# Patient Record
Sex: Male | Born: 1960
Health system: Southern US, Community
[De-identification: ages and names within clinical notes are randomized; demographics above are authoritative.]

## PROBLEM LIST (undated history)

## (undated) DIAGNOSIS — I82409 Acute embolism and thrombosis of unspecified deep veins of unspecified lower extremity: Secondary | ICD-10-CM

## (undated) DIAGNOSIS — E039 Hypothyroidism, unspecified: Secondary | ICD-10-CM

## (undated) DIAGNOSIS — E669 Obesity, unspecified: Secondary | ICD-10-CM

## (undated) DIAGNOSIS — G4730 Sleep apnea, unspecified: Secondary | ICD-10-CM

## (undated) DIAGNOSIS — M1990 Unspecified osteoarthritis, unspecified site: Secondary | ICD-10-CM

## (undated) DIAGNOSIS — N189 Chronic kidney disease, unspecified: Secondary | ICD-10-CM

## (undated) DIAGNOSIS — I480 Paroxysmal atrial fibrillation: Secondary | ICD-10-CM

## (undated) DIAGNOSIS — R112 Nausea with vomiting, unspecified: Secondary | ICD-10-CM

## (undated) DIAGNOSIS — I2699 Other pulmonary embolism without acute cor pulmonale: Secondary | ICD-10-CM

## (undated) DIAGNOSIS — A6920 Lyme disease, unspecified: Secondary | ICD-10-CM

## (undated) DIAGNOSIS — Z9889 Other specified postprocedural states: Secondary | ICD-10-CM

## (undated) DIAGNOSIS — E785 Hyperlipidemia, unspecified: Secondary | ICD-10-CM

## (undated) DIAGNOSIS — D759 Disease of blood and blood-forming organs, unspecified: Secondary | ICD-10-CM

## (undated) DIAGNOSIS — K219 Gastro-esophageal reflux disease without esophagitis: Secondary | ICD-10-CM

## (undated) DIAGNOSIS — F439 Reaction to severe stress, unspecified: Secondary | ICD-10-CM

## (undated) DIAGNOSIS — Z8709 Personal history of other diseases of the respiratory system: Secondary | ICD-10-CM

## (undated) DIAGNOSIS — D6859 Other primary thrombophilia: Secondary | ICD-10-CM

## (undated) DIAGNOSIS — I10 Essential (primary) hypertension: Secondary | ICD-10-CM

## (undated) DIAGNOSIS — I499 Cardiac arrhythmia, unspecified: Secondary | ICD-10-CM

## (undated) DIAGNOSIS — J189 Pneumonia, unspecified organism: Secondary | ICD-10-CM

## (undated) DIAGNOSIS — I5189 Other ill-defined heart diseases: Secondary | ICD-10-CM

## (undated) HISTORY — PX: CARDIOVERSION: SHX1299

## (undated) HISTORY — DX: Reaction to severe stress, unspecified: F43.9

## (undated) HISTORY — DX: Other ill-defined heart diseases: I51.89

## (undated) HISTORY — PX: SHOULDER SURGERY: SHX246

## (undated) HISTORY — DX: Obesity, unspecified: E66.9

## (undated) HISTORY — DX: Hyperlipidemia, unspecified: E78.5

## (undated) HISTORY — PX: WISDOM TOOTH EXTRACTION: SHX21

## (undated) HISTORY — DX: Essential (primary) hypertension: I10

## (undated) HISTORY — DX: Paroxysmal atrial fibrillation: I48.0

---

## 1990-01-17 HISTORY — PX: INGUINAL HERNIA REPAIR: SUR1180

## 1998-09-18 DIAGNOSIS — I82409 Acute embolism and thrombosis of unspecified deep veins of unspecified lower extremity: Secondary | ICD-10-CM

## 1998-09-18 HISTORY — DX: Acute embolism and thrombosis of unspecified deep veins of unspecified lower extremity: I82.409

## 2001-03-31 ENCOUNTER — Observation Stay (HOSPITAL_COMMUNITY): Admission: EM | Admit: 2001-03-31 | Discharge: 2001-04-01 | Payer: Self-pay | Admitting: Emergency Medicine

## 2001-03-31 ENCOUNTER — Encounter: Payer: Self-pay | Admitting: Cardiology

## 2002-09-06 ENCOUNTER — Ambulatory Visit (HOSPITAL_COMMUNITY): Admission: RE | Admit: 2002-09-06 | Discharge: 2002-09-06 | Payer: Self-pay | Admitting: Orthopedic Surgery

## 2002-09-06 ENCOUNTER — Encounter: Payer: Self-pay | Admitting: Orthopedic Surgery

## 2004-01-25 ENCOUNTER — Ambulatory Visit (HOSPITAL_COMMUNITY): Admission: RE | Admit: 2004-01-25 | Discharge: 2004-01-25 | Payer: Self-pay | Admitting: Family Medicine

## 2004-07-08 ENCOUNTER — Inpatient Hospital Stay (HOSPITAL_COMMUNITY): Admission: AD | Admit: 2004-07-08 | Discharge: 2004-07-10 | Payer: Self-pay | Admitting: Cardiology

## 2004-07-09 ENCOUNTER — Encounter (INDEPENDENT_AMBULATORY_CARE_PROVIDER_SITE_OTHER): Payer: Self-pay | Admitting: *Deleted

## 2005-11-06 ENCOUNTER — Ambulatory Visit (HOSPITAL_COMMUNITY): Admission: RE | Admit: 2005-11-06 | Discharge: 2005-11-06 | Payer: Self-pay | Admitting: *Deleted

## 2005-11-06 ENCOUNTER — Encounter: Payer: Self-pay | Admitting: Vascular Surgery

## 2005-11-09 ENCOUNTER — Ambulatory Visit: Admission: RE | Admit: 2005-11-09 | Discharge: 2005-11-09 | Payer: Self-pay | Admitting: Family Medicine

## 2005-11-09 ENCOUNTER — Encounter: Payer: Self-pay | Admitting: Vascular Surgery

## 2006-07-24 ENCOUNTER — Encounter: Admission: RE | Admit: 2006-07-24 | Discharge: 2006-07-24 | Payer: Self-pay | Admitting: Family Medicine

## 2006-08-22 ENCOUNTER — Ambulatory Visit: Payer: Self-pay | Admitting: Vascular Surgery

## 2006-08-25 ENCOUNTER — Ambulatory Visit: Payer: Self-pay | Admitting: Vascular Surgery

## 2006-12-29 ENCOUNTER — Encounter: Admission: RE | Admit: 2006-12-29 | Discharge: 2006-12-29 | Payer: Self-pay | Admitting: Family Medicine

## 2007-05-23 ENCOUNTER — Ambulatory Visit (HOSPITAL_COMMUNITY): Admission: RE | Admit: 2007-05-23 | Discharge: 2007-05-23 | Payer: Self-pay | Admitting: Orthopedic Surgery

## 2009-01-17 HISTORY — PX: DISTAL BICEPS TENDON REPAIR: SHX1461

## 2009-03-29 ENCOUNTER — Ambulatory Visit (HOSPITAL_COMMUNITY): Admission: EM | Admit: 2009-03-29 | Discharge: 2009-03-29 | Payer: Self-pay | Admitting: Emergency Medicine

## 2009-07-17 HISTORY — PX: CARDIOVASCULAR STRESS TEST: SHX262

## 2009-10-23 ENCOUNTER — Ambulatory Visit: Payer: Self-pay | Admitting: Cardiology

## 2009-11-20 ENCOUNTER — Ambulatory Visit: Payer: Self-pay | Admitting: Cardiology

## 2010-02-07 ENCOUNTER — Encounter: Payer: Self-pay | Admitting: Family Medicine

## 2010-02-17 ENCOUNTER — Ambulatory Visit (INDEPENDENT_AMBULATORY_CARE_PROVIDER_SITE_OTHER): Payer: BC Managed Care – PPO | Admitting: *Deleted

## 2010-02-17 DIAGNOSIS — I4891 Unspecified atrial fibrillation: Secondary | ICD-10-CM

## 2010-03-05 ENCOUNTER — Ambulatory Visit: Payer: BC Managed Care – PPO | Admitting: Cardiology

## 2010-03-05 ENCOUNTER — Ambulatory Visit (HOSPITAL_COMMUNITY): Payer: BC Managed Care – PPO | Attending: Cardiology

## 2010-03-05 DIAGNOSIS — R42 Dizziness and giddiness: Secondary | ICD-10-CM

## 2010-03-05 DIAGNOSIS — I10 Essential (primary) hypertension: Secondary | ICD-10-CM | POA: Insufficient documentation

## 2010-03-05 DIAGNOSIS — E785 Hyperlipidemia, unspecified: Secondary | ICD-10-CM | POA: Insufficient documentation

## 2010-03-05 DIAGNOSIS — I4891 Unspecified atrial fibrillation: Secondary | ICD-10-CM

## 2010-03-05 HISTORY — PX: TRANSTHORACIC ECHOCARDIOGRAM: SHX275

## 2010-03-26 ENCOUNTER — Ambulatory Visit (INDEPENDENT_AMBULATORY_CARE_PROVIDER_SITE_OTHER): Payer: BC Managed Care – PPO | Admitting: Cardiology

## 2010-03-26 ENCOUNTER — Other Ambulatory Visit: Payer: Self-pay | Admitting: Cardiology

## 2010-03-26 DIAGNOSIS — Z79899 Other long term (current) drug therapy: Secondary | ICD-10-CM

## 2010-03-26 DIAGNOSIS — I4891 Unspecified atrial fibrillation: Secondary | ICD-10-CM

## 2010-03-26 DIAGNOSIS — E789 Disorder of lipoprotein metabolism, unspecified: Secondary | ICD-10-CM

## 2010-03-26 LAB — COMPREHENSIVE METABOLIC PANEL
ALT: 33 U/L (ref 0–53)
Albumin: 4.5 g/dL (ref 3.5–5.2)
CO2: 28 mEq/L (ref 19–32)
Chloride: 101 mEq/L (ref 96–112)
Creat: 1.49 mg/dL (ref 0.40–1.50)
Sodium: 139 mEq/L (ref 135–145)
Total Bilirubin: 0.8 mg/dL (ref 0.3–1.2)

## 2010-03-26 LAB — LIPID PANEL
Cholesterol: 194 mg/dL (ref 0–200)
LDL Cholesterol: 135 mg/dL — ABNORMAL HIGH (ref 0–99)

## 2010-03-26 LAB — T4, FREE: Free T4: 1.4 ng/dL (ref 0.80–1.80)

## 2010-03-26 LAB — TSH: TSH: 3.463 u[IU]/mL (ref 0.350–4.500)

## 2010-04-11 LAB — CBC
HCT: 48.8 % (ref 39.0–52.0)
MCV: 89.7 fL (ref 78.0–100.0)
RDW: 14.3 % (ref 11.5–15.5)

## 2010-04-11 LAB — DIFFERENTIAL
Eosinophils Absolute: 0.1 10*3/uL (ref 0.0–0.7)
Lymphocytes Relative: 24 % (ref 12–46)
Lymphs Abs: 1.6 10*3/uL (ref 0.7–4.0)
Monocytes Absolute: 0.6 10*3/uL (ref 0.1–1.0)

## 2010-04-11 LAB — BASIC METABOLIC PANEL
Creatinine, Ser: 1.23 mg/dL (ref 0.4–1.5)
Glucose, Bld: 100 mg/dL — ABNORMAL HIGH (ref 70–99)
Potassium: 4.1 mEq/L (ref 3.5–5.1)
Sodium: 133 mEq/L — ABNORMAL LOW (ref 135–145)

## 2010-04-27 ENCOUNTER — Other Ambulatory Visit: Payer: Self-pay | Admitting: *Deleted

## 2010-04-27 MED ORDER — OMEGA-3-ACID ETHYL ESTERS 1 G PO CAPS: 2.0000 g | ORAL_CAPSULE | Freq: Two times a day (BID) | ORAL | Status: DC

## 2010-05-21 ENCOUNTER — Other Ambulatory Visit: Payer: Self-pay | Admitting: General Surgery

## 2010-05-21 ENCOUNTER — Ambulatory Visit (HOSPITAL_COMMUNITY)
Admission: RE | Admit: 2010-05-21 | Discharge: 2010-05-21 | Disposition: A | Discharge status: Home / Self Care | Payer: BC Managed Care – PPO | Source: Ambulatory Visit | Attending: General Surgery | Admitting: General Surgery

## 2010-05-21 ENCOUNTER — Encounter (HOSPITAL_COMMUNITY): Payer: BC Managed Care – PPO

## 2010-05-21 ENCOUNTER — Other Ambulatory Visit (HOSPITAL_COMMUNITY): Payer: Self-pay | Admitting: General Surgery

## 2010-05-21 DIAGNOSIS — Z01818 Encounter for other preprocedural examination: Secondary | ICD-10-CM | POA: Insufficient documentation

## 2010-05-21 DIAGNOSIS — I10 Essential (primary) hypertension: Secondary | ICD-10-CM

## 2010-05-21 DIAGNOSIS — Z01812 Encounter for preprocedural laboratory examination: Secondary | ICD-10-CM | POA: Insufficient documentation

## 2010-05-21 DIAGNOSIS — K4090 Unilateral inguinal hernia, without obstruction or gangrene, not specified as recurrent: Secondary | ICD-10-CM | POA: Insufficient documentation

## 2010-05-21 LAB — COMPREHENSIVE METABOLIC PANEL
ALT: 35 U/L (ref 0–53)
AST: 36 U/L (ref 0–37)
Alkaline Phosphatase: 67 U/L (ref 39–117)
BUN: 30 mg/dL — ABNORMAL HIGH (ref 6–23)
CO2: 29 mEq/L (ref 19–32)
Creatinine, Ser: 1.47 mg/dL (ref 0.4–1.5)
GFR calc Af Amer: >60 mL/min (ref >60)
Glucose, Bld: 93 mg/dL (ref 70–99)
Total Bilirubin: 0.5 mg/dL (ref 0.3–1.2)

## 2010-05-21 LAB — CBC
HCT: 50.1 % (ref 39.0–52.0)
Hemoglobin: 16.7 g/dL (ref 13.0–17.0)
MCHC: 33.3 g/dL (ref 30.0–36.0)
MCV: 86.7 fL (ref 78.0–100.0)
RBC: 5.78 MIL/uL (ref 4.22–5.81)
RDW: 14.2 % (ref 11.5–15.5)
WBC: 4.4 10*3/uL (ref 4.0–10.5)

## 2010-05-21 LAB — DIFFERENTIAL
Lymphocytes Relative: 38 % (ref 12–46)
Monocytes Absolute: 0.4 10*3/uL (ref 0.1–1.0)
Neutro Abs: 2.1 10*3/uL (ref 1.7–7.7)

## 2010-05-21 LAB — SURGICAL PCR SCREEN
MRSA, PCR: NEGATIVE
Staphylococcus aureus: NEGATIVE

## 2010-05-21 LAB — PROTIME-INR: INR: 1.05 (ref 0.00–1.49)

## 2010-05-25 ENCOUNTER — Ambulatory Visit (HOSPITAL_COMMUNITY)
Admission: RE | Admit: 2010-05-25 | Discharge: 2010-05-25 | Disposition: A | Discharge status: Home / Self Care | Payer: BC Managed Care – PPO | Source: Ambulatory Visit | Attending: General Surgery | Admitting: General Surgery

## 2010-05-25 DIAGNOSIS — I4891 Unspecified atrial fibrillation: Secondary | ICD-10-CM | POA: Insufficient documentation

## 2010-05-25 DIAGNOSIS — I10 Essential (primary) hypertension: Secondary | ICD-10-CM | POA: Insufficient documentation

## 2010-05-25 DIAGNOSIS — K4090 Unilateral inguinal hernia, without obstruction or gangrene, not specified as recurrent: Secondary | ICD-10-CM | POA: Insufficient documentation

## 2010-05-26 NOTE — Op Note (Signed)
NAME:  Isaiah [name], Isaiah [name] NO.:  [id number]  MEDICAL RECORD NO.:  [id number]           PATIENT TYPE:  O  LOCATION:  DAYL                         FACILITY:  [facility]  PHYSICIAN:  Isaiah [name], M.D.DATE OF BIRTH:  October 03, 1960  DATE OF PROCEDURE:  05/25/2010 DATE OF DISCHARGE:                              OPERATIVE REPORT   PREOPERATIVE DIAGNOSIS:  Left inguinal hernia.  POSTOPERATIVE DIAGNOSIS:  Indirect left inguinal hernia.  PROCEDURE:  Laparoscopic left inguinal hernia repair with mesh (Parietex 6 inch x 6 inch).  SURGEON:  Isaiah [name], M.D.  ANESTHESIA:  General.  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  He came in feeling he had a left inguinal hernia, by exam indeed he does, and now he presents for left inguinal hernia repair with mesh.  Procedure risks and aftercare were discussed with him preoperatively.  Also, we discussed him withholding any blood thinners he is on.  TECHNIQUE:  He was seen in the holding area and the left groin marked with my initials.  He then voided and was brought to the operating room, placed supine on the operating table and general anesthetic was administered.  The hair in the lower abdominal wall and groin was clipped and the area was sterilely prepped and draped.  Marcaine was infiltrated in the subumbilical region.  A subumbilical incision was made through the skin and subcutaneous tissue.  Using blunt dissection, I identified the left anterior rectus sheath and made a small incision in it.  The underlying left rectus muscle was swept laterally exposing the posterior rectus sheath.  A balloon dissection device was then placed in the extraperitoneal space in the lower midline and left extraperitoneal space was dissected using this device under laparoscopic vision.  The balloon was then  removed and CO2 gas was insufflated creating a working space.  Just to the right of lower midline, two 5-mm trocars were placed.  Using blunt dissection, I identified the symphysis pubis and Cooper's ligament and the direct space on the left side.  I then dissected fibrofatty tissue free from the anterior and lateral abdominal walls and the left extraperitoneal space packed above the level of the umbilicus. I isolated the spermatic cord and noted indirect hernia present through a patulous internal ring.  I reduced large amount of extraperitoneal fat that was out into the hernia.  I then created a window around the spermatic cord.  I stripped the peritoneal sac on the spermatic cord back to the level of the umbilicus.  Following this, I brought the Parietex mesh into the field and cut it to be 5 inches x 6 inches.  A partial longitudinal slit was cut into it. It was then placed into the left extraperitoneal space and then deployed and positioned so that two tails were wrapped around the spermatic cord. Using absorbable tacks, it was anchored to the area just above the Cooper ligament, the anterior and lateral abdominal walls with good fixation.  This provided  for good coverage with a good overlap of the direct, indirect femoral spaces.  Following this, I inspected the area.  No bleeding was noted.  I held the inferolateral aspect of mesh down and then released the CO2 gas watching the extraperitoneal contents approximate the mesh.  All instruments and trocars were then removed.  Marcaine was infiltrated to the extraperitoneal space.  Following this, left anterior rectus sheath defect was closed with interrupted 0 Vicryl sutures.  All skin incisions were closed with 4-0 Monocryl subcuticular stitches.  Steri-Strips and sterile dressings were applied.  He tolerated the procedure without any apparent complications and was taken to recovery in satisfactory condition.  He will be given  some Percocet for pain and discharge instructions and followup in the office in 2-3 weeks.     Isaiah [name], M.D.     Kari Baars  D:  05/25/2010  T:  05/25/2010  Job:  [id number]  cc:   Cassell Clement, M.D. Fax: [phone]  Bryan Lemma. Manus Gunning, M.D. Fax: [phone]  Electronically Signed by Avel Peace M.D. on 05/26/2010 10:40:47 AM

## 2010-06-01 NOTE — Procedures (Signed)
DUPLEX DEEP VENOUS EXAM - LOWER EXTREMITY   INDICATION:  Left leg pain and swelling.  History of superficial  thrombosis.   HISTORY:  Edema:  Yes  Trauma/Surgery:  No  Pain:  Yes  PE:  No  Previous DVT:  No  Anticoagulants:  Other:   DUPLEX EXAM:                CFV   SFV   PopV  PTV    GSV                R  L  R  L  R  L  R   L  R  L  Thrombosis    0  +  +  +  0  +  0  Spontaneous  Phasic  Augmentation  Compressible  Competent   Legend:  + - yes  o - no  p - partial  D - decreased   IMPRESSION:  Acute free-floating deep vein thrombosis noted in the left  common femoral vein.  Thrombosis noted in the left greater saphenous and  lesser saphenous veins.   _____________________________  [name]. Edilia Bo, M.D.   MG/MEDQ  D:  08/22/2006  T:  08/23/2006  Job:  [id number]

## 2010-06-01 NOTE — Consult Note (Signed)
NEW PATIENT CONSULTATION   Isaiah [name], Isaiah [name]  DOB:  1960/09/29                                       08/22/2006  [id number]#:[id number]   VASCULAR SURGERY CONSULTATION   REASON FOR CONSULTATION:  I saw Isaiah [name] in the office today as an  add-on consult with a clot in his left greater saphenous vein extending  to his left common femoral vein.  He was referred by Dr. Patty Sermons.  This is a pleasant 50 year old gentleman who has had two previous  episodes of phlebitis involving the distal leg.  In October of 2007 he  had phlebitis in the left ankle and then in June of 2008 had phlebitis  of the right ankle.  He has had no previous history of DVT.  He  developed left leg pain last Thursday and this persisted.  He spoke to  Dr. Yevonne Pax office today who arranged for venous duplex of the left  leg today.  The left leg venous duplex demonstrated clot involving the  entire left greater saphenous vein with some clot extending into the  common femoral vein.  I was consulted for further recommendations  concerning the clot.  The patient denies any chest pain or shortness of  breath.  He has had some mild left leg swelling.  His main complaint has  been pain along the medial aspect of his left leg.  He has had no  symptoms in the right leg.   PAST MEDICAL HISTORY:  His past medical history is significant for  atrial fibrillation and he is followed by Dr. Patty Sermons.  He has not  been on Coumadin.  He was also apparently recently diagnosed with  hyperthyroidism and is followed by Dr. Evlyn Kanner.  In addition, he does have  hypercholesterolemia.  He has no history of diabetes, hypertension,  history of previous myocardial infarction, history of congestive heart  failure or history of COPD.   PRIMARY CARE PHYSICIANS:  Bryan Lemma. Manus Gunning, M.D. and Donia Guiles,  M.D.   FAMILY HISTORY:  There is no history of premature cardiovascular  disease.  There is no history of clotting  disorders that he is aware of.   SOCIAL HISTORY:  He is married and has two children.  He works as a  Education officer, community. He does not do a lot of standing.  He is either walking between  rooms or sitting.  He does not smoke cigarettes.   REVIEW OF SYSTEMS AND MEDICATIONS:  Are documented on the medical  history form in his chart.   PHYSICAL EXAMINATION:  Vital signs:  Blood pressure 145/75.  Heart rate  is 75.  Neck:  I do not detect any carotid bruits.  Lungs:  Clear  bilaterally to auscultation.  Cardiac:  On exam he has a regular rate  and rhythm.  Abdomen:  Soft and nontender.  Extremities: He has palpable  femoral, popliteal and pedal pulses bilaterally.  He has mild left lower  extremity swelling.  He has some mild erythema along the medial aspect  of his left thigh with some induration along the medial aspect of his  thigh; also, to a lesser degree, in his calf.   CLINICAL DATA:  I did review his duplex scan and he does have clot  involving the left greater saphenous vein with a small amount of clot  extending into the common  femoral vein.  I looked at this with the tech  and on my interpretation, although there is some slight mobility to this  clot, it appears to be fairly well stuck to the common femoral vein.  There is good flow around the clot.   DISCUSSION:  I think given the propagation of the clot in the greater  saphenous vein he clearly needs to be on Coumadin and I have written him  a prescription for Lovenox to bridge the Coumadin.  I have discussed  this with Dr. Manus Gunning, who will call him tomorrow to arrange for  followup Pro Time/INR to help get him converted to Coumadin.  I believe  Dr. Manus Gunning has previously performed a hypercoagulable work up.  I have  also arranged for a followup duplex scan on Friday to be sure there is  no evidence of propagation of the clot.  The only consideration would be  whether or not to place an IVC filter.  I explained to him that there is   really no right answer here.  The risks of not placing a filter is the  small chance that this clot could break loose and he could have a  pulmonary embolus.  Again, on my interpretation, the clot looked  reasonably well stuck to the wall of the common femoral vein and it was  small enough that I did not think that this was would be a significant  PE, if it did break loose.  I have explained that the other option would  be to place an IVC filter, however, there was also some small risk  associated with this including the risk of PE around the filter, IVC  thrombosis and filter malposition. He is agreeable with plans to hold  off on the filter.   He will begin his Lovenox tonight and then subsequently will begin his  Coumadin.  He will have his INR managed by Dr. Manus Gunning and will have a  followup duplex scan on Friday.  In addition, I  have left a message with Dr. Rinaldo Cloud nurse, as he had discussed he might  have to adjust his thyroid medications if he was placed on Coumadin.   [name]. Edilia Bo, M.D.  Electronically Signed   CSD/MEDQ  D:  08/22/2006  T:  08/23/2006  Job:  [id number]   cc:   Bryan Lemma. Manus Gunning, M.D.  Tera Mater. Evlyn Kanner, M.D.  Cassell Clement, M.D.

## 2010-06-01 NOTE — Procedures (Signed)
DUPLEX DEEP VENOUS EXAM - LOWER EXTREMITY   INDICATION:  Follow up known DVT in the left leg.   HISTORY:  Edema:  Yes.  Trauma/Surgery:  No.  Pain:  Yes.  PE:  No.  Previous DVT:  Yes.  Anticoagulants:  Other:   DUPLEX EXAM:                CFV   SFV   PopV  PTV    GSV                R  L  R  L  R  L  R   L  R  L  Thrombosis    0  +     0  +  0  +   0  +  0  Spontaneous   0  0  0  0  0  0  0   0  0  0  Phasic        0  0  0  0  0  0  0   0  0  0  Augmentation  0  0  0  0  0  0  0   0  0  0  Compressible  0  0  0  0  0  0  0   0  0  0  Competent     0  0  0  0  0  0  0   0  0  0   Legend:  + - yes  o - no  p - partial  D - decreased   IMPRESSION:  Acute free-floating DVT noted in the left common femoral  vein.  Thrombosis noted in the left greater saphenous and lesser  saphenous vein.  Study unchanged from previous.   _____________________________  [name]. Edilia Bo, M.D.   MG/MEDQ  D:  08/25/2006  T:  08/26/2006  Job:  [id number]

## 2010-06-04 ENCOUNTER — Encounter (INDEPENDENT_AMBULATORY_CARE_PROVIDER_SITE_OTHER): Payer: Self-pay | Admitting: General Surgery

## 2010-06-04 NOTE — Discharge Summary (Signed)
NAME:  Isaiah [name], Isaiah [name] NO.:  [id number]   MEDICAL RECORD NO.:  [id number]          PATIENT TYPE:  INP   LOCATION:  4702                         FACILITY:  [facility]   PHYSICIAN:  Elmore Guise., M.D.DATE OF BIRTH:  Jan 09, 1961   DATE OF ADMISSION:  07/08/2004  DATE OF DISCHARGE:  07/10/2004                                 DISCHARGE SUMMARY   DISCHARGE DIAGNOSIS:  1.  Atrial fibrillation (status post transesophageal echocardiogram/direct      current cardioversion).  2.  Dyslipidemia.   HISTORY OF PRESENT ILLNESS:  The patient is very pleasant, 50 year old,  white male with past medical history of dyslipidemia, who was admitted on  July 08, 2004, because of recurrent atrial fibrillation.   HOSPITAL COURSE:  His hospital course was uncomplicated. He underwent  successful DC cardioversion on July 09, 2004, after TEE was performed. TEE  showed mild mitral regurgitation with normal LV systolic function and EF of  50-55%. Normal atrial sizes was noted. He has now been in normal sinus  rhythm for the last 24 hours and will be discharged home today. His INR on  discharge is 2.1. Prior to his discharge, he did have IV infiltration in his  right hand. His IV was removed and ice pack was placed with improvement in  his symptoms. There is no evidence of infection there.   DISCHARGE MEDICATIONS:  1.  Tylenol or ibuprofen as needed for his pain.  2.  He will take Toprol XL 25 mg daily.  3.  Zetia 10 mg daily.  4.  Xanax 0.25 mg q.6h. p.r.n.  5.  Coumadin 5 mg daily.  6.  Amiodarone 200 mg twice a day for two weeks and then 200 mg once a day.   I did ask him to use warm compresses to his right hand to help with pain. He  will return to Dr. Ronny Flurry in one week for routine office visit and  PT/INR. He will notify the office should he have any further problems or  concerns.  A telephone number was given. We did discuss that he would need his Coumadin  for least four  to six weeks following his successful cardioversion. Once he  is off amiodarone, he would be a good candidate for pill in the pocket  treatment.       TWK/MEDQ  D:  07/10/2004  T:  07/11/2004  Job:  [id number]   cc:   Cassell Clement, M.D.  [address]  [place]  [place] [address]  Fax: [phone]

## 2010-06-04 NOTE — H&P (Signed)
[facility]  Patient:    Isaiah [name], Isaiah [name] Visit Number: [id number] MRN: [id number]          Service Type: MED Location: 1800 [id number] [id number] Attending Physician:  Hanley Seamen Dictated by:   Clovis Pu Patty Sermons, M.D. Admit Date:  03/31/2001   CC:         Desma Maxim, M.D.   History and Physical  CHIEF COMPLAINT:  Atrial fibrillation.  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 He has no prior history of any known arrhythmias.  The palpitations were barely noticeable and really did not cause him any other symptoms.  This morning, he got up and worked out with his weights in his home gymnasium and did not have any adverse effects from that.  However, he continued to be aware that his pulse was a little irregular and he asked his wife to count his pulse at the carotid and she confirmed that it was quite irregular.  The patient then came to the emergency room for further evaluation.  He has not had any definite chest pain.  There is just a sense of uneasiness in his chest.  He has not been short of breath.  He has been in excellent health otherwise.  He takes no prescription medications.  He sees Dr. Donia Guiles basically for annual physicals.  The patient, although he takes no prescription medications, does take some over-the-counter medications including creatine and vitamin supplements and he does take a baby aspirin per day.  FAMILY HISTORY:  Both parents are living and well.  There is no family history of atrial fibrillation.  His maternal grandfather died at age 44 of a massive heart attack.  The patients mother has high blood pressure, but no heart problems that they know of.  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  The patient does not use any tobacco  products. He will drink an occasional cocktail and last night had two margaritas.  The patient is married and has a 21-month-old daughter and a 38-year-old daughter.  PAST SURGICAL HISTORY:  Inguinal herniorrhaphy in 1992.  ALLERGIES:  No known drug allergies.  REVIEW OF SYSTEMS:  GASTROINTESTINAL:  Negative.  GENITOURINARY:  Negative. RESPIRATORY:  Negative.  NEUROLOGIC:  No history of TIA or strokes and no headaches or dizziness.  The remainder of review of systems is negative in detail.  PHYSICAL EXAMINATION:  VITAL SIGNS:  Stable with pulse 100 in atrial fibrillation, respiratory rate normal.  HEENT:  Negative.  Pupils are equal, round and reactive to light and accommodation.  Extraocular movements full.  Sclerae clear.  Mouth and pharynx are normal.  NECK:  Carotid normal.  Thyroid normal.  No lymphadenopathy.  CHEST:  Clear to percussion and auscultation.  HEART:  No murmur, gallop, rub or click.  Precordium is quiet.  ABDOMEN:  Soft without hepatosplenomegaly or masses.  GENITALIA:  Not examined.  EXTREMITIES:  Good pulses with no edema.  LABORATORY DATA AND X-RAY FINDINGS:  Atrial fibrillation with a rapid ventricular response and a good initial response to IV Cardizem.  DISPOSITION:  Await results of chest x-ray and labs.  Will check him especially for thyrotoxicosis.  IMPRESSION:  New-onset of atrial fibrillation, uncertain etiology, doubt coronary artery disease.  PLAN: 1. Admit to telemetry. 2. We will continue low-dose  intravenous Cardizem and also add oral Lopressor. 3. We will given him Lovenox and we will continue with his daily aspirin. 4. Serial enzymes will be obtained to be sure that he did not have a silent    myocardial infarction. Dictated by:   Clovis Pu Patty Sermons, M.D. Attending Physician:  Hanley Seamen DD:  03/31/01 TD:  03/31/01 Job: [id number]

## 2010-06-04 NOTE — H&P (Signed)
NAME:  Isaiah [name], Isaiah [name] NO.:  [id number]   MEDICAL RECORD NO.:  [id number]          PATIENT TYPE:  INP   LOCATION:  4702                         FACILITY:  [facility]   PHYSICIAN:  Cassell Clement, M.D. DATE OF BIRTH:  08-16-1960   DATE OF ADMISSION:  07/08/2004  DATE OF DISCHARGE:                                HISTORY & PHYSICAL   CHIEF COMPLAINT:  Paroxysmal atrial fibrillation.   HISTORY OF PRESENT ILLNESS:  This is a 50 year old married Caucasian  gentleman who is admitted because of new onset of paroxysmal atrial  fibrillation. We first saw this patient in March of 2003 when he presented  with palpitations and was found to be in atrial fibrillation with rapid  ventricular response and was [facility]. The patient  was admitted to telemetry and was given a low dose intravenous Cardizem and  placed on beta blockers and Lovenox with anticipation of possible  cardioversion but converted overnight and was able to be discharged improved  the next day. He is remained on Toprol. He had normal cardiac enzymes during  that admission and was discharged with instructions to avoid caffeine and  limit alcohol. He had a subsequent treadmill Cardiolite stress test in  October 2003 showing no evidence of ischemia and showing ejection fraction  of 61%. The patient had a echocardiogram April 13, 2001 which was a normal  study with normal LV function. His most recent echocardiogram was done Jun 11, 2004 showing ejection fraction of 55%, left atrial size 32 mm which is  normal and showing normal systolic and diastolic function, mild mitral  regurgitation, normal pulmonary artery pressure. Approximately a month ago,  the patient was in [facility] and went into atrial fibrillation and was  hospitalized briefly in [place] and then released and came back to  [place]. When returning to [facility], he was still in atrial  fibrillation but reverted after  the next several days. He was not  anticoagulated at that time. Digoxin was added to his regimen, but he did  not tolerate it because it caused the forceful heartbeat which was  uncomfortable for him. Recently, he had been on 50 milligrams Toprol twice a  day, aspirin 325 milligrams daily and multivitamin as well as Zetia 10  milligrams daily. Two days ago on July 06, 2004, the patient noted symptoms  on the morning of July 06, 2004 consistent with recurrent atrial  fibrillation. He was instructed to take additional propranolol at home every  4 hours until he converted which he took but failed to convert and was seen  in our office on July 07, 2004 at which time he was still in atrial  fibrillation with a rate of 100 and he was given Cardizem LA 360 to take  once a day and was also started on Coumadin. He was instructed that if he  had not converted by this morning he was notify us which he did and  therefore is now admitted for inpatient management including Lovenox  bridging to Coumadin and IV amiodarone.   FAMILY HISTORY:  His family history is notable in that his mother is living  at 90. Father living at 87 and in good health. He did have a maternal  grandfather died of the acute massive heart attack at age 24.   SOCIAL HISTORY:  He is married. 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 He has to daughters ages four and 71. The patient has never  smoked. He drinks very little alcohol, usually just some beers on a week and  occasionally in the evening.   ALLERGIES:  He had developed a marked elevation of liver function tests to  CRESTOR and he developed myalgias on LIPITOR.   REVIEW OF SYSTEMS:  GENERAL:  Reveals has not had any change in bowel  habits, hematochezia, or melena. Denies any genitourinary symptoms.  RESPIRATORY:  Reveals that he is had a history of having had a bad cold  about two months ago which finally cleared. Remainder of review of systems  is  negative in detail. As noted, he does have a history of  hypercholesterolemia. The patient tries to get regular exercise. 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 Previously, he had been  concentrating on isometric body building exercises but now he is also trying  to focus more on cardiovascular and aerobic exercise as well.   PHYSICAL EXAMINATION:  VITAL SIGNS:  His blood pressure is 109/70, pulse is  80 and irregularly irregular, respirations 16, temperature 97.  GENERAL APPEARANCE:  Well-developed, well-nourished gentleman in no  distress.  SKIN:  The skin is clear.  HEENT:  The pupils were equal and reactive. Extraocular movements are full.  The mouth and pharynx are normal.  NECK:  The carotids have normal upstroke. The jugular venous pressure is  normal. Thyroid is normal. There is no lymphadenopathy.  CHEST:  The chest is clear to percussion and auscultation.  HEART:  Reveals a quiet precordium without murmur, gallop or click.  ABDOMEN:  Abdomen is soft without hepatosplenomegaly or masses.  EXTREMITIES:  Show good peripheral pulses. No edema or phlebitis.   IMPRESSION:  1.  Paroxysmal atrial fibrillation, recurrent.  2.  History of hypercholesterolemia.   DISPOSITION:  We will continue his Toprol for rate control and we are  starting IV amiodarone and starting Lovenox until his Coumadin is  therapeutic. He is scheduled for TEE cardioversion at 12 noon tomorrow by  Dr. Lady Deutscher.       TB/MEDQ  D:  07/08/2004  T:  07/08/2004  Job:  [id number]

## 2010-06-04 NOTE — Discharge Summary (Signed)
[place]  Patient:    RALLY, OUCH Visit Number: [id number] MRN: [id number]          Service Type: MED Location: 2000 [id number] [id number] Attending Physician:  Rudean Hitt Dictated by:   Clovis Pu Patty Sermons, M.D. Admit Date:  03/31/2001 Discharge Date: 04/01/2001   CC:         Desma Maxim, M.D.   Discharge Summary  FINAL DIAGNOSIS:  Paroxysmal atrial fibrillation, resolved.  OPERATIONS PERFORMED:  None.  HISTORY:  This 50 year old married gentleman from [facility] noted onset of palpitations on the history of prior to admission.  There is no history of any known heart problems.  He has been in good health and sees Dr. Donia Guiles for physicals.  He takes no prescription medicines but has been on a lot of vitamin supplements and creatine.  FAMILY HISTORY:  Negative for atrial fibrillation.  SOCIAL HISTORY:  He drinks a little alcohol and does not use tobacco.  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  PHYSICAL EXAMINATION:  VITAL SIGNS:  Pulse 100 in atrial fibrillation.  HEAD AND NECK:  Negative.  HEART:  No murmur, gallop, or rub.  ABDOMEN:  Negative.  CHEST:  Clear.  EXTREMITIES:  No edema.  DIAGNOSTIC DATA:  Electrocardiogram showed atrial fibrillation with a rapid ventricular response and good initial response to IV Cardizem.  HOSPITAL COURSE:  He was admitted to telemetry.  He was placed on intravenous low-dose Cardizem drip and oral Lopressor.  He was given Lovenox prophylactically and continued on daily aspirin.  Serial enzymes were obtained to rule out a myocardial infarction.  By the next day, the patient had converted back to sinus rhythm.  He was feeling well.  His cardiac enzymes came back normal, and he was anxious to go home.  His total CKs were elevated secondary to creatine ingestion most likely, and his troponin Is were normal.  He was discharged home  with instructions to avoid caffeine and limit alcohol. He will be on Toprol XL 50 mg daily and an aspirin daily.  Will plan for an outpatient 2-D echocardiogram and outpatient Cardiolite stress test at a later date.  CONDITION UPON DISCHARGE:  Improved, satisfactory. Dictated by:   Clovis Pu Patty Sermons, M.D. Attending Physician:  Rudean Hitt DD:  04/27/01 TD:  04/29/01 Job: [id number]

## 2010-07-16 ENCOUNTER — Other Ambulatory Visit: Payer: Self-pay | Admitting: *Deleted

## 2010-07-16 DIAGNOSIS — E7800 Pure hypercholesterolemia, unspecified: Secondary | ICD-10-CM

## 2010-07-16 DIAGNOSIS — F419 Anxiety disorder, unspecified: Secondary | ICD-10-CM

## 2010-07-16 MED ORDER — EZETIMIBE 10 MG PO TABS: 10.0000 mg | ORAL_TABLET | Freq: Every day | ORAL | Status: DC

## 2010-07-16 MED ORDER — ALPRAZOLAM 0.25 MG PO TABS: 0.2500 mg | ORAL_TABLET | Freq: Every evening | ORAL | Status: DC | PRN

## 2010-07-16 NOTE — Telephone Encounter (Signed)
Refilled meds per fax request.  

## 2010-09-24 ENCOUNTER — Other Ambulatory Visit: Payer: Self-pay | Admitting: *Deleted

## 2010-09-24 DIAGNOSIS — E7800 Pure hypercholesterolemia, unspecified: Secondary | ICD-10-CM

## 2010-09-24 DIAGNOSIS — I4891 Unspecified atrial fibrillation: Secondary | ICD-10-CM

## 2010-09-24 DIAGNOSIS — Z79899 Other long term (current) drug therapy: Secondary | ICD-10-CM

## 2010-09-24 MED ORDER — METOPROLOL SUCCINATE ER 50 MG PO TB24: 50.0000 mg | ORAL_TABLET | Freq: Every day | ORAL | Status: DC

## 2010-09-24 NOTE — Telephone Encounter (Signed)
Refilled meds per fax request.  

## 2010-09-30 ENCOUNTER — Encounter: Payer: Self-pay | Admitting: Nurse Practitioner

## 2010-10-01 ENCOUNTER — Ambulatory Visit (INDEPENDENT_AMBULATORY_CARE_PROVIDER_SITE_OTHER): Payer: BC Managed Care – PPO | Admitting: Nurse Practitioner

## 2010-10-01 ENCOUNTER — Other Ambulatory Visit (INDEPENDENT_AMBULATORY_CARE_PROVIDER_SITE_OTHER): Payer: BC Managed Care – PPO | Admitting: *Deleted

## 2010-10-01 ENCOUNTER — Encounter: Payer: Self-pay | Admitting: Nurse Practitioner

## 2010-10-01 VITALS — BP 152/104 | HR 64 | Ht 75.5 in | Wt 246.2 lb

## 2010-10-01 DIAGNOSIS — Z79899 Other long term (current) drug therapy: Secondary | ICD-10-CM

## 2010-10-01 DIAGNOSIS — I10 Essential (primary) hypertension: Secondary | ICD-10-CM

## 2010-10-01 DIAGNOSIS — I4891 Unspecified atrial fibrillation: Secondary | ICD-10-CM

## 2010-10-01 DIAGNOSIS — E7800 Pure hypercholesterolemia, unspecified: Secondary | ICD-10-CM

## 2010-10-01 DIAGNOSIS — I480 Paroxysmal atrial fibrillation: Secondary | ICD-10-CM | POA: Insufficient documentation

## 2010-10-01 LAB — BASIC METABOLIC PANEL
CO2: 30 mEq/L (ref 19–32)
Glucose, Bld: 104 mg/dL — ABNORMAL HIGH (ref 70–99)
Sodium: 140 mEq/L (ref 135–145)

## 2010-10-01 LAB — LIPID PANEL
Cholesterol: 184 mg/dL (ref 0–200)
HDL: 53.4 mg/dL (ref >39.00)
LDL Cholesterol: 122 mg/dL — ABNORMAL HIGH (ref 0–99)
VLDL: 9 mg/dL (ref 0.0–40.0)

## 2010-10-01 LAB — HEPATIC FUNCTION PANEL
AST: 45 U/L — ABNORMAL HIGH (ref 0–37)
Alkaline Phosphatase: 62 U/L (ref 39–117)
Total Protein: 7.1 g/dL (ref 6.0–8.3)

## 2010-10-01 MED ORDER — LISINOPRIL 20 MG PO TABS: 20.0000 mg | ORAL_TABLET | Freq: Two times a day (BID) | ORAL | Status: DC

## 2010-10-01 MED ORDER — AMLODIPINE BESYLATE 5 MG PO TABS: 5.0000 mg | ORAL_TABLET | Freq: Every day | ORAL | Status: DC

## 2010-10-01 NOTE — Progress Notes (Signed)
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 Patty Sermons. He has known HTN and PAF. His blood pressure is up at home. He has been taking his Lisinopril two times a day and hasn't really seen any improvement. He is under a lot of stress with his divorce and custody issues. He is exercising. He is frustrated that he can't get his heart rate up with exercise but has no sexual side effects. No chest pain. No shortness of breath.  Current Outpatient Prescriptions on File Prior to Visit  Medication Sig Dispense Refill  . ALPRAZolam (XANAX) 0.25 MG tablet Take 0.25 mg by mouth 2 (two) times daily.        Marland Kitchen aspirin 325 MG tablet Take 325 mg by mouth daily.        Marland Kitchen ezetimibe (ZETIA) 10 MG tablet Take 1 tablet (10 mg total) by mouth daily.  30 tablet  11  . levothyroxine (SYNTHROID) 200 MCG tablet Take 200 mcg by mouth daily.        . metoprolol (TOPROL-XL) 50 MG 24 hr tablet Take 1 tablet (50 mg total) by mouth daily.  30 tablet  11  . Multiple Vitamin (MULTIVITAMIN) tablet Take 1 tablet by mouth daily.        Marland Kitchen omega-3 acid ethyl esters (LOVAZA) 1 G capsule Take 2 capsules (2 g total) by mouth 2 (two) times daily.  120 capsule  11  . propafenone (RYTHMOL SR) 325 MG 12 hr capsule Take 325 mg by mouth 2 (two) times daily.        Marland Kitchen DISCONTD: lisinopril (PRINIVIL,ZESTRIL) 20 MG tablet Take 20 mg by mouth daily.        Marland Kitchen ALPRAZolam (XANAX) 0.25 MG tablet Take 1 tablet (0.25 mg total) by mouth at bedtime as needed for sleep.  100 tablet  2    Allergies  Allergen Reactions  . Crestor (Rosuvastatin Calcium)   . Lipitor (Atorvastatin Calcium)     Past Medical History  Diagnosis Date  . PAF (paroxysmal atrial fibrillation)     no recurrence since Feb 2012  . Hypertension   . Thyroid disease   . Hyperlipidemia   . Situational stress   . Diastolic dysfunction     Grade I per echo in 2/12    Past  Surgical History  Procedure Date  . Inguinal hernia repair 1992  . Cardiovascular stress test 07/17/2009    EF 59%  . Transthoracic echocardiogram 03/05/2010    EF 60-65%    History  Smoking status  . Never Smoker   Smokeless tobacco  . Not on file    History  Alcohol Use  . Yes    4X WEEK    Family History  Problem Relation Age of Onset  . Hypertension Mother   . Hyperlipidemia Mother   . Heart attack Maternal Grandfather     Review of Systems: The review of systems is positive for situational stress. He does have some extra salt use.  All other systems were reviewed and are negative.  Physical Exam: BP 152/104  Pulse 64  Ht 6' 3.5" (1.918 m)  Wt 246 lb 3.2 oz (111.676 kg)  BMI 30.37 kg/m2 Patient is pleasant and in no acute distress. Skin is warm and dry. Color is normal.  HEENT is unremarkable. Normocephalic/atraumatic. PERRL. Sclera are nonicteric. Neck is supple. No masses. No JVD. Lungs  are clear. Cardiac exam shows a regular rate and rhythm. Abdomen is soft. Extremities are without edema. Gait and ROM are intact. No gross neurologic deficits noted.   LABORATORY DATA:   Assessment / Plan:

## 2010-10-01 NOTE — Patient Instructions (Signed)
Increase the Lisinopril to two times a day We are adding Norvasc 5 mg daily Both prescriptions are sent to the drug store I will see you in 3 weeks. Continue to monitor your blood pressure at home Call for any problems.

## 2010-10-01 NOTE — Assessment & Plan Note (Signed)
Blood pressure is up. He has already increased his Lisinopril to BID. We will add Norvasc 5 mg daily. He is wanting to get off Toprol but for now will need to continue. He would be a candidate for HCTZ. I will see him back in 3 weeks. He will continue to monitor at home. Patient is agreeable to this plan and will call if any problems develop in the interim.

## 2010-10-01 NOTE — Assessment & Plan Note (Signed)
He remains on Rythmol. No recurrence of his atrial fib.

## 2010-10-01 NOTE — Assessment & Plan Note (Signed)
No recurrence. 

## 2010-10-06 ENCOUNTER — Telehealth: Payer: Self-pay | Admitting: *Deleted

## 2010-10-06 NOTE — Telephone Encounter (Signed)
Advised of labs 

## 2010-10-06 NOTE — Telephone Encounter (Signed)
Message copied by Eugenia Pancoast on Wed Oct 06, 2010  5:24 PM ------      Message from: Cassell Clement      Created: Sat Oct 02, 2010  4:14 PM       Please report.A total cholesterol is 184 which is satisfactory and the LDL cholesterol is improved but still high at 122.  The triglycerides are normal at 45. The liver tests are normal except slight elevation of AST at 45.The serum creatinine is slightly high at 1.6.   The potassium is slightly elevated at 5.4.  Avoid high potassium foods.  Try to drink more water.  Continue prudent diet and same meds.

## 2010-10-21 ENCOUNTER — Other Ambulatory Visit: Payer: Self-pay | Admitting: *Deleted

## 2010-10-21 DIAGNOSIS — F419 Anxiety disorder, unspecified: Secondary | ICD-10-CM

## 2010-10-21 MED ORDER — ALPRAZOLAM 0.25 MG PO TABS: 0.2500 mg | ORAL_TABLET | Freq: Two times a day (BID) | ORAL | Status: DC

## 2010-10-21 NOTE — Telephone Encounter (Signed)
Refilled alprazolam 

## 2010-10-22 ENCOUNTER — Other Ambulatory Visit: Payer: BC Managed Care – PPO | Admitting: *Deleted

## 2010-10-22 ENCOUNTER — Ambulatory Visit (INDEPENDENT_AMBULATORY_CARE_PROVIDER_SITE_OTHER): Payer: BC Managed Care – PPO | Admitting: Nurse Practitioner

## 2010-10-22 ENCOUNTER — Encounter: Payer: Self-pay | Admitting: Nurse Practitioner

## 2010-10-22 VITALS — BP 130/90 | HR 72 | Ht 75.5 in | Wt 248.0 lb

## 2010-10-22 DIAGNOSIS — I480 Paroxysmal atrial fibrillation: Secondary | ICD-10-CM

## 2010-10-22 DIAGNOSIS — I10 Essential (primary) hypertension: Secondary | ICD-10-CM

## 2010-10-22 DIAGNOSIS — I4891 Unspecified atrial fibrillation: Secondary | ICD-10-CM

## 2010-10-22 NOTE — Assessment & Plan Note (Signed)
No recurrence. 

## 2010-10-22 NOTE — Assessment & Plan Note (Signed)
Blood pressure has come down. No change in his medicines today. I have asked him to continue to monitor his blood pressure at home. He is to see Dr. Patty [name] in November. May need BMET on return. Patient is agreeable to this plan and will call if any problems develop in the interim.

## 2010-10-22 NOTE — Progress Notes (Signed)
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 He is seen for Dr. Patty [name]. He is doing well. We added Norvasc to his regimen 3 weeks ago for better blood pressure control. Blood pressure has come down at home. Was in the 120's. He feels good. Worked out today without problems. Stress remains an issue.  Current Outpatient Prescriptions on File Prior to Visit  Medication Sig Dispense Refill  . ALPRAZolam (XANAX) 0.25 MG tablet Take 1 tablet (0.25 mg total) by mouth 2 (two) times daily.  100 tablet  0  . amLODipine (NORVASC) 5 MG tablet Take 1 tablet (5 mg total) by mouth daily.  30 tablet  11  . aspirin 325 MG tablet Take 325 mg by mouth daily.        Marland Kitchen ezetimibe (ZETIA) 10 MG tablet Take 1 tablet (10 mg total) by mouth daily.  30 tablet  11  . levothyroxine (SYNTHROID) 200 MCG tablet Take 200 mcg by mouth daily.        Marland Kitchen lisinopril (PRINIVIL,ZESTRIL) 20 MG tablet Take 1 tablet (20 mg total) by mouth 2 (two) times daily.  60 tablet  3  . metoprolol (TOPROL-XL) 50 MG 24 hr tablet Take 1 tablet (50 mg total) by mouth daily.  30 tablet  11  . Multiple Vitamin (MULTIVITAMIN) tablet Take 1 tablet by mouth daily.        Marland Kitchen omega-3 acid ethyl esters (LOVAZA) 1 G capsule Take 2 capsules (2 g total) by mouth 2 (two) times daily.  120 capsule  11  . propafenone (RYTHMOL SR) 325 MG 12 hr capsule Take 325 mg by mouth 2 (two) times daily.        Marland Kitchen ALPRAZolam (XANAX) 0.25 MG tablet Take 1 tablet (0.25 mg total) by mouth at bedtime as needed for sleep.  100 tablet  2    Allergies  Allergen Reactions  . Crestor (Rosuvastatin Calcium)   . Lipitor (Atorvastatin Calcium)     Past Medical History  Diagnosis Date  . PAF (paroxysmal atrial fibrillation)     no recurrence since Feb 2012  . Hypertension   . Thyroid disease   . Hyperlipidemia   . Situational stress   . Diastolic dysfunction     Grade I per echo in 2/12    Past  Surgical History  Procedure Date  . Inguinal hernia repair 1992  . Cardiovascular stress test 07/17/2009    EF 59%  . Transthoracic echocardiogram 03/05/2010    EF 60-65%    History  Smoking status  . Never Smoker   Smokeless tobacco  . Not on file    History  Alcohol Use  . Yes    4X WEEK    Family History  Problem Relation Age of Onset  . Hypertension Mother   . Hyperlipidemia Mother   . Heart attack Maternal Grandfather     Review of Systems: The review of systems is positive for continued situational stress.  All other systems were reviewed and are negative.  Physical Exam: BP 130/90  Pulse 72  Ht 6' 3.5" (1.918 m)  Wt 248 lb (112.492 kg)  BMI 30.59 kg/m2 Patient is very pleasant and in no acute distress. He is very muscular. Skin is warm and dry. Color is normal.  HEENT is unremarkable. Normocephalic/atraumatic. PERRL. Sclera are nonicteric. Neck is supple. No masses. No JVD. Lungs are clear. Cardiac exam shows a  regular rate and rhythm. Abdomen is soft. Extremities are without edema. Gait and ROM are intact. No gross neurologic deficits noted.   LABORATORY DATA:   Assessment / Plan:

## 2010-10-22 NOTE — Patient Instructions (Signed)
Continue with your current medicines. Monitor your blood pressure at home.  Record your readings and bring to your next visit. Limit sodium intake. Call for any problems.   Keep your next appointment with Dr. Patty Sermons

## 2010-10-28 ENCOUNTER — Other Ambulatory Visit: Payer: Self-pay | Admitting: *Deleted

## 2010-12-06 ENCOUNTER — Other Ambulatory Visit: Payer: Self-pay | Admitting: *Deleted

## 2010-12-06 DIAGNOSIS — F419 Anxiety disorder, unspecified: Secondary | ICD-10-CM

## 2010-12-06 MED ORDER — ALPRAZOLAM 0.25 MG PO TABS: 0.2500 mg | ORAL_TABLET | Freq: Two times a day (BID) | ORAL | Status: DC

## 2010-12-06 NOTE — Telephone Encounter (Signed)
reguest refill alprazolam 0.25mg  number 100 x 2refills

## 2010-12-17 ENCOUNTER — Encounter: Payer: Self-pay | Admitting: Cardiology

## 2010-12-17 ENCOUNTER — Ambulatory Visit (INDEPENDENT_AMBULATORY_CARE_PROVIDER_SITE_OTHER): Payer: BC Managed Care – PPO | Admitting: Cardiology

## 2010-12-17 VITALS — BP 120/84 | HR 80 | Ht 75.0 in | Wt 247.0 lb

## 2010-12-17 DIAGNOSIS — I119 Hypertensive heart disease without heart failure: Secondary | ICD-10-CM

## 2010-12-17 DIAGNOSIS — E039 Hypothyroidism, unspecified: Secondary | ICD-10-CM

## 2010-12-17 DIAGNOSIS — I4891 Unspecified atrial fibrillation: Secondary | ICD-10-CM

## 2010-12-17 DIAGNOSIS — I10 Essential (primary) hypertension: Secondary | ICD-10-CM

## 2010-12-17 DIAGNOSIS — I480 Paroxysmal atrial fibrillation: Secondary | ICD-10-CM

## 2010-12-17 MED ORDER — METOPROLOL SUCCINATE ER 25 MG PO TB24: 25.0000 mg | ORAL_TABLET | Freq: Every day | ORAL | Status: DC

## 2010-12-17 NOTE — Assessment & Plan Note (Signed)
His hypothyroidism is followed by Dr. Evlyn Kanner.  He is clinically euthyroid

## 2010-12-17 NOTE — Progress Notes (Signed)
[name] Date of Birth:  Jul 27, 1960 [facility] [address]   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  He has a past history of paroxysmal atrial fibrillation.  He also has a past history of hypothyroidism.  He has hypercholesterolemia.  He has had significant situational stress over the past several years.  Since last visit she's been doing well on his current therapy.  He has not been experiencing any recurrent atrial fib  Current Outpatient Prescriptions  Medication Sig Dispense Refill  . ALPRAZolam (XANAX) 0.25 MG tablet Take 1 tablet (0.25 mg total) by mouth 2 (two) times daily.  100 tablet  2  . amLODipine (NORVASC) 5 MG tablet Take 1 tablet (5 mg total) by mouth daily.  30 tablet  11  . aspirin 325 MG tablet Take 325 mg by mouth daily.        Marland Kitchen ezetimibe (ZETIA) 10 MG tablet Take 1 tablet (10 mg total) by mouth daily.  30 tablet  11  . levothyroxine (SYNTHROID) 200 MCG tablet Take 200 mcg by mouth daily.        Marland Kitchen lisinopril (PRINIVIL,ZESTRIL) 20 MG tablet Take 1 tablet (20 mg total) by mouth 2 (two) times daily.  60 tablet  3  . metoprolol (TOPROL-XL) 25 MG 24 hr tablet Take 1 tablet (25 mg total) by mouth daily.  30 tablet  11  . Multiple Vitamin (MULTIVITAMIN) tablet Take 1 tablet by mouth daily.        Marland Kitchen omega-3 acid ethyl esters (LOVAZA) 1 G capsule Take 2 capsules (2 g total) by mouth 2 (two) times daily.  120 capsule  11  . propafenone (RYTHMOL SR) 325 MG 12 hr capsule Take 325 mg by mouth 2 (two) times daily.        Marland Kitchen DISCONTD: metoprolol (TOPROL-XL) 50 MG 24 hr tablet Take 1 tablet (50 mg total) by mouth daily.  30 tablet  11  . ALPRAZolam (XANAX) 0.25 MG tablet Take 1 tablet (0.25 mg total) by mouth at bedtime as needed for sleep.  100 tablet  2    Allergies  Allergen Reactions  . Crestor (Rosuvastatin Calcium)    . Lipitor (Atorvastatin Calcium)     Patient Active Problem List  Diagnoses  . HTN (hypertension)  . PAF (paroxysmal atrial fibrillation)  . High risk medication use    History  Smoking status  . Never Smoker   Smokeless tobacco  . Not on file    History  Alcohol Use  . Yes    4X WEEK    Family History  Problem Relation Age of Onset  . Hypertension Mother   . Hyperlipidemia Mother   . Heart attack Maternal Grandfather     Review of Systems: The patient denies any heat or cold intolerance.  No weight gain or weight loss.  The patient denies headaches or blurry vision.  There is no cough or sputum production.  The patient denies dizziness.  There is no hematuria or hematochezia.  The patient denies any muscle aches or arthritis.  The patient denies any rash.  The patient denies frequent falling or instability.  There is no history of depression or anxiety.  All other systems were reviewed and are negative.   Physical Exam: Filed Vitals:   12/17/10 1037  BP:  120/84  Pulse: 80   the general appearance reveals a well-developed well-nourished gentleman in no distress.The head and neck exam reveals pupils equal and reactive.  Extraocular movements are full.  There is no scleral icterus.  The mouth and pharynx are normal.  The neck is supple.  The carotids reveal no bruits.  The jugular venous pressure is normal.  The  thyroid is not enlarged.  There is no lymphadenopathy.  The chest is clear to percussion and auscultation.  There are no rales or rhonchi.  Expansion of the chest is symmetrical.  The precordium is quiet.  The first heart sound is normal.  The second heart sound is physiologically split.  There is no murmur gallop rub or click.  There is no abnormal lift or heave.  The abdomen is soft and nontender.  The bowel sounds are normal.  The liver and spleen are not enlarged.  There are no abdominal masses.  There are no abdominal bruits.  Extremities reveal good pedal pulses.   There is no phlebitis or edema.  There is no cyanosis or clubbing.  Strength is normal and symmetrical in all extremities.  There is no lateralizing weakness.  There are no sensory deficits.  The skin is warm and dry.  There is no rash.  EKG shows normal sinus rhythm and is within normal limits    Assessment / Plan: Continue same medication except no dose Toprol XL to just 25 mg daily.  Recheck in 3 months for office visit and EKG.

## 2010-12-17 NOTE — Assessment & Plan Note (Signed)
The patient denies any palpitations.  He does complain of exertional fatigue and lack of stamina which he attributes to his Toprol.  He has been taking Toprol 50 mg daily.  He is in normal sinus rhythm today and we will cut back on his Toprol to 25 mg daily and see if his exertional stamina improves.  He remains on Rythmol twice a day for additional antiarrhythmic protection against recurrent atrial fib

## 2010-12-17 NOTE — Patient Instructions (Signed)
Will decrease your Toprol to 25 mg daily Your physician recommends that you schedule a follow-up appointment in: 3 months for office visit and EKG

## 2010-12-17 NOTE — Assessment & Plan Note (Signed)
Blood pressure is remaining normal on current therapy. 

## 2011-01-14 ENCOUNTER — Other Ambulatory Visit: Payer: Self-pay | Admitting: *Deleted

## 2011-01-14 MED ORDER — PROPAFENONE HCL ER 325 MG PO CP12: 325.0000 mg | ORAL_CAPSULE | Freq: Two times a day (BID) | ORAL | Status: DC

## 2011-01-18 HISTORY — PX: INGUINAL HERNIA REPAIR: SUR1180

## 2011-01-26 ENCOUNTER — Other Ambulatory Visit: Payer: Self-pay | Admitting: *Deleted

## 2011-01-26 MED ORDER — LISINOPRIL 20 MG PO TABS: 20.0000 mg | ORAL_TABLET | Freq: Two times a day (BID) | ORAL | Status: DC

## 2011-02-09 ENCOUNTER — Other Ambulatory Visit: Payer: Self-pay | Admitting: *Deleted

## 2011-02-09 MED ORDER — PROPAFENONE HCL ER 325 MG PO CP12: 325.0000 mg | ORAL_CAPSULE | Freq: Two times a day (BID) | ORAL | Status: DC

## 2011-02-09 NOTE — Telephone Encounter (Signed)
Refilled propafenone

## 2011-03-11 ENCOUNTER — Ambulatory Visit (INDEPENDENT_AMBULATORY_CARE_PROVIDER_SITE_OTHER): Payer: BC Managed Care – PPO | Admitting: Cardiology

## 2011-03-11 ENCOUNTER — Encounter: Payer: Self-pay | Admitting: Cardiology

## 2011-03-11 ENCOUNTER — Other Ambulatory Visit: Payer: Self-pay | Admitting: *Deleted

## 2011-03-11 VITALS — BP 128/88 | HR 80 | Ht 75.0 in | Wt 258.0 lb

## 2011-03-11 DIAGNOSIS — I10 Essential (primary) hypertension: Secondary | ICD-10-CM

## 2011-03-11 DIAGNOSIS — F419 Anxiety disorder, unspecified: Secondary | ICD-10-CM

## 2011-03-11 DIAGNOSIS — I119 Hypertensive heart disease without heart failure: Secondary | ICD-10-CM

## 2011-03-11 DIAGNOSIS — I4891 Unspecified atrial fibrillation: Secondary | ICD-10-CM

## 2011-03-11 DIAGNOSIS — E039 Hypothyroidism, unspecified: Secondary | ICD-10-CM

## 2011-03-11 DIAGNOSIS — I480 Paroxysmal atrial fibrillation: Secondary | ICD-10-CM

## 2011-03-11 MED ORDER — ALPRAZOLAM 0.25 MG PO TABS: 0.2500 mg | ORAL_TABLET | Freq: Two times a day (BID) | ORAL | Status: DC

## 2011-03-11 NOTE — Progress Notes (Signed)
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  He has a history of paroxysmal atrial fibrillation.  He has a past history of hypothyroidism and a history of hypercholesterolemia.  Since last visit he has continued to have significant situational stress in his life.  Fortunately he has been maintaining normal sinus rhythm.  He has been feeling well.  He is getting regular exercise.  Current Outpatient Prescriptions  Medication Sig Dispense Refill  . ALPRAZolam (XANAX) 0.25 MG tablet Take 1 tablet (0.25 mg total) by mouth 2 (two) times daily.  100 tablet  2  . amLODipine (NORVASC) 5 MG tablet Take 1 tablet (5 mg total) by mouth daily.  30 tablet  11  . aspirin 325 MG tablet Take 325 mg by mouth daily.        Marland Kitchen ezetimibe (ZETIA) 10 MG tablet Take 1 tablet (10 mg total) by mouth daily.  30 tablet  11  . levothyroxine (SYNTHROID) 200 MCG tablet Take 200 mcg by mouth daily.        Marland Kitchen lisinopril (PRINIVIL,ZESTRIL) 20 MG tablet Take 1 tablet (20 mg total) by mouth 2 (two) times daily.  60 tablet  6  . metoprolol (TOPROL-XL) 25 MG 24 hr tablet Take 1 tablet (25 mg total) by mouth daily.  30 tablet  11  . Multiple Vitamin (MULTIVITAMIN) tablet Take 1 tablet by mouth daily.        Marland Kitchen omega-3 acid ethyl esters (LOVAZA) 1 G capsule Take 2 capsules (2 g total) by mouth 2 (two) times daily.  120 capsule  11  . propafenone (RYTHMOL SR) 325 MG 12 hr capsule Take 1 capsule (325 mg total) by mouth 2 (two) times daily.  60 capsule  8  . ALPRAZolam (XANAX) 0.25 MG tablet Take 1 tablet (0.25 mg total) by mouth at bedtime as needed for sleep.  100 tablet  2    Allergies  Allergen Reactions  . Crestor (Rosuvastatin Calcium)   . Lipitor (Atorvastatin Calcium)     Patient Active Problem List  Diagnoses  . HTN  (hypertension)  . PAF (paroxysmal atrial fibrillation)  . High risk medication use  . Hypothyroidism    History  Smoking status  . Never Smoker   Smokeless tobacco  . Not on file    History  Alcohol Use  . Yes    4X WEEK    Family History  Problem Relation Age of Onset  . Hypertension Mother   . Hyperlipidemia Mother   . Heart attack Maternal Grandfather     Review of Systems: The patient denies any heat or cold intolerance.  No weight gain or weight loss.  The patient denies headaches or blurry vision.  There is no cough or sputum production.  The patient denies dizziness.  There is no hematuria or hematochezia.  The patient denies any muscle aches or arthritis.  The patient denies any rash.  The patient denies frequent falling or instability.  There is no history of depression or anxiety.  All other systems were reviewed and are negative.   Physical Exam: Filed Vitals:   03/11/11 0923  BP: 128/88  Pulse: 80   The general appearance reveals a well-developed well-nourished gentleman in no distress.The head and neck exam reveals pupils equal and reactive.  Extraocular movements are full.  There is no scleral icterus.  The mouth and pharynx are normal.  The neck is supple.  The carotids reveal no bruits.  The jugular venous pressure is normal.  The  thyroid is not enlarged.  There is no lymphadenopathy.  The chest is clear to percussion and auscultation.  There are no rales or rhonchi.  Expansion of the chest is symmetrical.  The precordium is quiet.  The first heart sound is normal.  The second heart sound is physiologically split.  There is no murmur gallop rub or click.  There is no abnormal lift or heave.  The abdomen is soft and nontender.  The bowel sounds are normal.  The liver and spleen are not enlarged.  There are no abdominal masses.  There are no abdominal bruits.  Extremities reveal good pedal pulses.  There is no phlebitis or edema.  There is no cyanosis or clubbing.   Strength is normal and symmetrical in all extremities.  There is no lateralizing weakness.  There are no sensory deficits.  The skin is warm and dry.  There is no rash.  EKG today shows normal sinus rhythm and nonspecific ST-T wave abnormalities.  The QTc interval is normal.   Assessment / Plan: Continue same medication.  Recheck in 4 months for followup office visit.

## 2011-03-11 NOTE — Assessment & Plan Note (Signed)
The patient is clinically euthyroid.  His thyroid function is checked by Dr. Evlyn Kanner.

## 2011-03-11 NOTE — Patient Instructions (Signed)
Your physician recommends that you continue on your current medications as directed. Please refer to the Current Medication list given to you today.  Your physician recommends that you schedule a follow-up appointment in: 4 months  

## 2011-03-11 NOTE — Telephone Encounter (Signed)
Refill on alprazolam 

## 2011-03-11 NOTE — Assessment & Plan Note (Signed)
The patient was able to successfully cut back on his Toprol to just 25 mg once a day and made a significant improvement in his sense of well-being and a decrease in his lethargy and malaise.  He remains on Rythmol SR 325 mg every 12 hours.  He has had no recurrent atrial fibrillation that he has been aware of.  He remains on aspirin for anticoagulation.

## 2011-03-11 NOTE — Assessment & Plan Note (Signed)
Patient has not been having any headaches or dizzy spells.  Blood pressure has been remaining stable on current therapy.

## 2011-03-21 ENCOUNTER — Encounter (INDEPENDENT_AMBULATORY_CARE_PROVIDER_SITE_OTHER): Payer: BC Managed Care – PPO | Admitting: *Deleted

## 2011-03-21 ENCOUNTER — Telehealth: Payer: Self-pay | Admitting: *Deleted

## 2011-03-21 DIAGNOSIS — R52 Pain, unspecified: Secondary | ICD-10-CM

## 2011-03-21 NOTE — Telephone Encounter (Signed)
Patient phoned stating that he just got back from a taking a plane trip that was 3 hours one way and that he felt he had another blood clot.  Did schedule patient for a doppler.  Received phone call that preliminary report showed it was patent and compressible.  Will await final report.  Discussed with  Dr. Patty Sermons

## 2011-03-21 NOTE — Progress Notes (Signed)
Addended by: Judithe Modest D on: 03/21/2011 11:25 AM   Modules accepted: Orders

## 2011-03-22 NOTE — Telephone Encounter (Signed)
Advised patinet 

## 2011-03-24 NOTE — Procedures (Unsigned)
DUPLEX DEEP VENOUS EXAM - LOWER EXTREMITY  INDICATION:  Pain.  HISTORY:  Edema:  No. Trauma/Surgery:  No. Pain:  Complaint of right medial knee and thigh pain. PE:  No. Previous DVT:  History of bilateral lower extremity deep venous thromboses in 2008. Anticoagulants: Other:  DUPLEX EXAM:               CFV   SFV   PopV  PTV    GSV               R  L  R  L  R  L  R   L  R  L Thrombosis    o  o  o     o     o      o Spontaneous   +  +  +     +     +      + Phasic        +  +  +     +     +      + Augmentation  +  +  +     +     +      + Compressible  +  +  +     +     +      + Competent  Legend:  + - yes  o - no  p - partial  D - decreased  IMPRESSION:  No evidence of deep or superficial venous thrombosis noted in the right lower extremity.  Preliminary findings stating the patency and compressibility of the right lower extremity venous system was called and given to [facility] at Dr. Yevonne Pax office on 03/21/2011 at 4:30 p.m.       _____________________________ [name]. Edilia Bo, M.D.  CH/MEDQ  D:  03/22/2011  T:  03/22/2011  Job:  [id number]

## 2011-03-28 NOTE — Telephone Encounter (Signed)
Reviewed study and impression found no evidence of deep or superficial venous thrombosis found. Has been reported to patient

## 2011-03-29 NOTE — Telephone Encounter (Signed)
Agree 

## 2011-05-09 ENCOUNTER — Other Ambulatory Visit: Payer: Self-pay | Admitting: Cardiology

## 2011-05-09 MED ORDER — OMEGA-3-ACID ETHYL ESTERS 1 G PO CAPS: 2.0000 g | ORAL_CAPSULE | Freq: Two times a day (BID) | ORAL | Status: DC

## 2011-06-21 ENCOUNTER — Other Ambulatory Visit: Payer: Self-pay | Admitting: *Deleted

## 2011-06-21 DIAGNOSIS — F419 Anxiety disorder, unspecified: Secondary | ICD-10-CM

## 2011-06-21 MED ORDER — ALPRAZOLAM 0.25 MG PO TABS: 0.2500 mg | ORAL_TABLET | Freq: Two times a day (BID) | ORAL | Status: DC

## 2011-06-21 NOTE — Telephone Encounter (Signed)
CALLED XANAX IN / REFILL COMPLETED

## 2011-07-01 ENCOUNTER — Encounter: Payer: Self-pay | Admitting: Cardiology

## 2011-07-01 ENCOUNTER — Ambulatory Visit (INDEPENDENT_AMBULATORY_CARE_PROVIDER_SITE_OTHER): Payer: BC Managed Care – PPO | Admitting: Cardiology

## 2011-07-01 VITALS — BP 118/88 | HR 80 | Ht 75.0 in | Wt 242.0 lb

## 2011-07-01 DIAGNOSIS — I4891 Unspecified atrial fibrillation: Secondary | ICD-10-CM

## 2011-07-01 DIAGNOSIS — I480 Paroxysmal atrial fibrillation: Secondary | ICD-10-CM

## 2011-07-01 DIAGNOSIS — I119 Hypertensive heart disease without heart failure: Secondary | ICD-10-CM

## 2011-07-01 DIAGNOSIS — I10 Essential (primary) hypertension: Secondary | ICD-10-CM

## 2011-07-01 DIAGNOSIS — E039 Hypothyroidism, unspecified: Secondary | ICD-10-CM

## 2011-07-01 NOTE — Progress Notes (Signed)
Isaiah [name] Date of Birth:  12/01/1960 Isaiah [name] 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  He has a past history of paroxysmal atrial fibrillation.  He also has a history of hypothyroidism and a history of hypercholesterolemia and history of essential hypertension.  Since last visit she's been doing well with no new cardiac symptoms.  Current Outpatient Prescriptions  Medication Sig Dispense Refill  . ALPRAZolam (XANAX) 0.25 MG tablet Take 1 tablet (0.25 mg total) by mouth 2 (two) times daily.  100 tablet  2  . amLODipine (NORVASC) 5 MG tablet Take 1 tablet (5 mg total) by mouth daily.  30 tablet  11  . aspirin 325 MG tablet Take 325 mg by mouth daily.        Marland Kitchen ezetimibe (ZETIA) 10 MG tablet Take 1 tablet (10 mg total) by mouth daily.  30 tablet  11  . levothyroxine (SYNTHROID) 200 MCG tablet Take 200 mcg by mouth daily.        Marland Kitchen lisinopril (PRINIVIL,ZESTRIL) 20 MG tablet Take 1 tablet (20 mg total) by mouth 2 (two) times daily.  60 tablet  6  . metoprolol (TOPROL-XL) 25 MG 24 hr tablet Take 1 tablet (25 mg total) by mouth daily.  30 tablet  11  . Multiple Vitamin (MULTIVITAMIN) tablet Take 1 tablet by mouth daily.        Marland Kitchen omega-3 acid ethyl esters (LOVAZA) 1 G capsule Take 2 capsules (2 g total) by mouth 2 (two) times daily.  120 capsule  11  . propafenone (RYTHMOL SR) 325 MG 12 hr capsule Take 1 capsule (325 mg total) by mouth 2 (two) times daily.  60 capsule  8  . ALPRAZolam (XANAX) 0.25 MG tablet Take 1 tablet (0.25 mg total) by mouth at bedtime as needed for sleep.  100 tablet  2    Allergies  Allergen Reactions  . Crestor (Rosuvastatin Calcium)   . Lipitor (Atorvastatin Calcium)     Patient Active Problem List  Diagnosis  . HTN (hypertension)  . PAF (paroxysmal atrial fibrillation)  . High risk medication use  .  Hypothyroidism    History  Smoking status  . Never Smoker   Smokeless tobacco  . Not on file    History  Alcohol Use  . Yes    4X WEEK    Family History  Problem Relation Age of Onset  . Hypertension Mother   . Hyperlipidemia Mother   . Heart attack Maternal Grandfather     Review of Systems: The patient denies any heat or cold intolerance.  No weight gain or weight loss.  The patient denies headaches or blurry vision.  There is no cough or sputum production.  The patient denies dizziness.  There is no hematuria or hematochezia.  The patient denies any muscle aches or arthritis.  The patient denies any rash.  The patient denies frequent falling or instability.  There is no history of depression or anxiety.  All other systems were reviewed and are negative.   Physical Exam: Filed Vitals:   07/01/11 0834  BP: 118/88  Pulse: 80   general appearance reveals a well-developed well-nourished gentleman in no distress.The head and neck exam reveals pupils equal and reactive.  Extraocular movements are full.  There is no scleral icterus.  The mouth and pharynx are normal.  The neck is supple.  The carotids reveal no bruits.  The jugular venous pressure is normal.  The  thyroid is not enlarged.  There is no lymphadenopathy.  The chest is clear to percussion and auscultation.  There are no rales or rhonchi.  Expansion of the chest is symmetrical.  The precordium is quiet.  The first heart sound is normal.  The second heart sound is physiologically split.  There is no murmur gallop rub or click.  There is no abnormal lift or heave.  The abdomen is soft and nontender.  The bowel sounds are normal.  The liver and spleen are not enlarged.  There are no abdominal masses.  There are no abdominal bruits.  Extremities reveal good pedal pulses.  There is no phlebitis or edema.  There is no cyanosis or clubbing.  Strength is normal and symmetrical in all extremities.  There is no lateralizing weakness.   There are no sensory deficits.  The skin is warm and dry.  There is no rash.      Assessment / Plan: Continue same medication.  Recheck in 4 months for followup office visit and EKG

## 2011-07-01 NOTE — Assessment & Plan Note (Signed)
The patient has maintained normal sinus rhythm.  No atrial fibrillation.  He remains on Rythmol and beta blocker.

## 2011-07-01 NOTE — Patient Instructions (Addendum)
Your physician recommends that you continue on your current medications as directed. Please refer to the Current Medication list given to you today. Your physician wants you to follow-up in: 4 months You will receive a reminder letter in the mail two months in advance. If you don't receive a letter, please call our office to schedule the follow-up appointment.  

## 2011-07-01 NOTE — Assessment & Plan Note (Signed)
The patient is clinically euthyroid.  Dr. Evlyn Kanner checks his thyroid functions and his lipids

## 2011-07-01 NOTE — Assessment & Plan Note (Signed)
His blood pressure has generally been maintaining in normal range.  Today he had to participate in a roadside rescue as he was driving to our office and his blood pressure is a little elevated as a result of that excitement.  No headaches or dizzy spells.

## 2011-07-26 ENCOUNTER — Ambulatory Visit (INDEPENDENT_AMBULATORY_CARE_PROVIDER_SITE_OTHER): Payer: BC Managed Care – PPO | Admitting: Nurse Practitioner

## 2011-07-26 ENCOUNTER — Encounter: Payer: Self-pay | Admitting: Nurse Practitioner

## 2011-07-26 VITALS — BP 150/88 | HR 74 | Ht 75.5 in | Wt 250.8 lb

## 2011-07-26 DIAGNOSIS — I480 Paroxysmal atrial fibrillation: Secondary | ICD-10-CM

## 2011-07-26 DIAGNOSIS — I4891 Unspecified atrial fibrillation: Secondary | ICD-10-CM

## 2011-07-26 DIAGNOSIS — I10 Essential (primary) hypertension: Secondary | ICD-10-CM

## 2011-07-26 LAB — BASIC METABOLIC PANEL
BUN: 21 mg/dL (ref 6–23)
CO2: 31 mEq/L (ref 19–32)
Calcium: 10 mg/dL (ref 8.4–10.5)
Chloride: 99 mEq/L (ref 96–112)
Creatinine, Ser: 1.5 mg/dL (ref 0.4–1.5)
GFR: 52.79 mL/min — ABNORMAL LOW (ref >60.00)
Glucose, Bld: 78 mg/dL (ref 70–99)
Potassium: 4.6 mEq/L (ref 3.5–5.1)
Sodium: 138 mEq/L (ref 135–145)

## 2011-07-26 MED ORDER — HYDROCHLOROTHIAZIDE 25 MG PO TABS: 25.0000 mg | ORAL_TABLET | Freq: Every day | ORAL | Status: DC

## 2011-07-26 NOTE — Patient Instructions (Addendum)
We are adding HCTZ 25 mg daily for your BP  Stay on your other medicines  I will see you in 3 to 4 weeks (August 9th)  Cut back on the salt  Monitor your blood pressure at home  Regular exercise is encouraged.  Call the [facility] office at [phone] if you have any questions, problems or concerns.

## 2011-07-26 NOTE — Progress Notes (Signed)
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 Patty Sermons. He has HTN and PAF. Is maintained in sinus on Rythmol. His last stress test was in 2011 and last echo was in 2012. Does have grade 1 diastolic dysfunction.   He comes in today. He has been on vacation. He admits that he "vacationed too much". Had more in the way of alcohol and more salt. He was with his family and was trying to swim laps. He "did now want to be outdone" so he swam 5 laps and when he finished, he was quite short of breath. Thought he was out of rhythm. Felt like his heart was beating hard. He feels bloated. Weight is up. BP is up. He got very concerned and wanted to be seen. He worked out earlier today and did ok. He focuses more on weight lifting instead of aerobic activity.   Current Outpatient Prescriptions on File Prior to Visit  Medication Sig Dispense Refill  . ALPRAZolam (XANAX) 0.25 MG tablet Take 1 tablet (0.25 mg total) by mouth 2 (two) times daily.  100 tablet  2  . amLODipine (NORVASC) 5 MG tablet Take 1 tablet (5 mg total) by mouth daily.  30 tablet  11  . aspirin 325 MG tablet Take 325 mg by mouth daily.        Marland Kitchen ezetimibe (ZETIA) 10 MG tablet Take 1 tablet (10 mg total) by mouth daily.  30 tablet  11  . levothyroxine (SYNTHROID) 200 MCG tablet Take 200 mcg by mouth daily.        Marland Kitchen lisinopril (PRINIVIL,ZESTRIL) 20 MG tablet Take 1 tablet (20 mg total) by mouth 2 (two) times daily.  60 tablet  6  . metoprolol (TOPROL-XL) 25 MG 24 hr tablet Take 1 tablet (25 mg total) by mouth daily.  30 tablet  11  . Multiple Vitamin (MULTIVITAMIN) tablet Take 1 tablet by mouth daily.        Marland Kitchen omega-3 acid ethyl esters (LOVAZA) 1 G capsule Take 2 capsules (2 g total) by mouth 2 (two) times daily.  120 capsule  11  . propafenone (RYTHMOL SR) 325 MG 12 hr capsule Take 1 capsule (325 mg total) by mouth 2 (two)  times daily.  60 capsule  8  . ALPRAZolam (XANAX) 0.25 MG tablet Take 1 tablet (0.25 mg total) by mouth at bedtime as needed for sleep.  100 tablet  2  . hydrochlorothiazide (HYDRODIURIL) 25 MG tablet Take 1 tablet (25 mg total) by mouth daily.  90 tablet  3    Allergies  Allergen Reactions  . Crestor (Rosuvastatin Calcium)   . Lipitor (Atorvastatin Calcium)     Past Medical History  Diagnosis Date  . PAF (paroxysmal atrial fibrillation)     no recurrence since Feb 2012  . Hypertension   . Thyroid disease   . Hyperlipidemia   . Situational stress   . Diastolic dysfunction     Grade I per echo in 2/12    Past Surgical History  Procedure Date  . Inguinal hernia repair 1992  . Cardiovascular stress test 07/17/2009    EF 59%  . Transthoracic echocardiogram 03/05/2010    EF 60-65%    History  Smoking status  . Never Smoker   Smokeless tobacco  . Not on file    History  Alcohol Use  . Yes  4X WEEK    Family History  Problem Relation Age of Onset  . Hypertension Mother   . Hyperlipidemia Mother   . Heart attack Maternal Grandfather     Review of Systems: The review of systems is per the HPI.  All other systems were reviewed and are negative.  Physical Exam: BP 150/88  Pulse 74  Ht 6' 3.5" (1.918 m)  Wt 250 lb 12.8 oz (113.762 kg)  BMI 30.93 kg/m2 Patient is very pleasant and in no acute distress. Skin is warm and dry. Color is normal.  HEENT is unremarkable. Normocephalic/atraumatic. PERRL. Sclera are nonicteric. Neck is supple. No masses. No JVD. Lungs are clear. Cardiac exam shows a regular rate and rhythm. Abdomen is soft. Extremities are without edema. Gait and ROM are intact. No gross neurologic deficits noted.  LABORATORY DATA: BMET is pending  His EKG today shows sinus rhythm and is normal.    Assessment / Plan:

## 2011-07-26 NOTE — Assessment & Plan Note (Signed)
He is in sinus today. I have left him on his current dose of Rythmol. He may have had some PAF but is in sinus at this time. I think he will feel better as he gets back on track with his diet and exercise. He will let us know if he is not feeling better by the end of the week. If not, will update his stress test and echo but I suspect he will do better as he gets back on track. Patient is agreeable to this plan and will call if any problems develop in the interim.

## 2011-07-26 NOTE — Assessment & Plan Note (Signed)
Blood pressure is up. Weight is up. He has had more salt and alcohol. I have added HCTZ 25 mg. Checking BMET today. Will see him back in a couple of weeks.

## 2011-07-27 ENCOUNTER — Other Ambulatory Visit: Payer: Self-pay | Admitting: *Deleted

## 2011-07-27 DIAGNOSIS — E7800 Pure hypercholesterolemia, unspecified: Secondary | ICD-10-CM

## 2011-07-27 MED ORDER — EZETIMIBE 10 MG PO TABS: 10.0000 mg | ORAL_TABLET | Freq: Every day | ORAL | Status: DC

## 2011-07-27 NOTE — Telephone Encounter (Signed)
Refilled zetia.

## 2011-07-29 NOTE — Addendum Note (Signed)
Addended by: Lacie Scotts on: 07/29/2011 04:09 PM   Modules accepted: Orders

## 2011-08-05 ENCOUNTER — Telehealth: Payer: Self-pay | Admitting: Cardiology

## 2011-08-05 NOTE — Telephone Encounter (Signed)
plz return call to patient at [phone] regarding newly prescribed medication

## 2011-08-05 NOTE — Telephone Encounter (Signed)
Blood pressure is running 108/107 systolic and he states he is feeling tired and a little dizzy. Patient states just started HCTZ 25 mg 07/26/11. Will forward to Dawayne Patricia NP for review

## 2011-08-05 NOTE — Telephone Encounter (Signed)
Discussed with Dawayne Patricia. NP and will have him decrease his HCTZ to 1/2 tablet and call back if no better

## 2011-08-26 ENCOUNTER — Ambulatory Visit (INDEPENDENT_AMBULATORY_CARE_PROVIDER_SITE_OTHER): Payer: BC Managed Care – PPO | Admitting: Nurse Practitioner

## 2011-08-26 ENCOUNTER — Encounter: Payer: Self-pay | Admitting: Nurse Practitioner

## 2011-08-26 ENCOUNTER — Telehealth: Payer: Self-pay | Admitting: *Deleted

## 2011-08-26 VITALS — BP 102/74 | HR 83 | Ht 75.5 in | Wt 243.0 lb

## 2011-08-26 DIAGNOSIS — I480 Paroxysmal atrial fibrillation: Secondary | ICD-10-CM

## 2011-08-26 DIAGNOSIS — I10 Essential (primary) hypertension: Secondary | ICD-10-CM

## 2011-08-26 DIAGNOSIS — I4891 Unspecified atrial fibrillation: Secondary | ICD-10-CM

## 2011-08-26 LAB — BASIC METABOLIC PANEL
BUN: 29 mg/dL — ABNORMAL HIGH (ref 6–23)
CO2: 28 mEq/L (ref 19–32)
Calcium: 9.7 mg/dL (ref 8.4–10.5)
Chloride: 98 mEq/L (ref 96–112)
Creatinine, Ser: 1.6 mg/dL — ABNORMAL HIGH (ref 0.4–1.5)
GFR: 49.34 mL/min — ABNORMAL LOW (ref >60.00)
Glucose, Bld: 89 mg/dL (ref 70–99)
Potassium: 4.1 mEq/L (ref 3.5–5.1)
Sodium: 134 mEq/L — ABNORMAL LOW (ref 135–145)

## 2011-08-26 NOTE — Assessment & Plan Note (Signed)
Blood pressure is much better. He has done a very nice job with his diet and exercise. I have stopped the HCTZ. He is to continue to monitor his readings at home. If it starts trending up, then he will restart. We will check BMET today. i have also asked him to bring his cuff in to check for correlation when he comes back. He is using a wrist cuff but seems to be matching with what we got today. We will see him back in October as planned. Patient is agreeable to this plan and will call if any problems develop in the interim. '

## 2011-08-26 NOTE — Telephone Encounter (Signed)
Called patient regarding labs. Pt aware of findings and plan. Will repeat at next visit. Vista Mink, CMA

## 2011-08-26 NOTE — Assessment & Plan Note (Signed)
No recurrent atrial fib. Does have some skips. Will check a BMET today. He may use an extra Metoprolol prn. He remains on his Rythmol.

## 2011-08-26 NOTE — Patient Instructions (Signed)
You may stop the HCTZ.  Monitor your blood pressure at home. If it starts going up, you may restart the HCTZ at half dose  Bring your BP cuff with you at your next visit  We will recheck your potassium rechecked today  See Dr. Patty Sermons in October  Call the [facility] office at [phone] if you have any questions, problems or concerns.

## 2011-08-26 NOTE — Progress Notes (Signed)
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 He is seen for Dr. Patty Sermons. He has HTN and PAF. He is maintained on low dose BB and Rythmol. His last stress test was in 2011 and last echo was in 2012. He does have grade 1 diastolic dysfunction. I saw him last month with concerns for being back out of rhythm. His blood pressure was elevated. He had gotten way off track with his diet and exercise program. Had had more salt and more alcohol. We added low dose HCTZ.   He comes in today. He is here alone. He is doing very well. Does note a few "skips". But he is exercising and doing better with his diet. His weight is down 7 pounds. BP has dropped considerably. We have already cut the HCTZ in half and some days, he actually quartered it. He is not dizzy or lightheaded. No chest pain.   Current Outpatient Prescriptions on File Prior to Visit  Medication Sig Dispense Refill  . ALPRAZolam (XANAX) 0.25 MG tablet Take 1 tablet (0.25 mg total) by mouth 2 (two) times daily.  100 tablet  2  . amLODipine (NORVASC) 5 MG tablet Take 1 tablet (5 mg total) by mouth daily.  30 tablet  11  . aspirin 325 MG tablet Take 325 mg by mouth daily.        Marland Kitchen ezetimibe (ZETIA) 10 MG tablet Take 1 tablet (10 mg total) by mouth daily.  30 tablet  11  . hydrochlorothiazide (HYDRODIURIL) 25 MG tablet Take 25 mg by mouth as directed. 1/2 tablet daily      . levothyroxine (SYNTHROID) 200 MCG tablet Take 200 mcg by mouth daily.        Marland Kitchen lisinopril (PRINIVIL,ZESTRIL) 20 MG tablet Take 1 tablet (20 mg total) by mouth 2 (two) times daily.  60 tablet  6  . metoprolol (TOPROL-XL) 25 MG 24 hr tablet Take 1 tablet (25 mg total) by mouth daily.  30 tablet  11  . Multiple Vitamin (MULTIVITAMIN) tablet Take 1 tablet by mouth daily.        Marland Kitchen omega-3 acid ethyl esters (LOVAZA) 1 G capsule Take 2 capsules (2 g total) by mouth 2 (two) times  daily.  120 capsule  11  . propafenone (RYTHMOL SR) 325 MG 12 hr capsule Take 1 capsule (325 mg total) by mouth 2 (two) times daily.  60 capsule  8  . ALPRAZolam (XANAX) 0.25 MG tablet Take 1 tablet (0.25 mg total) by mouth at bedtime as needed for sleep.  100 tablet  2    Allergies  Allergen Reactions  . Crestor (Rosuvastatin Calcium)   . Lipitor (Atorvastatin Calcium)     Past Medical History  Diagnosis Date  . PAF (paroxysmal atrial fibrillation)     no recurrence since Feb 2012  . Hypertension   . Thyroid disease   . Hyperlipidemia   . Situational stress   . Diastolic dysfunction     Grade I per echo in 2/12    Past Surgical History  Procedure Date  . Inguinal hernia repair 1992  . Cardiovascular stress test 07/17/2009    EF 59%  . Transthoracic echocardiogram 03/05/2010    EF 60-65%    History  Smoking status  . Never Smoker   Smokeless tobacco  . Not on file    History  Alcohol Use  . Yes  4X WEEK    Family History  Problem Relation Age of Onset  . Hypertension Mother   . Hyperlipidemia Mother   . Heart attack Maternal Grandfather     Review of Systems: The review of systems is per the HPI.  All other systems were reviewed and are negative.  Physical Exam: BP 102/74  Pulse 83  Ht 6' 3.5" (1.918 m)  Wt 243 lb (110.224 kg)  BMI 29.97 kg/m2 Patient is very pleasant and in no acute distress.Weight is down 7 pounds.  Skin is warm and dry. Color is normal.  HEENT is unremarkable. Normocephalic/atraumatic. PERRL. Sclera are nonicteric. Neck is supple. No masses. No JVD. Lungs are clear. Cardiac exam shows a regular rate and rhythm. Abdomen is soft. Extremities are without edema. Gait and ROM are intact. No gross neurologic deficits noted.   LABORATORY DATA: BMET is pending for today.    Assessment / Plan:

## 2011-09-13 ENCOUNTER — Other Ambulatory Visit: Payer: Self-pay | Admitting: *Deleted

## 2011-09-13 MED ORDER — AMLODIPINE BESYLATE 5 MG PO TABS: 5.0000 mg | ORAL_TABLET | Freq: Every day | ORAL | Status: DC

## 2011-11-10 ENCOUNTER — Other Ambulatory Visit: Payer: Self-pay | Admitting: *Deleted

## 2011-11-10 DIAGNOSIS — F419 Anxiety disorder, unspecified: Secondary | ICD-10-CM

## 2011-11-10 MED ORDER — ALPRAZOLAM 0.25 MG PO TABS: 0.2500 mg | ORAL_TABLET | Freq: Two times a day (BID) | ORAL | Status: DC

## 2011-11-18 ENCOUNTER — Ambulatory Visit (INDEPENDENT_AMBULATORY_CARE_PROVIDER_SITE_OTHER): Payer: BC Managed Care – PPO | Admitting: Cardiology

## 2011-11-18 ENCOUNTER — Encounter: Payer: Self-pay | Admitting: Cardiology

## 2011-11-18 VITALS — BP 140/70 | HR 78 | Resp 18 | Ht 75.0 in | Wt 249.0 lb

## 2011-11-18 DIAGNOSIS — I480 Paroxysmal atrial fibrillation: Secondary | ICD-10-CM

## 2011-11-18 DIAGNOSIS — I10 Essential (primary) hypertension: Secondary | ICD-10-CM

## 2011-11-18 DIAGNOSIS — I4891 Unspecified atrial fibrillation: Secondary | ICD-10-CM

## 2011-11-18 DIAGNOSIS — I119 Hypertensive heart disease without heart failure: Secondary | ICD-10-CM

## 2011-11-18 NOTE — Assessment & Plan Note (Signed)
Blood pressure has been remaining stable since last visit.  He is not having any headaches or dizzy spells.  He has been watching his salt intake.  He has had some recent back trouble so he has not exercised much in the past 6 weeks.  His weight is up 6 pounds since last visit and he will try to lose the weight

## 2011-11-18 NOTE — Assessment & Plan Note (Signed)
The patient has not been experiencing any recurrent paroxysmal atrial fibrillation

## 2011-11-18 NOTE — Progress Notes (Signed)
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 He has a past history of paroxysmal atrial fibrillation. He also has a history of hypothyroidism and a history of hypercholesterolemia and history of essential hypertension. Since last visit he has been doing well with no new cardiac symptoms.  Patient has a history of hypothyroidism and sees Dr. Evlyn Kanner.   Current Outpatient Prescriptions  Medication Sig Dispense Refill  . ALPRAZolam (XANAX) 0.25 MG tablet Take 1 tablet (0.25 mg total) by mouth 2 (two) times daily.  100 tablet  2  . amLODipine (NORVASC) 5 MG tablet Take 1 tablet (5 mg total) by mouth daily.  30 tablet  11  . aspirin 325 MG tablet Take 325 mg by mouth daily.        Marland Kitchen ezetimibe (ZETIA) 10 MG tablet Take 1 tablet (10 mg total) by mouth daily.  30 tablet  11  . levothyroxine (SYNTHROID) 200 MCG tablet Take 200 mcg by mouth daily.        Marland Kitchen lisinopril (PRINIVIL,ZESTRIL) 20 MG tablet Take 1 tablet (20 mg total) by mouth 2 (two) times daily.  60 tablet  6  . metoprolol (TOPROL-XL) 25 MG 24 hr tablet Take 1 tablet (25 mg total) by mouth daily.  30 tablet  11  . Multiple Vitamin (MULTIVITAMIN) tablet Take 1 tablet by mouth daily.        Marland Kitchen omega-3 acid ethyl esters (LOVAZA) 1 G capsule Take 2 capsules (2 g total) by mouth 2 (two) times daily.  120 capsule  11  . propafenone (RYTHMOL SR) 325 MG 12 hr capsule Take 1 capsule (325 mg total) by mouth 2 (two) times daily.  60 capsule  8    Allergies  Allergen Reactions  . Crestor (Rosuvastatin Calcium)   . Lipitor (Atorvastatin Calcium)     Patient Active Problem List  Diagnosis  . HTN (hypertension)  . PAF (paroxysmal atrial fibrillation)  . High risk medication use  . Hypothyroidism    History  Smoking status  . Never Smoker   Smokeless  tobacco  . Not on file    History  Alcohol Use  . Yes    4X WEEK    Family History  Problem Relation Age of Onset  . Hypertension Mother   . Hyperlipidemia Mother   . Heart attack Maternal Grandfather     Review of Systems: The patient denies any heat or cold intolerance.  No weight gain or weight loss.  The patient denies headaches or blurry vision.  There is no cough or sputum production.  The patient denies dizziness.  There is no hematuria or hematochezia.  The patient denies any muscle aches or arthritis.  The patient denies any rash.  The patient denies frequent falling or instability.  There is no history of depression or anxiety.  All other systems were reviewed and are negative.   Physical Exam: Filed Vitals:   11/18/11 1020  BP: 140/70  Pulse: 78  Resp: 18   the general appearance reveals a well-developed well-nourished gentleman in no distress.The head and neck exam reveals pupils equal and reactive.  Extraocular movements are full.  There is no scleral icterus.  The mouth and pharynx are normal.  The neck is supple.  The carotids reveal no bruits.  The jugular venous pressure is normal.  The  thyroid is not enlarged.  There is no lymphadenopathy.  The chest is clear to percussion and auscultation.  There are no rales or rhonchi.  Expansion of the chest is symmetrical.  The precordium is quiet.  The first heart sound is normal.  The second heart sound is physiologically split.  There is no murmur gallop rub or click.  There is no abnormal lift or heave.  The abdomen is soft and nontender.  The bowel sounds are normal.  The liver and spleen are not enlarged.  There are no abdominal masses.  There are no abdominal bruits.  Extremities reveal good pedal pulses.  There is no phlebitis or edema.  There is no cyanosis or clubbing.  Strength is normal and symmetrical in all extremities.  There is no lateralizing weakness.  There are no sensory deficits.  The skin is warm and dry.   There is no rash.      Assessment / Plan: Continue on same medication.  Recheck in 4 months for followup office visit EKG and fasting lipid panel hepatic function panel and basal metabolic panel

## 2011-11-18 NOTE — Patient Instructions (Addendum)
Your physician recommends that you continue on your current medications as directed. Please refer to the Current Medication list given to you today.  Your physician recommends that you schedule a follow-up appointment in: 4 months with fasting labs (lp/bmet/hfp)  

## 2011-11-30 ENCOUNTER — Other Ambulatory Visit: Payer: Self-pay | Admitting: *Deleted

## 2011-11-30 DIAGNOSIS — I4891 Unspecified atrial fibrillation: Secondary | ICD-10-CM

## 2011-11-30 MED ORDER — METOPROLOL SUCCINATE ER 25 MG PO TB24: 25.0000 mg | ORAL_TABLET | Freq: Every day | ORAL | Status: DC

## 2011-12-06 ENCOUNTER — Other Ambulatory Visit: Payer: Self-pay | Admitting: *Deleted

## 2011-12-06 MED ORDER — PROPAFENONE HCL ER 325 MG PO CP12: 325.0000 mg | ORAL_CAPSULE | Freq: Two times a day (BID) | ORAL | Status: DC

## 2011-12-12 ENCOUNTER — Other Ambulatory Visit: Payer: Self-pay | Admitting: *Deleted

## 2011-12-12 MED ORDER — AMLODIPINE BESYLATE 5 MG PO TABS: 5.0000 mg | ORAL_TABLET | Freq: Every day | ORAL | Status: DC

## 2011-12-23 ENCOUNTER — Other Ambulatory Visit: Payer: Self-pay

## 2011-12-23 MED ORDER — LISINOPRIL 20 MG PO TABS: 20.0000 mg | ORAL_TABLET | Freq: Two times a day (BID) | ORAL | Status: DC

## 2012-01-27 ENCOUNTER — Ambulatory Visit (INDEPENDENT_AMBULATORY_CARE_PROVIDER_SITE_OTHER): Payer: BC Managed Care – PPO | Admitting: Cardiology

## 2012-01-27 ENCOUNTER — Encounter: Payer: Self-pay | Admitting: Cardiology

## 2012-01-27 VITALS — BP 132/80 | HR 63 | Ht 75.5 in | Wt 248.1 lb

## 2012-01-27 DIAGNOSIS — I480 Paroxysmal atrial fibrillation: Secondary | ICD-10-CM

## 2012-01-27 DIAGNOSIS — I4891 Unspecified atrial fibrillation: Secondary | ICD-10-CM

## 2012-01-27 DIAGNOSIS — R079 Chest pain, unspecified: Secondary | ICD-10-CM

## 2012-01-27 DIAGNOSIS — I10 Essential (primary) hypertension: Secondary | ICD-10-CM

## 2012-01-27 NOTE — Patient Instructions (Addendum)
Your physician has requested that you have en exercise stress myoview. For further information please visit [email or web address] Please follow instruction sheet, as given.  Your physician recommends that you continue on your current medications as directed. Please refer to the Current Medication list given to you today.  Keep your February appointment

## 2012-01-27 NOTE — Assessment & Plan Note (Signed)
The patient comes in for work in visit today because of concern about chest tightness.  The episodes are not related to any particular activity.  They do not radiate.  They are in the precordial area and last anywhere from 5-10 minutes to several hours and then resolve on their own without specific therapy.  Patient does not have any history of ischemic heart disease.  His last cardiovascular stress test on 07/17/09 showed no ischemia and his ejection fraction was 59%.  He does have a family history of ischemic heart disease with maternal grandfather dying at age 11 of heart attack and paternal grandfather dying at 17 of a heart attack.  Patient has a history of hypercholesterolemia and essential hypertension and has been under a lot of stress related to divorce and child custody problems

## 2012-01-27 NOTE — Assessment & Plan Note (Signed)
Blood pressure has been remaining stable on current therapy.  No headaches or dizzy spells

## 2012-01-27 NOTE — Assessment & Plan Note (Signed)
The patient has been on propafenone.  He has not had any recurrence of his atrial fibrillation.

## 2012-01-27 NOTE — Progress Notes (Signed)
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  He comes in because of the recent development of intermittent chest tightness which he is very concerned about. He has a past history of paroxysmal atrial fibrillation. He also has a history of hypothyroidism and a history of hypercholesterolemia and history of essential hypertension.  Patient has a history of hypothyroidism and sees Dr. Evlyn Kanner.   Current Outpatient Prescriptions  Medication Sig Dispense Refill  . ALPRAZolam (XANAX) 0.25 MG tablet Take 1 tablet (0.25 mg total) by mouth 2 (two) times daily.  100 tablet  2  . amLODipine (NORVASC) 5 MG tablet Take 1 tablet (5 mg total) by mouth daily.  30 tablet  11  . aspirin 325 MG tablet Take 325 mg by mouth daily.        Marland Kitchen ezetimibe (ZETIA) 10 MG tablet Take 1 tablet (10 mg total) by mouth daily.  30 tablet  11  . levothyroxine (SYNTHROID) 200 MCG tablet Take 200 mcg by mouth daily.        Marland Kitchen lisinopril (PRINIVIL,ZESTRIL) 20 MG tablet Take 1 tablet (20 mg total) by mouth 2 (two) times daily.  60 tablet  3  . metoprolol succinate (TOPROL-XL) 25 MG 24 hr tablet Take 1 tablet (25 mg total) by mouth daily.  30 tablet  11  . Multiple Vitamin (MULTIVITAMIN) tablet Take 1 tablet by mouth daily.        Marland Kitchen omega-3 acid ethyl esters (LOVAZA) 1 G capsule Take 2 capsules (2 g total) by mouth 2 (two) times daily.  120 capsule  11  . propafenone (RYTHMOL SR) 325 MG 12 hr capsule Take 1 capsule (325 mg total) by mouth 2 (two) times daily.  60 capsule  12    Allergies  Allergen Reactions  . Crestor (Rosuvastatin Calcium)   . Lipitor (Atorvastatin Calcium)     Patient Active Problem List  Diagnosis  . HTN (hypertension)  . PAF (paroxysmal atrial fibrillation)  . High risk medication use  . Hypothyroidism    History    Smoking status  . Never Smoker   Smokeless tobacco  . Not on file    History  Alcohol Use  . Yes    Comment: 4X WEEK    Family History  Problem Relation Age of Onset  . Hypertension Mother   . Hyperlipidemia Mother   . Heart attack Maternal Grandfather     Review of Systems: The patient denies any heat or cold intolerance.  No weight gain or weight loss.  The patient denies headaches or blurry vision.  There is no cough or sputum production.  The patient denies dizziness.  There is no hematuria or hematochezia.  The patient denies any muscle aches or arthritis.  The patient denies any rash.  The patient denies frequent falling or instability.  There is no history of depression or anxiety.  All other systems were reviewed and are negative.   Physical Exam: Filed Vitals:   01/27/12 1123  BP: 132/80  Pulse: 63   the general appearance reveals a well-developed well-nourished gentleman in no distress.The head and neck exam reveals pupils equal and reactive.  Extraocular movements are full.  There is no scleral icterus.  The mouth and pharynx are normal.  The neck is supple.  The carotids reveal no bruits.  The jugular venous pressure is normal.  The  thyroid is not enlarged.  There is no lymphadenopathy.  The chest is clear to percussion and auscultation.  There are no rales or rhonchi.  Expansion of the chest is symmetrical.  There is no chest wall tenderness. The precordium is quiet.  The first heart sound is normal.  The second heart sound is physiologically split.  There is no murmur gallop rub or click.  There is no abnormal lift or heave.  The abdomen is soft and nontender.  The bowel sounds are normal.  The liver and spleen are not enlarged.  There are no abdominal masses.  There are no abdominal bruits.  Extremities reveal good pedal pulses.  There is no phlebitis or edema.  There is no cyanosis or clubbing.  Strength is normal and symmetrical in all extremities.  There is no  lateralizing weakness.  There are no sensory deficits.  The skin is warm and dry.  There is no rash.   EKG today shows normal sinus rhythm and low voltage criteria for LVH  Assessment / Plan: The etiology of his chest tightness is unknown.  He has multiple risk factors for premature coronary disease.  We will have him continue his same medication and return soon for a treadmill Myoview stress test.  He will omit his metoprolol on the morning of the procedure but continue his other antiarrhythmic drugs. Recheck for his regular office visit in several months as scheduled.

## 2012-01-30 ENCOUNTER — Encounter: Payer: Self-pay | Admitting: Cardiology

## 2012-02-06 ENCOUNTER — Ambulatory Visit (HOSPITAL_COMMUNITY): Payer: BC Managed Care – PPO | Attending: Cardiology | Admitting: Radiology

## 2012-02-06 VITALS — BP 136/100 | Ht 75.5 in | Wt 245.0 lb

## 2012-02-06 DIAGNOSIS — R0789 Other chest pain: Secondary | ICD-10-CM | POA: Insufficient documentation

## 2012-02-06 DIAGNOSIS — I4949 Other premature depolarization: Secondary | ICD-10-CM

## 2012-02-06 DIAGNOSIS — R0602 Shortness of breath: Secondary | ICD-10-CM

## 2012-02-06 DIAGNOSIS — R079 Chest pain, unspecified: Secondary | ICD-10-CM

## 2012-02-06 DIAGNOSIS — Z8249 Family history of ischemic heart disease and other diseases of the circulatory system: Secondary | ICD-10-CM | POA: Insufficient documentation

## 2012-02-06 DIAGNOSIS — R002 Palpitations: Secondary | ICD-10-CM | POA: Insufficient documentation

## 2012-02-06 DIAGNOSIS — I10 Essential (primary) hypertension: Secondary | ICD-10-CM | POA: Insufficient documentation

## 2012-02-06 MED ORDER — TECHNETIUM TC 99M SESTAMIBI GENERIC - CARDIOLITE
30.0000 | Freq: Once | INTRAVENOUS | Status: AC | PRN
  Administered 2012-02-06: 30 via INTRAVENOUS

## 2012-02-06 MED ORDER — TECHNETIUM TC 99M SESTAMIBI GENERIC - CARDIOLITE
10.0000 | Freq: Once | INTRAVENOUS | Status: AC | PRN
  Administered 2012-02-06: 10 via INTRAVENOUS

## 2012-02-06 NOTE — Progress Notes (Signed)
[facility] SITE 3 NUCLEAR MED [address] [place], [place] [address] [phone]    Cardiology Nuclear Med Study  Isaiah [name] is a 52 y.o. male     MRN : [id number]     DOB: Jan 15, 1961  Procedure Date: 02/06/2012  Nuclear Med Background Indication for Stress Test:  Evaluation for Ischemia History: AFIB,'06 Echo:EF=50-55%;'11MPS:EF=59% and normal; No prior known history of CAD Cardiac Risk Factors: Family History - CAD, Hypertension and Lipids  Symptoms:  Chest Tightness (last date of chest discomfort 2 weeks ago) and Palpitations   Nuclear Pre-Procedure Caffeine/Decaff Intake:  None > 12 hrs NPO After: 8:00pm   Lungs:  clear  IV 0.9% NS with Angio Cath:  20g  IV Site: R Wrist x 1, tolerated well IV Started by:  Irean Hong, RN  Chest Size (in):  50 Cup Size: n/a  Height: 6' 3.5" (1.918 m)  Weight:  245 lb (111.131 kg)  BMI:  Body mass index is 30.22 kg/(m^2). Tech Comments:  Held Toprol x 24 hrs    Nuclear Med Study 1 or 2 day study: 1 day  Stress Test Type:  Stress  Reading MD: Marca Ancona, MD  Order Authorizing Provider:  Cassell Clement, MD  Resting Radionuclide: Technetium 94m Sestamibi  Resting Radionuclide Dose: 11.0 mCi   Stress Radionuclide:  Technetium 31m Sestamibi  Stress Radionuclide Dose: 33.0 mCi           Stress Protocol Rest HR: 73 Stress HR: 155  Rest BP: 136/100 Stress BP: 184/95  Exercise Time (min): 10:00 METS: 11.7   Predicted Max HR: 171 bpm % Max HR: 90.64 bpm Rate Pressure Product: 57846    Dose of Adenosine (mg):  n/a Dose of Lexiscan: n/a mg  Dose of Atropine (mg): n/a Dose of Dobutamine: n/a mcg/kg/min (at max HR)  Stress Test Technologist: Cathlyn Parsons, RN  Nuclear Technologist:  Domenic Polite, CNMT     Rest Procedure:  Myocardial perfusion imaging was performed at rest 45 minutes following the intravenous administration of Technetium 30m Sestamibi. Rest ECG: NSR - Normal EKG  Stress Procedure:  The  patient exercised on the treadmill utilizing the Bruce Protocol for 10:00 minutes. The patient stopped due to exertion level and target HR achieved and denied any chest pain.  Technetium 55m Sestamibi was injected at peak exercise and myocardial perfusion imaging was performed after a brief delay. Stress ECG: No significant change from baseline ECG  QPS Raw Data Images:  Normal; no motion artifact; normal heart/lung ratio. Stress Images:  Normal homogeneous uptake in all areas of the myocardium. Rest Images:  Normal homogeneous uptake in all areas of the myocardium. Subtraction (SDS):  There is no evidence of scar or ischemia. Transient Ischemic Dilatation (Normal <1.22):  0.84 Lung/Heart Ratio (Normal <0.45):  0.35  Quantitative Gated Spect Images QGS EDV:  139 ml QGS ESV:  65 ml  Impression Exercise Capacity:  Good exercise capacity. BP Response:  Normal blood pressure response. Clinical Symptoms:  Mild dyspnea ECG Impression:  No significant ST segment change suggestive of ischemia. Comparison with Prior Nuclear Study: No significant change from previous study  Overall Impression:  Low risk stress nuclear study.  No evidence for ischemia or infarction.  EF mildly decreased.   LV Ejection Fraction: 53%.  LV Wall Motion:  Mild global hypokinesis.   Marca Ancona 02/06/2012

## 2012-02-08 ENCOUNTER — Telehealth: Payer: Self-pay | Admitting: Cardiology

## 2012-02-08 NOTE — Telephone Encounter (Signed)
Pt had stress test and would results

## 2012-02-08 NOTE — Telephone Encounter (Signed)
F/U    Rtn call back to nurse  

## 2012-02-08 NOTE — Telephone Encounter (Signed)
Spoke with pt, aware Celine Ahr looks okay to me but will call back once reviewed by dr Patty Sermons.

## 2012-02-08 NOTE — Telephone Encounter (Signed)
Left message for pt to call.

## 2012-02-09 NOTE — Telephone Encounter (Signed)
Please report.  The nuclear stress test was normal and did not show any evidence of ischemia.  The ejection fraction was 53%.  The patient is to continue his current therapy.

## 2012-02-09 NOTE — Telephone Encounter (Signed)
Advised patient

## 2012-02-24 ENCOUNTER — Other Ambulatory Visit: Payer: BC Managed Care – PPO

## 2012-03-09 ENCOUNTER — Other Ambulatory Visit: Payer: BC Managed Care – PPO

## 2012-03-09 ENCOUNTER — Ambulatory Visit: Payer: BC Managed Care – PPO | Admitting: Cardiology

## 2012-03-30 ENCOUNTER — Other Ambulatory Visit (INDEPENDENT_AMBULATORY_CARE_PROVIDER_SITE_OTHER): Payer: BC Managed Care – PPO

## 2012-03-30 ENCOUNTER — Encounter: Payer: Self-pay | Admitting: Cardiology

## 2012-03-30 ENCOUNTER — Ambulatory Visit (INDEPENDENT_AMBULATORY_CARE_PROVIDER_SITE_OTHER): Payer: BC Managed Care – PPO | Admitting: Cardiology

## 2012-03-30 VITALS — BP 128/92 | HR 87 | Ht 75.0 in | Wt 249.8 lb

## 2012-03-30 DIAGNOSIS — I119 Hypertensive heart disease without heart failure: Secondary | ICD-10-CM

## 2012-03-30 DIAGNOSIS — I10 Essential (primary) hypertension: Secondary | ICD-10-CM

## 2012-03-30 DIAGNOSIS — R079 Chest pain, unspecified: Secondary | ICD-10-CM

## 2012-03-30 DIAGNOSIS — I4891 Unspecified atrial fibrillation: Secondary | ICD-10-CM

## 2012-03-30 DIAGNOSIS — I480 Paroxysmal atrial fibrillation: Secondary | ICD-10-CM

## 2012-03-30 DIAGNOSIS — E7800 Pure hypercholesterolemia, unspecified: Secondary | ICD-10-CM

## 2012-03-30 LAB — HEPATIC FUNCTION PANEL
ALT: 35 U/L (ref 0–53)
AST: 31 U/L (ref 0–37)
Albumin: 3.8 g/dL (ref 3.5–5.2)
Total Protein: 7.2 g/dL (ref 6.0–8.3)

## 2012-03-30 LAB — BASIC METABOLIC PANEL
BUN: 13 mg/dL (ref 6–23)
Chloride: 103 mEq/L (ref 96–112)
Glucose, Bld: 102 mg/dL — ABNORMAL HIGH (ref 70–99)
Potassium: 3.9 mEq/L (ref 3.5–5.1)

## 2012-03-30 LAB — LIPID PANEL: Cholesterol: 194 mg/dL (ref 0–200)

## 2012-03-30 NOTE — Patient Instructions (Addendum)
Your physician recommends that you continue on your current medications as directed. Please refer to the Current Medication list given to you today.  Your physician wants you to follow-up in: 4 months with fasting labs (lp/bmet/hfp) You will receive a reminder letter in the mail two months in advance. If you don't receive a letter, please call our office to schedule the follow-up appointment.  

## 2012-03-30 NOTE — Assessment & Plan Note (Signed)
No further chest pain since last seen

## 2012-03-30 NOTE — Assessment & Plan Note (Signed)
The patient has had no recurrence of his paroxysmal atrial fibrillation.  He remains on metoprolol and propafenone.

## 2012-03-30 NOTE — Progress Notes (Signed)
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 He has a past history of paroxysmal atrial fibrillation. He also has a history of hypothyroidism and a history of hypercholesterolemia and history of essential hypertension. Since last visit he has been doing well with no new cardiac symptoms. Patient has a history of hypothyroidism and sees Dr. Evlyn Kanner.  In January 2014 the patient was experiencing some chest tightness and underwent a treadmill Myoview stress test which showed no evidence of ischemia and his ejection fraction was 53% with no wall motion abnormalities.  The chest tightness was occurring in the setting of a lot of emotional distress.  The symptoms seem to have improved since the normal stress test.  Current Outpatient Prescriptions  Medication Sig Dispense Refill  . ALPRAZolam (XANAX) 0.25 MG tablet Take 1 tablet (0.25 mg total) by mouth 2 (two) times daily.  100 tablet  2  . amLODipine (NORVASC) 5 MG tablet Take 1 tablet (5 mg total) by mouth daily.  30 tablet  11  . aspirin 325 MG tablet Take 325 mg by mouth 2 (two) times daily.       Marland Kitchen ezetimibe (ZETIA) 10 MG tablet Take 1 tablet (10 mg total) by mouth daily.  30 tablet  11  . fluocinonide cream (LIDEX) 0.05 % Apply 1 application topically daily.       Marland Kitchen levothyroxine (SYNTHROID) 200 MCG tablet Take 200 mcg by mouth daily.        Marland Kitchen lisinopril (PRINIVIL,ZESTRIL) 20 MG tablet Take 1 tablet (20 mg total) by mouth 2 (two) times daily.  60 tablet  3  . metoprolol succinate (TOPROL-XL) 25 MG 24 hr tablet Take 1 tablet (25 mg total) by mouth daily.  30 tablet  11  . Multiple Vitamin (MULTIVITAMIN) tablet Take 1 tablet by mouth daily.        Marland Kitchen omega-3 acid ethyl esters (LOVAZA) 1 G capsule Take 2 capsules (2 g total) by mouth 2 (two) times daily.   120 capsule  11  . propafenone (RYTHMOL SR) 325 MG 12 hr capsule Take 1 capsule (325 mg total) by mouth 2 (two) times daily.  60 capsule  12   No current facility-administered medications for this visit.    Allergies  Allergen Reactions  . Crestor (Rosuvastatin Calcium)   . Lipitor (Atorvastatin Calcium)     Patient Active Problem List  Diagnosis  . HTN (hypertension)  . PAF (paroxysmal atrial fibrillation)  . High risk medication use  . Hypothyroidism  . Chest pain    History  Smoking status  . Never Smoker   Smokeless tobacco  . Not on file    History  Alcohol Use  . Yes    Comment: 4X WEEK    Family History  Problem Relation Age of Onset  . Hypertension Mother   . Hyperlipidemia Mother   . Heart attack Maternal Grandfather     Review of Systems: The patient denies any heat or cold intolerance.  No weight gain or weight loss.  The patient denies headaches or blurry vision.  There is no cough or sputum production.  The patient denies dizziness.  There is no hematuria or hematochezia.  The patient denies any muscle aches or arthritis.  The patient denies any rash.  The patient denies frequent falling or instability.  There is no history of depression or anxiety.  All other systems were reviewed and are negative.   Physical Exam: Filed Vitals:   03/30/12 1145  BP: 128/92  Pulse: 87   the general appearance reveals a well-developed well-nourished gentleman in no distress.The head and neck exam reveals pupils equal and reactive.  Extraocular movements are full.  There is no scleral icterus.  The mouth and pharynx are normal.  The neck is supple.  The carotids reveal no bruits.  The jugular venous pressure is normal.  The  thyroid is not enlarged.  There is no lymphadenopathy.  The chest is clear to percussion and auscultation.  There are no rales or rhonchi.  Expansion of the chest is symmetrical.  The precordium is quiet.  The first heart sound is normal.  The second  heart sound is physiologically split.  There is no murmur gallop rub or click.  There is no abnormal lift or heave.  The abdomen is soft and nontender.  The bowel sounds are normal.  The liver and spleen are not enlarged.  There are no abdominal masses.  There are no abdominal bruits.  Extremities reveal good pedal pulses.  There is no phlebitis or edema.  There is no cyanosis or clubbing.  Strength is normal and symmetrical in all extremities.  There is no lateralizing weakness.  There are no sensory deficits.  The skin is warm and dry.  There is no rash.      Assessment / Plan: Continue same medication.  Recheck in 4 months for office visit lipid panel hepatic function panel and basal metabolic panel.  Dr. Evlyn Kanner checks his thyroid function studies.

## 2012-03-30 NOTE — Progress Notes (Signed)
Quick Note:  Please report to patient. The recent labs are stable. Continue same medication and careful diet. The LDL is higher from inactivity. Watch diet better resuming aerobic activity. Continue same medication ______

## 2012-03-30 NOTE — Assessment & Plan Note (Signed)
The patient has not been experiencing any dizziness or syncope.  He has been inactive over the past month because of a left shoulder injury and as a result his blood pressure today is a little higher.  He hopes to get back into exercise regimen soon.

## 2012-04-02 ENCOUNTER — Telehealth: Payer: Self-pay | Admitting: *Deleted

## 2012-04-02 NOTE — Telephone Encounter (Signed)
Message copied by Burnell Blanks on Mon Apr 02, 2012  3:33 PM ------      Message from: Cassell Clement      Created: Fri Mar 30, 2012  6:04 PM       Please report to patient.  The recent labs are stable. Continue same medication and careful diet.  The LDL is higher from inactivity.  Watch diet better resuming aerobic activity.  Continue same medication ------

## 2012-04-02 NOTE — Telephone Encounter (Signed)
Mailed copy of labs and left message to call if any questions  

## 2012-04-26 LAB — HM COLONOSCOPY

## 2012-05-15 ENCOUNTER — Other Ambulatory Visit: Payer: Self-pay | Admitting: *Deleted

## 2012-05-15 MED ORDER — OMEGA-3-ACID ETHYL ESTERS 1 G PO CAPS: 2.0000 g | ORAL_CAPSULE | Freq: Two times a day (BID) | ORAL | Status: DC

## 2012-05-22 ENCOUNTER — Other Ambulatory Visit: Payer: Self-pay | Admitting: *Deleted

## 2012-05-22 MED ORDER — LISINOPRIL 20 MG PO TABS: 20.0000 mg | ORAL_TABLET | Freq: Two times a day (BID) | ORAL | Status: DC

## 2012-05-30 ENCOUNTER — Other Ambulatory Visit: Payer: Self-pay | Admitting: *Deleted

## 2012-05-30 DIAGNOSIS — F419 Anxiety disorder, unspecified: Secondary | ICD-10-CM

## 2012-05-30 MED ORDER — ALPRAZOLAM 0.25 MG PO TABS: 0.2500 mg | ORAL_TABLET | Freq: Two times a day (BID) | ORAL | Status: DC

## 2012-07-18 ENCOUNTER — Other Ambulatory Visit: Payer: Self-pay | Admitting: *Deleted

## 2012-07-18 DIAGNOSIS — E7800 Pure hypercholesterolemia, unspecified: Secondary | ICD-10-CM

## 2012-07-18 MED ORDER — EZETIMIBE 10 MG PO TABS: 10.0000 mg | ORAL_TABLET | Freq: Every day | ORAL | Status: DC

## 2012-07-30 ENCOUNTER — Encounter (INDEPENDENT_AMBULATORY_CARE_PROVIDER_SITE_OTHER): Payer: BC Managed Care – PPO

## 2012-07-30 ENCOUNTER — Other Ambulatory Visit: Payer: BC Managed Care – PPO

## 2012-07-30 ENCOUNTER — Telehealth: Payer: Self-pay | Admitting: Cardiology

## 2012-07-30 DIAGNOSIS — I82403 Acute embolism and thrombosis of unspecified deep veins of lower extremity, bilateral: Secondary | ICD-10-CM

## 2012-07-30 DIAGNOSIS — M79609 Pain in unspecified limb: Secondary | ICD-10-CM

## 2012-07-30 DIAGNOSIS — I8000 Phlebitis and thrombophlebitis of superficial vessels of unspecified lower extremity: Secondary | ICD-10-CM

## 2012-07-30 DIAGNOSIS — Z86718 Personal history of other venous thrombosis and embolism: Secondary | ICD-10-CM

## 2012-07-30 DIAGNOSIS — R609 Edema, unspecified: Secondary | ICD-10-CM

## 2012-07-30 NOTE — Telephone Encounter (Signed)
New Problem  Pt believes that he has DVTs in both legs.

## 2012-07-30 NOTE — Telephone Encounter (Signed)
Pt states that the back of both calf are very painfull, swollen, has redness painful to touch; the pain is on inner side of both legs, knees and up to his groins. Pt stands for long periods of time, and has a history of DVT. Norma Fredrickson NP aware of symptoms and recommended for pt to have to have a Bilateral Venus duplex. Order is in to be done is this office. Pt aware of NP's recommendations, and appointment date and time.

## 2012-07-30 NOTE — Telephone Encounter (Signed)
Pt had Bilateral Venus Doppler at noon today (08/09/12). Norma Fredrickson aware.

## 2012-07-30 NOTE — Telephone Encounter (Signed)
Dr Gwendolyn Grant called because he think he has DVT in both legs . Pt said has the same symptoms he had before when he had the DVT.

## 2012-07-31 NOTE — Telephone Encounter (Signed)
Left message to call back  

## 2012-07-31 NOTE — Telephone Encounter (Signed)
New Prob     Pt would like to speak to nurse regarding treatment for his DVTs. Please call.

## 2012-07-31 NOTE — Telephone Encounter (Signed)
Spoke with patient, per Dr Clifton James recommendations (noted on preliminary report) continue ASA therapy and will see  Dr. Patty Sermons next week.   Impressions per preliminary report:  No evidence of lower extremity DVT or incompetence, bilaterally, chronic, or acute.  Dilated popliteal veins, bilaterally, of unknown etiology.  Probable sub-acute thrombosis of bilateral lesser saphenous veins without communication to the deep system.

## 2012-07-31 NOTE — Telephone Encounter (Signed)
Scheduled appointment for patient to see  Dr. Patty Sermons next week

## 2012-08-10 ENCOUNTER — Ambulatory Visit (INDEPENDENT_AMBULATORY_CARE_PROVIDER_SITE_OTHER): Payer: BC Managed Care – PPO | Admitting: Cardiology

## 2012-08-10 ENCOUNTER — Ambulatory Visit: Payer: BC Managed Care – PPO | Admitting: Cardiology

## 2012-08-10 ENCOUNTER — Other Ambulatory Visit (INDEPENDENT_AMBULATORY_CARE_PROVIDER_SITE_OTHER): Payer: BC Managed Care – PPO

## 2012-08-10 ENCOUNTER — Encounter: Payer: Self-pay | Admitting: Cardiology

## 2012-08-10 VITALS — BP 126/76 | HR 76 | Ht 75.0 in | Wt 242.3 lb

## 2012-08-10 DIAGNOSIS — I4891 Unspecified atrial fibrillation: Secondary | ICD-10-CM

## 2012-08-10 DIAGNOSIS — E7800 Pure hypercholesterolemia, unspecified: Secondary | ICD-10-CM

## 2012-08-10 DIAGNOSIS — I8003 Phlebitis and thrombophlebitis of superficial vessels of lower extremities, bilateral: Secondary | ICD-10-CM

## 2012-08-10 DIAGNOSIS — I8000 Phlebitis and thrombophlebitis of superficial vessels of unspecified lower extremity: Secondary | ICD-10-CM | POA: Insufficient documentation

## 2012-08-10 DIAGNOSIS — I480 Paroxysmal atrial fibrillation: Secondary | ICD-10-CM

## 2012-08-10 LAB — LIPID PANEL
Cholesterol: 189 mg/dL (ref 0–200)
HDL: 38.3 mg/dL — ABNORMAL LOW (ref >39.00)
LDL Cholesterol: 140 mg/dL — ABNORMAL HIGH (ref 0–99)
Total CHOL/HDL Ratio: 5
Triglycerides: 53 mg/dL (ref 0.0–149.0)
VLDL: 10.6 mg/dL (ref 0.0–40.0)

## 2012-08-10 LAB — BASIC METABOLIC PANEL
CO2: 30 mEq/L (ref 19–32)
Chloride: 103 mEq/L (ref 96–112)
Sodium: 138 mEq/L (ref 135–145)

## 2012-08-10 LAB — HEPATIC FUNCTION PANEL
Albumin: 3.8 g/dL (ref 3.5–5.2)
Total Protein: 7.1 g/dL (ref 6.0–8.3)

## 2012-08-10 MED ORDER — OMEPRAZOLE 40 MG PO CPDR: 40.0000 mg | DELAYED_RELEASE_CAPSULE | Freq: Every day | ORAL | Status: DC

## 2012-08-10 MED ORDER — MELOXICAM 15 MG PO TABS: 15.0000 mg | ORAL_TABLET | Freq: Every day | ORAL | Status: DC

## 2012-08-10 NOTE — Patient Instructions (Addendum)
START MOBIC AND OMEPRAZOLE, RX'S SENT TO PHARMACY   Your physician wants you to follow-up in: 4 months with fasting labs (lp/bmet/hfp) and EKG You will receive a reminder letter in the mail two months in advance. If you don't receive a letter, please call our office to schedule the follow-up appointment.

## 2012-08-10 NOTE — Assessment & Plan Note (Signed)
Patient has not had any recurrence of his paroxysmal atrial fibrillation

## 2012-08-10 NOTE — Progress Notes (Signed)
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 He has a past history of paroxysmal atrial fibrillation. He also has a history of hypothyroidism and a history of hypercholesterolemia and history of essential hypertension. Since last visit he has been doing well with no new cardiac symptoms. Patient has a history of hypothyroidism and sees Dr. Evlyn Kanner. In January 2014 the patient was experiencing some chest tightness and underwent a treadmill Myoview stress test which showed no evidence of ischemia and his ejection fraction was 53% with no wall motion abnormalities. The chest tightness was occurring in the setting of a lot of emotional distress. The symptoms seem to have improved since the normal stress test.  Recently he developed discomfort in his lower legs.  He has a past history of DVT.  He had a venous duplex of his legs on 07/30/12 which showed evidence of superficial phlebitis but no evidence of deep vein phlebitis or thrombosis.  There was bilateral lesser saphenous vein echogenic occlusive thrombus noted in the calves bilaterally.   Current Outpatient Prescriptions  Medication Sig Dispense Refill  . ALPRAZolam (XANAX) 0.25 MG tablet Take 1 tablet (0.25 mg total) by mouth 2 (two) times daily.  100 tablet  2  . amLODipine (NORVASC) 5 MG tablet Take 1 tablet (5 mg total) by mouth daily.  30 tablet  11  . aspirin 325 MG tablet Take 325 mg by mouth 2 (two) times daily.       Marland Kitchen ezetimibe (ZETIA) 10 MG tablet Take 1 tablet (10 mg total) by mouth daily.  30 tablet  1  . fluocinonide cream (LIDEX) 0.05 % Apply 1 application topically daily.       Marland Kitchen levothyroxine (SYNTHROID) 200 MCG tablet Take 200 mcg by mouth daily.        Marland Kitchen lisinopril (PRINIVIL,ZESTRIL) 20 MG tablet Take 1 tablet (20 mg total) by mouth 2  (two) times daily.  60 tablet  6  . metoprolol succinate (TOPROL-XL) 25 MG 24 hr tablet Take 1 tablet (25 mg total) by mouth daily.  30 tablet  11  . Multiple Vitamin (MULTIVITAMIN) tablet Take 1 tablet by mouth daily.        Marland Kitchen omega-3 acid ethyl esters (LOVAZA) 1 G capsule Take 2 capsules (2 g total) by mouth 2 (two) times daily.  120 capsule  11  . propafenone (RYTHMOL SR) 325 MG 12 hr capsule Take 1 capsule (325 mg total) by mouth 2 (two) times daily.  60 capsule  12  . meloxicam (MOBIC) 15 MG tablet Take 1 tablet (15 mg total) by mouth daily.  30 tablet  11  . omeprazole (PRILOSEC) 40 MG capsule Take 1 capsule (40 mg total) by mouth daily.  30 capsule  11   No current facility-administered medications for this visit.    Allergies  Allergen Reactions  . Crestor (Rosuvastatin Calcium)   . Lipitor (Atorvastatin Calcium)     Patient Active Problem List   Diagnosis Date Noted  . Chest pain 01/27/2012  . Hypothyroidism 12/17/2010  . HTN (hypertension) 10/01/2010  . PAF (paroxysmal atrial fibrillation) 10/01/2010  . High risk medication use 10/01/2010    History  Smoking status  . Never Smoker   Smokeless tobacco  . Not on file    History  Alcohol Use  . Yes  Comment: 4X WEEK    Family History  Problem Relation Age of Onset  . Hypertension Mother   . Hyperlipidemia Mother   . Heart attack Maternal Grandfather     Review of Systems: The patient denies any heat or cold intolerance.  No weight gain or weight loss.  The patient denies headaches or blurry vision.  There is no cough or sputum production.  The patient denies dizziness.  There is no hematuria or hematochezia.  The patient denies any muscle aches or arthritis.  The patient denies any rash.  The patient denies frequent falling or instability.  There is no history of depression or anxiety.  All other systems were reviewed and are negative.   Physical Exam: Filed Vitals:   08/10/12 1039  BP: 126/76  Pulse: 76    the general appearance reveals a well-developed well-nourished gentleman in no distress.The head and neck exam reveals pupils equal and reactive.  Extraocular movements are full.  There is no scleral icterus.  The mouth and pharynx are normal.  The neck is supple.  The carotids reveal no bruits.  The jugular venous pressure is normal.  The  thyroid is not enlarged.  There is no lymphadenopathy.  The chest is clear to percussion and auscultation.  There are no rales or rhonchi.  Expansion of the chest is symmetrical.  The precordium is quiet.  The first heart sound is normal.  The second heart sound is physiologically split.  There is no murmur gallop rub or click.  There is no abnormal lift or heave.  The abdomen is soft and nontender.  The bowel sounds are normal.  The liver and spleen are not enlarged.  There are no abdominal masses.  There are no abdominal bruits.  Extremities reveal good pedal pulses.  There is no edema.  Homans sign is negative.  Both calves are mildly tender.  There is no edema.  There is no cyanosis or clubbing.  Strength is normal and symmetrical in all extremities.  There is no lateralizing weakness.  There are no sensory deficits.  The skin is warm and dry.  There is no rash.      Assessment / Plan: Continue same medication.  Continue aspirin 325 mg daily and he is also on Mobic 15 mg daily and we have added omeprazole 40 mg daily while on Mobic. Recheck in 4 months for followup office visit EKG lipid panel hepatic function panel and basal metabolic panel

## 2012-08-10 NOTE — Assessment & Plan Note (Signed)
The patient will continue his daily 325 mg aspirin.  He will also be on Mobic 15 mg daily.  To protect his stomach he will take omeprazole while taking both aspirin and Mobic.

## 2012-08-13 NOTE — Progress Notes (Signed)
Quick Note:  Please report to patient. The recent labs are stable. Continue same medication and careful diet. ______ 

## 2012-08-16 ENCOUNTER — Encounter: Payer: Self-pay | Admitting: Cardiology

## 2012-09-04 HISTORY — PX: SHOULDER ARTHROSCOPY W/ ROTATOR CUFF REPAIR: SHX2400

## 2012-09-17 ENCOUNTER — Encounter (HOSPITAL_COMMUNITY): Payer: Self-pay | Admitting: *Deleted

## 2012-09-17 ENCOUNTER — Observation Stay (HOSPITAL_COMMUNITY)
Admission: EM | Admit: 2012-09-17 | Discharge: 2012-09-19 | Disposition: A | Discharge status: Home / Self Care | Payer: BC Managed Care – PPO | Attending: Internal Medicine | Admitting: Internal Medicine

## 2012-09-17 ENCOUNTER — Emergency Department (HOSPITAL_COMMUNITY): Payer: BC Managed Care – PPO

## 2012-09-17 DIAGNOSIS — Z86718 Personal history of other venous thrombosis and embolism: Secondary | ICD-10-CM | POA: Insufficient documentation

## 2012-09-17 DIAGNOSIS — I2699 Other pulmonary embolism without acute cor pulmonale: Principal | ICD-10-CM

## 2012-09-17 DIAGNOSIS — F411 Generalized anxiety disorder: Secondary | ICD-10-CM | POA: Insufficient documentation

## 2012-09-17 DIAGNOSIS — R079 Chest pain, unspecified: Secondary | ICD-10-CM | POA: Diagnosis present

## 2012-09-17 DIAGNOSIS — I10 Essential (primary) hypertension: Secondary | ICD-10-CM | POA: Diagnosis present

## 2012-09-17 DIAGNOSIS — Z79899 Other long term (current) drug therapy: Secondary | ICD-10-CM | POA: Insufficient documentation

## 2012-09-17 DIAGNOSIS — E039 Hypothyroidism, unspecified: Secondary | ICD-10-CM | POA: Diagnosis present

## 2012-09-17 DIAGNOSIS — E785 Hyperlipidemia, unspecified: Secondary | ICD-10-CM | POA: Insufficient documentation

## 2012-09-17 DIAGNOSIS — Z7982 Long term (current) use of aspirin: Secondary | ICD-10-CM | POA: Insufficient documentation

## 2012-09-17 DIAGNOSIS — I480 Paroxysmal atrial fibrillation: Secondary | ICD-10-CM | POA: Diagnosis present

## 2012-09-17 DIAGNOSIS — R0602 Shortness of breath: Secondary | ICD-10-CM | POA: Insufficient documentation

## 2012-09-17 DIAGNOSIS — I4891 Unspecified atrial fibrillation: Secondary | ICD-10-CM | POA: Insufficient documentation

## 2012-09-17 HISTORY — DX: Other specified postprocedural states: Z98.890

## 2012-09-17 HISTORY — DX: Acute embolism and thrombosis of unspecified deep veins of unspecified lower extremity: I82.409

## 2012-09-17 HISTORY — DX: Other pulmonary embolism without acute cor pulmonale: I26.99

## 2012-09-17 HISTORY — DX: Hypothyroidism, unspecified: E03.9

## 2012-09-17 HISTORY — DX: Nausea with vomiting, unspecified: R11.2

## 2012-09-17 LAB — BASIC METABOLIC PANEL
BUN: 17 mg/dL (ref 6–23)
CO2: 29 mEq/L (ref 19–32)
Calcium: 10 mg/dL (ref 8.4–10.5)
Chloride: 100 mEq/L (ref 96–112)
Creatinine, Ser: 1.21 mg/dL (ref 0.50–1.35)
GFR calc Af Amer: 79 mL/min — ABNORMAL LOW (ref >90)

## 2012-09-17 LAB — CBC
HCT: 44.9 % (ref 39.0–52.0)
MCH: 31.4 pg (ref 26.0–34.0)
MCV: 87 fL (ref 78.0–100.0)
Platelets: 201 10*3/uL (ref 150–400)
RDW: 12.5 % (ref 11.5–15.5)
WBC: 7.7 10*3/uL (ref 4.0–10.5)

## 2012-09-17 LAB — TROPONIN I: Troponin I: <0.3 ng/mL (ref <0.30)

## 2012-09-17 LAB — PROTIME-INR
INR: 1.08 (ref 0.00–1.49)
Prothrombin Time: 13.8 seconds (ref 11.6–15.2)

## 2012-09-17 LAB — PRO B NATRIURETIC PEPTIDE: Pro B Natriuretic peptide (BNP): 70.7 pg/mL (ref 0–125)

## 2012-09-17 MED ORDER — ENOXAPARIN SODIUM 120 MG/0.8ML ~~LOC~~ SOLN
105.0000 mg | Freq: Two times a day (BID) | SUBCUTANEOUS | Status: DC
  Administered 2012-09-18: 105 mg via SUBCUTANEOUS
  Filled 2012-09-17 (×3): qty 0.8

## 2012-09-17 MED ORDER — EZETIMIBE 10 MG PO TABS
10.0000 mg | ORAL_TABLET | Freq: Every evening | ORAL | Status: DC
  Administered 2012-09-17: 10 mg via ORAL
  Filled 2012-09-17 (×3): qty 1

## 2012-09-17 MED ORDER — ALBUTEROL SULFATE (5 MG/ML) 0.5% IN NEBU
2.5000 mg | INHALATION_SOLUTION | RESPIRATORY_TRACT | Status: AC
  Administered 2012-09-17: 2.5 mg via RESPIRATORY_TRACT
  Filled 2012-09-17: qty 0.5

## 2012-09-17 MED ORDER — LEVOTHYROXINE SODIUM 200 MCG PO TABS
200.0000 ug | ORAL_TABLET | Freq: Every day | ORAL | Status: DC
  Administered 2012-09-18: 200 ug via ORAL
  Filled 2012-09-17 (×4): qty 1

## 2012-09-17 MED ORDER — ENOXAPARIN SODIUM 120 MG/0.8ML ~~LOC~~ SOLN
110.0000 mg | Freq: Once | SUBCUTANEOUS | Status: AC
  Administered 2012-09-17: 110 mg via SUBCUTANEOUS
  Filled 2012-09-17 (×2): qty 0.8

## 2012-09-17 MED ORDER — ACETAMINOPHEN 325 MG PO TABS: 650.0000 mg | ORAL_TABLET | Freq: Four times a day (QID) | ORAL | Status: DC | PRN

## 2012-09-17 MED ORDER — AMLODIPINE BESYLATE 5 MG PO TABS
5.0000 mg | ORAL_TABLET | Freq: Every day | ORAL | Status: DC
  Filled 2012-09-17 (×2): qty 1

## 2012-09-17 MED ORDER — METOPROLOL SUCCINATE ER 25 MG PO TB24
25.0000 mg | ORAL_TABLET | Freq: Every morning | ORAL | Status: DC
Start: 2012-09-18 — End: 2012-09-19
  Filled 2012-09-17 (×2): qty 1

## 2012-09-17 MED ORDER — HYDROMORPHONE HCL PF 1 MG/ML IJ SOLN: 1.0000 mg | INTRAMUSCULAR | Status: AC | PRN

## 2012-09-17 MED ORDER — SODIUM CHLORIDE 0.9 % IJ SOLN
3.0000 mL | Freq: Two times a day (BID) | INTRAMUSCULAR | Status: DC
  Administered 2012-09-17 – 2012-09-18 (×3): 3 mL via INTRAVENOUS

## 2012-09-17 MED ORDER — OMEGA-3-ACID ETHYL ESTERS 1 G PO CAPS
2.0000 g | ORAL_CAPSULE | Freq: Two times a day (BID) | ORAL | Status: DC
  Administered 2012-09-17: 2 g via ORAL
  Filled 2012-09-17 (×5): qty 2

## 2012-09-17 MED ORDER — ONDANSETRON HCL 4 MG/2ML IJ SOLN: 4.0000 mg | Freq: Three times a day (TID) | INTRAMUSCULAR | Status: AC | PRN

## 2012-09-17 MED ORDER — IOHEXOL 350 MG/ML SOLN
75.0000 mL | Freq: Once | INTRAVENOUS | Status: AC | PRN
  Administered 2012-09-17: 75 mL via INTRAVENOUS

## 2012-09-17 MED ORDER — LISINOPRIL 20 MG PO TABS
20.0000 mg | ORAL_TABLET | Freq: Two times a day (BID) | ORAL | Status: DC
  Administered 2012-09-17: 20 mg via ORAL
  Filled 2012-09-17 (×6): qty 1

## 2012-09-17 MED ORDER — ONDANSETRON HCL 4 MG PO TABS: 4.0000 mg | ORAL_TABLET | Freq: Four times a day (QID) | ORAL | Status: DC | PRN

## 2012-09-17 MED ORDER — ONDANSETRON HCL 4 MG/2ML IJ SOLN: 4.0000 mg | Freq: Four times a day (QID) | INTRAMUSCULAR | Status: DC | PRN

## 2012-09-17 MED ORDER — ADULT MULTIVITAMIN W/MINERALS CH
1.0000 | ORAL_TABLET | Freq: Every day | ORAL | Status: DC
  Administered 2012-09-17 – 2012-09-18 (×2): 1 via ORAL
  Filled 2012-09-17 (×3): qty 1

## 2012-09-17 MED ORDER — SODIUM CHLORIDE 0.9 % IV BOLUS (SEPSIS)
1000.0000 mL | Freq: Once | INTRAVENOUS | Status: AC
  Administered 2012-09-17: 1000 mL via INTRAVENOUS

## 2012-09-17 MED ORDER — ACETAMINOPHEN 650 MG RE SUPP: 650.0000 mg | Freq: Four times a day (QID) | RECTAL | Status: DC | PRN

## 2012-09-17 MED ORDER — PROPAFENONE HCL ER 325 MG PO CP12
325.0000 mg | ORAL_CAPSULE | Freq: Two times a day (BID) | ORAL | Status: DC
  Filled 2012-09-17 (×5): qty 1

## 2012-09-17 MED ORDER — ALPRAZOLAM 0.25 MG PO TABS
0.2500 mg | ORAL_TABLET | Freq: Two times a day (BID) | ORAL | Status: DC
  Administered 2012-09-17: 0.25 mg via ORAL
  Filled 2012-09-17 (×2): qty 1

## 2012-09-17 NOTE — ED Provider Notes (Signed)
CSN: [id number]     Arrival date & time 09/17/12  1159 History   First MD Initiated Contact with Patient 09/17/12 1208     No chief complaint on file.  (Consider location/radiation/quality/duration/timing/severity/associated sxs/prior Treatment) HPI Comments: 52 yo male with htn, paf, dvt hx presents with pleuritic and exertional sob since last night.  Constant.  No known PE.  Mild hemoptysis.  Pt on asa.  A fib hx.  No cp.  Recent surgery 8/19 left shoulder.  Improves with rest.  No mi hx.  HTN. Hx.  No chold or FH cardiac.    The history is provided by the patient.    Past Medical History  Diagnosis Date  . PAF (paroxysmal atrial fibrillation)     no recurrence since Feb 2012  . Hypertension   . Thyroid disease   . Hyperlipidemia   . Situational stress   . Diastolic dysfunction     Grade I per echo in 2/12   Past Surgical History  Procedure Laterality Date  . Inguinal hernia repair  1992  . Cardiovascular stress test  07/17/2009    EF 59%  . Transthoracic echocardiogram  03/05/2010    EF 60-65%  . Shoulder surgery      left   Family History  Problem Relation Age of Onset  . Hypertension Mother   . Hyperlipidemia Mother   . Heart attack Maternal Grandfather    History  Substance Use Topics  . Smoking status: Never Smoker   . Smokeless tobacco: Not on file  . Alcohol Use: Yes     Comment: 4X WEEK    Review of Systems  Constitutional: Negative for fever and chills.  HENT: Negative for neck pain and neck stiffness.   Eyes: Negative for visual disturbance.  Respiratory: Positive for cough and shortness of breath.   Cardiovascular: Negative for chest pain.  Gastrointestinal: Negative for vomiting and abdominal pain.  Genitourinary: Negative for dysuria and flank pain.  Musculoskeletal: Negative for back pain.  Skin: Negative for rash.  Neurological: Negative for light-headedness and headaches.    Allergies  Crestor and Lipitor  Home Medications   Current  Outpatient Rx  Name  Route  Sig  Dispense  Refill  . ALPRAZolam (XANAX) 0.25 MG tablet   Oral   Take 1 tablet (0.25 mg total) by mouth 2 (two) times daily.   100 tablet   2   . amLODipine (NORVASC) 5 MG tablet   Oral   Take 1 tablet (5 mg total) by mouth daily.   30 tablet   11   . aspirin 325 MG tablet   Oral   Take 325 mg by mouth 2 (two) times daily.          Marland Kitchen ezetimibe (ZETIA) 10 MG tablet   Oral   Take 1 tablet (10 mg total) by mouth daily.   30 tablet   1   . fluocinonide cream (LIDEX) 0.05 %   Topical   Apply 1 application topically daily.          Marland Kitchen levothyroxine (SYNTHROID) 200 MCG tablet   Oral   Take 200 mcg by mouth daily.           Marland Kitchen lisinopril (PRINIVIL,ZESTRIL) 20 MG tablet   Oral   Take 1 tablet (20 mg total) by mouth 2 (two) times daily.   60 tablet   6   . meloxicam (MOBIC) 15 MG tablet   Oral   Take 1 tablet (15 mg  total) by mouth daily.   30 tablet   11   . metoprolol succinate (TOPROL-XL) 25 MG 24 hr tablet   Oral   Take 1 tablet (25 mg total) by mouth daily.   30 tablet   11   . Multiple Vitamin (MULTIVITAMIN) tablet   Oral   Take 1 tablet by mouth daily.           Marland Kitchen omega-3 acid ethyl esters (LOVAZA) 1 G capsule   Oral   Take 2 capsules (2 g total) by mouth 2 (two) times daily.   120 capsule   11   . omeprazole (PRILOSEC) 40 MG capsule   Oral   Take 1 capsule (40 mg total) by mouth daily.   30 capsule   11   . propafenone (RYTHMOL SR) 325 MG 12 hr capsule   Oral   Take 1 capsule (325 mg total) by mouth 2 (two) times daily.   60 capsule   12    BP 149/100  Pulse 89  Temp(Src) 97.9 F (36.6 C) (Oral)  Resp 20  SpO2 97% Physical Exam  Nursing note and vitals reviewed. Constitutional: He is oriented to person, place, and time. He appears well-developed and well-nourished.  HENT:  Head: Normocephalic and atraumatic.  Eyes: Conjunctivae are normal. Right eye exhibits no discharge. Left eye exhibits no  discharge.  Neck: Normal range of motion. Neck supple. No tracheal deviation present.  Cardiovascular: Normal rate, regular rhythm and intact distal pulses.   Pulmonary/Chest: Effort normal and breath sounds normal.  Abdominal: Soft. He exhibits no distension. There is no tenderness. There is no guarding.  Musculoskeletal: He exhibits no edema and no tenderness.  Neurological: He is alert and oriented to person, place, and time.  Skin: Skin is warm. No rash noted.  Psychiatric: He has a normal mood and affect.    ED Course  Procedures (including critical care time) Labs Review Labs Reviewed  CBC  BASIC METABOLIC PANEL  PRO B NATRIURETIC PEPTIDE  POCT I-STAT TROPONIN I   Imaging Review Dg Chest 2 View  09/17/2012   *RADIOLOGY REPORT*  Clinical Data: Shortness of breath  CHEST - 2 VIEW  Comparison: 05/21/2010  Findings: The cardiac shadow is stable.  The lungs are clear bilaterally.  No acute bony abnormality is seen.  IMPRESSION: No acute abnormality noted.   Original Report Authenticated By: Alcide Clever, M.D.    MDM  No diagnosis found. Pt moderate- high risk PE.  Well appearing on exam. COncern for PE.  CT scan and fluids ordered.  Date: 09/17/2012  Rate: 89  Rhythm: normal sinus rhythm  QRS Axis: normal  Intervals: normal  ST/T Wave abnormalities: normal  Conduction Disutrbances:none  Narrative Interpretation:   Old EKG Reviewed: unchanged  CT pending.  Signed out with plan to fup CT results, if pos admit with lovenox, if negative or no other acute findings discharge.   Rechecked, pt comfortable, no sxs.    Enid Skeens, MD 09/17/12 [phone]

## 2012-09-17 NOTE — Progress Notes (Signed)
ANTICOAGULATION CONSULT NOTE - Initial Consult  Pharmacy Consult for lovenox Indication: pulmonary embolus  Allergies  Allergen Reactions  . Crestor [Rosuvastatin Calcium]   . Lipitor [Atorvastatin Calcium]     Patient Measurements: Height: 6\' 3"  (190.5 cm) Weight: 232 lb (105.235 kg) IBW/kg (Calculated) : 84.5  Vital Signs: Temp: 97.8 F (36.6 C) (09/01 1818) Temp src: Oral (09/01 1818) BP: 158/93 mmHg (09/01 1818) Pulse Rate: 75 (09/01 1818)  Labs:  Recent Labs  09/17/12 1236  HGB 16.2  HCT 44.9  PLT 201  CREATININE 1.21    Estimated Creatinine Clearance: 95.9 ml/min (by C-G formula based on Cr of 1.21).   Medical History: Past Medical History  Diagnosis Date  . PAF (paroxysmal atrial fibrillation)     no recurrence since Feb 2012  . Hypertension   . Thyroid disease   . Hyperlipidemia   . Situational stress   . Diastolic dysfunction     Grade I per echo in 2/12   Assessment: 52 YOM with hx of multiple DVTs presented with chest pain, exertional SOB and mild hemoptysis. He had recent left shoulder surgery on 8/19. CTA reviewed extensive right pulmonary emboli. Smaller emboli in the left lower lobe, pharmacy is consulted to start full dose lovenox. Hgb 16.2, plt 201, scr 1.2, est. crcl ~ 75-85 ml/min, patient received one dose of lovenox 110 mg sq at 1850.  Goal of Therapy:  Anti-Xa level 0.6-1.2 units/ml 4hrs after LMWH dose given Monitor platelets by anticoagulation protocol: Yes   Plan:  - Lovenox 105mg  SQ 12 hrs next dose tomorrow at 0600 - Obtain baseline INR - f/u renal function, CBC Q 72 hrs while on lovenox - f/u plan for oral anticoagulation.  Bayard Hugger, PharmD, BCPS  Clinical Pharmacist  Pager: [phone]   09/17/2012,6:36 PM

## 2012-09-17 NOTE — ED Notes (Signed)
Pt had shoulder surgery on 8/19, then went to family reunion and drove to [place], history of blood clots in his leg.  Pt states that he frequently got out.  Pt states last nite started coughing.  Pt took walk last nite and was going up an incline and was short of breath.  Pt denies chest pain.  Pt has some blood tinged sputum with cough.  Pt takes asa only for blood thinner

## 2012-09-17 NOTE — H&P (Signed)
[facility] History and Physical  Isaiah [name] ZOX:[id number] DOB: Nov 17, 1960 DOA: 09/17/2012  Referring physician:  PCP: [name], MD  Specialists:   Chief Complaint: SOB, hemoptysis   HPI: Isaiah [name] is a 52 y.o. male with PMH of PAF (not on AC), HTN, HPL, anxiety, hypothyroidism, h/o multiple DVTs presented with pleuritic chest pain, and exertional sob since last night. Mild hemoptysis. Patient had a recent surgery 8/19 left shoulder. He reports recent long distance travel as well to [place]; denies, fever, no nausea, vomiting    Review of Systems: The patient denies anorexia, fever, weight loss,, vision loss, decreased hearing, hoarseness, syncope,  peripheral edema, balance deficits,  abdominal pain, melena, hematochezia, severe indigestion/heartburn, hematuria, incontinence, genital sores, muscle weakness, suspicious skin lesions, transient blindness, difficulty walking, depression, unusual weight change, abnormal bleeding, enlarged lymph nodes, angioedema, and breast masses.    Past Medical History  Diagnosis Date  . PAF (paroxysmal atrial fibrillation)     no recurrence since Feb 2012  . Hypertension   . Thyroid disease   . Hyperlipidemia   . Situational stress   . Diastolic dysfunction     Grade I per echo in 2/12   Past Surgical History  Procedure Laterality Date  . Inguinal hernia repair  1992  . Cardiovascular stress test  07/17/2009    EF 59%  . Transthoracic echocardiogram  03/05/2010    EF 60-65%  . Shoulder surgery      left   Social History:  reports that he has never smoked. He does not have any smokeless tobacco history on file. He reports that  drinks alcohol. He reports that he does not use illicit drugs. Lives at home Can patient participate in ADLs?yes   Allergies  Allergen Reactions  . Crestor [Rosuvastatin Calcium]   . Lipitor [Atorvastatin Calcium]     Family History  Problem Relation Age of Onset  . Hypertension Mother   .  Hyperlipidemia Mother   . Heart attack Maternal Grandfather     (be sure to complete)  Prior to Admission medications   Medication Sig Start Date End Date Taking? Authorizing Provider  ALPRAZolam (XANAX) 0.25 MG tablet Take 0.25 mg by mouth 2 (two) times daily.   Yes Historical Provider, MD  amLODipine (NORVASC) 5 MG tablet Take 5 mg by mouth daily.   Yes Historical Provider, MD  aspirin 325 MG tablet Take 325 mg by mouth 2 (two) times daily.    Yes Historical Provider, MD  ezetimibe (ZETIA) 10 MG tablet Take 10 mg by mouth every evening.   Yes Historical Provider, MD  levothyroxine (SYNTHROID, LEVOTHROID) 200 MCG tablet Take 200 mcg by mouth daily before breakfast.   Yes Historical Provider, MD  lisinopril (PRINIVIL,ZESTRIL) 20 MG tablet Take 20 mg by mouth 2 (two) times daily.   Yes Historical Provider, MD  metoprolol succinate (TOPROL-XL) 25 MG 24 hr tablet Take 25 mg by mouth every morning.   Yes Historical Provider, MD  Multiple Vitamin (MULTIVITAMIN) tablet Take 1 tablet by mouth daily.     Yes Historical Provider, MD  omega-3 acid ethyl esters (LOVAZA) 1 G capsule Take 2 g by mouth 2 (two) times daily.   Yes Historical Provider, MD  propafenone (RYTHMOL SR) 325 MG 12 hr capsule Take 325 mg by mouth 2 (two) times daily.   Yes Historical Provider, MD   Physical Exam: Filed Vitals:   09/17/12 1751  BP: 150/98  Pulse:   Temp:   Resp:  General:  Alert, oriented no distress   Eyes: perrla,   ENT: no oral ulcers   Neck: supple   Cardiovascular: s1, s2 RRR  Respiratory: CTA bl   Abdomen: soft, NT, ND, +BS   Skin: no rash, but few petechia in extremities   Musculoskeletal: no LE edema   Psychiatric: no hallucinations   Neurologic: no focal signs, CN2-12 intact   Labs on Admission:  Basic Metabolic Panel:  Recent Labs Lab 09/17/12 1236  NA 140  K 4.1  CL 100  CO2 29  GLUCOSE 80  BUN 17  CREATININE 1.21  CALCIUM 10.0   Liver Function Tests: No results  found for this basename: AST, ALT, ALKPHOS, BILITOT, PROT, ALBUMIN,  in the last 168 hours No results found for this basename: LIPASE, AMYLASE,  in the last 168 hours No results found for this basename: AMMONIA,  in the last 168 hours CBC:  Recent Labs Lab 09/17/12 1236  WBC 7.7  HGB 16.2  HCT 44.9  MCV 87.0  PLT 201   Cardiac Enzymes: No results found for this basename: CKTOTAL, CKMB, CKMBINDEX, TROPONINI,  in the last 168 hours  BNP (last 3 results)  Recent Labs  09/17/12 1236  PROBNP 70.7   CBG: No results found for this basename: GLUCAP,  in the last 168 hours  Radiological Exams on Admission: Dg Chest 2 View  09/17/2012   *RADIOLOGY REPORT*  Clinical Data: Shortness of breath  CHEST - 2 VIEW  Comparison: 05/21/2010  Findings: The cardiac shadow is stable.  The lungs are clear bilaterally.  No acute bony abnormality is seen.  IMPRESSION: No acute abnormality noted.   Original Report Authenticated By: Alcide Clever, M.D.   Ct Angio Chest Pe W/cm &/or Wo Cm  09/17/2012   CLINICAL DATA:  Recent shoulder surgery shortness of breath. Cough.  EXAM: CT ANGIOGRAPHY CHEST WITH CONTRAST  TECHNIQUE: Multidetector CT imaging of the chest was performed using the standard protocol during bolus administration of intravenous contrast. Multiplanar CT image reconstructions including MIPs were obtained to evaluate the vascular anatomy.  CONTRAST:  75mL OMNIPAQUE IOHEXOL 350 MG/ML SOLN  COMPARISON:  None.  FINDINGS: Large pulmonary embolus noted within the right pulmonary artery extending into all 3 lobes. Filling defects also noted in the left lower lobe pulmonary arteries.  Heart is normal size. Aorta is normal caliber. No mediastinal, hilar, or axillary adenopathy. Visualized thyroid and chest wall soft tissues unremarkable. Imaging into the upper abdomen shows no acute findings.  Patchy peripheral densities are noted in the right lung base. This could represent atelectasis or early pulmonary  infarcts. Left lung is clear. No effusions.  Review of the MIP images confirms the above findings.  IMPRESSION: Extensive right pulmonary emboli. Smaller emboli in the left lower lobe.  CriticalValue/emergent results were called by telephone at the time of interpretation on 09/17/2012 at 5:21 PMto Dr. Devoria Albe, who verbally acknowledged these results.   Electronically Signed   By: Charlett Nose   On: 09/17/2012 17:22    EKG: Independently reviewed. NSR, no acute ST changes   Assessment/Plan Active Problems:   PE (pulmonary embolism)   HTN (hypertension)   PAF (paroxysmal atrial fibrillation)   Hypothyroidism   Chest pain   52 y.o. male with PMH of PAF (not on AC), HTN, HPL, anxiety, hypothyroidism, h/o multiple DVTs presented with pleuritic chest pain, and exertional sob since last night. Mild hemoptysis. Patient had a recent surgery 8/19 left shoulder. He reports recent long distance  travel as well to [place]; denies, fever, no nausea, vomiting  -admitted with PE   1. Pulmonary embolism; +hemoptysis; hemodynamically stable, no hypoxia; trop neg, ECG unremarkable;  +h/o multiple DVT -CT: Extensive right pulmonary emboli. Smaller emboli in the left lower lobe. -start AC/lovenox; Patient will likely choose xarelto; would like to discuss;  -d/w patient/his wife at leangh risks and benefits of anticoagulation; and risk of bleeding and not availability of antidot for xarelto;  -Patient will likely need lifelong AC (multiple DVTs in the past);  -obtain echo, [place] of LE r/o DVT (if significant DVT may need temporary filter) on top of AC 2. PAF, currently in NSR;  -cont BB, rythmol  3. HTN not at goal ? Due to stress;  -resume BB; amlodipine, ACE; titrate per response  4. Hypothyroidism; no recent tsh;  -Resume levothyroxine, recheck tsh     Code Status: full  Family Communication: wife at the bedside  Disposition Plan: home  Time spent: > 35 minutes   Esperanza Sheets [facility] Pager [phone]  If 7PM-7AM, please contact night-coverage [email or web address] Password [email or web address] 09/17/2012, 6:00 PM

## 2012-09-17 NOTE — Progress Notes (Signed)
Called and received report at 1745.

## 2012-09-17 NOTE — ED Provider Notes (Signed)
This is a signout from Dr. Jodi Mourning at shift change. Plan is to follow up CT to rule out pulmonary embolism. Patient has multiple risk factors for PE, prior DVT. He presents today with shortness of breath and chest pain.   Patient seen and evaluated in his room, resting comfortably, in no acute distress, saturating well vital signs stable, lungs clear to auscultation bilaterally. Heart is a regular rate and rhythm with no murmurs rubs or gallops, abdominal exam is nontender.  Verbal report of multiple bilateral pulmonary embolisms from Dr. Kearney Hard appreciated. Patient will be given his first dose of Lovenox, admitted to telemetry bed under the care of Dr. York Spaniel.   Wynetta Emery, PA-C 09/17/12 2246  Wynetta Emery, PA-C 09/17/12 [phone]

## 2012-09-17 NOTE — Progress Notes (Signed)
Pt arrived to 2w20 and is stable and resting in bed.

## 2012-09-18 DIAGNOSIS — I4891 Unspecified atrial fibrillation: Secondary | ICD-10-CM

## 2012-09-18 DIAGNOSIS — I2699 Other pulmonary embolism without acute cor pulmonale: Principal | ICD-10-CM

## 2012-09-18 DIAGNOSIS — I10 Essential (primary) hypertension: Secondary | ICD-10-CM

## 2012-09-18 DIAGNOSIS — I517 Cardiomegaly: Secondary | ICD-10-CM

## 2012-09-18 DIAGNOSIS — E039 Hypothyroidism, unspecified: Secondary | ICD-10-CM

## 2012-09-18 LAB — ANTITHROMBIN III: AntiThromb III Func: 100 % (ref 75–120)

## 2012-09-18 LAB — TROPONIN I: Troponin I: <0.3 ng/mL (ref <0.30)

## 2012-09-18 MED ORDER — RIVAROXABAN 15 MG PO TABS
15.0000 mg | ORAL_TABLET | Freq: Two times a day (BID) | ORAL | Status: DC
  Administered 2012-09-18 – 2012-09-19 (×2): 15 mg via ORAL
  Filled 2012-09-18 (×4): qty 1

## 2012-09-18 MED ORDER — RIVAROXABAN 20 MG PO TABS: 20.0000 mg | ORAL_TABLET | Freq: Every day | ORAL | Status: DC

## 2012-09-18 NOTE — Progress Notes (Signed)
Brief Nutrition Note:  RD pulled to pt for malnutrition screening tool. Pt states he lost a little weight since he has not been able to work out, but this is normal for him. Appetite has been normal. Ate 100% of breakfast this morning, hungry and waiting for lunch.   Wt Readings from Last 5 Encounters:  09/17/12 232 lb (105.235 kg)  08/10/12 242 lb 4.8 oz (109.907 kg)  03/30/12 249 lb 12.8 oz (113.309 kg)  02/06/12 245 lb (111.131 kg)  01/27/12 248 lb 1.9 oz (112.546 kg)   Weight loss of 10 lbs in 2 months (4% body weight) not significant for time frame.   Body mass index is 29 kg/(m^2). overweight   Chart reviewed, no nutrition interventions warranted at this time. Please consult as needed.   Isaiah [name] RD, LDN Pager [phone] After Hours pager [phone]

## 2012-09-18 NOTE — Progress Notes (Addendum)
Patient was told by the night RN that the hospital did not have rythmol for his AFib. Patient was advised to have his girlfriend bring the medication. This morning when his girlfriend brought his medication, he took all his medications. When I came in to give his 10 o'clock medications he stated that he takes his meds at 6 am and 6 pm and he already missed a dose last night so he went ahead and took all his medications. I informed him that we have his rythmol and I will be giving him any medications he needs while he is in the hospital and that he is not supposed to take meds from home. I informed Dr. Isidoro Donning and she said she was aware. This evening at 6:17 pm I went in to give patient his medication and he said I took it already. Patient said that he will be leaving tomorrow and that he doesn't like taking his meds late. Lajuana Matte, RN

## 2012-09-18 NOTE — Care Management Note (Signed)
    Page 1 of 1   09/18/2012     4:53:45 PM   CARE MANAGEMENT NOTE 09/18/2012  Patient:  Isaiah [name], Isaiah [name]   Account Number:  [id number]  Date Initiated:  09/18/2012  Documentation initiated by:  Romano Stigger  Subjective/Objective Assessment:   PT ADM ON 09/17/12 WITH PULMONARY EMBOLUS.  PTA, PT INDEPENDENT, LIVES WITH SPOUSE.     Action/Plan:   PER [employer], COPAY IS $35 AT [facility]   Anticipated DC Date:  09/18/2012   Anticipated DC Plan:  HOME/SELF CARE      DC Planning Services  CM consult  Medication Assistance      Choice offered to / List presented to:             Status of service:  Completed, signed off Medicare Important Message given?   (If response is "NO", the following Medicare IM given date fields will be blank) Date Medicare IM given:   Date Additional Medicare IM given:    Discharge Disposition:  HOME/SELF CARE  Per UR Regulation:  Reviewed for med. necessity/level of care/duration of stay  If discussed at Long Length of Stay Meetings, dates discussed:    Comments:  09/18/12 Hisham Provence,RN,BSN [phone] PT GIVEN [employer] CARD TO REDUCE COPAY TO $5/MONTH.

## 2012-09-18 NOTE — Progress Notes (Signed)
*  PRELIMINARY RESULTS* Vascular Ultrasound Lower extremity venous duplex has been completed.  Preliminary findings: negative for DVT.    Farrel Demark, RDMS, RVT  09/18/2012, 9:13 AM

## 2012-09-18 NOTE — Progress Notes (Signed)
Patient ID: Isaiah [name]  male  ZOX:[id number]    DOB: 11-Apr-1960    DOA: 09/17/2012  PCP: [name], MD  Assessment/Plan:  Principal problem  Acute bilateral PE - CT angiogram of the chest showed Extensive right pulmonary emboli. Smaller emboli in the left lower lobe. - Patient has a prior history of DVT, had a recent surgery on the left shoulder last month and a long distance car travel to [place] and back - Currently on therapeutic Lovenox, discussed with the case manager, patient approved for xarelto, will start tonight, he has received dose of Lovenox this morning - Doppler ultrasound of the lower studies do not show any DVT -2-D echocardiogram results pending - Hypercoagulation panel pending, explained to the patient that he will benefit from repeating the hypercoagulation panel in 6 weeks.    Active Problems:   HTN (hypertension)- currently stable    PAF (paroxysmal atrial fibrillation) - On Rythmol, follows Dr. Patty Sermons, was on aspirin 325 mg BID, but now will be on xarelto     Hypothyroidism - Continue Synthroid  DVT Prophylaxis:On full dose anticoagulation   Code Status:  Disposition:DC home in a.m.    Subjective: States no chest pain at this moment, still has some shortness of breath on coughing   Objective: Weight change:   Intake/Output Summary (Last 24 hours) at 09/18/12 1156 Last data filed at 09/18/12 0810  Gross per 24 hour  Intake    360 ml  Output      0 ml  Net    360 ml   Blood pressure 142/88, pulse 76, temperature 98.7 F (37.1 C), temperature source Oral, resp. rate 18, height 6\' 3"  (1.905 m), weight 105.235 kg (232 lb), SpO2 95.00%.  Physical Exam: General: Alert and awake, oriented x3, not in any acute distress. CVS: S1-S2 clear, no murmur rubs or gallops Chest: clear to auscultation bilaterally, no wheezing, rales or rhonchi Abdomen: soft nontender, nondistended, normal bowel sounds  Extremities: no cyanosis, clubbing or edema  noted bilaterally Neuro: Cranial nerves II-XII intact, no focal neurological deficits  Lab Results: Basic Metabolic Panel:  Recent Labs Lab 09/17/12 1236  NA 140  K 4.1  CL 100  CO2 29  GLUCOSE 80  BUN 17  CREATININE 1.21  CALCIUM 10.0   Liver Function Tests: No results found for this basename: AST, ALT, ALKPHOS, BILITOT, PROT, ALBUMIN,  in the last 168 hours No results found for this basename: LIPASE, AMYLASE,  in the last 168 hours No results found for this basename: AMMONIA,  in the last 168 hours CBC:  Recent Labs Lab 09/17/12 1236  WBC 7.7  HGB 16.2  HCT 44.9  MCV 87.0  PLT 201   Cardiac Enzymes:  Recent Labs Lab 09/17/12 1850 09/18/12 0028 09/18/12 1020  TROPONINI <0.30 <0.30 <0.30   BNP: No components found with this basename: POCBNP,  CBG: No results found for this basename: GLUCAP,  in the last 168 hours   Micro Results: No results found for this or any previous visit (from the past 240 hour(s)).  Studies/Results: Dg Chest 2 View  09/17/2012   *RADIOLOGY REPORT*  Clinical Data: Shortness of breath  CHEST - 2 VIEW  Comparison: 05/21/2010  Findings: The cardiac shadow is stable.  The lungs are clear bilaterally.  No acute bony abnormality is seen.  IMPRESSION: No acute abnormality noted.   Original Report Authenticated By: Alcide Clever, M.D.   Ct Angio Chest Pe W/cm &/or Wo Cm  09/17/2012  CLINICAL DATA:  Recent shoulder surgery shortness of breath. Cough.  EXAM: CT ANGIOGRAPHY CHEST WITH CONTRAST  TECHNIQUE: Multidetector CT imaging of the chest was performed using the standard protocol during bolus administration of intravenous contrast. Multiplanar CT image reconstructions including MIPs were obtained to evaluate the vascular anatomy.  CONTRAST:  75mL OMNIPAQUE IOHEXOL 350 MG/ML SOLN  COMPARISON:  None.  FINDINGS: Large pulmonary embolus noted within the right pulmonary artery extending into all 3 lobes. Filling defects also noted in the left lower lobe  pulmonary arteries.  Heart is normal size. Aorta is normal caliber. No mediastinal, hilar, or axillary adenopathy. Visualized thyroid and chest wall soft tissues unremarkable. Imaging into the upper abdomen shows no acute findings.  Patchy peripheral densities are noted in the right lung base. This could represent atelectasis or early pulmonary infarcts. Left lung is clear. No effusions.  Review of the MIP images confirms the above findings.  IMPRESSION: Extensive right pulmonary emboli. Smaller emboli in the left lower lobe.  CriticalValue/emergent results were called by telephone at the time of interpretation on 09/17/2012 at 5:21 PMto Dr. Devoria Albe, who verbally acknowledged these results.   Electronically Signed   By: Charlett Nose   On: 09/17/2012 17:22    Medications: Scheduled Meds: . ALPRAZolam  0.25 mg Oral BID  . amLODipine  5 mg Oral Daily  . ezetimibe  10 mg Oral QPM  . levothyroxine  200 mcg Oral QAC breakfast  . lisinopril  20 mg Oral BID  . metoprolol succinate  25 mg Oral q morning - 10a  . multivitamin with minerals  1 tablet Oral Daily  . omega-3 acid ethyl esters  2 g Oral BID  . propafenone  325 mg Oral BID  . Rivaroxaban  15 mg Oral BID WC  . [START ON 10/10/2012] rivaroxaban  20 mg Oral Q supper  . sodium chloride  3 mL Intravenous Q12H      LOS: 1 day   RAI,RIPUDEEP M.D. [facility] 09/18/2012, 11:56 AM Pager: [phone]  If 7PM-7AM, please contact night-coverage [email or web address] Password [email or web address]

## 2012-09-18 NOTE — Progress Notes (Signed)
*  PRELIMINARY RESULTS* Echocardiogram 2D Echocardiogram has been performed.  Isaiah [name] 09/18/2012, 10:31 AM

## 2012-09-19 DIAGNOSIS — I10 Essential (primary) hypertension: Secondary | ICD-10-CM

## 2012-09-19 DIAGNOSIS — R079 Chest pain, unspecified: Secondary | ICD-10-CM

## 2012-09-19 DIAGNOSIS — I4891 Unspecified atrial fibrillation: Secondary | ICD-10-CM

## 2012-09-19 DIAGNOSIS — I2699 Other pulmonary embolism without acute cor pulmonale: Secondary | ICD-10-CM

## 2012-09-19 LAB — LUPUS ANTICOAGULANT PANEL
DRVVT: 46.3 secs — ABNORMAL HIGH (ref <42.9)
Lupus Anticoagulant: DETECTED — AB
PTTLA 4:1 Mix: 52.7 secs — ABNORMAL HIGH (ref 28.0–43.0)
dRVVT Incubated 1:1 Mix: 36.6 secs (ref <42.9)

## 2012-09-19 LAB — PROTEIN S, TOTAL: Protein S Ag, Total: 98 % (ref 60–150)

## 2012-09-19 LAB — PROTEIN C ACTIVITY: Protein C Activity: 200 % — ABNORMAL HIGH (ref 75–133)

## 2012-09-19 LAB — CARDIOLIPIN ANTIBODIES, IGG, IGM, IGA
Anticardiolipin IgA: 6 APL U/mL — ABNORMAL LOW (ref <22)
Anticardiolipin IgG: 8 GPL U/mL — ABNORMAL LOW (ref <23)
Anticardiolipin IgM: 4 MPL U/mL — ABNORMAL LOW (ref <11)

## 2012-09-19 LAB — BETA-2-GLYCOPROTEIN I ABS, IGG/M/A: Beta-2 Glyco I IgG: 4 G Units (ref <20)

## 2012-09-19 MED ORDER — RIVAROXABAN 15 MG PO TABS: 15.0000 mg | ORAL_TABLET | Freq: Two times a day (BID) | ORAL | Status: DC

## 2012-09-19 MED ORDER — RIVAROXABAN 20 MG PO TABS: 20.0000 mg | ORAL_TABLET | Freq: Every day | ORAL | Status: DC

## 2012-09-19 NOTE — Discharge Summary (Addendum)
Physician Discharge Summary  Isaiah [name] ZOX:[id number] DOB: Apr 30, 1960 DOA: 09/17/2012  PCP: [name], MD  Admit date: 09/17/2012 Discharge date: 09/19/2012  Time spent: 30 minutes  Recommendations for Outpatient Follow-up:  Follow up with PCP in 1-2 weeks Follow up hypercoagulable panel  Discharge Diagnoses:  Active Problems:   HTN (hypertension)   PAF (paroxysmal atrial fibrillation)   Hypothyroidism   Chest pain   PE (pulmonary embolism)   Discharge Condition: Stable  Diet recommendation: Regular  Filed Weights   09/17/12 1818  Weight: 105.235 kg (232 lb)    History of present illness:  Isaiah [name] is a 52 y.o. male with PMH of PAF (not on AC), HTN, HPL, anxiety, hypothyroidism, h/o multiple DVTs presented with pleuritic chest pain, and exertional sob since last night. Mild hemoptysis. Patient had a recent surgery 8/19 left shoulder. He reports recent long distance travel as well to [place]; denies, fever, no nausea, vomiting   Hospital Course:  The patient was admitted to the floor. He was started on therapeutic Xarelto, which he tolerated overnight. No bleeding noted. No LE DVT per LE dopplers. A hypercoagulable panel was obtained and is currently pending, to be followed up as an outpatient.  Discharge Exam: Filed Vitals:   09/18/12 0536 09/18/12 1415 09/18/12 2105 09/19/12 0419  BP: 142/88 129/89 139/94 130/83  Pulse: 76 68 65 59  Temp: 98.7 F (37.1 C) 98 F (36.7 C) 98.7 F (37.1 C) 98.3 F (36.8 C)  TempSrc: Oral Oral Oral Oral  Resp: 18 17 18 18   Height:      Weight:      SpO2: 95% 97% 94% 95%    General: Awake, in nad Cardiovascular: regular, s1, s2 Respiratory: normal resp effort, no wheezing  Discharge Instructions     Medication List         ALPRAZolam 0.25 MG tablet  Commonly known as:  XANAX  Take 0.25 mg by mouth 2 (two) times daily.     amLODipine 5 MG tablet  Commonly known as:  NORVASC  Take 5 mg by mouth daily.      aspirin 325 MG tablet  Take 325 mg by mouth 2 (two) times daily.     ezetimibe 10 MG tablet  Commonly known as:  ZETIA  Take 10 mg by mouth every evening.     levothyroxine 200 MCG tablet  Commonly known as:  SYNTHROID, LEVOTHROID  Take 200 mcg by mouth daily before breakfast.     lisinopril 20 MG tablet  Commonly known as:  PRINIVIL,ZESTRIL  Take 20 mg by mouth 2 (two) times daily.     metoprolol succinate 25 MG 24 hr tablet  Commonly known as:  TOPROL-XL  Take 25 mg by mouth every morning.     multivitamin tablet  Take 1 tablet by mouth daily.     omega-3 acid ethyl esters 1 G capsule  Commonly known as:  LOVAZA  Take 2 g by mouth 2 (two) times daily.     propafenone 325 MG 12 hr capsule  Commonly known as:  RYTHMOL SR  Take 325 mg by mouth 2 (two) times daily.     Rivaroxaban 15 MG Tabs tablet  Commonly known as:  XARELTO  Take 1 tablet (15 mg total) by mouth 2 (two) times daily with a meal.     Rivaroxaban 20 MG Tabs tablet  Commonly known as:  XARELTO  Take 1 tablet (20 mg total) by mouth daily with supper.  Start  taking on:  10/10/2012       Allergies  Allergen Reactions  . Crestor [Rosuvastatin Calcium]   . Lipitor [Atorvastatin Calcium]       The results of significant diagnostics from this hospitalization (including imaging, microbiology, ancillary and laboratory) are listed below for reference.    Significant Diagnostic Studies: Dg Chest 2 View  09/17/2012   *RADIOLOGY REPORT*  Clinical Data: Shortness of breath  CHEST - 2 VIEW  Comparison: 05/21/2010  Findings: The cardiac shadow is stable.  The lungs are clear bilaterally.  No acute bony abnormality is seen.  IMPRESSION: No acute abnormality noted.   Original Report Authenticated By: Alcide Clever, M.D.   Ct Angio Chest Pe W/cm &/or Wo Cm  09/17/2012   CLINICAL DATA:  Recent shoulder surgery shortness of breath. Cough.  EXAM: CT ANGIOGRAPHY CHEST WITH CONTRAST  TECHNIQUE: Multidetector CT imaging of  the chest was performed using the standard protocol during bolus administration of intravenous contrast. Multiplanar CT image reconstructions including MIPs were obtained to evaluate the vascular anatomy.  CONTRAST:  75mL OMNIPAQUE IOHEXOL 350 MG/ML SOLN  COMPARISON:  None.  FINDINGS: Large pulmonary embolus noted within the right pulmonary artery extending into all 3 lobes. Filling defects also noted in the left lower lobe pulmonary arteries.  Heart is normal size. Aorta is normal caliber. No mediastinal, hilar, or axillary adenopathy. Visualized thyroid and chest wall soft tissues unremarkable. Imaging into the upper abdomen shows no acute findings.  Patchy peripheral densities are noted in the right lung base. This could represent atelectasis or early pulmonary infarcts. Left lung is clear. No effusions.  Review of the MIP images confirms the above findings.  IMPRESSION: Extensive right pulmonary emboli. Smaller emboli in the left lower lobe.  CriticalValue/emergent results were called by telephone at the time of interpretation on 09/17/2012 at 5:21 PMto Dr. Devoria Albe, who verbally acknowledged these results.   Electronically Signed   By: Charlett Nose   On: 09/17/2012 17:22    Microbiology: No results found for this or any previous visit (from the past 240 hour(s)).   Labs: Basic Metabolic Panel:  Recent Labs Lab 09/17/12 1236  NA 140  K 4.1  CL 100  CO2 29  GLUCOSE 80  BUN 17  CREATININE 1.21  CALCIUM 10.0   Liver Function Tests: No results found for this basename: AST, ALT, ALKPHOS, BILITOT, PROT, ALBUMIN,  in the last 168 hours No results found for this basename: LIPASE, AMYLASE,  in the last 168 hours No results found for this basename: AMMONIA,  in the last 168 hours CBC:  Recent Labs Lab 09/17/12 1236  WBC 7.7  HGB 16.2  HCT 44.9  MCV 87.0  PLT 201   Cardiac Enzymes:  Recent Labs Lab 09/17/12 1850 09/18/12 0028 09/18/12 1020  TROPONINI <0.30 <0.30 <0.30   BNP: BNP  (last 3 results)  Recent Labs  09/17/12 1236  PROBNP 70.7   CBG: No results found for this basename: GLUCAP,  in the last 168 hours   Signed:  Lexxi Koslow K  [facility] 09/19/2012, 11:46 AM

## 2012-09-19 NOTE — Progress Notes (Signed)
Pt/family given discharge instructions, medication lists, follow up appointments, and when to call the doctor.  Pt/family verbalizes understanding. Pt anxious to leave because his ride needs to go to work.  Pt recently seen in hospital for rotator cuff repair and familiar with discharge instructions. Thomas Hoff

## 2012-09-21 ENCOUNTER — Other Ambulatory Visit: Payer: Self-pay | Admitting: *Deleted

## 2012-09-21 MED ORDER — EZETIMIBE 10 MG PO TABS: 10.0000 mg | ORAL_TABLET | Freq: Every evening | ORAL | Status: DC

## 2012-09-28 ENCOUNTER — Ambulatory Visit (INDEPENDENT_AMBULATORY_CARE_PROVIDER_SITE_OTHER): Payer: BC Managed Care – PPO | Admitting: Cardiology

## 2012-09-28 ENCOUNTER — Encounter: Payer: Self-pay | Admitting: Cardiology

## 2012-09-28 VITALS — BP 122/90 | HR 59 | Ht 75.0 in | Wt 232.4 lb

## 2012-09-28 DIAGNOSIS — I10 Essential (primary) hypertension: Secondary | ICD-10-CM

## 2012-09-28 DIAGNOSIS — I2699 Other pulmonary embolism without acute cor pulmonale: Secondary | ICD-10-CM

## 2012-09-28 DIAGNOSIS — R059 Cough, unspecified: Secondary | ICD-10-CM | POA: Insufficient documentation

## 2012-09-28 DIAGNOSIS — R05 Cough: Secondary | ICD-10-CM

## 2012-09-28 DIAGNOSIS — I480 Paroxysmal atrial fibrillation: Secondary | ICD-10-CM

## 2012-09-28 DIAGNOSIS — I4891 Unspecified atrial fibrillation: Secondary | ICD-10-CM

## 2012-09-28 NOTE — Assessment & Plan Note (Signed)
The patient has a tickling nonproductive dry cough.  He states that this started on the day he was found to have his pulmonary emboli.  The cough is still present and it may have improved slightly.  The patient is also on ACE inhibitor which may be contributing to the cough.  We could consider switching him to an ARB.  He would like to stay with his current medication for the time being and not make any other changes right now.  If the cough persists on and on we can revisit the question of the ACE inhibitor contribution

## 2012-09-28 NOTE — Progress Notes (Signed)
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  He was recently admitted with bilateral pulmonary emboli manifested by exertional dyspnea and cough and hemoptysis.  He has had a followup evaluation for hypercoagulability showing that he has an abnormal lupus anticoagulant.  He will need to stay on lifelong anticoagulation.  He has had past history of frequent DVT.  On this admission venous Dopplers did not localize any new areas of DVT He has a past history of paroxysmal atrial fibrillation. He also has a history of hypothyroidism and a history of hypercholesterolemia and history of essential hypertension.  Patient has a history of hypothyroidism and sees Dr. Evlyn Kanner. In January 2014 the patient experienced some chest tightness and underwent a treadmill Myoview stress test which showed no evidence of ischemia and his ejection fraction was 53% with no wall motion abnormalities. The chest tightness was occurring in the setting of a lot of emotional distress. The symptoms seem to have improved since the normal stress test.   Current Outpatient Prescriptions  Medication Sig Dispense Refill  . ALPRAZolam (XANAX) 0.25 MG tablet Take 0.25 mg by mouth 2 (two) times daily.      Marland Kitchen amLODipine (NORVASC) 5 MG tablet Take 5 mg by mouth daily.      Marland Kitchen ezetimibe (ZETIA) 10 MG tablet Take 1 tablet (10 mg total) by mouth every evening.  30 tablet  3  . levothyroxine (SYNTHROID, LEVOTHROID) 200 MCG tablet Take 200 mcg by mouth daily before breakfast.      . lisinopril (PRINIVIL,ZESTRIL) 20 MG tablet Take 20 mg by mouth 2 (two) times daily.      . metoprolol succinate (TOPROL-XL) 25 MG 24 hr tablet Take 25 mg by mouth every morning.      . Multiple Vitamin (MULTIVITAMIN) tablet Take 1 tablet by mouth daily.         Marland Kitchen omega-3 acid ethyl esters (LOVAZA) 1 G capsule Take 2 g by mouth 2 (two) times daily.      . propafenone (RYTHMOL SR) 325 MG 12 hr capsule Take 325 mg by mouth 2 (two) times daily.      . Rivaroxaban (XARELTO) 15 MG TABS tablet Take 1 tablet (15 mg total) by mouth 2 (two) times daily with a meal.  40 tablet  0  . [START ON 10/10/2012] Rivaroxaban (XARELTO) 20 MG TABS tablet Take 1 tablet (20 mg total) by mouth daily with supper.  30 tablet  0   No current facility-administered medications for this visit.    Allergies  Allergen Reactions  . Crestor [Rosuvastatin Calcium]   . Lipitor [Atorvastatin Calcium]     Patient Active Problem List   Diagnosis Date Noted  . PE (pulmonary embolism) 09/17/2012  . Phlebitis of leg, superficial 08/10/2012  . Chest pain 01/27/2012  . Hypothyroidism 12/17/2010  . HTN (hypertension) 10/01/2010  . PAF (paroxysmal atrial fibrillation) 10/01/2010  . High risk medication use 10/01/2010    History  Smoking status  . Never Smoker   Smokeless tobacco  . Never Used    History  Alcohol Use  . 4.8 oz/week  . 4 Cans of beer, 4 Shots of liquor per week    Comment: 09/17/2012 "1-2 beers and 1-2 tequila on Fri and Sat nights"  Family History  Problem Relation Age of Onset  . Hypertension Mother   . Hyperlipidemia Mother   . Heart attack Maternal Grandfather     Review of Systems: Constitutional: no fever chills diaphoresis or fatigue or change in weight.  Head and neck: no hearing loss, no epistaxis, no photophobia or visual disturbance. Respiratory: No cough, shortness of breath or wheezing. Cardiovascular: No chest pain peripheral edema, palpitations. Gastrointestinal: No abdominal distention, no abdominal pain, no change in bowel habits hematochezia or melena. Genitourinary: No dysuria, no frequency, no urgency, no nocturia. Musculoskeletal:No arthralgias, no back pain, no gait disturbance or myalgias. Neurological: No dizziness, no  headaches, no numbness, no seizures, no syncope, no weakness, no tremors. Hematologic: No lymphadenopathy, no easy bruising. Psychiatric: No confusion, no hallucinations, no sleep disturbance.    Physical Exam: Filed Vitals:   09/28/12 1141  BP: 122/90  Pulse: 59   the general appearance reveals a well-developed well-nourished gentleman in no distress.  His left arm is in a sling from his recent shoulder surgery.  His weight is down.The head and neck exam reveals pupils equal and reactive.  Extraocular movements are full.  There is no scleral icterus.  The mouth and pharynx are normal.  The neck is supple.  The carotids reveal no bruits.  The jugular venous pressure is normal.  The  thyroid is not enlarged.  There is no lymphadenopathy.  The chest is clear to percussion and auscultation.  There are no rales or rhonchi.  Expansion of the chest is symmetrical.  The precordium is quiet.  The first heart sound is normal.  The second heart sound is physiologically split.  There is no murmur gallop rub or click.  There is no abnormal lift or heave.  The abdomen is soft and nontender.  The bowel sounds are normal.  The liver and spleen are not enlarged.  There are no abdominal masses.  There are no abdominal bruits.  Extremities reveal good pedal pulses.  There is no phlebitis or edema.  There is no cyanosis or clubbing.  Strength is normal and symmetrical in all extremities.  There is no lateralizing weakness.  There are no sensory deficits.  The skin is warm and dry.  There is no rash.  EKG shows sinus bradycardia at 59 per minute and no ischemic changes.  No evidence of right heart strain.   Assessment / Plan: Continue present medication.  Consider switch off of ACE inhibitor if dry cough persists.  Recheck in 2 months for followup office visit.

## 2012-09-28 NOTE — Assessment & Plan Note (Signed)
The patient remains on Rythmol SR 325 mg every 12 hours and has not been experiencing any recent recurrent atrial fibrillation or palpitations.

## 2012-09-28 NOTE — Patient Instructions (Addendum)
Your physician recommends that you continue on your current medications as directed. Please refer to the Current Medication list given to you today.  Your physician recommends that you schedule a follow-up appointment in: 2 MONTHS

## 2012-09-28 NOTE — Assessment & Plan Note (Signed)
Blood pressure has remained stable while in the hospital recently.  No headaches or dizzy spells or syncope.

## 2012-09-28 NOTE — Assessment & Plan Note (Signed)
The patient states that he has been evaluated for hypercoagulability syndrome and that he has a abnormal lupus anticoagulant.  The patient is presently on Xarelto 15 mg twice a day and after November 24 he will be on Xarelto 20 mg once a day.  He is tolerating anticoagulation without any abnormal bleeding or excessive bruising.  His dyspnea is improving.  He still has a ticklish nonproductive cough which he states began at the time of his pulmonary embolus.

## 2012-11-06 ENCOUNTER — Other Ambulatory Visit: Payer: Self-pay | Admitting: *Deleted

## 2012-11-06 MED ORDER — RIVAROXABAN 20 MG PO TABS: 20.0000 mg | ORAL_TABLET | Freq: Every day | ORAL | Status: DC

## 2012-11-06 NOTE — Telephone Encounter (Signed)
Rx printed, called to [facility]

## 2012-11-21 ENCOUNTER — Other Ambulatory Visit: Payer: Self-pay

## 2012-11-21 MED ORDER — METOPROLOL SUCCINATE ER 25 MG PO TB24
25.0000 mg | ORAL_TABLET | Freq: Every morning | ORAL | Status: DC
Start: 2012-11-21 — End: 2013-06-24

## 2012-11-22 ENCOUNTER — Other Ambulatory Visit: Payer: Self-pay

## 2012-11-29 ENCOUNTER — Other Ambulatory Visit: Payer: Self-pay

## 2012-11-29 MED ORDER — LISINOPRIL 20 MG PO TABS: 20.0000 mg | ORAL_TABLET | Freq: Two times a day (BID) | ORAL | Status: DC

## 2012-11-30 ENCOUNTER — Encounter: Payer: Self-pay | Admitting: Cardiology

## 2012-11-30 ENCOUNTER — Ambulatory Visit (INDEPENDENT_AMBULATORY_CARE_PROVIDER_SITE_OTHER): Payer: BC Managed Care – PPO | Admitting: Cardiology

## 2012-11-30 VITALS — BP 122/78 | HR 71 | Ht 75.5 in | Wt 245.0 lb

## 2012-11-30 DIAGNOSIS — I4891 Unspecified atrial fibrillation: Secondary | ICD-10-CM

## 2012-11-30 DIAGNOSIS — I480 Paroxysmal atrial fibrillation: Secondary | ICD-10-CM

## 2012-11-30 DIAGNOSIS — I10 Essential (primary) hypertension: Secondary | ICD-10-CM

## 2012-11-30 DIAGNOSIS — I2699 Other pulmonary embolism without acute cor pulmonale: Secondary | ICD-10-CM

## 2012-11-30 NOTE — Assessment & Plan Note (Signed)
Blood pressure remained stable on current therapy.  No dizziness or syncope.

## 2012-11-30 NOTE — Assessment & Plan Note (Signed)
The patient has not had any recurrent pleuritic chest pain or shortness of breath

## 2012-11-30 NOTE — Assessment & Plan Note (Signed)
Patient has not had any recurrent atrial fibrillation.  He remains on Rythmol which he has tolerated well

## 2012-11-30 NOTE — Progress Notes (Signed)
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  He was recently admitted to [facility] with bilateral pulmonary emboli manifested by exertional dyspnea and cough and hemoptysis.  He has had a followup evaluation for hypercoagulability showing that he has an abnormal lupus anticoagulant.  He will need to stay on lifelong anticoagulation.  He has had past history of frequent DVT.  On this admission venous Dopplers did not localize any new areas of DVT He has a past history of paroxysmal atrial fibrillation. He also has a history of hypothyroidism and a history of hypercholesterolemia and history of essential hypertension.  Patient has a history of hypothyroidism and sees Dr. Evlyn Kanner. In January 2014 the patient experienced some chest tightness and underwent a treadmill Myoview stress test which showed no evidence of ischemia and his ejection fraction was 53% with no wall motion abnormalities. The chest tightness was occurring in the setting of a lot of emotional distress. The symptoms seem to have improved since the normal stress test.  He has not had any recurrent chest pain.  His blood pressure has improved and on his own he cut back his lisinopril to just 20 mg once a day.  He is having some residual malaise and apparently is being worked up for questionable Lyme disease by rheumatology.  Current Outpatient Prescriptions  Medication Sig Dispense Refill  . ALPRAZolam (XANAX) 0.25 MG tablet Take 0.25 mg by mouth 2 (two) times daily.      Marland Kitchen amLODipine (NORVASC) 5 MG tablet Take 5 mg by mouth daily.      Marland Kitchen ezetimibe (ZETIA) 10 MG tablet Take 1 tablet (10 mg total) by mouth every evening.  30 tablet  3  . levothyroxine (SYNTHROID, LEVOTHROID) 200 MCG tablet Take 200 mcg by mouth daily before  breakfast.      . lisinopril (PRINIVIL,ZESTRIL) 20 MG tablet Take 20 mg by mouth daily.      . metoprolol succinate (TOPROL-XL) 25 MG 24 hr tablet Take 1 tablet (25 mg total) by mouth every morning.  30 tablet  6  . Multiple Vitamin (MULTIVITAMIN) tablet Take 1 tablet by mouth daily.        Marland Kitchen omega-3 acid ethyl esters (LOVAZA) 1 G capsule Take 2 g by mouth 2 (two) times daily.      . propafenone (RYTHMOL SR) 325 MG 12 hr capsule Take 325 mg by mouth 2 (two) times daily.      . Rivaroxaban (XARELTO) 20 MG TABS tablet Take 1 tablet (20 mg total) by mouth daily with supper.  30 tablet  11   No current facility-administered medications for this visit.    Allergies  Allergen Reactions  . Crestor [Rosuvastatin Calcium]   . Lipitor [Atorvastatin Calcium]     Patient Active Problem List   Diagnosis Date Noted  . Cough 09/28/2012  . PE (pulmonary embolism) 09/17/2012  . Phlebitis of leg, superficial 08/10/2012  . Chest pain 01/27/2012  . Hypothyroidism 12/17/2010  . HTN (hypertension) 10/01/2010  . PAF (paroxysmal atrial fibrillation) 10/01/2010  . High risk medication use 10/01/2010    History  Smoking status  . Never Smoker   Smokeless tobacco  . Never Used    History  Alcohol Use  . 4.8 oz/week  .  4 Cans of beer, 4 Shots of liquor per week    Comment: 09/17/2012 "1-2 beers and 1-2 tequila on Fri and Sat nights"    Family History  Problem Relation Age of Onset  . Hypertension Mother   . Hyperlipidemia Mother   . Heart attack Maternal Grandfather     Review of Systems: Constitutional: no fever chills diaphoresis or fatigue or change in weight.  Head and neck: no hearing loss, no epistaxis, no photophobia or visual disturbance. Respiratory: No cough, shortness of breath or wheezing. Cardiovascular: No chest pain peripheral edema, palpitations. Gastrointestinal: No abdominal distention, no abdominal pain, no change in bowel habits hematochezia or melena. Genitourinary: No  dysuria, no frequency, no urgency, no nocturia. Musculoskeletal:No arthralgias, no back pain, no gait disturbance or myalgias. Neurological: No dizziness, no headaches, no numbness, no seizures, no syncope, no weakness, no tremors. Hematologic: No lymphadenopathy, no easy bruising. Psychiatric: No confusion, no hallucinations, no sleep disturbance.    Physical Exam: Filed Vitals:   11/30/12 1500  BP: 122/78  Pulse: 71   the general appearance reveals a well-developed well-nourished gentleman in no distress.  His left arm is in a sling from his recent shoulder surgery.  His weight is down.The head and neck exam reveals pupils equal and reactive.  Extraocular movements are full.  There is no scleral icterus.  The mouth and pharynx are normal.  The neck is supple.  The carotids reveal no bruits.  The jugular venous pressure is normal.  The  thyroid is not enlarged.  There is no lymphadenopathy.  The chest is clear to percussion and auscultation.  There are no rales or rhonchi.  Expansion of the chest is symmetrical.  The precordium is quiet.  The first heart sound is normal.  The second heart sound is physiologically split.  There is no murmur gallop rub or click.  There is no abnormal lift or heave.  The abdomen is soft and nontender.  The bowel sounds are normal.  The liver and spleen are not enlarged.  There are no abdominal masses.  There are no abdominal bruits.  Extremities reveal good pedal pulses.  There is no phlebitis or edema.  There is no cyanosis or clubbing.  Strength is normal and symmetrical in all extremities.  There is no lateralizing weakness.  There are no sensory deficits.  The skin is warm and dry.  There is no rash.  EKG shows normal sinus rhythm and nonspecific ST-T wave changes   Assessment / Plan: Continue present medication.  Continue careful low-cholesterol diet.  His weight is up 13 pounds since last visit but includes heavy boots today.  Recheck in 4 months for followup  office visit CBC TSH free T4 fasting lipid panel hepatic function panel and basal metabolic panel. He will remain on lifelong anticoagulation.  He is tolerating Xarelto.

## 2012-11-30 NOTE — Patient Instructions (Signed)
Your physician recommends that you continue on your current medications as directed. Please refer to the Current Medication list given to you today.  Your physician recommends that you schedule a follow-up appointment in: 4 MONTHS with Dr Patty Sermons  Your physician recommends that you return for a FASTING Lipid, Liver, BMP, CBC, TSH and Free T4 in 4 MONTHS--nothing to eat or drink after midnight, lab opens at 7:30 am

## 2012-12-04 ENCOUNTER — Other Ambulatory Visit: Payer: Self-pay

## 2012-12-04 MED ORDER — AMLODIPINE BESYLATE 5 MG PO TABS: 5.0000 mg | ORAL_TABLET | Freq: Every day | ORAL | Status: DC

## 2012-12-17 ENCOUNTER — Other Ambulatory Visit: Payer: Self-pay | Admitting: *Deleted

## 2012-12-17 DIAGNOSIS — F419 Anxiety disorder, unspecified: Secondary | ICD-10-CM

## 2012-12-17 MED ORDER — ALPRAZOLAM 0.25 MG PO TABS: 0.2500 mg | ORAL_TABLET | Freq: Two times a day (BID) | ORAL | Status: DC

## 2013-01-01 ENCOUNTER — Other Ambulatory Visit: Payer: Self-pay

## 2013-01-01 MED ORDER — PROPAFENONE HCL ER 325 MG PO CP12: 325.0000 mg | ORAL_CAPSULE | Freq: Two times a day (BID) | ORAL | Status: DC

## 2013-01-23 ENCOUNTER — Other Ambulatory Visit: Payer: Self-pay

## 2013-01-23 MED ORDER — EZETIMIBE 10 MG PO TABS: 10.0000 mg | ORAL_TABLET | Freq: Every evening | ORAL | Status: DC

## 2013-01-26 LAB — PSA: PSA: 1.8

## 2013-03-07 ENCOUNTER — Telehealth: Payer: Self-pay | Admitting: Cardiology

## 2013-03-07 NOTE — Telephone Encounter (Signed)
Received request from Nurse fax box, documents faxed for surgical clearance. °To: [facility] °Fax number: [phone] °Attention: °[phone]/kdm °

## 2013-03-08 ENCOUNTER — Telehealth: Payer: Self-pay | Admitting: Cardiology

## 2013-03-08 NOTE — Telephone Encounter (Signed)
New message  [facility] received [facility] clearance for surgery. But she needs clarification on when to stop or if to stop Xaratlo? You can call and five this information verbally @ [phone] or fax it over to [phone]. Please advise.

## 2013-03-12 NOTE — Telephone Encounter (Signed)
Spoke with patient and per  Dr. Patty SermonsBrackbill ok to hold Xarelto night before procedure. Advised patient

## 2013-03-12 NOTE — Telephone Encounter (Signed)
Left message to call back  

## 2013-03-29 ENCOUNTER — Other Ambulatory Visit: Payer: BC Managed Care – PPO

## 2013-03-29 ENCOUNTER — Ambulatory Visit: Payer: BC Managed Care – PPO | Admitting: Cardiology

## 2013-04-08 ENCOUNTER — Telehealth: Payer: Self-pay | Admitting: Cardiology

## 2013-04-08 ENCOUNTER — Other Ambulatory Visit: Payer: Self-pay | Admitting: *Deleted

## 2013-04-08 DIAGNOSIS — R51 Headache: Secondary | ICD-10-CM

## 2013-04-08 DIAGNOSIS — G609 Hereditary and idiopathic neuropathy, unspecified: Secondary | ICD-10-CM

## 2013-04-08 DIAGNOSIS — R413 Other amnesia: Secondary | ICD-10-CM

## 2013-04-08 DIAGNOSIS — G527 Disorders of multiple cranial nerves: Secondary | ICD-10-CM

## 2013-04-08 NOTE — Telephone Encounter (Signed)
New Prob    Pt has some questions regarding pt being put in Neurotin 300 mg. Please call.

## 2013-04-08 NOTE — Telephone Encounter (Signed)
Advised ok to take Neurontin per  Dr. Patty SermonsBrackbill

## 2013-04-11 ENCOUNTER — Other Ambulatory Visit: Payer: BC Managed Care – PPO

## 2013-04-12 ENCOUNTER — Ambulatory Visit
Admission: RE | Admit: 2013-04-12 | Discharge: 2013-04-12 | Disposition: A | Discharge status: Home / Self Care | Payer: BC Managed Care – PPO | Source: Ambulatory Visit | Attending: *Deleted | Admitting: *Deleted

## 2013-04-12 DIAGNOSIS — R413 Other amnesia: Secondary | ICD-10-CM

## 2013-04-12 DIAGNOSIS — G527 Disorders of multiple cranial nerves: Secondary | ICD-10-CM

## 2013-04-12 DIAGNOSIS — R51 Headache: Secondary | ICD-10-CM

## 2013-04-12 DIAGNOSIS — G609 Hereditary and idiopathic neuropathy, unspecified: Secondary | ICD-10-CM

## 2013-04-12 MED ORDER — GADOBENATE DIMEGLUMINE 529 MG/ML IV SOLN
20.0000 mL | Freq: Once | INTRAVENOUS | Status: AC | PRN
  Administered 2013-04-12: 20 mL via INTRAVENOUS

## 2013-05-02 ENCOUNTER — Other Ambulatory Visit: Payer: Self-pay

## 2013-05-02 MED ORDER — PROPAFENONE HCL ER 325 MG PO CP12: 325.0000 mg | ORAL_CAPSULE | Freq: Two times a day (BID) | ORAL | Status: DC

## 2013-05-10 ENCOUNTER — Encounter: Payer: Self-pay | Admitting: Cardiology

## 2013-05-10 ENCOUNTER — Other Ambulatory Visit: Payer: BC Managed Care – PPO

## 2013-05-10 ENCOUNTER — Ambulatory Visit (INDEPENDENT_AMBULATORY_CARE_PROVIDER_SITE_OTHER): Payer: BC Managed Care – PPO | Admitting: Cardiology

## 2013-05-10 VITALS — BP 122/85 | HR 63 | Ht 75.5 in | Wt 244.0 lb

## 2013-05-10 DIAGNOSIS — I480 Paroxysmal atrial fibrillation: Secondary | ICD-10-CM

## 2013-05-10 DIAGNOSIS — I10 Essential (primary) hypertension: Secondary | ICD-10-CM

## 2013-05-10 DIAGNOSIS — M791 Myalgia, unspecified site: Secondary | ICD-10-CM

## 2013-05-10 DIAGNOSIS — Z79899 Other long term (current) drug therapy: Secondary | ICD-10-CM

## 2013-05-10 DIAGNOSIS — E785 Hyperlipidemia, unspecified: Secondary | ICD-10-CM | POA: Insufficient documentation

## 2013-05-10 DIAGNOSIS — I119 Hypertensive heart disease without heart failure: Secondary | ICD-10-CM

## 2013-05-10 DIAGNOSIS — I4891 Unspecified atrial fibrillation: Secondary | ICD-10-CM

## 2013-05-10 DIAGNOSIS — I2699 Other pulmonary embolism without acute cor pulmonale: Secondary | ICD-10-CM

## 2013-05-10 DIAGNOSIS — IMO0001 Reserved for inherently not codable concepts without codable children: Secondary | ICD-10-CM

## 2013-05-10 NOTE — Assessment & Plan Note (Signed)
The patient is having myalgias in his low back with a lot of stiffness and difficulty bending over to pick up something off the floor or difficulty getting up from a chair.  While this could be related to his suspected Lyme disease, it could also be a manifestation of an adverse effect from the ezetimibe.  We will leave off the ezetimibe for a month and see if his myalgias and arthralgias improve.  Continue low cholesterol diet.  We are checking lab work today.  Recheck in 4 months for office visit and fasting lab work.

## 2013-05-10 NOTE — Assessment & Plan Note (Signed)
Blood pressure is remaining stable on current medication 

## 2013-05-10 NOTE — Progress Notes (Signed)
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  [facility] with bilateral pulmonary emboli manifested by exertional dyspnea and cough and hemoptysis.  He has had a followup evaluation for hypercoagulability showing that he has an abnormal lupus anticoagulant.  He will need to stay on lifelong anticoagulation.  He has had past history of frequent DVT.  On this admission venous Dopplers did not localize any new areas of DVT He has a past history of paroxysmal atrial fibrillation. He also has a history of hypothyroidism and a history of hypercholesterolemia and history of essential hypertension.  Patient has a history of hypothyroidism and [place]. [place] [place]. In January 2014 the patient experienced some chest tightness and underwent a treadmill Myoview stress test which showed no evidence of ischemia and his ejection fraction was 53% with no wall motion abnormalities. The chest tightness was occurring in the setting of a lot of emotional distress. The symptoms seem to have improved since the normal stress test.  He has not had any recurrent chest pain.  His blood pressure has improved and on his own he cut back his lisinopril to just 20 mg once a day.  He is being treated for suspected [place] disease [place] specialized [place].  He has been placed on a gluten-free diet.  He is on a complex antibiotic regimen which includes simultaneous Minocin, Septra, and Omnicef.  He has been having a lot of arthralgias and myalgias in his low back and in his upper thighs.  He is not on statins but is on ezetimibe.  Current Outpatient Prescriptions  Medication Sig Dispense Refill  . ALPRAZolam (XANAX) 0.25 MG tablet Take 1 tablet (0.25 mg total) by mouth 2 (two) times  daily.  60 tablet  5  . amLODipine (NORVASC) 5 MG tablet Take 1 tablet (5 mg total) by mouth daily.  30 tablet  6  . cefdinir (OMNICEF) 300 MG capsule Take 300 mg by mouth 2 (two) times daily.      Marland Kitchen. levothyroxine (SYNTHROID, LEVOTHROID) 200 MCG tablet Take 200 mcg by mouth daily before breakfast.      . metoprolol succinate (TOPROL-XL) 25 MG 24 hr tablet Take 1 tablet (25 mg total) by mouth every morning.  30 tablet  6  . minocycline (DYNACIN) 100 MG tablet Take 100 mg by mouth 2 (two) times daily.      . Multiple Vitamin (MULTIVITAMIN) tablet Take 1 tablet by mouth daily.        Marland Kitchen. omega-3 acid ethyl esters (LOVAZA) 1 G capsule Take 2 g by mouth 2 (two) times daily.      . propafenone (RYTHMOL SR) 325 MG 12 hr capsule Take 1 capsule (325 mg total) by mouth 2 (two) times daily.  60 capsule  6  . Rivaroxaban (XARELTO) 20 MG TABS tablet Take 1 tablet (20 mg total) by mouth daily with supper.  30 tablet  11  . Sulfamethoxazole-Trimethoprim (SEPTRA PO) Take by mouth.       No current facility-administered medications for this visit.    Allergies  Allergen Reactions  . Crestor [Rosuvastatin Calcium]   . Lipitor [Atorvastatin Calcium]     Patient Active Problem List   Diagnosis Date Noted  .  Dyslipidemia 05/10/2013  . Myalgia 05/10/2013  . Cough 09/28/2012  . PE (pulmonary embolism) 09/17/2012  . Phlebitis of leg, superficial 08/10/2012  . Chest pain 01/27/2012  . Hypothyroidism 12/17/2010  . HTN (hypertension) 10/01/2010  . PAF (paroxysmal atrial fibrillation) 10/01/2010  . High risk medication use 10/01/2010    History  Smoking status  . Never Smoker   Smokeless tobacco  . Never Used    History  Alcohol Use  . 4.8 oz/week  . 4 Cans of beer, 4 Shots of liquor per week    Comment: 09/17/2012 "1-2 beers and 1-2 tequila on Fri and Sat nights"    Family History  Problem Relation Age of Onset  . Hypertension Mother   . Hyperlipidemia Mother   . Heart attack Maternal  Grandfather     Review of Systems: Constitutional: no fever chills diaphoresis or fatigue or change in weight.  Head and neck: no hearing loss, no epistaxis, no photophobia or visual disturbance. Respiratory: No cough, shortness of breath or wheezing. Cardiovascular: No chest pain peripheral edema, palpitations. Gastrointestinal: No abdominal distention, no abdominal pain, no change in bowel habits hematochezia or melena. Genitourinary: No dysuria, no frequency, no urgency, no nocturia. Musculoskeletal:No arthralgias, no back pain, no gait disturbance or myalgias. Neurological: No dizziness, no headaches, no numbness, no seizures, no syncope, no weakness, no tremors. Hematologic: No lymphadenopathy, no easy bruising. Psychiatric: No confusion, no hallucinations, no sleep disturbance.    Physical Exam: Filed Vitals:   05/10/13 0936  BP: 122/85  Pulse: 63   the general appearance reveals a well-developed well-nourished gentleman in no distress.  His left arm is in a sling from his recent shoulder surgery.  His weight is down.The head and neck exam reveals pupils equal and reactive.  Extraocular movements are full.  There is no scleral icterus.  The mouth and pharynx are normal.  The neck is supple.  The carotids reveal no bruits.  The jugular venous pressure is normal.  The  thyroid is not enlarged.  There is no lymphadenopathy.  The chest is clear to percussion and auscultation.  There are no rales or rhonchi.  Expansion of the chest is symmetrical.  The precordium is quiet.  The first heart sound is normal.  The second heart sound is physiologically split.  There is no murmur gallop rub or click.  There is no abnormal lift or heave.  The abdomen is soft and nontender.  The bowel sounds are normal.  The liver and spleen are not enlarged.  There are no abdominal masses.  There are no abdominal bruits.  Extremities reveal good pedal pulses.  There is no phlebitis or edema.  There is no cyanosis or  clubbing.  Strength is normal and symmetrical in all extremities.  There is no lateralizing weakness.  There are no sensory deficits.  The skin is warm and dry.  There is no rash.  .   Assessment / Plan: The patient will continue same medication except hold ezetimibe for 4 weeks and see if myalgias improve. Recheck in 4 months for followup office visit EKG and fasting lipid panel hepatic function panel basal metabolic panel and CBC

## 2013-05-10 NOTE — Assessment & Plan Note (Signed)
The patient is on propafenone for paroxysmal atrial fibrillation.  He has not had any recurrent atrial fibrillation.  He is on long-term Xarelto because of his atrial fibrillation but also his hypercoagulable state and his previous pulmonary emboli and DVT history.

## 2013-05-10 NOTE — Patient Instructions (Addendum)
Will obtain labs today and call you with the results   HOLD ZETIA FOR 4 WEEKS AND CALL AND LET US KNOW HOW YOU ARE DOING   Your physician wants you to follow-up in: 4 months with fasting labs (lp/bmet/hfp/CBC) AND EKG You will receive a reminder letter in the mail two months in advance. If you don't receive a letter, please call our office to schedule the follow-up appointment.

## 2013-05-11 LAB — LIPID PANEL
Chol/HDL Ratio: 4.4 ratio units (ref 0.0–5.0)
Cholesterol, Total: 206 mg/dL — ABNORMAL HIGH (ref 100–199)
HDL: 47 mg/dL (ref >39)
LDL CALC: 147 mg/dL — AB (ref 0–99)
Triglycerides: 60 mg/dL (ref 0–149)
VLDL CHOLESTEROL CAL: 12 mg/dL (ref 5–40)

## 2013-05-11 LAB — T4, FREE: FREE T4: 1.72 ng/dL (ref 0.82–1.77)

## 2013-05-11 LAB — TSH: TSH: 4.22 u[IU]/mL (ref 0.450–4.500)

## 2013-05-12 NOTE — Progress Notes (Signed)
Quick Note:  Please report to patient. The recent labs are stable. Continue same medication and careful diet.  Thyroid okay. LDL still high.. Watch diet carefully. ______

## 2013-06-03 ENCOUNTER — Other Ambulatory Visit: Payer: Self-pay | Admitting: *Deleted

## 2013-06-03 MED ORDER — OMEGA-3-ACID ETHYL ESTERS 1 G PO CAPS: 2.0000 g | ORAL_CAPSULE | Freq: Two times a day (BID) | ORAL | Status: DC

## 2013-06-05 ENCOUNTER — Other Ambulatory Visit: Payer: Self-pay

## 2013-06-05 MED ORDER — AMLODIPINE BESYLATE 5 MG PO TABS: 5.0000 mg | ORAL_TABLET | Freq: Every day | ORAL | Status: DC

## 2013-06-13 ENCOUNTER — Other Ambulatory Visit: Payer: Self-pay | Admitting: *Deleted

## 2013-06-13 DIAGNOSIS — F419 Anxiety disorder, unspecified: Secondary | ICD-10-CM

## 2013-06-13 MED ORDER — ALPRAZOLAM 0.25 MG PO TABS: 0.2500 mg | ORAL_TABLET | Freq: Two times a day (BID) | ORAL | Status: DC

## 2013-06-24 ENCOUNTER — Other Ambulatory Visit: Payer: Self-pay | Admitting: Cardiology

## 2013-07-04 ENCOUNTER — Other Ambulatory Visit: Payer: Self-pay

## 2013-07-04 MED ORDER — OMEGA-3-ACID ETHYL ESTERS 1 G PO CAPS: 2.0000 g | ORAL_CAPSULE | Freq: Two times a day (BID) | ORAL | Status: DC

## 2013-08-02 ENCOUNTER — Telehealth: Payer: Self-pay | Admitting: *Deleted

## 2013-08-02 DIAGNOSIS — E7800 Pure hypercholesterolemia, unspecified: Secondary | ICD-10-CM

## 2013-08-02 NOTE — Telephone Encounter (Signed)
Left message to call back to see if resumed Zetia. Had held secondary to myalgias.

## 2013-08-02 NOTE — Telephone Encounter (Signed)
Ok to refill zetia for patient? Please advise. Thanks, MI

## 2013-08-05 MED ORDER — EZETIMIBE 10 MG PO TABS: 10.0000 mg | ORAL_TABLET | Freq: Every day | ORAL | Status: DC

## 2013-08-05 NOTE — Telephone Encounter (Signed)
Patient back on Zetia, Rx sent to pharmacy

## 2013-09-02 ENCOUNTER — Telehealth: Payer: Self-pay | Admitting: Cardiology

## 2013-09-02 NOTE — Telephone Encounter (Signed)
Will forward to  Dr. Brackbill  

## 2013-09-02 NOTE — Telephone Encounter (Signed)
What is this drug artemisinin? I cannot find it in [employer] or on Google.  Is it an herbal remedy of some kind?  Maybe check with Lucio EdwardSallie or Riki RuskJeremy regarding possible interaction with Xarelto?

## 2013-09-02 NOTE — Telephone Encounter (Addendum)
New message     Pt is on xeralto.  He has lymes disease.  Can he take artemisinin 200mg ----1000mg  bid?

## 2013-09-03 NOTE — Telephone Encounter (Signed)
Please report to patient.  We checked into the herbal medicine that he had inquired about( artemisinnin.) It will interfere with the function of Xarelto.  He should not use the herbal medicine for his Lyme disease because it well clear the Xarelto out of his body at an accelerated rate.

## 2013-09-03 NOTE — Telephone Encounter (Signed)
Dr. Patty SermonsBrackbill--  Regarding patient Isaiah [name], artemisinin is a Cong[place]inese herbal supplement that can be used in the treatment of malaria and Lyme disease. It is a strong inducer of CYP enzymes and Xarelto is a substrate. He should not be started on this herbal as it will clear the Xarelto from his body at an accelerated rate and he will not be properly anticoagulated.  Thank you,  Marveen Donlon, Pharm.D Clinical Pharmacy Resident

## 2013-09-04 NOTE — Telephone Encounter (Signed)
LMTCB

## 2013-09-04 NOTE — Telephone Encounter (Signed)
Informed the pt that per Dr Patty SermonsBrackbill he should not take the herbal medicine artemisinnin, for it will interfere with the function of Xarelto.  Informed the pt that Dr Patty SermonsBrackbill states that the herbal med will clear the xarelto out of his body at an accelerated rate.  Pt verbalized understanding and agrees with this plan.

## 2013-09-17 ENCOUNTER — Other Ambulatory Visit: Payer: Self-pay | Admitting: *Deleted

## 2013-09-17 MED ORDER — AMLODIPINE BESYLATE 5 MG PO TABS: 5.0000 mg | ORAL_TABLET | Freq: Every day | ORAL | Status: DC

## 2013-09-17 MED ORDER — METOPROLOL SUCCINATE ER 25 MG PO TB24: ORAL_TABLET | ORAL | Status: DC

## 2013-09-17 MED ORDER — RIVAROXABAN 20 MG PO TABS: 20.0000 mg | ORAL_TABLET | Freq: Every day | ORAL | Status: DC

## 2013-09-17 MED ORDER — PROPAFENONE HCL ER 325 MG PO CP12: 325.0000 mg | ORAL_CAPSULE | Freq: Two times a day (BID) | ORAL | Status: DC

## 2013-10-11 ENCOUNTER — Telehealth: Payer: Self-pay

## 2013-10-11 DIAGNOSIS — F419 Anxiety disorder, unspecified: Secondary | ICD-10-CM

## 2013-10-11 MED ORDER — ALPRAZOLAM 0.25 MG PO TABS: 0.2500 mg | ORAL_TABLET | Freq: Two times a day (BID) | ORAL | Status: DC

## 2013-10-11 NOTE — Telephone Encounter (Signed)
Called pharmacy [facility] to refill Xanax 0.25 x 1

## 2013-11-11 ENCOUNTER — Other Ambulatory Visit: Payer: Self-pay

## 2013-11-11 DIAGNOSIS — F419 Anxiety disorder, unspecified: Secondary | ICD-10-CM

## 2013-11-11 NOTE — Telephone Encounter (Signed)
Okay to refill Xanax for 90 day supply

## 2013-11-11 NOTE — Telephone Encounter (Signed)
Refilled, patient aware

## 2013-11-12 MED ORDER — ALPRAZOLAM 0.25 MG PO TABS: 0.2500 mg | ORAL_TABLET | Freq: Two times a day (BID) | ORAL | Status: DC | PRN

## 2013-11-29 ENCOUNTER — Encounter: Payer: Self-pay | Admitting: Cardiology

## 2013-11-29 ENCOUNTER — Ambulatory Visit (INDEPENDENT_AMBULATORY_CARE_PROVIDER_SITE_OTHER): Payer: BC Managed Care – PPO | Admitting: Cardiology

## 2013-11-29 VITALS — BP 140/88 | HR 66 | Ht 75.5 in | Wt 259.0 lb

## 2013-11-29 DIAGNOSIS — I10 Essential (primary) hypertension: Secondary | ICD-10-CM

## 2013-11-29 DIAGNOSIS — I119 Hypertensive heart disease without heart failure: Secondary | ICD-10-CM

## 2013-11-29 DIAGNOSIS — E785 Hyperlipidemia, unspecified: Secondary | ICD-10-CM

## 2013-11-29 DIAGNOSIS — M791 Myalgia, unspecified site: Secondary | ICD-10-CM

## 2013-11-29 DIAGNOSIS — R600 Localized edema: Secondary | ICD-10-CM | POA: Insufficient documentation

## 2013-11-29 DIAGNOSIS — F419 Anxiety disorder, unspecified: Secondary | ICD-10-CM

## 2013-11-29 DIAGNOSIS — R609 Edema, unspecified: Secondary | ICD-10-CM | POA: Insufficient documentation

## 2013-11-29 DIAGNOSIS — E7800 Pure hypercholesterolemia, unspecified: Secondary | ICD-10-CM

## 2013-11-29 DIAGNOSIS — I480 Paroxysmal atrial fibrillation: Secondary | ICD-10-CM

## 2013-11-29 DIAGNOSIS — I2699 Other pulmonary embolism without acute cor pulmonale: Secondary | ICD-10-CM

## 2013-11-29 LAB — LIPID PANEL
CHOLESTEROL: 195 mg/dL (ref 0–200)
HDL: 35 mg/dL — ABNORMAL LOW (ref >39.00)
LDL Cholesterol: 147 mg/dL — ABNORMAL HIGH (ref 0–99)
NonHDL: 160
TRIGLYCERIDES: 67 mg/dL (ref 0.0–149.0)
Total CHOL/HDL Ratio: 6
VLDL: 13.4 mg/dL (ref 0.0–40.0)

## 2013-11-29 LAB — CBC WITH DIFFERENTIAL/PLATELET
BASOS PCT: 0.7 % (ref 0.0–3.0)
Basophils Absolute: 0 10*3/uL (ref 0.0–0.1)
Eosinophils Absolute: 0.1 10*3/uL (ref 0.0–0.7)
Eosinophils Relative: 2.4 % (ref 0.0–5.0)
HCT: 50.8 % (ref 39.0–52.0)
HEMOGLOBIN: 16.6 g/dL (ref 13.0–17.0)
Lymphocytes Relative: 42.3 % (ref 12.0–46.0)
Lymphs Abs: 1.9 10*3/uL (ref 0.7–4.0)
MCHC: 32.7 g/dL (ref 30.0–36.0)
MCV: 91.7 fl (ref 78.0–100.0)
MONO ABS: 0.5 10*3/uL (ref 0.1–1.0)
Monocytes Relative: 10.3 % (ref 3.0–12.0)
NEUTROS ABS: 2 10*3/uL (ref 1.4–7.7)
Neutrophils Relative %: 44.3 % (ref 43.0–77.0)
Platelets: 222 10*3/uL (ref 150.0–400.0)
RBC: 5.54 Mil/uL (ref 4.22–5.81)
RDW: 13.2 % (ref 11.5–15.5)
WBC: 4.5 10*3/uL (ref 4.0–10.5)

## 2013-11-29 LAB — HEPATIC FUNCTION PANEL
ALK PHOS: 73 U/L (ref 39–117)
ALT: 30 U/L (ref 0–53)
AST: 38 U/L — ABNORMAL HIGH (ref 0–37)
Albumin: 3.6 g/dL (ref 3.5–5.2)
BILIRUBIN DIRECT: 0 mg/dL (ref 0.0–0.3)
BILIRUBIN TOTAL: 0.5 mg/dL (ref 0.2–1.2)
Total Protein: 7.6 g/dL (ref 6.0–8.3)

## 2013-11-29 LAB — BASIC METABOLIC PANEL
BUN: 16 mg/dL (ref 6–23)
CHLORIDE: 103 meq/L (ref 96–112)
CO2: 28 meq/L (ref 19–32)
Calcium: 9.5 mg/dL (ref 8.4–10.5)
Creatinine, Ser: 1.4 mg/dL (ref 0.4–1.5)
GFR: 55.26 mL/min — ABNORMAL LOW (ref >60.00)
Glucose, Bld: 97 mg/dL (ref 70–99)
Potassium: 4.5 mEq/L (ref 3.5–5.1)
SODIUM: 138 meq/L (ref 135–145)

## 2013-11-29 LAB — TSH: TSH: 3.7 u[IU]/mL (ref 0.35–4.50)

## 2013-11-29 LAB — T4, FREE: Free T4: 1.28 ng/dL (ref 0.60–1.60)

## 2013-11-29 MED ORDER — PROPAFENONE HCL ER 325 MG PO CP12: 325.0000 mg | ORAL_CAPSULE | Freq: Two times a day (BID) | ORAL | Status: DC

## 2013-11-29 MED ORDER — FUROSEMIDE 20 MG PO TABS: 20.0000 mg | ORAL_TABLET | Freq: Every day | ORAL | Status: DC | PRN

## 2013-11-29 MED ORDER — RIVAROXABAN 20 MG PO TABS: 20.0000 mg | ORAL_TABLET | Freq: Every day | ORAL | Status: DC

## 2013-11-29 MED ORDER — METOPROLOL SUCCINATE ER 25 MG PO TB24: ORAL_TABLET | ORAL | Status: DC

## 2013-11-29 MED ORDER — AMLODIPINE BESYLATE 5 MG PO TABS: 5.0000 mg | ORAL_TABLET | Freq: Every day | ORAL | Status: DC

## 2013-11-29 MED ORDER — OMEGA-3-ACID ETHYL ESTERS 1 G PO CAPS: 2.0000 g | ORAL_CAPSULE | Freq: Two times a day (BID) | ORAL | Status: DC

## 2013-11-29 MED ORDER — EZETIMIBE 10 MG PO TABS: 10.0000 mg | ORAL_TABLET | Freq: Every day | ORAL | Status: DC

## 2013-11-29 NOTE — Assessment & Plan Note (Signed)
He has had no recurrent atrial fibrillation.

## 2013-11-29 NOTE — Assessment & Plan Note (Signed)
The patient has a history of dyslipidemia.  He is statin intolerant.  He is able to take ezetimibe.

## 2013-11-29 NOTE — Patient Instructions (Signed)
Will obtain labs today and call you with the results (LP/BMET/HFP/CBC/TSH/T4F)  START LASIX (FUROSEMIDE) 20 MG DAILY AS NEEDED FOR FLUID  Your physician recommends that you schedule a follow-up appointment in: 4 months with fasting labs (lp/bmet/hfp/CBC)

## 2013-11-29 NOTE — Assessment & Plan Note (Signed)
The patient is not having any symptoms from his essential hypertension.  He has gained 15 pounds since last visit.  He has had occasional mild peripheral edema.  He is not having any symptoms of CHF or orthopnea or paroxysmal nocturnal dyspnea.

## 2013-11-29 NOTE — Progress Notes (Signed)
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  He was [facility] with bilateral pulmonary emboli manifested by exertional dyspnea and cough and hemoptysis.  He has had a followup evaluation for hypercoagulability showing that he has an abnormal lupus anticoagulant.  He will need to stay on lifelong anticoagulation.  He has had past history of frequent DVT.  On this admission venous Dopplers did not localize any new areas of DVT He has a past history of paroxysmal atrial fibrillation. He also has a history of hypothyroidism and a history of hypercholesterolemia and history of essential hypertension.  Patient has a history of hypothyroidism and sees Dr. [facility]lyn [facility]. In January 2014 the patient experienced some chest tightness and underwent a treadmill Myoview stress test which showed no evidence of ischemia and his ejection fraction was 53% with no wall motion abnormalities. The chest tightness was occurring in the setting of a lot of emotional distress. The symptoms seem to have improved since the normal stress test.  He has not had any recurrent chest pain.  His blood pressure has improved and on his own he cut back his lisinopril to just 20 mg once a day.  He is being treated for suspected Lyme disease by a specialized clinic in [place].  He has been placed on a gluten-free diet.  He is on a complex antibiotic regimen which includes simultaneous Minocin, Septra, and Omnicef.  He has been having a lot of arthralgias and myalgias in his low back and in his upper thighs.  He is not on statins but is on ezetimibe. H right off ezetimibe did not result in any improvement of his myalgia symptoms. Current Outpatient Prescriptions  Medication Sig Dispense Refill  . ALPRAZolam  (XANAX) 0.25 MG tablet Take 1 tablet (0.25 mg total) by mouth 2 (two) times daily as needed for anxiety. 180 tablet 1  . amLODipine (NORVASC) 5 MG tablet Take 1 tablet (5 mg total) by mouth daily. 90 tablet 3  . atovaquone (MEPRON) 750 MG/5ML suspension as directed.   0  . cefdinir (OMNICEF) 300 MG capsule Take 300 mg by mouth 2 (two) times daily.    Marland Kitchen. ezetimibe (ZETIA) 10 MG tablet Take 1 tablet (10 mg total) by mouth daily. 90 tablet 3  . fluconazole (DIFLUCAN) 200 MG tablet Take 200 mg by mouth as directed.   0  . levothyroxine (SYNTHROID, LEVOTHROID) 200 MCG tablet Take 200 mcg by mouth daily before breakfast.    . metoprolol succinate (TOPROL-XL) 25 MG 24 hr tablet TAKE ONE TABLET BY MOUTH EVERY MORNING 90 tablet 3  . metroNIDAZOLE (FLAGYL) 500 MG tablet Take 500 mg by mouth as directed.   0  . minocycline (DYNACIN) 100 MG tablet Take 100 mg by mouth 2 (two) times daily.    . Multiple Vitamin (MULTIVITAMIN) tablet Take 1 tablet by mouth daily.      Marland Kitchen. omega-3 acid ethyl esters (LOVAZA) 1 G capsule Take 2 capsules (2 g total) by mouth 2 (two) times daily. 360 capsule 3  . propafenone (RYTHMOL SR) 325 MG 12 hr capsule Take 1 capsule (325 mg total) by mouth 2 (two) times daily. 180 capsule 3  . rivaroxaban (XARELTO) 20 MG TABS tablet Take 1 tablet (  20 mg total) by mouth daily with supper. 90 tablet 3  . Sulfamethoxazole-Trimethoprim (SEPTRA PO) Take by mouth.    . furosemide (LASIX) 20 MG tablet Take 1 tablet (20 mg total) by mouth daily as needed for fluid. 90 tablet 3   No current facility-administered medications for this visit.    Allergies  Allergen Reactions  . Crestor [Rosuvastatin Calcium]   . Lipitor [Atorvastatin Calcium]     Patient Active Problem List   Diagnosis Date Noted  . Peripheral edema 11/29/2013  . Dyslipidemia 05/10/2013  . Myalgia 05/10/2013  . Cough 09/28/2012  . PE (pulmonary embolism) 09/17/2012  . Phlebitis of leg, superficial 08/10/2012  . Chest pain  01/27/2012  . Hypothyroidism 12/17/2010  . HTN (hypertension) 10/01/2010  . PAF (paroxysmal atrial fibrillation) 10/01/2010  . High risk medication use 10/01/2010    History  Smoking status  . Never Smoker   Smokeless tobacco  . Never Used    History  Alcohol Use  . 4.8 oz/week  . 4 Cans of beer, 4 Shots of liquor per week    Comment: 09/17/2012 "1-2 beers and 1-2 tequila on Fri and Sat nights"    Family History  Problem Relation Age of Onset  . Hypertension Mother   . Hyperlipidemia Mother   . Heart attack Maternal Grandfather     Review of Systems: Constitutional: no fever chills diaphoresis or fatigue or change in weight.  Head and neck: no hearing loss, no epistaxis, no photophobia or visual disturbance. Respiratory: No cough, shortness of breath or wheezing. Cardiovascular: No chest pain peripheral edema, palpitations. Gastrointestinal: No abdominal distention, no abdominal pain, no change in bowel habits hematochezia or melena. Genitourinary: No dysuria, no frequency, no urgency, no nocturia. Musculoskeletal:No arthralgias, no back pain, no gait disturbance or myalgias. Neurological: No dizziness, no headaches, no numbness, no seizures, no syncope, no weakness, no tremors. Hematologic: No lymphadenopathy, no easy bruising. Psychiatric: No confusion, no hallucinations, no sleep disturbance.    Physical Exam: Filed Vitals:   11/29/13 0854  BP: 140/88  Pulse: 66   the general appearance reveals a well-developed well-nourished gentleman in no distress.  His left arm is in a sling from his recent shoulder surgery.  His weight is down.The head and neck exam reveals pupils equal and reactive.  Extraocular movements are full.  There is no scleral icterus.  The mouth and pharynx are normal.  The neck is supple.  The carotids reveal no bruits.  The jugular venous pressure is normal.  The  thyroid is not enlarged.  There is no lymphadenopathy.  The chest is clear to percussion  and auscultation.  There are no rales or rhonchi.  Expansion of the chest is symmetrical.  The precordium is quiet.  The first heart sound is normal.  The second heart sound is physiologically split.  There is no murmur gallop rub or click.  There is no abnormal lift or heave.  The abdomen is soft and nontender.  The bowel sounds are normal.  The liver and spleen are not enlarged.  There are no abdominal masses.  There are no abdominal bruits.  Extremities reveal good pedal pulses.  There is no phlebitis or edema.  There is no cyanosis or clubbing.  Strength is normal and symmetrical in all extremities.  There is no lateralizing weakness.  There are no sensory deficits.  The skin is warm and dry.  There is no rash.  EKG today shows normal sinus rhythm and is within normal  limits   Assessment / Plan: The patient will continue same medication.we are adding furosemide 20 mg to have on hand to be used one daily when necessary for excessive peripheral edema. Recheck in 4 months for followup office visit and fasting lipid panel hepatic function panel basal metabolic panel and CBC. Try to cut back on calories and lose weight.

## 2013-11-29 NOTE — Assessment & Plan Note (Signed)
He is still under the care of the specialized Lyme disease clinic in [place].  He is considering weekly infusions of vitamin C.

## 2013-11-30 NOTE — Progress Notes (Signed)
Quick Note:  Please report to patient. The recent labs are stable. Continue same medication and careful diet.Thyroid okay. LDL is still high. Work on diet and weight loss. Copy of labs to Dr. Evlyn [facility] ______

## 2014-01-27 ENCOUNTER — Other Ambulatory Visit: Payer: Self-pay | Admitting: Cardiology

## 2014-03-18 ENCOUNTER — Emergency Department (HOSPITAL_COMMUNITY): Payer: BLUE CROSS/BLUE SHIELD

## 2014-03-18 ENCOUNTER — Encounter (HOSPITAL_COMMUNITY): Payer: Self-pay | Admitting: Emergency Medicine

## 2014-03-18 ENCOUNTER — Emergency Department (HOSPITAL_COMMUNITY)
Admission: EM | Admit: 2014-03-18 | Discharge: 2014-03-18 | Disposition: A | Discharge status: Home / Self Care | Payer: BLUE CROSS/BLUE SHIELD | Attending: Emergency Medicine | Admitting: Emergency Medicine

## 2014-03-18 DIAGNOSIS — Z86711 Personal history of pulmonary embolism: Secondary | ICD-10-CM | POA: Insufficient documentation

## 2014-03-18 DIAGNOSIS — E039 Hypothyroidism, unspecified: Secondary | ICD-10-CM | POA: Diagnosis not present

## 2014-03-18 DIAGNOSIS — Z79899 Other long term (current) drug therapy: Secondary | ICD-10-CM | POA: Diagnosis not present

## 2014-03-18 DIAGNOSIS — R079 Chest pain, unspecified: Secondary | ICD-10-CM | POA: Diagnosis present

## 2014-03-18 DIAGNOSIS — I10 Essential (primary) hypertension: Secondary | ICD-10-CM | POA: Insufficient documentation

## 2014-03-18 DIAGNOSIS — Z86718 Personal history of other venous thrombosis and embolism: Secondary | ICD-10-CM | POA: Insufficient documentation

## 2014-03-18 LAB — CBC WITH DIFFERENTIAL/PLATELET
Basophils Absolute: 0 10*3/uL (ref 0.0–0.1)
Basophils Relative: 0 % (ref 0–1)
EOS ABS: 0.1 10*3/uL (ref 0.0–0.7)
Eosinophils Relative: 2 % (ref 0–5)
HCT: 51.7 % (ref 39.0–52.0)
Hemoglobin: 17.4 g/dL — ABNORMAL HIGH (ref 13.0–17.0)
LYMPHS ABS: 2.3 10*3/uL (ref 0.7–4.0)
Lymphocytes Relative: 44 % (ref 12–46)
MCH: 29.6 pg (ref 26.0–34.0)
MCHC: 33.7 g/dL (ref 30.0–36.0)
MCV: 87.9 fL (ref 78.0–100.0)
MONOS PCT: 6 % (ref 3–12)
Monocytes Absolute: 0.3 10*3/uL (ref 0.1–1.0)
NEUTROS ABS: 2.4 10*3/uL (ref 1.7–7.7)
NEUTROS PCT: 48 % (ref 43–77)
Platelets: 214 10*3/uL (ref 150–400)
RBC: 5.88 MIL/uL — AB (ref 4.22–5.81)
RDW: 15.8 % — ABNORMAL HIGH (ref 11.5–15.5)
WBC: 5.1 10*3/uL (ref 4.0–10.5)

## 2014-03-18 LAB — D-DIMER, QUANTITATIVE (NOT AT ARMC)

## 2014-03-18 LAB — I-STAT TROPONIN, ED
Troponin i, poc: 0.01 ng/mL (ref 0.00–0.08)
Troponin i, poc: 0.01 ng/mL (ref 0.00–0.08)

## 2014-03-18 LAB — COMPREHENSIVE METABOLIC PANEL
ALK PHOS: 86 U/L (ref 39–117)
ALT: 36 U/L (ref 0–53)
ANION GAP: 8 (ref 5–15)
AST: 39 U/L — ABNORMAL HIGH (ref 0–37)
Albumin: 4.2 g/dL (ref 3.5–5.2)
BILIRUBIN TOTAL: 1 mg/dL (ref 0.3–1.2)
BUN: 17 mg/dL (ref 6–23)
CHLORIDE: 101 mmol/L (ref 96–112)
CO2: 29 mmol/L (ref 19–32)
CREATININE: 1.47 mg/dL — AB (ref 0.50–1.35)
Calcium: 9.6 mg/dL (ref 8.4–10.5)
GFR calc non Af Amer: 53 mL/min — ABNORMAL LOW (ref >90)
GFR, EST AFRICAN AMERICAN: 62 mL/min — AB (ref >90)
Glucose, Bld: 96 mg/dL (ref 70–99)
Potassium: 4.6 mmol/L (ref 3.5–5.1)
Sodium: 138 mmol/L (ref 135–145)
Total Protein: 7.8 g/dL (ref 6.0–8.3)

## 2014-03-18 MED ORDER — OMEPRAZOLE 20 MG PO CPDR: 20.0000 mg | DELAYED_RELEASE_CAPSULE | Freq: Every day | ORAL | Status: AC

## 2014-03-18 NOTE — ED Notes (Signed)
Patient with chest pain and shortness of breath since about 1715 today.  Patient states that it is a tightness in the left and moved to center of his chest.  No radiation anywhere else.  Patient does have history of blood clots and is on Xarelto.

## 2014-03-18 NOTE — ED Provider Notes (Signed)
CSN: [id number]     Arrival date & time 03/18/14  1949 History   First MD Initiated Contact with Patient 03/18/14 2007     Chief Complaint  Patient presents with  . Chest Pain  . Shortness of Breath      HPI Patient reports she has had some right thigh pain through the day and then this evening felt as though he was having some anterior chest pain.  He states it felt worse when he would take a deep breath.  He denied radiation of this discomfort or pain.  He denies pain in his arms, jaw, neck.  He has a history of pulmonary embolism in the fall 2015.  He takes Xarelto.  He has been compliant with this.  He does report that he recently returned from [place].  No history of cardiac disease.  He states he's had 2 normal nuclear stress test.  He's ever had a heart catheterization.  He does have a cardiologist.  No worsening of his symptoms when he lays flat.  He doesn't report any swelling in his legs but does report some pain to his right medial thigh earlier today.  He has a history of atrial fibrillation but retains normal sinus rhythm.  He denies palpitations.   Past Medical History  Diagnosis Date  . Hypertension   . Hyperlipidemia   . Situational stress   . Diastolic dysfunction     Grade I per echo in 2/12  . PAF (paroxysmal atrial fibrillation)     no recurrence since Feb 2012  . PONV (postoperative nausea and vomiting)   . DVT (deep venous thrombosis) 2000's    "both legs in the calves; left went all the way up to my groin" (09/17/2012)  . Pulmonary embolism 09/17/2012    "just today; 3 on the right; 2 on the left" (09/17/2012)  . Exertional shortness of breath     "just yesterday" (09/17/2012)  . Hypothyroidism     "wiped out from taking amiodarone" (09/17/2012)   Past Surgical History  Procedure Laterality Date  . Cardiovascular stress test  07/17/2009    EF 59%  . Transthoracic echocardiogram  03/05/2010    EF 60-65%  . Shoulder surgery      left  . Inguinal hernia repair Right  1992  . Inguinal hernia repair Left 2013  . Distal biceps tendon repair Right 2011  . Shoulder arthroscopy w/ rotator cuff repair Left 09/04/2012  . Cardioversion     Family History  Problem Relation Age of Onset  . Hypertension Mother   . Hyperlipidemia Mother   . Heart attack Maternal Grandfather    History  Substance Use Topics  . Smoking status: Never Smoker   . Smokeless tobacco: Never Used  . Alcohol Use: 4.8 oz/week    4 Cans of beer, 4 Shots of liquor per week     Comment: 09/17/2012 "1-2 beers and 1-2 tequila on Fri and Sat nights"    Review of Systems  All other systems reviewed and are negative.     Allergies  Crestor and Lipitor  Home Medications   Prior to Admission medications   Medication Sig Start Date End Date Taking? Authorizing Provider  ALPRAZolam (XANAX) 0.25 MG tablet Take 1 tablet (0.25 mg total) by mouth 2 (two) times daily as needed for anxiety. 11/12/13   Cassell Clement, MD  amLODipine (NORVASC) 5 MG tablet Take 1 tablet (5 mg total) by mouth daily. 11/29/13   Cassell Clement, MD  atovaquone (MEPRON) 750 MG/5ML suspension as directed.  10/30/13   Historical Provider, MD  cefdinir (OMNICEF) 300 MG capsule Take 300 mg by mouth 2 (two) times daily.    Historical Provider, MD  ezetimibe (ZETIA) 10 MG tablet Take 1 tablet (10 mg total) by mouth daily. 11/29/13   Cassell Clement, MD  fluconazole (DIFLUCAN) 200 MG tablet Take 200 mg by mouth as directed.  10/30/13   Historical Provider, MD  furosemide (LASIX) 20 MG tablet Take 1 tablet (20 mg total) by mouth daily as needed for fluid. 11/29/13   Cassell Clement, MD  levothyroxine (SYNTHROID, LEVOTHROID) 200 MCG tablet Take 200 mcg by mouth daily before breakfast.    Historical Provider, MD  metoprolol succinate (TOPROL-XL) 25 MG 24 hr tablet TAKE ONE TABLET BY MOUTH EVERY MORNING 11/29/13   Cassell Clement, MD  metroNIDAZOLE (FLAGYL) 500 MG tablet Take 500 mg by mouth as directed.  10/30/13    Historical Provider, MD  minocycline (DYNACIN) 100 MG tablet Take 100 mg by mouth 2 (two) times daily.    Historical Provider, MD  Multiple Vitamin (MULTIVITAMIN) tablet Take 1 tablet by mouth daily.      Historical Provider, MD  omega-3 acid ethyl esters (LOVAZA) 1 G capsule Take 2 capsules (2 g total) by mouth 2 (two) times daily. 11/29/13   Cassell Clement, MD  propafenone (RYTHMOL SR) 325 MG 12 hr capsule Take 1 capsule (325 mg total) by mouth 2 (two) times daily. 11/29/13   Cassell Clement, MD  rivaroxaban (XARELTO) 20 MG TABS tablet Take 1 tablet (20 mg total) by mouth daily with supper. 11/29/13   Cassell Clement, MD  Sulfamethoxazole-Trimethoprim (SEPTRA PO) Take by mouth.    Historical Provider, MD   BP 152/84 mmHg  Pulse 64  Temp(Src) 97.9 F (36.6 C) (Oral)  Resp 10  Ht  (1.905 m)  Wt 250 lb (113.399 kg)  BMI 31.25 kg/m2  SpO2 98% Physical Exam  Constitutional: He is oriented to person, place, and time. He appears well-developed and well-nourished.  HENT:  Head: Normocephalic and atraumatic.  Eyes: EOM are normal.  Neck: Normal range of motion.  Cardiovascular: Normal rate, regular rhythm, normal heart sounds and intact distal pulses.   Pulmonary/Chest: Effort normal and breath sounds normal. No respiratory distress.  Abdominal: Soft. He exhibits no distension. There is no tenderness.  Musculoskeletal: Normal range of motion.  Neurological: He is alert and oriented to person, place, and time.  Skin: Skin is warm and dry.  Psychiatric: He has a normal mood and affect. Judgment normal.  Nursing note and vitals reviewed.   ED Course  Procedures (including critical care time) Labs Review Labs Reviewed  CBC WITH DIFFERENTIAL/PLATELET - Abnormal; Notable for the following:    RBC 5.88 (*)    Hemoglobin 17.4 (*)    RDW 15.8 (*)    All other components within normal limits  COMPREHENSIVE METABOLIC PANEL - Abnormal; Notable for the following:    Creatinine, Ser  1.47 (*)    AST 39 (*)    GFR calc non Af Amer 53 (*)    GFR calc Af Amer 62 (*)    All other components within normal limits  D-DIMER, QUANTITATIVE  I-STAT TROPOININ, ED  I-STAT TROPOININ, ED    Imaging Review Dg Chest 2 View  03/18/2014   CLINICAL DATA:  Initial evaluation for acute left-sided chest pain with tightness and shortness of breath.  EXAM: CHEST  2 VIEW  COMPARISON:  Prior study  from 09/17/2012  FINDINGS: The cardiac and mediastinal silhouettes are stable in size and contour, and remain within normal limits.  The lungs are normally inflated. No airspace consolidation, pleural effusion, or pulmonary edema is identified. There is no pneumothorax.  No acute osseous abnormality identified.  IMPRESSION: No active cardiopulmonary disease.   Electronically Signed   By: Rise MuBenjamin  McClintock M.D.   On: 03/18/2014 21:55     EKG Interpretation   Date/Time:  Tuesday March 18 2014 19:58:02 EST Ventricular Rate:  84 PR Interval:  162 QRS Duration: 90 QT Interval:  334 QTC Calculation: 394 R Axis:   26 Text Interpretation:  Normal sinus rhythm Normal ECG No significant change  was found Confirmed by Karsten Vaughn  MD, Hasset Chaviano ([id number]) on 03/18/2014 8:08:10 PM      MDM   Final diagnoses:  Chest pain, unspecified chest pain type    EKG is normal sinus rhythm.  Troponin is negative 2.  D-dimer is normal.  I doubt that he has a new pulmonary embolism.  He is compliant with his medications.  I've asked that he return in the morning for a right lower extremity duplex to further evaluate this right lower extremity discomfort and pain.  If he does have an acute DVT in his right lower extremity in the setting of Xarelto he would be a good candidate for IVC filter.  My suspicion for this however is low and he'll be discharged home at this time to return for his duplex ultrasound.  His symptoms don't sound like ACS.  Vascularity returned to the ER for any new or worsening symptoms.  He'll follow-up with  his primary care physician and his cardiologist.    Lyanne CoKevin M Scarleth Brame, MD 03/19/14 [phone]

## 2014-03-19 ENCOUNTER — Ambulatory Visit (HOSPITAL_COMMUNITY): Payer: BLUE CROSS/BLUE SHIELD | Attending: Emergency Medicine

## 2014-03-28 ENCOUNTER — Other Ambulatory Visit: Payer: BC Managed Care – PPO

## 2014-04-04 ENCOUNTER — Other Ambulatory Visit (INDEPENDENT_AMBULATORY_CARE_PROVIDER_SITE_OTHER): Payer: BLUE CROSS/BLUE SHIELD | Admitting: *Deleted

## 2014-04-04 ENCOUNTER — Ambulatory Visit (INDEPENDENT_AMBULATORY_CARE_PROVIDER_SITE_OTHER): Payer: BLUE CROSS/BLUE SHIELD | Admitting: Cardiology

## 2014-04-04 ENCOUNTER — Encounter: Payer: Self-pay | Admitting: Cardiology

## 2014-04-04 VITALS — BP 136/96 | HR 75 | Ht 75.0 in | Wt 255.8 lb

## 2014-04-04 DIAGNOSIS — E785 Hyperlipidemia, unspecified: Secondary | ICD-10-CM

## 2014-04-04 DIAGNOSIS — I480 Paroxysmal atrial fibrillation: Secondary | ICD-10-CM

## 2014-04-04 DIAGNOSIS — I119 Hypertensive heart disease without heart failure: Secondary | ICD-10-CM

## 2014-04-04 LAB — CBC WITH DIFFERENTIAL/PLATELET
BASOS PCT: 0.2 % (ref 0.0–3.0)
Basophils Absolute: 0 10*3/uL (ref 0.0–0.1)
Eosinophils Absolute: 0.1 10*3/uL (ref 0.0–0.7)
Eosinophils Relative: 1.2 % (ref 0.0–5.0)
HEMATOCRIT: 49.2 % (ref 39.0–52.0)
Hemoglobin: 16.7 g/dL (ref 13.0–17.0)
LYMPHS ABS: 1.9 10*3/uL (ref 0.7–4.0)
Lymphocytes Relative: 23.6 % (ref 12.0–46.0)
MCHC: 34 g/dL (ref 30.0–36.0)
MCV: 86.3 fl (ref 78.0–100.0)
MONOS PCT: 7 % (ref 3.0–12.0)
Monocytes Absolute: 0.6 10*3/uL (ref 0.1–1.0)
NEUTROS ABS: 5.6 10*3/uL (ref 1.4–7.7)
Neutrophils Relative %: 68 % (ref 43.0–77.0)
Platelets: 189 10*3/uL (ref 150.0–400.0)
RBC: 5.7 Mil/uL (ref 4.22–5.81)
RDW: 16.1 % — AB (ref 11.5–15.5)
WBC: 8.2 10*3/uL (ref 4.0–10.5)

## 2014-04-04 LAB — LIPID PANEL
Cholesterol: 189 mg/dL (ref 0–200)
HDL: 40.6 mg/dL (ref >39.00)
LDL Cholesterol: 125 mg/dL — ABNORMAL HIGH (ref 0–99)
NonHDL: 148.4
Total CHOL/HDL Ratio: 5
Triglycerides: 116 mg/dL (ref 0.0–149.0)
VLDL: 23.2 mg/dL (ref 0.0–40.0)

## 2014-04-04 LAB — HEPATIC FUNCTION PANEL
ALT: 30 U/L (ref 0–53)
AST: 29 U/L (ref 0–37)
Albumin: 4.1 g/dL (ref 3.5–5.2)
Alkaline Phosphatase: 91 U/L (ref 39–117)
BILIRUBIN TOTAL: 0.6 mg/dL (ref 0.2–1.2)
Bilirubin, Direct: 0.1 mg/dL (ref 0.0–0.3)
Total Protein: 7 g/dL (ref 6.0–8.3)

## 2014-04-04 NOTE — Progress Notes (Signed)
Cardiology Office Note   Date:  04/04/2014   ID:  Isaiah RavensBlake S Alejo, DOB Dec 11, 1960, MRN [id number]  PCP:  Thora LanceEHINGER,ROBERT R, MD  Cardiologist:   Cassell Clementhomas Grae Leathers, MD   No chief complaint on file.     History of Present Illness: Isaiah [name] is a 54 y.o. 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 He was [facility] with bilateral pulmonary emboli manifested by exertional dyspnea and cough and hemoptysis. He has had a followup evaluation for hypercoagulability showing that he has an abnormal lupus anticoagulant. He will need to stay on lifelong anticoagulation. He has had past history of frequent DVT. On this admission venous Dopplers did not localize any new areas of DVT He has a past history of paroxysmal atrial fibrillation. He also has a history of hypothyroidism and a history of hypercholesterolemia and history of essential hypertension. Patient has a history of hypothyroidism and sees Dr. Evlyn [facility]. In January 2014 the patient experienced some chest tightness and underwent a treadmill Myoview stress test which showed no evidence of ischemia and his ejection fraction was 53% with no wall motion abnormalities. The chest tightness was occurring in the setting of a lot of emotional distress. The symptoms seem to have improved since the normal stress test. On 03/18/2014 he went to the emergency room because of chest pain which was diagnosed as reflux and he was placed on omeprazole with relief.  He has had no further symptoms. His blood pressure has improved and on his own he cut back his lisinopril to just 20 mg once a day. He is being treated for suspected Lyme disease by a specialized clinic in [place]. He has been placed on a gluten-free diet. He is on a complex antibiotic regimen which includes simultaneous Minocin, Septra, and Omnicef. He has been  having a lot of arthralgias and myalgias in his low back and in his upper thighs.  He has been receiving intravenous vitamin C infusions from Dr. Ananias PilgrimVaughan. He is not on statins but is on ezetimibe.  He has been told by Dr. Darrelyn HillockGioffre that he needs to have his right hip replaced.  Past Medical History  Diagnosis Date  . Hypertension   . Hyperlipidemia   . Situational stress   . Diastolic dysfunction     Grade I per echo in 2/12  . PAF (paroxysmal atrial fibrillation)     no recurrence since Feb 2012  . PONV (postoperative nausea and vomiting)   . DVT (deep venous thrombosis) 2000's    "both legs in the calves; left went all the way up to my groin" (09/17/2012)  . Pulmonary embolism 09/17/2012    "just today; 3 on the right; 2 on the left" (09/17/2012)  . Exertional shortness of breath     "just yesterday" (09/17/2012)  . Hypothyroidism     "wiped out from taking amiodarone" (09/17/2012)    Past Surgical History  Procedure Laterality Date  . Cardiovascular stress test  07/17/2009    EF 59%  . Transthoracic echocardiogram  03/05/2010    EF 60-65%  . Shoulder surgery      left  . Inguinal hernia repair Right 1992  . Inguinal hernia repair Left 2013  . Distal biceps tendon repair Right 2011  . Shoulder arthroscopy w/ rotator cuff repair Left 09/04/2012  . Cardioversion       Current Outpatient Prescriptions  Medication Sig Dispense Refill  . ALPRAZolam (XANAX) 0.25 MG tablet Take 1 tablet (0.25 mg total) by mouth 2 (two) times daily as needed for anxiety. 180 tablet 1  . amLODipine (NORVASC) 5 MG tablet Take 1 tablet (5 mg total) by mouth daily. 90 tablet 3  . atovaquone (MEPRON) 750 MG/5ML suspension Take 30 mg by mouth daily.   0  . cefdinir (OMNICEF) 300 MG capsule Take 300 mg by mouth as needed. Take every day per patient    . ezetimibe (ZETIA) 10 MG tablet Take 1 tablet (10 mg total) by mouth daily. 90 tablet 3  . fluconazole (DIFLUCAN) 200 MG tablet Take 200 mg by mouth as needed.   0    . furosemide (LASIX) 20 MG tablet Take 1 tablet (20 mg total) by mouth daily as needed for fluid. 90 tablet 3  . levothyroxine (SYNTHROID, LEVOTHROID) 200 MCG tablet Take 110 mcg by mouth 2 (two) times daily.     . metoprolol succinate (TOPROL-XL) 25 MG 24 hr tablet TAKE ONE TABLET BY MOUTH EVERY MORNING 90 tablet 3  . metroNIDAZOLE (FLAGYL) 500 MG tablet Take 500 mg by mouth See admin instructions. Wednesday and thursdays of every fourth week  0  . minocycline (DYNACIN) 100 MG tablet Take 100 mg by mouth 2 (two) times daily.    . Multiple Vitamin (MULTIVITAMIN) tablet Take 1 tablet by mouth daily.      Marland Kitchen omega-3 acid ethyl esters (LOVAZA) 1 G capsule Take 2 capsules (2 g total) by mouth 2 (two) times daily. 360 capsule 3  . omeprazole (PRILOSEC) 20 MG capsule Take 1 capsule (20 mg total) by mouth daily. 30 capsule 0  . propafenone (RYTHMOL SR) 325 MG 12 hr capsule Take 1 capsule (325 mg total) by mouth 2 (two) times daily. 180 capsule 3  . rivaroxaban (XARELTO) 20 MG TABS tablet Take 1 tablet (20 mg total) by mouth daily with supper. 90 tablet 3   No current facility-administered medications for this visit.    Allergies:   Crestor and Lipitor    Social History:  The patient  reports that he has never smoked. He has never used smokeless tobacco. He reports that he drinks about 4.8 oz of alcohol per week. He reports that he does not use illicit drugs.   Family History:  The patient's family history includes Heart attack in his maternal grandfather; Hyperlipidemia in his mother; Hypertension in his mother.    ROS:  Please see the history of present illness.   Otherwise, review of systems are positive for none.   All other systems are reviewed and negative.    PHYSICAL EXAM: VS:  BP 136/96 mmHg  Pulse 75  Ht  (1.905 m)  Wt 255 lb 12.8 oz (116.03 kg)  BMI 31.97 kg/m2 , BMI Body mass index is 31.97 kg/(m^2). GEN: Well nourished, well developed, in no acute distress HEENT:  normal Neck: no JVD, carotid bruits, or masses Cardiac: RRR; no murmurs, rubs, or gallops,no edema  Respiratory:  clear to auscultation bilaterally, normal work of breathing GI: soft, nontender, nondistended, + BS MS: no deformity or atrophy Skin: warm and dry, no rash Neuro:  Strength and sensation are intact Psych: euthymic mood, full affect   EKG:  EKG is not ordered today.    Recent Labs: 11/29/2013: TSH 3.70 03/18/2014: BUN 17; Creatinine 1.47*; Potassium 4.6; Sodium 138 04/04/2014: ALT 30; Hemoglobin 16.7; Platelets 189.0    Lipid Panel    Component Value  Date/Time   CHOL 189 04/04/2014 0802   CHOL 206* 05/10/2013 1037   TRIG 116.0 04/04/2014 0802   HDL 40.60 04/04/2014 0802   HDL 47 05/10/2013 1037   CHOLHDL 5 04/04/2014 0802   CHOLHDL 4.4 05/10/2013 1037   VLDL 23.2 04/04/2014 0802   LDLCALC 125* 04/04/2014 0802   LDLCALC 147* 05/10/2013 1037      Wt Readings from Last 3 Encounters:  04/04/14 255 lb 12.8 oz (116.03 kg)  03/18/14 250 lb (113.399 kg)  11/29/13 259 lb (117.482 kg)         ASSESSMENT AND PLAN: 1.  Hypercoagulable syndrome secondary to circulating lupus anticoagulant.  Past history of pulmonary emboli and past history of DVTs.  He is on lifelong anticoagulation with Xarelto. 2.  Paroxysmal atrial fibrillation 3.  Hypercholesterolemia 4.  Essential hypertension 5.  Hypothyroidism followed by Dr. Evlyn Kanner 6.  History of Lyme disease treated at a out-of-state Lyme disease Clinic 7.  Osteoarthritis of right hip followed by Dr. Darrelyn Hillock    Current medicines are reviewed at length with the patient today.  The patient does not have concerns regarding medicines.  The following changes have been made:  no change    Orders Placed This Encounter  Procedures  . Lipid panel  . Hepatic function panel  . Basic metabolic panel  . TSH  . CBC with Differential/Platelet  . T4, free     Continue current medication.  Recheck in 4 months for office  visit EKG CBC TSH free T4 and lipid panel hepatic function panel and basal metabolic panel   Signed, Cassell Clement, MD  04/04/2014 5:36 PM    [facility] [address] [place], [place], [place]  [address] Phone: [phone]; Fax: [phone]

## 2014-04-04 NOTE — Patient Instructions (Signed)
Your physician recommends that you continue on your current medications as directed. Please refer to the Current Medication list given to you today.  Your physician wants you to follow-up in: 4 months with fasting labs (lp/bmet/hfp/tsh/ft4/cbc)  You will receive a reminder letter in the mail two months in advance. If you don't receive a letter, please call our office to schedule the follow-up appointment.

## 2014-04-04 NOTE — Addendum Note (Signed)
Addended by: Tonita PhoenixBOWDEN, Duey Liller K on: 04/04/2014 08:02 AM   Modules accepted: Orders

## 2014-04-04 NOTE — Addendum Note (Signed)
Addended by: Tonita PhoenixBOWDEN, ROBIN K on: 04/04/2014 08:01 AM   Modules accepted: Orders

## 2014-04-06 NOTE — Progress Notes (Signed)
Quick Note:  Please report to patient. The recent labs are stable. Continue same medication and careful diet.Lipids are better. ______ 

## 2014-04-08 ENCOUNTER — Telehealth: Payer: Self-pay | Admitting: Cardiology

## 2014-04-08 NOTE — Telephone Encounter (Signed)
Left message to call back  

## 2014-04-08 NOTE — Telephone Encounter (Signed)
Pt returned call for labs. Pt stated that Dr. Patty SermonsBrackbill had already went over labs when pt was in office. Pt appreciative for call.

## 2014-04-08 NOTE — Telephone Encounter (Signed)
New Msg       Pt states he is returning call from yesterday.    Please return call.

## 2014-04-17 ENCOUNTER — Telehealth: Payer: Self-pay

## 2014-04-17 MED ORDER — LISINOPRIL 20 MG PO TABS: 20.0000 mg | ORAL_TABLET | Freq: Every day | ORAL | Status: DC

## 2014-04-17 NOTE — Telephone Encounter (Signed)
Spoke with patient and he has not been taking Lisinopril and blood pressure has been going up Would like to go back on Lisinopril  Per  Dr. Patty SermonsBrackbill ok to resume Lisinopril 20 mg one daily, continue to monitor at home Advised patient

## 2014-04-25 ENCOUNTER — Telehealth: Payer: Self-pay | Admitting: Cardiology

## 2014-04-25 DIAGNOSIS — R0681 Apnea, not elsewhere classified: Secondary | ICD-10-CM

## 2014-04-25 DIAGNOSIS — R5383 Other fatigue: Secondary | ICD-10-CM

## 2014-04-25 NOTE — Telephone Encounter (Signed)
Spoke with patient and he states he is always sleepy and can fall asleep just sitting around Per patient significant other says he stops breathing at night while he is sleeping  Discussed with  Dr. Patty SermonsBrackbill and place for sleep study

## 2014-04-25 NOTE — Telephone Encounter (Signed)
New MEssage  Pt calling to speak w/ Rn about sleep study. Please call back and discuss.

## 2014-04-25 NOTE — Telephone Encounter (Signed)
Left message to call back  

## 2014-05-01 ENCOUNTER — Ambulatory Visit (HOSPITAL_BASED_OUTPATIENT_CLINIC_OR_DEPARTMENT_OTHER): Payer: BLUE CROSS/BLUE SHIELD | Attending: Cardiology | Admitting: *Deleted

## 2014-05-01 VITALS — Ht 75.0 in | Wt 250.0 lb

## 2014-05-01 DIAGNOSIS — R0683 Snoring: Secondary | ICD-10-CM | POA: Insufficient documentation

## 2014-05-01 DIAGNOSIS — G4733 Obstructive sleep apnea (adult) (pediatric): Secondary | ICD-10-CM

## 2014-05-01 DIAGNOSIS — R5383 Other fatigue: Secondary | ICD-10-CM

## 2014-05-01 DIAGNOSIS — R0681 Apnea, not elsewhere classified: Secondary | ICD-10-CM

## 2014-05-01 DIAGNOSIS — G4719 Other hypersomnia: Secondary | ICD-10-CM | POA: Diagnosis present

## 2014-05-02 ENCOUNTER — Telehealth: Payer: Self-pay | Admitting: Cardiology

## 2014-05-02 DIAGNOSIS — G4733 Obstructive sleep apnea (adult) (pediatric): Secondary | ICD-10-CM | POA: Insufficient documentation

## 2014-05-02 NOTE — Telephone Encounter (Signed)
Please let patient know that they have sleep apnea and had successful PAP titration. Please setup appointment in 10 weeks. Please let [facility] know that order for PAP is in EPIC.   

## 2014-05-02 NOTE — Sleep Study (Signed)
   NAME: Isaiah [name] DATE OF BIRTH:  [birth year] MEDICAL RECORD NUMBER [id number]  LOCATION: [facility]  PHYSICIAN: TURNER,TRACI R  DATE OF STUDY: 05/01/2014  SLEEP STUDY TYPE: Split Night Nocturnal Polysomnogram with CPAP titration               REFERRING PHYSICIAN: Cassell ClementBrackbill, Thomas, MD  INDICATION FOR STUDY: witnessed apnea, snoring, excessive daytime sleepiness  EPWORTH SLEEPINESS SCORE: 18 HEIGHT: 6\' 3"  (190.5 cm)  WEIGHT: 250 lb (113.399 kg)    Body mass index is 31.25 kg/(m^2).  NECK SIZE: 18.5 in.  MEDICATIONS: Reviewed in the chart  SLEEP ARCHITECTURE: During the diagnostic portion of the study, the total sleep time was 129 minutes out of a total sleep period of 147 minutes.  There was no slow wave sleep and 7 minutes of REM sleep.  The onset to sleep latency was 8 minutes and onset to REM sleep latency was 104 minutes.  The sleep efficiency was slightly reduced at 83%.  There was an increased frequency of arousals due to respiratory events.  During the CPAP titration, the total sleep time was 227 minutes out of a sleep period time of 235 minutes.  There was no slow wave sleep and 34 minutes of REM sleep.  The onset to sleep latency was 21 minutes and onset to REM sleep latency was 51 minutes.  The sleep latency was normal at 88%.    RESPIRATORY DATA: During the diagnostic portion of the study, there were 32 apneas, all of which were obstructive apneas.  There were 27 hypopneas.  Most events occurred in NREM sleep in the nonsupine position.  The AHI was 33 events per hour consistent with severe obstructive sleep apnea/hypopnea syndrome.  There was moderate snoring noted.  The patient was started on CPAP at 5cm H2O and titrated for respiratory events and snoring to 14cm H2O.  Snoring was eliminated with CPAP.    OXYGEN DATA: The average oxygen saturation during the diagnostic portion was 92% and the minimal oxygen saturation was 82%.  During the CPAP  titration, the lowest oxygen saturation was 88% and the average oxygen saturation was 94%.    CARDIAC DATA: The patient maintained NSR with sinus bradycardia during the study with an average heart rate of 58 bpm and a minimum hear rate of 36 bpm.    MOVEMENT/PARASOMNIA: There were no periodic limb movement disorders and no REM sleep behavior disorders  IMPRESSION/ RECOMMENDATION:   1.  Severe obstructive sleep apnea/hypopnea syndrome with an AHI of 33 events per hour.  Most events occurred in NREM sleep in the nonsupine position. 2.  Moderate snoring was noted that was eliminated with CPAP. 3.  Reduced sleep efficiency with increased frequency of arousals due to respiratory events.   4.  Abnormal sleep architecture with no slow wave sleep 5.  Oxygen desaturations as low as 82% with respiratory events during the diagnostic portion.  The total time spent with oxygen desaturations below 88% during the CPAP titration was 0. 6.  Successful CPAP titration to 14cm H2O.   7.  Recommend CPAP at 14cm H2O with heated humidifier and large [employer] Simplus Full Face Mask  Signed: Quintella ReichertURNER,TRACI R Diplomate, American Board of Sleep Medicine  ELECTRONICALLY SIGNED ON:  05/02/2014, 12:49 PM [facility] PH: [phone]   FX: [phone] ACCREDITED BY THE AMERICAN ACADEMY OF SLEEP MEDICINE

## 2014-05-02 NOTE — Addendum Note (Signed)
Addended by: Armanda MagicURNER, Nevyn Bossman R on: 05/02/2014 01:29 PM   Modules accepted: Orders

## 2014-05-02 NOTE — Telephone Encounter (Signed)
Pt is aware of results, 10 Follow-up scheduled for 07/18/14 I have sent [facility] a message to let them know orders are in.

## 2014-05-02 NOTE — Telephone Encounter (Signed)
Patient had sleep study

## 2014-05-07 ENCOUNTER — Other Ambulatory Visit: Payer: Self-pay | Admitting: *Deleted

## 2014-05-07 DIAGNOSIS — F419 Anxiety disorder, unspecified: Secondary | ICD-10-CM

## 2014-05-07 MED ORDER — ALPRAZOLAM 0.25 MG PO TABS: 0.2500 mg | ORAL_TABLET | Freq: Two times a day (BID) | ORAL | Status: DC | PRN

## 2014-05-08 ENCOUNTER — Other Ambulatory Visit: Payer: Self-pay

## 2014-05-08 DIAGNOSIS — F419 Anxiety disorder, unspecified: Secondary | ICD-10-CM

## 2014-05-28 ENCOUNTER — Other Ambulatory Visit: Payer: Self-pay | Admitting: Cardiology

## 2014-06-24 ENCOUNTER — Encounter: Payer: Self-pay | Admitting: Cardiology

## 2014-07-17 ENCOUNTER — Encounter: Payer: Self-pay | Admitting: Cardiology

## 2014-07-17 DIAGNOSIS — E669 Obesity, unspecified: Secondary | ICD-10-CM

## 2014-07-17 HISTORY — DX: Obesity, unspecified: E66.9

## 2014-07-17 NOTE — Progress Notes (Signed)
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  He was recently referred for PSG by Dr. Patty Sermons due to witnessed apnea, snoring and excessive daytime sleepiness.  Sleep study showed severe OSA with an AHI of 33 events per hour.  He had oxygen desaturations as low as 82%.  There was moderate snoring.  His Epworth sleepiness scale was elevated at 18.  He underwent CPAP titration to 14cm H2O using a [employer] full face mask.  He now presents today for followup. He is doing well with his device.  He tolerates the nasal pillow mask with chin strap and feels the pressure is adequate.  Since starting CPAP feels rested in the am and has no daytime sleepiness.  He has no mouth dryness or congestion.    Past Medical History  Diagnosis Date  . Hypertension   . Hyperlipidemia   . Situational stress   . Diastolic dysfunction     Grade I per echo in 2/12  . PAF (paroxysmal atrial fibrillation)     no recurrence since Feb 2012  . PONV (postoperative nausea and vomiting)   . DVT (deep venous thrombosis) 2000's    "both legs in the calves; left went all the way up to my groin" (09/17/2012)  . Pulmonary embolism 09/17/2012    "just today; 3 on the right; 2 on the left" (09/17/2012)  . Exertional shortness of breath     "just yesterday" (09/17/2012)  . Hypothyroidism     "wiped out from taking amiodarone" (09/17/2012)  . Obesity (BMI 30-39.9) 07/17/2014    Past Surgical History  Procedure Laterality Date  . Cardiovascular stress test  07/17/2009    EF 59%  . Transthoracic echocardiogram  03/05/2010    EF 60-65%  . Shoulder surgery      left  . Inguinal hernia repair Right 1992  . Inguinal hernia repair Left 2013  . Distal biceps tendon repair Right  2011  . Shoulder arthroscopy w/ rotator cuff repair Left 09/04/2012  . Cardioversion       Current Outpatient Prescriptions  Medication Sig Dispense Refill  . ALPRAZolam (XANAX) 0.25 MG tablet Take 1 tablet (0.25 mg total) by mouth 2 (two) times daily as needed for anxiety. 180 tablet 1  . amLODipine (NORVASC) 5 MG tablet Take 1 tablet (5 mg total) by mouth daily. 90 tablet 3  . atovaquone (MEPRON) 750 MG/5ML suspension Take 30 mg by mouth daily.   0  . cefdinir (OMNICEF) 300 MG capsule Take 300 mg by mouth as needed. Take every day per patient    . ezetimibe (ZETIA) 10 MG tablet Take 1 tablet (10 mg total) by mouth daily. 90 tablet 3  . fluconazole (DIFLUCAN) 200 MG tablet Take 200 mg by mouth as needed.   0  . furosemide (LASIX) 20 MG tablet Take 1 tablet (20 mg total) by mouth daily as needed for fluid. 90 tablet 3  . lisinopril (PRINIVIL,ZESTRIL) 20 MG tablet Take 1 tablet (20 mg total) by mouth daily. 90 tablet 1  . metoprolol succinate (TOPROL-XL) 25 MG 24 hr tablet TAKE ONE TABLET BY  MOUTH EVERY MORNING 90 tablet 3  . metroNIDAZOLE (FLAGYL) 500 MG tablet Take 500 mg by mouth See admin instructions. Wednesday and thursdays of every fourth week  0  . minocycline (DYNACIN) 100 MG tablet Take 100 mg by mouth 2 (two) times daily.    . Multiple Vitamin (MULTIVITAMIN) tablet Take 1 tablet by mouth daily.      Marland Kitchen. omega-3 acid ethyl esters (LOVAZA) 1 G capsule Take 2 capsules (2 g total) by mouth 2 (two) times daily. 360 capsule 3  . omeprazole (PRILOSEC) 20 MG capsule Take 1 capsule (20 mg total) by mouth daily. 30 capsule 0  . propafenone (RYTHMOL SR) 325 MG 12 hr capsule Take 1 capsule (325 mg total) by mouth 2 (two) times daily. 180 capsule 3  . rivaroxaban (XARELTO) 20 MG TABS tablet Take 1 tablet (20 mg total) by mouth daily with supper. 90 tablet 3  . SYNTHROID 150 MCG tablet Take 300 mcg by mouth daily.   0   No current facility-administered medications for this visit.    Allergies:    Crestor and Lipitor    Social History:  The patient  reports that he has never smoked. He has never used smokeless tobacco. He reports that he drinks about 4.8 oz of alcohol per week. He reports that he does not use illicit drugs.   Family History:  The patient's family history includes Heart attack in his maternal grandfather; Hyperlipidemia in his mother; Hypertension in his mother.    ROS:  Please see the history of present illness.   Otherwise, review of systems are positive for none.   All other systems are reviewed and negative.    PHYSICAL EXAM: VS:  BP 124/78 mmHg  Pulse 72  Ht 6\' 3"  (1.905 m)  Wt 253 lb (114.76 kg)  BMI 31.62 kg/m2 , BMI Body mass index is 31.62 kg/(m^2). GEN: Well nourished, well developed, in no acute distress HEENT: normal Neck: no JVD, carotid bruits, or masses Cardiac: RRR; no murmurs, rubs, or gallops,no edema  Respiratory:  clear to auscultation bilaterally, normal work of breathing GI: soft, nontender, nondistended, + BS MS: no deformity or atrophy Skin: warm and dry, no rash Neuro:  Strength and sensation are intact Psych: euthymic mood, full affect   EKG:  EKG is not ordered today.    Recent Labs: 11/29/2013: TSH 3.70 03/18/2014: BUN 17; Creatinine, Ser 1.47*; Potassium 4.6; Sodium 138 04/04/2014: ALT 30; Hemoglobin 16.7; Platelets 189.0    Lipid Panel    Component Value Date/Time   CHOL 189 04/04/2014 0802   CHOL 206* 05/10/2013 1037   TRIG 116.0 04/04/2014 0802   HDL 40.60 04/04/2014 0802   HDL 47 05/10/2013 1037   CHOLHDL 5 04/04/2014 0802   CHOLHDL 4.4 05/10/2013 1037   VLDL 23.2 04/04/2014 0802   LDLCALC 125* 04/04/2014 0802   LDLCALC 147* 05/10/2013 1037      Wt Readings from Last 3 Encounters:  07/18/14 253 lb (114.76 kg)  05/01/14 250 lb (113.399 kg)  04/04/14 255 lb 12.8 oz (116.03 kg)        ASSESSMENT AND PLAN:  1.  Severe OSA on CPAP and tolerating well.  He has had an improvement in his daytime sleepiness  since going on CPAP.  He will continue on current settings.  Please let patient know that sleep study showed no significant sleep apnea.  I have also instructed the patient on proper sleep hygiene, avoidance of sleeping in the supine position and avoidance  of alcohol within 4 hours of bedtime.  The patient was also instructed to avoid driving if sleepy.  His d/l today showed an AHI of 1/hr on 14cm H2O and 100% compliance in using more than 4 hours nightly.  2.  HTN- controlled on amlodipine/ACE I/BB 3.  Obesity - he lift weights, swims and walks for exercise.  He is getting a THR in the fall.  He has Lyme's and is fatigued which limits his exercise.    Current medicines are reviewed at length with the patient today.  The patient does not have concerns regarding medicines.  The following changes have been made:  no change  Labs/ tests ordered today: See above Assessment and Plan No orders of the defined types were placed in this encounter.     Disposition:   FU with me in 6 months  Signed, Quintella Reichert, MD  07/18/2014 8:15 AM    [facility] [address] [place], [place], [place]  [address] Phone: [phone]; Fax: [phone]

## 2014-07-18 ENCOUNTER — Other Ambulatory Visit: Payer: Self-pay | Admitting: *Deleted

## 2014-07-18 ENCOUNTER — Encounter: Payer: Self-pay | Admitting: Cardiology

## 2014-07-18 ENCOUNTER — Ambulatory Visit (INDEPENDENT_AMBULATORY_CARE_PROVIDER_SITE_OTHER): Payer: BLUE CROSS/BLUE SHIELD | Admitting: Cardiology

## 2014-07-18 VITALS — BP 124/78 | HR 72 | Ht 75.0 in | Wt 253.0 lb

## 2014-07-18 DIAGNOSIS — I10 Essential (primary) hypertension: Secondary | ICD-10-CM

## 2014-07-18 DIAGNOSIS — G4733 Obstructive sleep apnea (adult) (pediatric): Secondary | ICD-10-CM | POA: Diagnosis not present

## 2014-07-18 DIAGNOSIS — E669 Obesity, unspecified: Secondary | ICD-10-CM

## 2014-07-18 MED ORDER — LISINOPRIL 20 MG PO TABS: 20.0000 mg | ORAL_TABLET | Freq: Every day | ORAL | Status: DC

## 2014-07-18 MED ORDER — PROPAFENONE HCL ER 325 MG PO CP12: 325.0000 mg | ORAL_CAPSULE | Freq: Two times a day (BID) | ORAL | Status: DC

## 2014-07-18 NOTE — Patient Instructions (Signed)
Medication Instructions:  Your physician recommends that you continue on your current medications as directed. Please refer to the Current Medication list given to you today.   Labwork: NONE  Testing/Procedures: NONE  Follow-Up: Your physician wants you to follow-up in: 6 months with Dr. Mayford Knifeurner. You will receive a reminder letter in the mail two months in advance. If you don't receive a letter, please call our office to schedule the follow-up appointment.   Any Other Special Instructions Will Be Listed Below (If Applicable).

## 2014-08-01 ENCOUNTER — Encounter: Payer: Self-pay | Admitting: Cardiology

## 2014-09-01 ENCOUNTER — Ambulatory Visit (HOSPITAL_BASED_OUTPATIENT_CLINIC_OR_DEPARTMENT_OTHER)
Admission: RE | Admit: 2014-09-01 | Discharge: 2014-09-01 | Disposition: A | Discharge status: Home / Self Care | Payer: BLUE CROSS/BLUE SHIELD | Source: Ambulatory Visit | Attending: Endocrinology | Admitting: Endocrinology

## 2014-09-01 ENCOUNTER — Other Ambulatory Visit (HOSPITAL_COMMUNITY): Payer: Self-pay | Admitting: Endocrinology

## 2014-09-01 DIAGNOSIS — R109 Unspecified abdominal pain: Secondary | ICD-10-CM

## 2014-09-01 DIAGNOSIS — R591 Generalized enlarged lymph nodes: Secondary | ICD-10-CM

## 2014-09-01 DIAGNOSIS — R140 Abdominal distension (gaseous): Secondary | ICD-10-CM

## 2014-09-01 DIAGNOSIS — R599 Enlarged lymph nodes, unspecified: Secondary | ICD-10-CM

## 2014-09-02 ENCOUNTER — Other Ambulatory Visit: Payer: Self-pay

## 2014-09-02 NOTE — Telephone Encounter (Signed)
Ordered Medications       Disp Refills Start End                amLODipine (NORVASC) 5 MG tablet 90 tablet 3 11/29/2013     Take 1 tablet (5 mg total) by mouth daily. - Oral

## 2014-10-01 ENCOUNTER — Encounter: Payer: Self-pay | Admitting: Cardiology

## 2014-10-29 ENCOUNTER — Other Ambulatory Visit: Payer: Self-pay | Admitting: *Deleted

## 2014-10-29 DIAGNOSIS — F419 Anxiety disorder, unspecified: Secondary | ICD-10-CM

## 2014-10-29 MED ORDER — ALPRAZOLAM 0.25 MG PO TABS: 0.2500 mg | ORAL_TABLET | Freq: Two times a day (BID) | ORAL | Status: DC | PRN

## 2014-11-11 ENCOUNTER — Ambulatory Visit: Payer: Self-pay | Admitting: Orthopedic Surgery

## 2014-11-11 NOTE — Progress Notes (Signed)
Preoperative surgical orders have been place into the Epic hospital system for France RavensBlake S Hudon MD on 11/11/2014, 1:53 PM  by Patrica DuelPERKINS, Karole Oo for surgery on 11-26-2014.  Preop Total Hip - Anterior Approach orders including IV Tylenol, and IV Decadron as long as there are no contraindications to the above medications. Avel Peacerew Elise Knobloch, PA-C

## 2014-11-21 ENCOUNTER — Encounter (HOSPITAL_COMMUNITY): Payer: Self-pay

## 2014-11-21 ENCOUNTER — Ambulatory Visit (INDEPENDENT_AMBULATORY_CARE_PROVIDER_SITE_OTHER): Payer: BLUE CROSS/BLUE SHIELD | Admitting: Cardiology

## 2014-11-21 ENCOUNTER — Encounter (HOSPITAL_COMMUNITY)
Admission: RE | Admit: 2014-11-21 | Discharge: 2014-11-21 | Disposition: A | Discharge status: Home / Self Care | Payer: BLUE CROSS/BLUE SHIELD | Source: Ambulatory Visit | Attending: Orthopedic Surgery | Admitting: Orthopedic Surgery

## 2014-11-21 ENCOUNTER — Encounter: Payer: Self-pay | Admitting: Cardiology

## 2014-11-21 VITALS — BP 128/80 | HR 64 | Ht 75.0 in | Wt 259.8 lb

## 2014-11-21 DIAGNOSIS — I10 Essential (primary) hypertension: Secondary | ICD-10-CM | POA: Diagnosis not present

## 2014-11-21 DIAGNOSIS — I480 Paroxysmal atrial fibrillation: Secondary | ICD-10-CM | POA: Diagnosis not present

## 2014-11-21 DIAGNOSIS — M1611 Unilateral primary osteoarthritis, right hip: Secondary | ICD-10-CM | POA: Diagnosis not present

## 2014-11-21 DIAGNOSIS — I2699 Other pulmonary embolism without acute cor pulmonale: Secondary | ICD-10-CM

## 2014-11-21 DIAGNOSIS — Z01818 Encounter for other preprocedural examination: Secondary | ICD-10-CM | POA: Diagnosis not present

## 2014-11-21 HISTORY — DX: Disease of blood and blood-forming organs, unspecified: D75.9

## 2014-11-21 HISTORY — DX: Personal history of other diseases of the respiratory system: Z87.09

## 2014-11-21 HISTORY — DX: Gastro-esophageal reflux disease without esophagitis: K21.9

## 2014-11-21 HISTORY — DX: Cardiac arrhythmia, unspecified: I49.9

## 2014-11-21 HISTORY — DX: Lyme disease, unspecified: A69.20

## 2014-11-21 HISTORY — DX: Sleep apnea, unspecified: G47.30

## 2014-11-21 HISTORY — DX: Other primary thrombophilia: D68.59

## 2014-11-21 LAB — COMPREHENSIVE METABOLIC PANEL
ALBUMIN: 4.2 g/dL (ref 3.5–5.0)
ALT: 29 U/L (ref 17–63)
AST: 34 U/L (ref 15–41)
Alkaline Phosphatase: 87 U/L (ref 38–126)
Anion gap: 5 (ref 5–15)
BILIRUBIN TOTAL: 0.9 mg/dL (ref 0.3–1.2)
BUN: 21 mg/dL — AB (ref 6–20)
CO2: 28 mmol/L (ref 22–32)
CREATININE: 1.35 mg/dL — AB (ref 0.61–1.24)
Calcium: 9.5 mg/dL (ref 8.9–10.3)
Chloride: 105 mmol/L (ref 101–111)
GFR calc Af Amer: >60 mL/min (ref >60)
GFR, EST NON AFRICAN AMERICAN: 59 mL/min — AB (ref >60)
GLUCOSE: 94 mg/dL (ref 65–99)
Potassium: 4.1 mmol/L (ref 3.5–5.1)
Sodium: 138 mmol/L (ref 135–145)
TOTAL PROTEIN: 7.6 g/dL (ref 6.5–8.1)

## 2014-11-21 LAB — URINALYSIS, ROUTINE W REFLEX MICROSCOPIC
BILIRUBIN URINE: NEGATIVE
Glucose, UA: NEGATIVE mg/dL
HGB URINE DIPSTICK: NEGATIVE
Ketones, ur: NEGATIVE mg/dL
Leukocytes, UA: NEGATIVE
NITRITE: NEGATIVE
PROTEIN: NEGATIVE mg/dL
SPECIFIC GRAVITY, URINE: 1.015 (ref 1.005–1.030)
UROBILINOGEN UA: 0.2 mg/dL (ref 0.0–1.0)
pH: 6 (ref 5.0–8.0)

## 2014-11-21 LAB — CBC
HEMATOCRIT: 47 % (ref 39.0–52.0)
HEMOGLOBIN: 15.8 g/dL (ref 13.0–17.0)
MCH: 29.6 pg (ref 26.0–34.0)
MCHC: 33.6 g/dL (ref 30.0–36.0)
MCV: 88 fL (ref 78.0–100.0)
Platelets: 214 10*3/uL (ref 150–400)
RBC: 5.34 MIL/uL (ref 4.22–5.81)
RDW: 12.8 % (ref 11.5–15.5)
WBC: 4.5 10*3/uL (ref 4.0–10.5)

## 2014-11-21 LAB — ABO/RH: ABO/RH(D): O POS

## 2014-11-21 LAB — APTT: aPTT: 37 seconds (ref 24–37)

## 2014-11-21 LAB — PROTIME-INR
INR: 1.29 (ref 0.00–1.49)
PROTHROMBIN TIME: 16.2 s — AB (ref 11.6–15.2)

## 2014-11-21 LAB — SURGICAL PCR SCREEN
MRSA, PCR: NEGATIVE
STAPHYLOCOCCUS AUREUS: NEGATIVE

## 2014-11-21 NOTE — Patient Instructions (Signed)
France RavensBlake S Hillier, [name]  11/21/2014   Your procedure is scheduled on: Wednesday November 26, 2014   Report to [facility]  Entrance [place]  elevators to 3rd floor to  Short Stay Center at 8:45 AM.  Call this number if you have problems the morning of surgery [phone]   Remember: ONLY 1 PERSON MAY GO WITH YOU TO SHORT STAY TO GET  READY MORNING OF YOUR SURGERY.  Do not eat food or drink liquids :After Midnight.     Take these medicines the morning of surgery with A SIP OF WATER: Alprazolam (Xanax) if needed; Amlodipine (Norvasc); Metoprolol; Omeprazole (Prilosec) Propafenone (Rythmol); Synthyroid                HOLD XARELTO 1 DAY PRIOR TO SURGERY                                You may not have any metal on your body including hair pins and              piercings  Do not wear jewelry,  lotions, powders or colognes, deodorant             Men may shave face and neck.   Do not bring valuables to the hospital. [facility] IS NOT             RESPONSIBLE   FOR VALUABLES.  Contacts, dentures or bridgework may not be worn into surgery.  Leave suitcase in the car. After surgery it may be brought to your room.                  Please read over the following fact sheets you were given:MRSA INFORMATION SHEET; INCENTIVE SPIROMETER; BLOOD TRANSFUSION INFORMATION SHEET _____________________________________________________________________             [facility] - Preparing [facility] Before surgery, you can play an important role.  Because skin is not sterile, your skin needs to be as free of germs as possible.  You can reduce the number of germs on your skin by washing with CHG (chlorahexidine gluconate) soap before surgery.  CHG is an antiseptic cleaner which kills germs and bonds with the skin to continue killing germs even after washing. Please DO NOT use if you have an allergy to CHG or antibacterial soaps.  If your skin becomes reddened/irritated stop using  the CHG and inform your nurse when you arrive at Short Stay. Do not shave (including legs and underarms) for at least 48 hours prior to the first CHG shower.  You may shave your face/neck. Please follow these instructions carefully:  1.  Shower with CHG Soap the night before surgery and the  morning of Surgery.  2.  If you choose to wash your hair, wash your hair first as usual with your  normal  shampoo.  3.  After you shampoo, rinse your hair and body thoroughly to remove the  shampoo.                           4.  Use CHG as you would any other liquid soap.  You can apply chg directly  to the skin and wash  Gently with a scrungie or clean washcloth.  5.  Apply the CHG Soap to your body ONLY FROM THE NECK DOWN.   Do not use on face/ open                           Wound or open sores. Avoid contact with eyes, ears mouth and genitals (private parts).                       Wash face,  Genitals (private parts) with your normal soap.             6.  Wash thoroughly, paying special attention to the area where your surgery  will be performed.  7.  Thoroughly rinse your body with warm water from the neck down.  8.  DO NOT shower/wash with your normal soap after using and rinsing off  the CHG Soap.                9.  Pat yourself dry with a clean towel.            10.  Wear clean pajamas.            11.  Place clean sheets on your bed the night of your first shower and do not  sleep with pets. Day of Surgery : Do not apply any lotions/deodorants the morning of surgery.  Please wear clean clothes to the hospital/surgery center.  FAILURE TO FOLLOW THESE INSTRUCTIONS MAY RESULT IN THE CANCELLATION OF YOUR SURGERY PATIENT SIGNATURE_________________________________  NURSE SIGNATURE__________________________________  ________________________________________________________________________   Adam Phenix  An incentive spirometer is a tool that can help keep your lungs  clear and active. This tool measures how well you are filling your lungs with each breath. Taking long deep breaths may help reverse or decrease the chance of developing breathing (pulmonary) problems (especially infection) following:  A long period of time when you are unable to move or be active. BEFORE THE PROCEDURE   If the spirometer includes an indicator to show your best effort, your nurse or respiratory therapist will set it to a desired goal.  If possible, sit up straight or lean slightly forward. Try not to slouch.  Hold the incentive spirometer in an upright position. INSTRUCTIONS FOR USE   Sit on the edge of your bed if possible, or sit up as far as you can in bed or on a chair.  Hold the incentive spirometer in an upright position.  Breathe out normally.  Place the mouthpiece in your mouth and seal your lips tightly around it.  Breathe in slowly and as deeply as possible, raising the piston or the ball toward the top of the column.  Hold your breath for 3-5 seconds or for as long as possible. Allow the piston or ball to fall to the bottom of the column.  Remove the mouthpiece from your mouth and breathe out normally.  Rest for a few seconds and repeat Steps 1 through 7 at least 10 times every 1-2 hours when you are awake. Take your time and take a few normal breaths between deep breaths.  The spirometer may include an indicator to show your best effort. Use the indicator as a goal to work toward during each repetition.  After each set of 10 deep breaths, practice coughing to be sure your lungs are clear. If you have an incision (the cut made at the time of surgery),  support your incision when coughing by placing a pillow or rolled up towels firmly against it. Once you are able to get out of bed, walk around indoors and cough well. You may stop using the incentive spirometer when instructed by your caregiver.  RISKS AND COMPLICATIONS  Take your time so you do not get  dizzy or light-headed.  If you are in pain, you may need to take or ask for pain medication before doing incentive spirometry. It is harder to take a deep breath if you are having pain. AFTER USE  Rest and breathe slowly and easily.  It can be helpful to keep track of a log of your progress. Your caregiver can provide you with a simple table to help with this. If you are using the spirometer at home, follow these instructions: [place] IF:   You are having difficultly using the spirometer.  You have trouble using the spirometer as often as instructed.  Your pain medication is not giving enough relief while using the spirometer.  You develop fever of 100.5 F (38.1 C) or higher. SEEK IMMEDIATE MEDICAL CARE IF:   You cough up bloody sputum that had not been present before.  You develop fever of 102 F (38.9 C) or greater.  You develop worsening pain at or near the incision site. MAKE SURE YOU:   Understand these instructions.  Will watch your condition.  Will get help right away if you are not doing well or get worse. Document Released: 05/16/2006 Document Revised: 03/28/2011 Document Reviewed: 07/17/2006 ExitCare Patient Information 2014 ExitCare, [place].   ________________________________________________________________________  WHAT IS A BLOOD TRANSFUSION? Blood Transfusion Information  A transfusion is the replacement of blood or some of its parts. Blood is made up of multiple cells which provide different functions.  Red blood cells carry oxygen and are used for blood loss replacement.  White blood cells fight against infection.  Platelets control bleeding.  Plasma helps clot blood.  Other blood products are available for specialized needs, such as hemophilia or other clotting disorders. BEFORE THE TRANSFUSION  Who gives blood for transfusions?   Healthy volunteers who are fully evaluated to make sure their blood is safe. This is blood bank  blood. Transfusion therapy is the safest it has ever been in the practice of medicine. Before blood is taken from a donor, a complete history is taken to make sure that person has no history of diseases nor engages in risky social behavior (examples are intravenous drug use or sexual activity with multiple partners). The donor's travel history is screened to minimize risk of transmitting infections, such as malaria. The donated blood is tested for signs of infectious diseases, such as HIV and hepatitis. The blood is then tested to be sure it is compatible with you in order to minimize the chance of a transfusion reaction. If you or a relative donates blood, this is often done in anticipation of surgery and is not appropriate for emergency situations. It takes many days to process the donated blood. RISKS AND COMPLICATIONS Although transfusion therapy is very safe and saves many lives, the main dangers of transfusion include:   Getting an infectious disease.  Developing a transfusion reaction. This is an allergic reaction to something in the blood you were given. Every precaution is taken to prevent this. The decision to have a blood transfusion has been considered carefully by your caregiver before blood is given. Blood is not given unless the benefits outweigh the risks. AFTER THE TRANSFUSION  Right after receiving a blood transfusion, you will usually feel much better and more energetic. This is especially true if your red blood cells have gotten low (anemic). The transfusion raises the level of the red blood cells which carry oxygen, and this usually causes an energy increase.  The nurse administering the transfusion will monitor you carefully for complications. HOME CARE INSTRUCTIONS  No special instructions are needed after a transfusion. You may find your energy is better. Speak with your caregiver about any limitations on activity for underlying diseases you may have. SEEK MEDICAL CARE IF:    Your condition is not improving after your transfusion.  You develop redness or irritation at the intravenous (IV) site. SEEK IMMEDIATE MEDICAL CARE IF:  Any of the following symptoms occur over the next 12 hours:  Shaking chills.  You have a temperature by mouth above 102 F (38.9 C), not controlled by medicine.  Chest, back, or muscle pain.  People around you feel you are not acting correctly or are confused.  Shortness of breath or difficulty breathing.  Dizziness and fainting.  You get a rash or develop hives.  You have a decrease in urine output.  Your urine turns a dark color or changes to pink, red, or brown. Any of the following symptoms occur over the next 10 days:  You have a temperature by mouth above 102 F (38.9 C), not controlled by medicine.  Shortness of breath.  Weakness after normal activity.  The white part of the eye turns yellow (jaundice).  You have a decrease in the amount of urine or are urinating less often.  Your urine turns a dark color or changes to pink, red, or brown. Document Released: 01/01/2000 Document Revised: 03/28/2011 Document Reviewed: 08/20/2007 [facility] Patient Information 2014 [place], [place].  _______________________________________________________________________

## 2014-11-21 NOTE — Patient Instructions (Addendum)
Medication Instructions:  Your physician recommends that you continue on your current medications as directed. Please refer to the Current Medication list given to you today.  Labwork: NONE  Testing/Procedures: NONE  Follow-Up: Your physician recommends that you schedule a follow-up appointment in:4 months with fasting labs (lp/bmet/hfp) MONTH OV/LP/BMET/HFP/CBC/TSH/FT4/CBC WITH Dawayne PatriciaLORI G NP OR [name] W PA   If you need a refill on your cardiac medications before your next appointment, please call your pharmacy.

## 2014-11-21 NOTE — Progress Notes (Signed)
PT results in epic per PAT visit 11/21/2014 sent to Dr Lequita HaltAluisio

## 2014-11-21 NOTE — Progress Notes (Signed)
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 He was previously admitted to [facility] with bilateral pulmonary emboli manifested by exertional dyspnea and cough and hemoptysis. He has had a followup evaluation for hypercoagulability showing that he has an abnormal lupus anticoagulant. He will need to stay on lifelong anticoagulation. He has had past history of frequent DVT. On this admission venous Dopplers did not localize any new areas of DVT He has a past history of paroxysmal atrial fibrillation. He also has a history of hypothyroidism and a history of hypercholesterolemia and history of essential hypertension. Patient has a history of hypothyroidism and sees Dr. Evlyn Kanner. In January 2014 the patient experienced some chest tightness and underwent a treadmill Myoview stress test which showed no evidence of ischemia and his ejection fraction was 53% with no wall motion abnormalities. The chest tightness was occurring in the setting of a lot of emotional distress. The symptoms seem to have improved since the normal stress test. On 03/18/2014 he went to the emergency room because of chest pain which was diagnosed as reflux and he was placed on omeprazole with relief. He has had no further symptoms. His blood pressure has improved and on his own he cut back his lisinopril to just 20 mg once a day. When we last saw him he was being treated for suspected Lyme disease.  He feels that the treatments have not helped and he has no longer on therapy for Lyme disease. The patient has had worsening  osteoarthritis of the right hip and is scheduled for right total hip replacement by Dr. [name] on 11/26/2014. Since last visit the patient has not been having any chest pain or shortness of breath.  He takes Lasix rarely for mild peripheral edema.  He has had no recurrence of his atrial fibrillation.  No recurrent DVT.  He has sleep apnea and uses a CPAP machine.   Past Medical History  Diagnosis Date  . Hypertension   . Hyperlipidemia   . Situational stress   . Diastolic dysfunction     Grade I per echo in 2/12  . PAF (paroxysmal atrial fibrillation) (HCC)     no recurrence since Feb 2012  . PONV (postoperative nausea and vomiting)   . DVT (deep venous thrombosis) (HCC) 2000's    "both legs in the calves; left went all the way up to my groin" (09/17/2012)  . Pulmonary embolism (HCC) 09/17/2012    "just today; 3 on the right; 2 on the left" (09/17/2012)  . Exertional shortness of breath     "just yesterday" (09/17/2012)  . Hypothyroidism     "wiped out from taking amiodarone" (09/17/2012)  . Obesity (BMI 30-39.9) 07/17/2014    Past Surgical History  Procedure Laterality Date  . Cardiovascular stress test  07/17/2009    EF 59%  . Transthoracic echocardiogram  03/05/2010    EF 60-65%  . Shoulder surgery      left  . Inguinal hernia repair Right 1992  . Inguinal hernia repair Left 2013  . Distal biceps tendon repair Right 2011  .  Shoulder arthroscopy w/ rotator cuff repair Left 09/04/2012  . Cardioversion       Current Outpatient Prescriptions  Medication Sig Dispense Refill  . ALPRAZolam (XANAX) 0.25 MG tablet Take 0.25 mg by mouth 2 (two) times daily.    Marland Kitchen amLODipine (NORVASC) 5 MG tablet Take 1 tablet (5 mg total) by mouth daily. 90 tablet 3  . ANDROGEL PUMP 20.25 MG/ACT (1.62%) GEL Apply topically every morning. Use 2 pumps topically once daily in the morning  4  . ezetimibe (ZETIA) 10 MG tablet Take 1 tablet (10 mg total) by mouth daily. 90 tablet 3  . furosemide (LASIX) 20 MG  tablet Take 1 tablet (20 mg total) by mouth daily as needed for fluid. 90 tablet 3  . lisinopril (PRINIVIL,ZESTRIL) 20 MG tablet Take 20 mg by mouth 2 (two) times daily.    . metoprolol succinate (TOPROL-XL) 25 MG 24 hr tablet TAKE ONE TABLET BY MOUTH EVERY MORNING 90 tablet 3  . Multiple Vitamin (MULTIVITAMIN) tablet Take 1 tablet by mouth daily.      Marland Kitchen omega-3 acid ethyl esters (LOVAZA) 1 G capsule Take 2 capsules (2 g total) by mouth 2 (two) times daily. 360 capsule 3  . omeprazole (PRILOSEC) 20 MG capsule Take 1 capsule (20 mg total) by mouth daily. 30 capsule 0  . propafenone (RYTHMOL SR) 325 MG 12 hr capsule Take 1 capsule (325 mg total) by mouth 2 (two) times daily. 180 capsule 3  . rivaroxaban (XARELTO) 20 MG TABS tablet Take 1 tablet (20 mg total) by mouth daily with supper. 90 tablet 3  . SYNTHROID 150 MCG tablet Take 300 mcg by mouth daily.   0   No current facility-administered medications for this visit.    Allergies:   Crestor and Lipitor    Social History:  The patient  reports that he has never smoked. He has never used smokeless tobacco. He reports that he drinks about 4.8 oz of alcohol per week. He reports that he does not use illicit drugs.   Family History:  The patient's family history includes Heart attack in his maternal grandfather; Hyperlipidemia in his mother; Hypertension in his mother.    ROS:  Please see the history of present illness.   Otherwise, review of systems are positive for none.   All other systems are reviewed and negative.    PHYSICAL EXAM: VS:  BP 128/80 mmHg  Pulse 64  Ht  (1.905 m)  Wt 259 lb 12.8 oz (117.845 kg)  BMI 32.47 kg/m2 , BMI Body mass index is 32.47 kg/(m^2). GEN: Well nourished, well developed, in no acute distress HEENT: normal Neck: no JVD, carotid bruits, or masses Cardiac: RRR; no murmurs, rubs, or gallops,no edema  Respiratory:  clear to auscultation bilaterally, normal work of breathing GI: soft, nontender,  nondistended, + BS MS: no deformity or atrophy Skin: warm and dry, no rash Neuro:  Strength and sensation are intact Psych: euthymic mood, full affect   EKG:  EKG is ordered today. The ekg ordered today demonstrates normal sinus rhythm.  Within normal limits.   Recent Labs: 11/29/2013: TSH 3.70 03/18/2014: BUN 17; Creatinine, Ser 1.47*; Potassium 4.6; Sodium 138 04/04/2014: ALT 30; Hemoglobin 16.7; Platelets 189.0    Lipid Panel    Component Value Date/Time   CHOL 189 04/04/2014 0802   CHOL 206* 05/10/2013 1037   TRIG 116.0 04/04/2014 0802   HDL 40.60 04/04/2014 0802   HDL 47 05/10/2013 1037   CHOLHDL 5 04/04/2014 0802  CHOLHDL 4.4 05/10/2013 1037   VLDL 23.2 04/04/2014 0802   LDLCALC 125* 04/04/2014 0802   LDLCALC 147* 05/10/2013 1037      Wt Readings from Last 3 Encounters:  11/21/14 259 lb 12.8 oz (117.845 kg)  07/18/14 253 lb (114.76 kg)  05/01/14 250 lb (113.399 kg)        ASSESSMENT AND PLAN:  1. Hypercoagulable syndrome secondary to circulating lupus anticoagulant. Past history of pulmonary emboli and past history of DVTs. He is on lifelong anticoagulation with Xarelto. 2. Paroxysmal atrial fibrillation, currently maintaining normal sinus rhythm 3. Hypercholesterolemia 4. Essential hypertension 5. Hypothyroidism [place] [place] 6. History of Lyme disease previously treated at a out-of-state Lyme disease Clinic 7. Osteoarthritis of right hip.  Right hip replacement scheduled for 11/26/14   Current medicines are reviewed at length with the patient today.  The patient does not have concerns regarding medicines.  The following changes have been made:  no change  Labs/ tests ordered today include:   Orders Placed This Encounter  Procedures  . EKG 12-Lead     Disposition: The patient is cleared for orthopedic surgery.  He will leave off his Xarelto one day prior to surgery. Return in 4 months for follow-up office visit lipid panel  hepatic function panel basal metabolic panel CBC, TSH and free T4  Signed, Cassell Clementhomas Sabriah Hobbins MD 11/21/2014 10:47 AM    [facility] [address], [place], [place]  [address] Phone: [phone]) [phone]; Fax: [phone]) [phone]

## 2014-11-21 NOTE — Progress Notes (Addendum)
OV note per Dr Patty SermonsBrackbill placed on chart 11/21/2014 Spoke with Dr Randa EvensEdwards / anesthesia in regards to pts H&P. No orders given. Anesthesia to see pt day of surgery.  EKG epic 03/18/2014 Stress Test epic 02/06/2012 ECHO epic 09/18/2012 CXR 03/18/2014 epic

## 2014-11-21 NOTE — Progress Notes (Addendum)
Clearance note per chart per Dr Patty SermonsBrackbill 11/21/2014  Notified Wendy with Dr Lequita HaltAluisio in regards to pts H&P - Dr Yevonne PaxBrackbill's OV note per chart 11/21/2014 with specifics highlighted

## 2014-11-25 NOTE — H&P (Signed)
TOTAL HIP ADMISSION H&P  Patient is admitted for right total hip arthroplasty.  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  Onset of symptoms was gradual starting >10 years ago with gradually worsening course since that time.The patient noted no past surgery on the right hip(s).  Patient currently rates pain in the right hip at 8 out of 10 with activity. Patient has night pain, worsening of pain with activity and weight bearing, pain that interfers with activities of daily living, pain with passive range of motion and crepitus. Patient has evidence of subchondral cysts, subchondral sclerosis, periarticular osteophytes and joint space narrowing by imaging studies. This condition presents safety issues increasing the risk of falls.  There is no current active infection.  Patient Active Problem List   Diagnosis Date Noted  . Obesity (BMI 30-39.9) 07/17/2014  . OSA (obstructive sleep apnea) 05/02/2014  . Peripheral edema 11/29/2013  . Dyslipidemia 05/10/2013  . Myalgia 05/10/2013  . Cough 09/28/2012  . PE (pulmonary embolism) 09/17/2012  . Phlebitis of leg, superficial 08/10/2012  . Chest pain 01/27/2012  . Hypothyroidism 12/17/2010  . HTN (hypertension) 10/01/2010  . PAF (paroxysmal atrial fibrillation) (HCC) 10/01/2010  . High risk medication use 10/01/2010   Past Medical History  Diagnosis Date  . Hypertension   . Hyperlipidemia   . Situational stress   . Diastolic dysfunction     Grade I per echo in 2/12  . PAF (paroxysmal atrial fibrillation) (HCC)     no recurrence since Feb 2012  . PONV (postoperative nausea and vomiting)   . DVT (deep  venous thrombosis) (HCC) 2000's    "both legs in the calves; left went all the way up to my groin" (09/17/2012)  . Pulmonary embolism (HCC) 09/17/2012    "just today; 3 on the right; 2 on the left" (09/17/2012)  . Hypothyroidism     "wiped out from taking amiodarone" (09/17/2012)  . Obesity (BMI 30-39.9) 07/17/2014  . Dysrhythmia   . Sleep apnea     uses CPAP  . Exertional shortness of breath     "just yesterday" (09/17/2012); pt denies any current issues with SOB   . History of bronchitis   . GERD (gastroesophageal reflux disease)   . Blood dyscrasia     lupus anticoagulant  . Lyme disease     history of   . Hypercoagulation syndrome (HCC)     secondary to circulating lupus anticoagulant    Past Surgical History  Procedure Laterality Date  . Cardiovascular stress test  07/17/2009    EF 59%  . Transthoracic echocardiogram  03/05/2010    EF 60-65%  . Shoulder surgery      left  . Inguinal hernia repair Right 1992  . Inguinal hernia repair Left 2013  . Distal biceps tendon repair Right 2011  . Shoulder arthroscopy w/ rotator cuff repair Left 09/04/2012  . Cardioversion    . Wisdom tooth extraction      lower 2    Current outpatient prescriptions:  .  amLODipine (NORVASC) 5 MG tablet, Take 1 tablet (5 mg total) by mouth daily., Disp: 90 tablet, Rfl: 3 .  ezetimibe (ZETIA) 10 MG tablet, Take 1 tablet (10 mg total) by mouth  daily. (Patient taking differently: Take 10 mg by mouth at bedtime. ), Disp: 90 tablet, Rfl: 3 .  furosemide (LASIX) 20 MG tablet, Take 1 tablet (20 mg total) by mouth daily as needed for fluid., Disp: 90 tablet, Rfl: 3 .  metoprolol succinate (TOPROL-XL) 25 MG 24 hr tablet, TAKE ONE TABLET BY MOUTH EVERY MORNING, Disp: 90 tablet, Rfl: 3 .  Multiple Vitamin (MULTIVITAMIN) tablet, Take 1 tablet by mouth daily.  , Disp: , Rfl:  .  omega-3 acid ethyl esters (LOVAZA) 1 G capsule, Take 2 capsules (2 g total) by mouth 2 (two) times daily., Disp: 360 capsule, Rfl: 3 .   omeprazole (PRILOSEC) 20 MG capsule, Take 1 capsule (20 mg total) by mouth daily., Disp: 30 capsule, Rfl: 0 .  propafenone (RYTHMOL SR) 325 MG 12 hr capsule, Take 1 capsule (325 mg total) by mouth 2 (two) times daily., Disp: 180 capsule, Rfl: 3 .  rivaroxaban (XARELTO) 20 MG TABS tablet, Take 1 tablet (20 mg total) by mouth daily with supper., Disp: 90 tablet, Rfl: 3 .  SYNTHROID 150 MCG tablet, Take 300 mcg by mouth daily. , Disp: , Rfl: 0 .  ALPRAZolam (XANAX) 0.25 MG tablet, Take 0.25 mg by mouth 2 (two) times daily., Disp: , Rfl:  .  ANDROGEL PUMP 20.25 MG/ACT (1.62%) GEL, Apply topically every morning. Use 2 pumps topically once daily in the morning, Disp: , Rfl: 4 .  lisinopril (PRINIVIL,ZESTRIL) 20 MG tablet, Take 20 mg by mouth 2 (two) times daily., Disp: , Rfl:   Allergies  Allergen Reactions  . Crestor [Rosuvastatin Calcium] Other (See Comments)    hepatitis  . Lipitor [Atorvastatin Calcium] Other (See Comments)    Feels like someone hit him with 2x4    Social History  Substance Use Topics  . Smoking status: Never Smoker   . Smokeless tobacco: Never Used  . Alcohol Use: 4.8 oz/week    4 Cans of beer, 4 Shots of liquor per week     Comment: 09/17/2012 "1-2 beers and 1-2 tequila on Fri and Sat nights"    Family History  Problem Relation Age of Onset  . Hypertension Mother   . Hyperlipidemia Mother   . Heart attack Maternal Grandfather      Review of Systems  Constitutional: Positive for malaise/fatigue. Negative for fever, chills, weight loss and diaphoresis.  HENT: Negative.   Eyes: Negative.   Respiratory: Negative.   Cardiovascular: Negative.   Gastrointestinal: Negative.   Genitourinary: Positive for frequency. Negative for dysuria, urgency, hematuria and flank pain.  Musculoskeletal: Positive for joint pain. Negative for myalgias, back pain, falls and neck pain.       Right hip pain  Skin: Negative.   Neurological: Negative.  Negative for weakness.   Endo/Heme/Allergies: Negative.   Psychiatric/Behavioral: Negative.     Objective:  Physical Exam  Constitutional: He is oriented to person, place, and time. He appears well-developed and well-nourished. No distress.  HENT:  Head: Normocephalic and atraumatic.  Right Ear: External ear normal.  Left Ear: External ear normal.  Nose: Nose normal.  Mouth/Throat: Oropharynx is clear and moist.  Eyes: Conjunctivae and EOM are normal.  Neck: Normal range of motion. Neck supple.  Cardiovascular: Normal rate, regular rhythm, normal heart sounds and intact distal pulses.   No murmur heard. Respiratory: Effort normal and breath sounds normal. No respiratory distress. He has no wheezes.  GI: Soft. Bowel sounds are normal. He exhibits no distension. There is no tenderness.  Musculoskeletal:  Right hip: He exhibits decreased range of motion, decreased strength and crepitus.       Left hip: Normal.       Right knee: Normal.       Left knee: Normal.  Neurological: He is alert and oriented to person, place, and time. He has normal strength and normal reflexes. No sensory deficit.  Skin: No rash noted. He is not diaphoretic. No erythema.  Psychiatric: He has a normal mood and affect. His behavior is normal.    Vitals  Weight: 255 lb Height: 75in Body Surface Area: 2.43 m Body Mass Index: 31.87 kg/m  Pulse: 64 (Regular)  BP: 148/94 (Sitting, Left Arm, Standard)  Imaging Review Plain radiographs demonstrate severe degenerative joint disease of the right hip(s). The bone quality appears to be good for age and reported activity level.  Assessment/Plan:  End stage primary osteoarthritis, right hip(s)  The patient history, physical examination, clinical judgement of the provider and imaging studies are consistent with end stage degenerative joint disease of the right hip(s) and total hip arthroplasty is deemed medically necessary. The treatment options including medical  management, injection therapy, arthroscopy and arthroplasty were discussed at length. The risks and benefits of total hip arthroplasty were presented and reviewed. The risks due to aseptic loosening, infection, stiffness, dislocation/subluxation,  thromboembolic complications and other imponderables were discussed.  The patient acknowledged the explanation, agreed to proceed with the plan and consent was signed. Patient is being admitted for inpatient treatment for surgery, pain control, PT, OT, prophylactic antibiotics, VTE prophylaxis, progressive ambulation and ADL's and discharge planning.The patient is planning to be discharged home with home health services   PCP: Dr. Adrian Prince Cardio: Dr. Patty Sermons  IV in left arm Usually needs scopolamine patch preop   Dimitri Ped, PA-C

## 2014-11-26 ENCOUNTER — Encounter (HOSPITAL_COMMUNITY)
Admission: RE | Disposition: A | Discharge status: Home Health | Payer: Self-pay | Source: Ambulatory Visit | Attending: Orthopedic Surgery

## 2014-11-26 ENCOUNTER — Inpatient Hospital Stay (HOSPITAL_COMMUNITY): Payer: BLUE CROSS/BLUE SHIELD | Admitting: Certified Registered Nurse Anesthetist

## 2014-11-26 ENCOUNTER — Inpatient Hospital Stay (HOSPITAL_COMMUNITY): Payer: BLUE CROSS/BLUE SHIELD

## 2014-11-26 ENCOUNTER — Encounter (HOSPITAL_COMMUNITY): Payer: Self-pay | Admitting: *Deleted

## 2014-11-26 ENCOUNTER — Inpatient Hospital Stay (HOSPITAL_COMMUNITY)
Admission: RE | Admit: 2014-11-26 | Discharge: 2014-11-27 | DRG: 470 | Disposition: A | Discharge status: Home Health | Payer: BLUE CROSS/BLUE SHIELD | Source: Ambulatory Visit | Attending: Orthopedic Surgery | Admitting: Orthopedic Surgery

## 2014-11-26 DIAGNOSIS — G4733 Obstructive sleep apnea (adult) (pediatric): Secondary | ICD-10-CM | POA: Diagnosis present

## 2014-11-26 DIAGNOSIS — Z96649 Presence of unspecified artificial hip joint: Secondary | ICD-10-CM

## 2014-11-26 DIAGNOSIS — K219 Gastro-esophageal reflux disease without esophagitis: Secondary | ICD-10-CM | POA: Diagnosis present

## 2014-11-26 DIAGNOSIS — M1611 Unilateral primary osteoarthritis, right hip: Secondary | ICD-10-CM

## 2014-11-26 DIAGNOSIS — E039 Hypothyroidism, unspecified: Secondary | ICD-10-CM | POA: Diagnosis present

## 2014-11-26 DIAGNOSIS — Z79899 Other long term (current) drug therapy: Secondary | ICD-10-CM | POA: Diagnosis not present

## 2014-11-26 DIAGNOSIS — Z01812 Encounter for preprocedural laboratory examination: Secondary | ICD-10-CM

## 2014-11-26 DIAGNOSIS — E669 Obesity, unspecified: Secondary | ICD-10-CM | POA: Diagnosis present

## 2014-11-26 DIAGNOSIS — M169 Osteoarthritis of hip, unspecified: Secondary | ICD-10-CM | POA: Diagnosis present

## 2014-11-26 DIAGNOSIS — Z6832 Body mass index (BMI) 32.0-32.9, adult: Secondary | ICD-10-CM | POA: Diagnosis not present

## 2014-11-26 DIAGNOSIS — I10 Essential (primary) hypertension: Secondary | ICD-10-CM | POA: Diagnosis present

## 2014-11-26 DIAGNOSIS — E785 Hyperlipidemia, unspecified: Secondary | ICD-10-CM | POA: Diagnosis present

## 2014-11-26 DIAGNOSIS — Z8672 Personal history of thrombophlebitis: Secondary | ICD-10-CM | POA: Diagnosis not present

## 2014-11-26 DIAGNOSIS — Z86711 Personal history of pulmonary embolism: Secondary | ICD-10-CM

## 2014-11-26 DIAGNOSIS — M25551 Pain in right hip: Secondary | ICD-10-CM | POA: Diagnosis present

## 2014-11-26 HISTORY — PX: TOTAL HIP ARTHROPLASTY: SHX124

## 2014-11-26 LAB — TYPE AND SCREEN
ABO/RH(D): O POS
Antibody Screen: NEGATIVE

## 2014-11-26 SURGERY — ARTHROPLASTY, HIP, TOTAL, ANTERIOR APPROACH
Anesthesia: General | Site: Hip | Laterality: Right

## 2014-11-26 MED ORDER — METOCLOPRAMIDE HCL 10 MG PO TABS: 5.0000 mg | ORAL_TABLET | Freq: Three times a day (TID) | ORAL | Status: DC | PRN

## 2014-11-26 MED ORDER — ONDANSETRON HCL 4 MG/2ML IJ SOLN
4.0000 mg | Freq: Four times a day (QID) | INTRAMUSCULAR | Status: DC | PRN
  Administered 2014-11-26: 4 mg via INTRAVENOUS
  Filled 2014-11-26: qty 2

## 2014-11-26 MED ORDER — RIVAROXABAN 10 MG PO TABS
10.0000 mg | ORAL_TABLET | ORAL | Status: DC
  Administered 2014-11-26: 10 mg via ORAL
  Filled 2014-11-26 (×3): qty 1

## 2014-11-26 MED ORDER — HYDROMORPHONE HCL 1 MG/ML IJ SOLN
0.2500 mg | INTRAMUSCULAR | Status: DC | PRN
  Administered 2014-11-26 (×2): 0.25 mg via INTRAVENOUS

## 2014-11-26 MED ORDER — MORPHINE SULFATE (PF) 2 MG/ML IV SOLN
1.0000 mg | INTRAVENOUS | Status: DC | PRN
  Administered 2014-11-26: 1 mg via INTRAVENOUS
  Filled 2014-11-26: qty 1

## 2014-11-26 MED ORDER — ROCURONIUM BROMIDE 100 MG/10ML IV SOLN
INTRAVENOUS | Status: AC
  Filled 2014-11-26: qty 1

## 2014-11-26 MED ORDER — NEOSTIGMINE METHYLSULFATE 10 MG/10ML IV SOLN
INTRAVENOUS | Status: DC | PRN
  Administered 2014-11-26: 4 mg via INTRAVENOUS

## 2014-11-26 MED ORDER — ACETAMINOPHEN 650 MG RE SUPP: 650.0000 mg | Freq: Four times a day (QID) | RECTAL | Status: DC | PRN

## 2014-11-26 MED ORDER — METHOCARBAMOL 500 MG PO TABS: 500.0000 mg | ORAL_TABLET | Freq: Four times a day (QID) | ORAL | Status: DC | PRN

## 2014-11-26 MED ORDER — MIDAZOLAM HCL 2 MG/2ML IJ SOLN
INTRAMUSCULAR | Status: AC
  Filled 2014-11-26: qty 4

## 2014-11-26 MED ORDER — PROMETHAZINE HCL 25 MG/ML IJ SOLN: 6.2500 mg | INTRAMUSCULAR | Status: DC | PRN

## 2014-11-26 MED ORDER — ACETAMINOPHEN 500 MG PO TABS
1000.0000 mg | ORAL_TABLET | Freq: Four times a day (QID) | ORAL | Status: AC
  Administered 2014-11-26 – 2014-11-27 (×4): 1000 mg via ORAL
  Filled 2014-11-26 (×3): qty 2

## 2014-11-26 MED ORDER — LIDOCAINE HCL (CARDIAC) 20 MG/ML IV SOLN
INTRAVENOUS | Status: AC
  Filled 2014-11-26: qty 5

## 2014-11-26 MED ORDER — ACETAMINOPHEN 10 MG/ML IV SOLN
INTRAVENOUS | Status: AC
  Filled 2014-11-26: qty 100

## 2014-11-26 MED ORDER — GLYCOPYRROLATE 0.2 MG/ML IJ SOLN
INTRAMUSCULAR | Status: AC
  Filled 2014-11-26: qty 3

## 2014-11-26 MED ORDER — DEXAMETHASONE SODIUM PHOSPHATE 10 MG/ML IJ SOLN
10.0000 mg | Freq: Once | INTRAMUSCULAR | Status: AC
  Administered 2014-11-27: 10 mg via INTRAVENOUS
  Filled 2014-11-26: qty 1

## 2014-11-26 MED ORDER — DEXAMETHASONE SODIUM PHOSPHATE 10 MG/ML IJ SOLN
10.0000 mg | Freq: Once | INTRAMUSCULAR | Status: AC
  Administered 2014-11-26: 10 mg via INTRAVENOUS

## 2014-11-26 MED ORDER — CEFAZOLIN SODIUM-DEXTROSE 2-3 GM-% IV SOLR
2.0000 g | Freq: Four times a day (QID) | INTRAVENOUS | Status: AC
  Administered 2014-11-26 (×2): 2 g via INTRAVENOUS
  Filled 2014-11-26 (×2): qty 50

## 2014-11-26 MED ORDER — METHOCARBAMOL 1000 MG/10ML IJ SOLN
500.0000 mg | Freq: Four times a day (QID) | INTRAVENOUS | Status: DC | PRN
  Filled 2014-11-26: qty 5

## 2014-11-26 MED ORDER — PROPOFOL 10 MG/ML IV BOLUS
INTRAVENOUS | Status: DC | PRN
  Administered 2014-11-26: 200 mg via INTRAVENOUS

## 2014-11-26 MED ORDER — OXYCODONE HCL 5 MG/5ML PO SOLN
5.0000 mg | Freq: Once | ORAL | Status: DC | PRN
  Filled 2014-11-26: qty 5

## 2014-11-26 MED ORDER — POLYETHYLENE GLYCOL 3350 17 G PO PACK: 17.0000 g | PACK | Freq: Every day | ORAL | Status: DC | PRN

## 2014-11-26 MED ORDER — DEXTROSE-NACL 5-0.9 % IV SOLN
INTRAVENOUS | Status: DC
  Administered 2014-11-26: 23:00:00 via INTRAVENOUS

## 2014-11-26 MED ORDER — FUROSEMIDE 20 MG PO TABS
20.0000 mg | ORAL_TABLET | Freq: Every day | ORAL | Status: DC | PRN
  Filled 2014-11-26: qty 1

## 2014-11-26 MED ORDER — HYDROMORPHONE HCL 1 MG/ML IJ SOLN
INTRAMUSCULAR | Status: DC | PRN
  Administered 2014-11-26: 0.5 mg via INTRAVENOUS
  Administered 2014-11-26: 1 mg via INTRAVENOUS
  Administered 2014-11-26: 0.5 mg via INTRAVENOUS

## 2014-11-26 MED ORDER — FLEET ENEMA 7-19 GM/118ML RE ENEM: 1.0000 | ENEMA | Freq: Once | RECTAL | Status: DC | PRN

## 2014-11-26 MED ORDER — AMLODIPINE BESYLATE 5 MG PO TABS
5.0000 mg | ORAL_TABLET | Freq: Every day | ORAL | Status: DC
  Administered 2014-11-27: 5 mg via ORAL
  Filled 2014-11-26 (×4): qty 1

## 2014-11-26 MED ORDER — OXYCODONE HCL 5 MG PO TABS: 5.0000 mg | ORAL_TABLET | Freq: Once | ORAL | Status: DC | PRN

## 2014-11-26 MED ORDER — DOCUSATE SODIUM 100 MG PO CAPS
100.0000 mg | ORAL_CAPSULE | Freq: Two times a day (BID) | ORAL | Status: DC
  Administered 2014-11-26 – 2014-11-27 (×2): 100 mg via ORAL

## 2014-11-26 MED ORDER — EPHEDRINE SULFATE 50 MG/ML IJ SOLN
INTRAMUSCULAR | Status: DC | PRN
  Administered 2014-11-26 (×3): 10 mg via INTRAVENOUS
  Administered 2014-11-26 (×2): 5 mg via INTRAVENOUS

## 2014-11-26 MED ORDER — SCOPOLAMINE 1 MG/3DAYS TD PT72
1.0000 | MEDICATED_PATCH | Freq: Once | TRANSDERMAL | Status: DC
  Administered 2014-11-26: 1.5 mg via TRANSDERMAL
  Filled 2014-11-26: qty 1

## 2014-11-26 MED ORDER — ALPRAZOLAM 0.25 MG PO TABS
0.2500 mg | ORAL_TABLET | Freq: Two times a day (BID) | ORAL | Status: DC
  Administered 2014-11-26 – 2014-11-27 (×2): 0.25 mg via ORAL
  Filled 2014-11-26 (×2): qty 1

## 2014-11-26 MED ORDER — FENTANYL CITRATE (PF) 100 MCG/2ML IJ SOLN
INTRAMUSCULAR | Status: AC
  Filled 2014-11-26: qty 4

## 2014-11-26 MED ORDER — SODIUM CHLORIDE 0.9 % IV SOLN: INTRAVENOUS | Status: DC

## 2014-11-26 MED ORDER — ACETAMINOPHEN 325 MG PO TABS: 650.0000 mg | ORAL_TABLET | Freq: Four times a day (QID) | ORAL | Status: DC | PRN

## 2014-11-26 MED ORDER — BISACODYL 10 MG RE SUPP: 10.0000 mg | Freq: Every day | RECTAL | Status: DC | PRN

## 2014-11-26 MED ORDER — KETOROLAC TROMETHAMINE 30 MG/ML IJ SOLN
INTRAMUSCULAR | Status: AC
  Filled 2014-11-26: qty 1

## 2014-11-26 MED ORDER — BUPIVACAINE HCL (PF) 0.25 % IJ SOLN
INTRAMUSCULAR | Status: DC | PRN
  Administered 2014-11-26: 30 mL

## 2014-11-26 MED ORDER — PROPOFOL 10 MG/ML IV BOLUS
INTRAVENOUS | Status: AC
  Filled 2014-11-26: qty 20

## 2014-11-26 MED ORDER — METOPROLOL SUCCINATE ER 25 MG PO TB24
25.0000 mg | ORAL_TABLET | Freq: Every day | ORAL | Status: DC
  Administered 2014-11-27: 25 mg via ORAL
  Filled 2014-11-26 (×4): qty 1

## 2014-11-26 MED ORDER — ACETAMINOPHEN 10 MG/ML IV SOLN
1000.0000 mg | Freq: Once | INTRAVENOUS | Status: AC
  Administered 2014-11-26: 1000 mg via INTRAVENOUS

## 2014-11-26 MED ORDER — OXYCODONE HCL 5 MG PO TABS: 5.0000 mg | ORAL_TABLET | ORAL | Status: DC | PRN

## 2014-11-26 MED ORDER — CEFAZOLIN SODIUM-DEXTROSE 2-3 GM-% IV SOLR
2.0000 g | INTRAVENOUS | Status: AC
  Administered 2014-11-26: 2 g via INTRAVENOUS

## 2014-11-26 MED ORDER — ONDANSETRON HCL 4 MG/2ML IJ SOLN
INTRAMUSCULAR | Status: AC
  Filled 2014-11-26: qty 2

## 2014-11-26 MED ORDER — SUCCINYLCHOLINE CHLORIDE 20 MG/ML IJ SOLN
INTRAMUSCULAR | Status: DC | PRN
  Administered 2014-11-26: 140 mg via INTRAVENOUS

## 2014-11-26 MED ORDER — 0.9 % SODIUM CHLORIDE (POUR BTL) OPTIME
TOPICAL | Status: DC | PRN
  Administered 2014-11-26: 1000 mL

## 2014-11-26 MED ORDER — FENTANYL CITRATE (PF) 100 MCG/2ML IJ SOLN
INTRAMUSCULAR | Status: DC | PRN
  Administered 2014-11-26 (×6): 50 ug via INTRAVENOUS

## 2014-11-26 MED ORDER — EZETIMIBE 10 MG PO TABS
10.0000 mg | ORAL_TABLET | Freq: Every day | ORAL | Status: DC
  Administered 2014-11-26: 10 mg via ORAL
  Filled 2014-11-26 (×3): qty 1

## 2014-11-26 MED ORDER — ROCURONIUM BROMIDE 100 MG/10ML IV SOLN
INTRAVENOUS | Status: DC | PRN
  Administered 2014-11-26: 40 mg via INTRAVENOUS

## 2014-11-26 MED ORDER — SCOPOLAMINE 1 MG/3DAYS TD PT72
MEDICATED_PATCH | TRANSDERMAL | Status: AC
  Filled 2014-11-26: qty 1

## 2014-11-26 MED ORDER — DIPHENHYDRAMINE HCL 12.5 MG/5ML PO ELIX
12.5000 mg | ORAL_SOLUTION | ORAL | Status: DC | PRN
Start: 2014-11-26 — End: 2014-11-27

## 2014-11-26 MED ORDER — KETOROLAC TROMETHAMINE 30 MG/ML IJ SOLN
30.0000 mg | Freq: Once | INTRAMUSCULAR | Status: AC
  Administered 2014-11-26: 30 mg via INTRAVENOUS

## 2014-11-26 MED ORDER — HYDROMORPHONE HCL 1 MG/ML IJ SOLN
INTRAMUSCULAR | Status: AC
  Filled 2014-11-26: qty 1

## 2014-11-26 MED ORDER — LACTATED RINGERS IV SOLN
INTRAVENOUS | Status: DC
  Administered 2014-11-26: 1000 mL via INTRAVENOUS
  Administered 2014-11-26: 11:00:00 via INTRAVENOUS

## 2014-11-26 MED ORDER — TRAMADOL HCL 50 MG PO TABS: 50.0000 mg | ORAL_TABLET | Freq: Four times a day (QID) | ORAL | Status: DC | PRN

## 2014-11-26 MED ORDER — CHLORHEXIDINE GLUCONATE 4 % EX LIQD: 60.0000 mL | Freq: Once | CUTANEOUS | Status: DC

## 2014-11-26 MED ORDER — GLYCOPYRROLATE 0.2 MG/ML IJ SOLN
INTRAMUSCULAR | Status: DC | PRN
  Administered 2014-11-26: 0.6 mg via INTRAVENOUS

## 2014-11-26 MED ORDER — CEFAZOLIN SODIUM-DEXTROSE 2-3 GM-% IV SOLR
INTRAVENOUS | Status: AC
  Filled 2014-11-26: qty 50

## 2014-11-26 MED ORDER — METOCLOPRAMIDE HCL 5 MG/ML IJ SOLN: 5.0000 mg | Freq: Three times a day (TID) | INTRAMUSCULAR | Status: DC | PRN

## 2014-11-26 MED ORDER — HYDROMORPHONE HCL 2 MG/ML IJ SOLN
INTRAMUSCULAR | Status: AC
  Filled 2014-11-26: qty 1

## 2014-11-26 MED ORDER — FENTANYL CITRATE (PF) 250 MCG/5ML IJ SOLN
INTRAMUSCULAR | Status: AC
  Filled 2014-11-26: qty 25

## 2014-11-26 MED ORDER — TRANEXAMIC ACID 1000 MG/10ML IV SOLN
2000.0000 mg | INTRAVENOUS | Status: DC | PRN
  Administered 2014-11-26: 2000 mg via TOPICAL

## 2014-11-26 MED ORDER — ONDANSETRON HCL 4 MG PO TABS: 4.0000 mg | ORAL_TABLET | Freq: Four times a day (QID) | ORAL | Status: DC | PRN

## 2014-11-26 MED ORDER — PANTOPRAZOLE SODIUM 40 MG PO TBEC
40.0000 mg | DELAYED_RELEASE_TABLET | Freq: Every day | ORAL | Status: DC
  Administered 2014-11-27: 40 mg via ORAL
  Filled 2014-11-26 (×3): qty 1

## 2014-11-26 MED ORDER — PROPAFENONE HCL ER 325 MG PO CP12
325.0000 mg | ORAL_CAPSULE | Freq: Two times a day (BID) | ORAL | Status: DC
  Administered 2014-11-26 – 2014-11-27 (×2): 325 mg via ORAL
  Filled 2014-11-26 (×6): qty 1

## 2014-11-26 MED ORDER — TRANEXAMIC ACID 1000 MG/10ML IV SOLN
2000.0000 mg | Freq: Once | INTRAVENOUS | Status: DC
  Filled 2014-11-26: qty 20

## 2014-11-26 MED ORDER — MENTHOL 3 MG MT LOZG: 1.0000 | LOZENGE | OROMUCOSAL | Status: DC | PRN

## 2014-11-26 MED ORDER — MIDAZOLAM HCL 5 MG/5ML IJ SOLN
INTRAMUSCULAR | Status: DC | PRN
  Administered 2014-11-26: 2 mg via INTRAVENOUS

## 2014-11-26 MED ORDER — LIDOCAINE HCL (CARDIAC) 20 MG/ML IV SOLN
INTRAVENOUS | Status: DC | PRN
  Administered 2014-11-26: 100 mg via INTRAVENOUS

## 2014-11-26 MED ORDER — LEVOTHYROXINE SODIUM 150 MCG PO TABS
300.0000 ug | ORAL_TABLET | Freq: Every day | ORAL | Status: DC
  Administered 2014-11-27: 300 ug via ORAL
  Filled 2014-11-26 (×3): qty 2

## 2014-11-26 MED ORDER — PHENOL 1.4 % MT LIQD: 1.0000 | OROMUCOSAL | Status: DC | PRN

## 2014-11-26 MED ORDER — NEOSTIGMINE METHYLSULFATE 10 MG/10ML IV SOLN
INTRAVENOUS | Status: AC
  Filled 2014-11-26: qty 1

## 2014-11-26 MED ORDER — BUPIVACAINE HCL (PF) 0.25 % IJ SOLN
INTRAMUSCULAR | Status: AC
  Filled 2014-11-26: qty 30

## 2014-11-26 SURGICAL SUPPLY — 34 items
BAG DECANTER FOR FLEXI CONT (MISCELLANEOUS) ×2 IMPLANT
BAG SPEC THK2 15X12 ZIP CLS (MISCELLANEOUS)
BAG ZIPLOCK 12X15 (MISCELLANEOUS) IMPLANT
BLADE SAG 18X100X1.27 (BLADE) ×2 IMPLANT
CAPT HIP TOTAL 2 ×1 IMPLANT
CLOTH BEACON ORANGE TIMEOUT ST (SAFETY) ×2 IMPLANT
COVER PERINEAL POST (MISCELLANEOUS) ×2 IMPLANT
DECANTER SPIKE VIAL GLASS SM (MISCELLANEOUS) ×2 IMPLANT
DRAPE STERI IOBAN 125X83 (DRAPES) ×2 IMPLANT
DRAPE U-SHAPE 47X51 STRL (DRAPES) ×4 IMPLANT
DRSG ADAPTIC 3X8 NADH LF (GAUZE/BANDAGES/DRESSINGS) ×2 IMPLANT
DRSG MEPILEX BORDER 4X4 (GAUZE/BANDAGES/DRESSINGS) ×2 IMPLANT
DRSG MEPILEX BORDER 4X8 (GAUZE/BANDAGES/DRESSINGS) ×2 IMPLANT
DURAPREP 26ML APPLICATOR (WOUND CARE) ×2 IMPLANT
ELECT REM PT RETURN 9FT ADLT (ELECTROSURGICAL) ×2
ELECTRODE REM PT RTRN 9FT ADLT (ELECTROSURGICAL) ×1 IMPLANT
EVACUATOR 1/8 PVC DRAIN (DRAIN) ×2 IMPLANT
GLOVE BIO SURGEON STRL SZ7.5 (GLOVE) ×2 IMPLANT
GLOVE BIO SURGEON STRL SZ8 (GLOVE) ×4 IMPLANT
GLOVE BIOGEL PI IND STRL 8 (GLOVE) ×2 IMPLANT
GLOVE BIOGEL PI INDICATOR 8 (GLOVE) ×2
GOWN STRL REUS W/TWL LRG LVL3 (GOWN DISPOSABLE) ×2 IMPLANT
GOWN STRL REUS W/TWL XL LVL3 (GOWN DISPOSABLE) ×2 IMPLANT
PACK ANTERIOR HIP CUSTOM (KITS) ×2 IMPLANT
STRIP CLOSURE SKIN 1/2X4 (GAUZE/BANDAGES/DRESSINGS) ×2 IMPLANT
SUT ETHIBOND NAB CT1 #1 30IN (SUTURE) ×2 IMPLANT
SUT MNCRL AB 4-0 PS2 18 (SUTURE) ×2 IMPLANT
SUT VIC AB 2-0 CT1 27 (SUTURE) ×4
SUT VIC AB 2-0 CT1 TAPERPNT 27 (SUTURE) ×2 IMPLANT
SUT VLOC 180 0 24IN GS25 (SUTURE) ×2 IMPLANT
SYR 50ML LL SCALE MARK (SYRINGE) IMPLANT
TRAY FOLEY W/METER SILVER 14FR (SET/KITS/TRAYS/PACK) ×2 IMPLANT
TRAY FOLEY W/METER SILVER 16FR (SET/KITS/TRAYS/PACK) ×2 IMPLANT
YANKAUER SUCT BULB TIP 10FT TU (MISCELLANEOUS) ×2 IMPLANT

## 2014-11-26 NOTE — Op Note (Signed)
OPERATIVE REPORT  PREOPERATIVE DIAGNOSIS: Osteoarthritis of the Right hip.   POSTOPERATIVE DIAGNOSIS: Osteoarthritis of the Right  hip.   PROCEDURE: Right total hip arthroplasty, anterior approach.   SURGEON: Ollen GrossFrank Sanya Kobrin, MD   ASSISTANT: Avel Peacerew Perkins, PA-C  ANESTHESIA:  General  ESTIMATED BLOOD LOSS:-550 ml   DRAINS: Hemovac x1.   COMPLICATIONS: None   CONDITION: PACU - hemodynamically stable.   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   PROCEDURE IN DETAIL: After successful administration of spinal  anesthetic, the traction boots [facility] were placed on both  feet and the [facility], boots placed into the leg  holders. The Right hip was then isolated from the perineum with plastic  drapes and prepped and draped in the usual sterile fashion. ASIS and  greater trochanter were marked and a oblique incision was made, starting  at about 1 cm lateral and 2 cm distal to the ASIS and coursing towards  the anterior cortex of the femur. The skin was cut with a 10 blade  through subcutaneous tissue to the level of the fascia overlying the  tensor fascia lata muscle. The fascia was then incised in line with the  incision at the junction of the anterior third and posterior 2/3rd. The  muscle was teased off the fascia and then the interval between the TFL  and the rectus was developed. The Hohmann retractor was then placed at  the top of the femoral neck over the capsule. The vessels overlying the  capsule were cauterized and the fat on top of the capsule was removed.  A Hohmann retractor was then placed anterior underneath the rectus  femoris to give exposure to the entire anterior capsule. A T-shaped  capsulotomy was  performed. The edges were tagged and the femoral head  was identified.       Osteophytes are removed off the superior acetabulum.  The femoral neck was then cut in situ with an oscillating saw. Traction  was then applied to the left lower extremity utilizing the [facility]  traction. The femoral head was then removed. Retractors were placed  around the acetabulum and then circumferential removal of the labrum was  performed. Osteophytes were also removed. Reaming starts at 47 mm to  medialize and  Increased in 2 mm increments to 53 mm. We reamed in  approximately 40 degrees of abduction, 20 degrees anteversion. A 54 mm  pinnacle acetabular shell was then impacted in anatomic position under  fluoroscopic guidance with excellent purchase. We did not need to place  any additional dome screws. A 36 mm neutral + 4 marathon liner was then  placed into the acetabular shell.       The femoral lift was then placed along the lateral aspect of the femur  just distal to the vastus ridge. The leg was  externally rotated and capsule  was stripped off the inferior aspect of the femoral neck down to the  level of the lesser trochanter, this was done with electrocautery. The femur was lifted after this was performed. The  leg was then placed and extended in adducted position to essentially delivering the femur. We also removed the capsule superiorly and the  piriformis from the  piriformis fossa to gain excellent exposure of the  proximal femur. Rongeur was used to remove some cancellous bone to get  into the lateral portion of the proximal femur for placement of the  initial starter reamer. The starter broaches was placed  the starter broach  and was shown to go down the center of the canal. Broaching  with the  Corail system was then performed starting at size 8, coursing  Up to size 12. A size 12 had excellent torsional and rotational  and axial stability. The trial high offset neck was then placed  with a 36 +  5 trial head. The hip was then reduced. We confirmed that  the stem was in the canal both on AP and lateral x-rays. It also has excellent sizing. The hip was reduced with outstanding stability through full extension, full external rotation,  and then flexion in adduction internal rotation. AP pelvis was taken  and the leg lengths were measured and found to be exactly equal. Hip  was then dislocated again and the femoral head and neck removed. The  femoral broach was removed. Size 12 Corail stem with a highoffset  neck was then impacted into the femur following native anteversion. Has  excellent purchase in the canal. Excellent torsional and rotational and  axial stability. It is confirmed to be in the canal on AP and lateral  fluoroscopic views. The 36 + 5 ceramic head was placed and the hip  reduced with outstanding stability. Again AP pelvis was taken and it  confirmed that the leg lengths were equal. The wound was then copiously  irrigated with saline solution and the capsule reattached and repaired  with Ethibond suture. 30 ml of .25% Bupivicaine injected into the capsule and into the edge of the tensor fascia lata as well as subcutaneous tissue. The fascia overlying the tensor fascia lata was  then closed with a running #1 V-Loc. Subcu was closed with interrupted  2-0 Vicryl and subcuticular running 4-0 Monocryl. Incision was cleaned  and dried. Steri-Strips and a bulky sterile dressing applied. Hemovac  drain was hooked to suction and then he was awakened and transported to  recovery in stable condition.        Please note that a surgical assistant was a medical necessity for this procedure to perform it in a safe and expeditious manner. Assistant was necessary to provide appropriate retraction of vital neurovascular structures and to prevent femoral fracture and allow for anatomic placement of the prosthesis.  Ollen Gross, M.D.

## 2014-11-26 NOTE — Transfer of Care (Signed)
Immediate Anesthesia Transfer of Care Note  Patient: Isaiah [name], Isaiah [name]  Procedure(s) Performed: Procedure(s): RIGHT TOTAL HIP ARTHROPLASTY ANTERIOR APPROACH (Right)  Patient Location: PACU  Anesthesia Type:General  Level of Consciousness:  sedated, patient cooperative and responds to stimulation  Airway & Oxygen Therapy:Patient Spontanous Breathing and Patient connected to face mask oxgen  Post-op Assessment:  Report given to PACU RN and Post -op Vital signs reviewed and stable  Post vital signs:  Reviewed and stable  Last Vitals:  Filed Vitals:   11/26/14 0850  BP: 144/91  Pulse: 71  Temp: 36.7 C  Resp: 18    Complications: No apparent anesthesia complications

## 2014-11-26 NOTE — Interval H&P Note (Signed)
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 After consideration of risks, benefits and other options for treatment, the patient has consented to  Procedure(s): RIGHT TOTAL HIP ARTHROPLASTY ANTERIOR APPROACH (Right) as a surgical intervention .  The patient's history has been reviewed, patient examined, no change in status, stable for surgery.  I have reviewed the patient's chart and labs.  Questions were answered to the patient's satisfaction.     Loanne DrillingALUISIO,Jeorge Reister V

## 2014-11-26 NOTE — Anesthesia Procedure Notes (Signed)
Procedure Name: Intubation Date/Time: 11/26/2014 10:51 AM Performed by: Epimenio SarinJARVELA, Isaiah Trompeter R Pre-anesthesia Checklist: Patient identified, Emergency Drugs available, Suction available, Patient being monitored and Timeout performed Patient Re-evaluated:Patient Re-evaluated prior to inductionOxygen Delivery Method: Circle system utilized Preoxygenation: Pre-oxygenation with 100% oxygen Intubation Type: IV induction Ventilation: Mask ventilation without difficulty and Oral airway inserted - appropriate to patient size Laryngoscope Size: Mac and 3 Grade View: Grade I Tube type: Oral Tube size: 7.5 mm Number of attempts: 1 Airway Equipment and Method: Stylet Placement Confirmation: ETT inserted through vocal cords under direct vision,  positive ETCO2 and breath sounds checked- equal and bilateral Secured at: 23 cm Tube secured with: Tape Dental Injury: Teeth and Oropharynx as per pre-operative assessment

## 2014-11-26 NOTE — Anesthesia Preprocedure Evaluation (Addendum)
Anesthesia Evaluation  Patient identified by MRN, date of birth, ID band Patient awake    Reviewed: Allergy & Precautions, NPO status , Patient's Chart, lab work & pertinent test results, reviewed documented beta blocker date and time   History of Anesthesia Complications (+) PONV  Airway Mallampati: I  TM Distance: >3 FB Neck ROM: Full    Dental  (+) Teeth Intact, Dental Advisory Given   Pulmonary shortness of breath, sleep apnea ,    breath sounds clear to auscultation       Cardiovascular hypertension, Pt. on medications and Pt. on home beta blockers + Peripheral Vascular Disease   Rhythm:Regular Rate:Normal     Neuro/Psych negative neurological ROS     GI/Hepatic Neg liver ROS, GERD  ,  Endo/Other  Hypothyroidism   Renal/GU Renal InsufficiencyRenal disease     Musculoskeletal negative musculoskeletal ROS (+)   Abdominal   Peds  Hematology  (+) Blood dyscrasia, , Lupus anticoagulant wit hypercoagulable state requiring lifelong anticoagulation.   Anesthesia Other Findings   Reproductive/Obstetrics                            Lab Results  Component Value Date   WBC 4.5 11/21/2014   HGB 15.8 11/21/2014   HCT 47.0 11/21/2014   MCV 88.0 11/21/2014   PLT 214 11/21/2014   Lab Results  Component Value Date   CREATININE 1.35* 11/21/2014   BUN 21* 11/21/2014   NA 138 11/21/2014   K 4.1 11/21/2014   CL 105 11/21/2014   CO2 28 11/21/2014    Anesthesia Physical Anesthesia Plan  ASA: III  Anesthesia Plan: General   Post-op Pain Management:    Induction: Intravenous  Airway Management Planned: Oral ETT  Additional Equipment:   Intra-op Plan:   Post-operative Plan: Extubation in OR  Informed Consent: I have reviewed the patients History and Physical, chart, labs and discussed the procedure including the risks, benefits and alternatives for the proposed anesthesia with the  patient or authorized representative who has indicated his/her understanding and acceptance.   Dental advisory given  Plan Discussed with: CRNA  Anesthesia Plan Comments:        Anesthesia Quick Evaluation

## 2014-11-26 NOTE — Anesthesia Postprocedure Evaluation (Signed)
  Anesthesia Post-op Note  Patient: Isaiah [name], [name]  Procedure(s) Performed: Procedure(s): RIGHT TOTAL HIP ARTHROPLASTY ANTERIOR APPROACH (Right)  Patient Location: PACU  Anesthesia Type:General  Level of Consciousness: awake and alert   Airway and Oxygen Therapy: Patient Spontanous Breathing  Post-op Pain: mild  Post-op Assessment: Post-op Vital signs reviewed     RLE Motor Response: Purposeful movement RLE Sensation: Full sensation      Post-op Vital Signs: Reviewed  Last Vitals:  Filed Vitals:   11/26/14 1500  BP: 137/97  Pulse: 73  Temp: 36.4 C  Resp: 14    Complications: No apparent anesthesia complications

## 2014-11-26 NOTE — Progress Notes (Signed)
Pt has home CPAP from home and has stated that he will self administer when ready for bed.  RT inspected machine for defects and found none. 2 LPM O2 was bled in through pt's CPAPfor sleep. Pt's CPAP machine is working properly at this time.  Pt to notify RT if any assistance is required throughout the night.  RT to monitor and assess as needed.

## 2014-11-26 NOTE — Evaluation (Signed)
Physical Therapy Evaluation Patient Details Name: Isaiah [name], [name] MRN: [id number] DOB: [birth year] Today's Date: 11/26/2014   History of Present Illness  R THR  Clinical Impression  Pt s/p R THR presents with decreased R LE strength/ROM and post op pain limiting functional mobility.  Pt considering home alone vs short-term SNF dependent on acute stay progress.    Follow Up Recommendations Home health PT;SNF (Dependent on acute stay progress and assist level at home )    Equipment Recommendations  Rolling walker with 5" wheels (Pt requests wide RW)    Recommendations for Other Services OT consult     Precautions / Restrictions Precautions Precautions: Fall Restrictions Weight Bearing Restrictions: No Other Position/Activity Restrictions: WBAT      Mobility  Bed Mobility Overal bed mobility: Needs Assistance Bed Mobility: Supine to Sit;Sit to Supine     Supine to sit: Min assist Sit to supine: Min assist   General bed mobility comments: cues for sequence and use of L LE to self assist  Transfers Overall transfer level: Needs assistance Equipment used: Rolling walker (2 wheeled) Transfers: Sit to/from Stand Sit to Stand: Min assist         General transfer comment: cues for LE management and use of UEs to self assist  Ambulation/Gait Ambulation/Gait assistance: Min assist;Min guard Ambulation Distance (Feet): 123 Feet Assistive device: Rolling walker (2 wheeled) Gait Pattern/deviations: Step-to pattern;Decreased step length - right;Decreased step length - left;Shuffle;Trunk flexed Gait velocity: decr Gait velocity interpretation: Below normal speed for age/gender General Gait Details: cues for posture, position from RW and initial sequence  Stairs            Wheelchair Mobility    Modified Rankin (Stroke Patients Only)       Balance                                             Pertinent Vitals/Pain Pain Assessment:  0-10 Pain Score: 2  Pain Location: R hip Pain Descriptors / Indicators: Aching;Sore Pain Intervention(s): Limited activity within patient's tolerance;Monitored during session;Premedicated before session;Ice applied    Home Living Family/patient expects to be discharged to:: Private residence Living Arrangements: Alone Available Help at Discharge: Available PRN/intermittently Type of Home: House Home Access: Stairs to enter Entrance Stairs-Rails: Right Entrance Stairs-Number of Steps: 4 Home Layout: Able to live on main level with bedroom/bathroom Home Equipment: None      Prior Function Level of Independence: Independent         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     Hand Dominance        Extremity/Trunk Assessment   Upper Extremity Assessment: Overall WFL for tasks assessed           Lower Extremity Assessment: RLE deficits/detail      Cervical / Trunk Assessment: Normal  Communication   Communication: No difficulties  Cognition Arousal/Alertness: Awake/alert Behavior During Therapy: WFL for tasks assessed/performed Overall Cognitive Status: Within Functional Limits for tasks assessed                      General Comments      Exercises        Assessment/Plan    PT Assessment Patient needs continued PT services  PT Diagnosis Difficulty walking   PT Problem List Decreased strength;Decreased range of motion;Decreased activity tolerance;Decreased mobility;Decreased knowledge of use of DME;Pain  PT Treatment Interventions DME instruction;Gait training;Stair training;Functional mobility training;Therapeutic activities;Therapeutic exercise;Patient/family education   PT Goals (Current goals can be found in the Care Plan section) Acute Rehab PT Goals Patient Stated Goal: Resume previous lifestyle with decreased pain PT Goal Formulation: With patient Time For Goal Achievement: 11/29/14 Potential to Achieve Goals: Good    Frequency 7X/week    Barriers to discharge Decreased caregiver support Pt states home alone    Co-evaluation               End of Session Equipment Utilized During Treatment: Gait belt Activity Tolerance: Patient tolerated treatment well Patient left: in bed;with call bell/phone within reach;with family/visitor present Nurse Communication: Mobility status         Time: 1610-9604 PT Time Calculation (min) (ACUTE ONLY): 26 min   Charges:   PT Evaluation $Initial PT Evaluation Tier I: 1 Procedure PT Treatments $Gait Training: 8-22 mins   PT G Codes:        Tashica Provencio 2014-12-19, 5:28 PM

## 2014-11-27 LAB — BASIC METABOLIC PANEL
Anion gap: 5 (ref 5–15)
BUN: 18 mg/dL (ref 6–20)
CALCIUM: 8.8 mg/dL — AB (ref 8.9–10.3)
CO2: 27 mmol/L (ref 22–32)
CREATININE: 1.31 mg/dL — AB (ref 0.61–1.24)
Chloride: 103 mmol/L (ref 101–111)
Glucose, Bld: 177 mg/dL — ABNORMAL HIGH (ref 65–99)
Potassium: 4.2 mmol/L (ref 3.5–5.1)
SODIUM: 135 mmol/L (ref 135–145)

## 2014-11-27 LAB — CBC
HCT: 40.6 % (ref 39.0–52.0)
Hemoglobin: 13.4 g/dL (ref 13.0–17.0)
MCH: 28.6 pg (ref 26.0–34.0)
MCHC: 33 g/dL (ref 30.0–36.0)
MCV: 86.8 fL (ref 78.0–100.0)
PLATELETS: 164 10*3/uL (ref 150–400)
RBC: 4.68 MIL/uL (ref 4.22–5.81)
RDW: 12.9 % (ref 11.5–15.5)
WBC: 8.4 10*3/uL (ref 4.0–10.5)

## 2014-11-27 MED ORDER — ACETAMINOPHEN 325 MG PO TABS: 650.0000 mg | ORAL_TABLET | Freq: Four times a day (QID) | ORAL | Status: DC | PRN

## 2014-11-27 MED ORDER — BISACODYL 10 MG RE SUPP: 10.0000 mg | Freq: Every day | RECTAL | Status: DC | PRN

## 2014-11-27 MED ORDER — METOCLOPRAMIDE HCL 5 MG PO TABS: 5.0000 mg | ORAL_TABLET | Freq: Three times a day (TID) | ORAL | Status: DC | PRN

## 2014-11-27 MED ORDER — FLEET ENEMA 7-19 GM/118ML RE ENEM: 1.0000 | ENEMA | Freq: Once | RECTAL | Status: DC | PRN

## 2014-11-27 MED ORDER — DOCUSATE SODIUM 100 MG PO CAPS: 100.0000 mg | ORAL_CAPSULE | Freq: Two times a day (BID) | ORAL | Status: DC

## 2014-11-27 MED ORDER — OXYCODONE HCL 5 MG PO TABS: 5.0000 mg | ORAL_TABLET | ORAL | Status: DC | PRN

## 2014-11-27 MED ORDER — TRAMADOL HCL 50 MG PO TABS: 50.0000 mg | ORAL_TABLET | Freq: Four times a day (QID) | ORAL | Status: DC | PRN

## 2014-11-27 MED ORDER — ONDANSETRON HCL 4 MG PO TABS: 4.0000 mg | ORAL_TABLET | Freq: Four times a day (QID) | ORAL | Status: DC | PRN

## 2014-11-27 MED ORDER — POLYETHYLENE GLYCOL 3350 17 G PO PACK: 17.0000 g | PACK | Freq: Every day | ORAL | Status: DC | PRN

## 2014-11-27 MED ORDER — METHOCARBAMOL 500 MG PO TABS
500.0000 mg | ORAL_TABLET | Freq: Four times a day (QID) | ORAL | Status: DC | PRN
Start: 2014-11-27 — End: 2015-04-17

## 2014-11-27 NOTE — Progress Notes (Signed)
Subjective: 1 Day Post-Op Procedure(s) (LRB): RIGHT TOTAL HIP ARTHROPLASTY ANTERIOR APPROACH (Right) Patient reports pain as mild.   Patient seen in rounds by Dr. [name].  Sitting up in bed. Patient is well, and has had no acute complaints or problems Patient is ready to go home this afternoon after two sessions.  Objective: Vital signs in last 24 hours: Temp:  [97 F (36.1 C)-98.2 F (36.8 C)] 98.2 F (36.8 C) (11/10 0535) Pulse Rate:  [62-92] 67 (11/10 0535) Resp:  [10-18] 18 (11/10 0535) BP: (118-156)/(69-98) 127/89 mmHg (11/10 0535) SpO2:  [95 %-100 %] 98 % (11/10 0535) Weight:  [117.935 kg (260 lb)-118.105 kg (260 lb 6 oz)] 117.935 kg (260 lb) (11/09 1500)  Intake/Output from previous day:  Intake/Output Summary (Last 24 hours) at 11/27/14 0921 Last data filed at 11/27/14 0600  Gross per 24 hour  Intake 4702.5 ml  Output   1995 ml  Net 2707.5 ml    Labs:  Recent Labs  11/27/14 0530  HGB 13.4    Recent Labs  11/27/14 0530  WBC 8.4  RBC 4.68  HCT 40.6  PLT 164    Recent Labs  11/27/14 0530  NA 135  K 4.2  CL 103  CO2 27  BUN 18  CREATININE 1.31*  GLUCOSE 177*  CALCIUM 8.8*   No results for input(s): LABPT, INR in the last 72 hours.  EXAM: General - Patient is Alert, Appropriate and Oriented Extremity - Neurovascular intact Sensation intact distally Dorsiflexion/Plantar flexion intact Dressing - clean, dry, no drainage Motor Function - intact, moving foot and toes well on exam.  HV pulled.  Assessment/Plan: 1 Day Post-Op Procedure(s) (LRB): RIGHT TOTAL HIP ARTHROPLASTY ANTERIOR APPROACH (Right) Procedure(s) (LRB): RIGHT TOTAL HIP ARTHROPLASTY ANTERIOR APPROACH (Right) Past Medical History  Diagnosis Date  . Hypertension   . Hyperlipidemia   . Situational stress   . Diastolic dysfunction     Grade I per echo in 2/12  . PAF (paroxysmal atrial fibrillation) (HCC)     no recurrence since Feb 2012  . PONV (postoperative nausea and  vomiting)   . DVT (deep venous thrombosis) (HCC) 2000's    "both legs in the calves; left went all the way up to my groin" (09/17/2012)  . Pulmonary embolism (HCC) 09/17/2012    "just today; 3 on the right; 2 on the left" (09/17/2012)  . Hypothyroidism     "wiped out from taking amiodarone" (09/17/2012)  . Obesity (BMI 30-39.9) 07/17/2014  . Dysrhythmia   . Sleep apnea     uses CPAP  . Exertional shortness of breath     "just yesterday" (09/17/2012); pt denies any current issues with SOB   . History of bronchitis   . GERD (gastroesophageal reflux disease)   . Blood dyscrasia     lupus anticoagulant  . Lyme disease     history of   . Hypercoagulation syndrome (HCC)     secondary to circulating lupus anticoagulant   Principal Problem:   OA (osteoarthritis) of hip  Estimated body mass index is 32.06 kg/(m^2) as calculated from the following:   Height as of this encounter: 6' 3.5" (1.918 m).   Weight as of this encounter: 117.935 kg (260 lb). Up with therapy Discharge home with home health Diet - Cardiac diet Follow up - in 2 weeks Activity - WBAT Disposition - Home Condition Upon Discharge - Good D/C Meds - See DC Summary DVT Prophylaxis - Xarelto 20 mg (resuming his home dosing)  Avel Peacerew Perkins, PA-C Orthopaedic Surgery 11/27/2014, 9:21 AM

## 2014-11-27 NOTE — Clinical Social Work Note (Signed)
Clinical Social Work Assessment  Patient Details  Name: Isaiah [name], [name] MRN: [id number] Date of Birth: 1960-06-04  Date of referral:  11/27/14               Reason for consult:  Facility Placement, Discharge Planning                Permission sought to share information with:  Chartered certified accountant granted to share information::  Yes, Verbal Permission Granted  Name::        Agency::     Relationship::     Contact Information:     Housing/Transportation Living arrangements for the past 2 months:  Single Family Home Source of Information:  Patient Patient Interpreter Needed:  None Criminal Activity/Legal Involvement Pertinent to Current Situation/Hospitalization:  No - Comment as needed Significant Relationships:  Significant Other, Parents Lives with:  Self Do you feel safe going back to the place where you live?  No Need for family participation in patient care:  No (Coment)  Care giving concerns:  Pt is concerned he will not be able to manage his care, home alone, following hospital d/c.     Social Worker assessment / plan:  Pt hospitalized on 11/26/14 for pre planned THA. CSW met with pt to assist with d/c planning.  PT is recommended home vs SNF. Pt has made plans to have ST Rehab at [facility] ( [facility] ) at d/c. Pt has discharge orders for today.  Pt has [employer] which requires prior authorization for SNF placement. This process can take up to 72 hrs. Pt has decided to pay out of pocket  for placement at [facility] for 1-3 days. SNF is in agreement with this plan. CSW will assist with d/c planning to SNF today.  Employment status:  Aeronautical engineer:  Managed Care PT Recommendations:   (SNF vs HOME) Information / Referral to community resources:  [place]  Patient/Family's Response to care:  Pt feels ST Rehab is needed.  Patient/Family's Understanding of and Emotional Response to Diagnosis, Current Treatment, and  Prognosis:  Pt is aware of his medical status. Pt is reluctant to return home without 24/7 support. Pt is ambulating 222 ft min guard. Pt is aware that it is unlikely [employer] would authorize ST Rehab. Pt prefers to pay out of pocket at [facility] prior to returning home.  Emotional Assessment Appearance:  Appears stated age Attitude/Demeanor/Rapport:  Other (cooperative) Affect (typically observed):  Pleasant, Calm Orientation:  Oriented to Self, Oriented to Place, Oriented to  Time, Oriented to Situation Alcohol / Substance use:  Alcohol Use Psych involvement (Current and /or in the community):  No (Comment)  Discharge Needs  Concerns to be addressed:  Discharge Planning Concerns Readmission within the last 30 days:  No Current discharge risk:  None Barriers to Discharge:  No Barriers Identified   Loraine Maple  [phone] 11/27/2014, 12:37 PM

## 2014-11-27 NOTE — Care Management Note (Signed)
Case Management Note  Patient Details  Name: Isaiah [name], [name] MRN: [id number] Date of Birth: 1960-11-16  Subjective/Objective:                    Action/Plan:   Expected Discharge Date:                  Expected Discharge Plan:  [place]  In-House Referral:     Discharge planning Services  CM Consult  Post Acute Care Choice:    Choice offered to:     DME Arranged:  Walker rolling DME Agency:  [place]:    [facility] Agency:     Status of Service:  Completed, signed off  Medicare Important Message Given:    Date Medicare IM Given:    Medicare IM give by:    Date Additional Medicare IM Given:    Additional Medicare Important Message give by:     If discussed at [place] of Stay Meetings, dates discussed:    Additional Comments: CM met with pt to confirm plan is for SNF.  CM called [facility] DME rep, Lecretia to please deliver a wide rolling walker to room prior to discharge.  CSW arranging SNF.  No other CM needs were communicated. Dellie Catholic, RN 11/27/2014, 1:40 PM

## 2014-11-27 NOTE — NC FL2 (Signed)
[place] MEDICAID FL2 LEVEL OF CARE SCREENING TOOL     IDENTIFICATION  Patient Name: Isaiah [name], [name] Birthdate: 11/09/1960 Sex: male Admission Date (Current Location): 11/26/2014  [facility] [place]:     Facility and Address:  [facility],  [address], [place] [id number]      Provider Number: [id number]  Attending Physician Name and Address:  Ollen GrossFrank Aluisio, MD  Relative Name and Phone Number:       Current Level of Care: Hospital Recommended Level of Care: Skilled Nursing Facility Prior Approval Number:    Date Approved/Denied:   PASRR Number:    Discharge Plan: SNF    Current Diagnoses: Patient Active Problem List   Diagnosis Date Noted  . OA (osteoarthritis) of hip 11/26/2014  . Obesity (BMI 30-39.9) 07/17/2014  . OSA (obstructive sleep apnea) 05/02/2014  . Peripheral edema 11/29/2013  . Dyslipidemia 05/10/2013  . Myalgia 05/10/2013  . Cough 09/28/2012  . PE (pulmonary embolism) 09/17/2012  . Phlebitis of leg, superficial 08/10/2012  . Chest pain 01/27/2012  . Hypothyroidism 12/17/2010  . HTN (hypertension) 10/01/2010  . PAF (paroxysmal atrial fibrillation) (HCC) 10/01/2010  . High risk medication use 10/01/2010    Orientation ACTIVITIES/SOCIAL BLADDER RESPIRATION    Self, Time, Situation, Place  Active Continent Normal  BEHAVIORAL SYMPTOMS/MOOD NEUROLOGICAL BOWEL NUTRITION STATUS   (no behaviors)   Continent Diet  PHYSICIAN VISITS COMMUNICATION OF NEEDS Height & Weight Skin  Over 180 days Verbally 6\' 3"  (190.5 cm) 260 lbs. Surgical wounds          AMBULATORY STATUS RESPIRATION     (mid guard) Normal      Personal Care Assistance Level of Assistance  Bathing, Dressing Bathing Assistance: Limited assistance   Dressing Assistance: Limited assistance      Functional Limitations Info                SPECIAL CARE FACTORS FREQUENCY  PT (By licensed PT)     PT Frequency: daily             Additional  Factors Info                  Current Medications (11/27/2014): Current Facility-Administered Medications  Medication Dose Route Frequency Provider Last Rate Last Dose  . acetaminophen (TYLENOL) tablet 650 mg  650 mg Oral Q6H PRN Ollen GrossFrank Aluisio, MD       Or  . acetaminophen (TYLENOL) suppository 650 mg  650 mg Rectal Q6H PRN Ollen GrossFrank Aluisio, MD      . acetaminophen (TYLENOL) tablet 1,000 mg  1,000 mg Oral 4 times per day Ollen GrossFrank Aluisio, MD   1,000 mg at 11/27/14 0545  . ALPRAZolam Prudy Feeler(XANAX) tablet 0.25 mg  0.25 mg Oral BID Ollen GrossFrank Aluisio, MD   0.25 mg at 11/27/14 0958  . amLODipine (NORVASC) tablet 5 mg  5 mg Oral Daily Ollen GrossFrank Aluisio, MD   5 mg at 11/27/14 0545  . bisacodyl (DULCOLAX) suppository 10 mg  10 mg Rectal Daily PRN Ollen GrossFrank Aluisio, MD      . dextrose 5 %-0.9 % sodium chloride infusion   Intravenous Continuous Ollen GrossFrank Aluisio, MD   Stopped at 11/27/14 1030  . diphenhydrAMINE (BENADRYL) 12.5 MG/5ML elixir 12.5-25 mg  12.5-25 mg Oral Q4H PRN Ollen GrossFrank Aluisio, MD      . docusate sodium (COLACE) capsule 100 mg  100 mg Oral BID Ollen GrossFrank Aluisio, MD   100 mg at 11/27/14 0546  . ezetimibe (ZETIA) tablet 10 mg  10 mg Oral QHS Ollen Gross, MD   10 mg at 11/26/14 1742  . furosemide (LASIX) tablet 20 mg  20 mg Oral Daily PRN Ollen Gross, MD      . levothyroxine (SYNTHROID, LEVOTHROID) tablet 300 mcg  300 mcg Oral QAC breakfast Ollen Gross, MD   300 mcg at 11/27/14 0545  . menthol-cetylpyridinium (CEPACOL) lozenge 3 mg  1 lozenge Oral PRN Ollen Gross, MD       Or  . phenol (CHLORASEPTIC) mouth spray 1 spray  1 spray Mouth/Throat PRN Ollen Gross, MD      . methocarbamol (ROBAXIN) tablet 500 mg  500 mg Oral Q6H PRN Ollen Gross, MD       Or  . methocarbamol (ROBAXIN) 500 mg in dextrose 5 % 50 mL IVPB  500 mg Intravenous Q6H PRN Ollen Gross, MD      . metoCLOPramide (REGLAN) tablet 5-10 mg  5-10 mg Oral Q8H PRN Ollen Gross, MD       Or  . metoCLOPramide (REGLAN) injection 5-10 mg  5-10 mg  Intravenous Q8H PRN Ollen Gross, MD      . metoprolol succinate (TOPROL-XL) 24 hr tablet 25 mg  25 mg Oral Daily Ollen Gross, MD   25 mg at 11/27/14 0545  . morphine 2 MG/ML injection 1 mg  1 mg Intravenous Q1H PRN Ollen Gross, MD   1 mg at 11/26/14 1620  . ondansetron (ZOFRAN) tablet 4 mg  4 mg Oral Q6H PRN Ollen Gross, MD       Or  . ondansetron [facility]) injection 4 mg  4 mg Intravenous Q6H PRN Ollen Gross, MD   4 mg at 11/26/14 2056  . oxyCODONE (Oxy IR/ROXICODONE) immediate release tablet 5-10 mg  5-10 mg Oral Q3H PRN Ollen Gross, MD      . pantoprazole (PROTONIX) EC tablet 40 mg  40 mg Oral Daily Ollen Gross, MD   40 mg at 11/27/14 0545  . polyethylene glycol (MIRALAX / GLYCOLAX) packet 17 g  17 g Oral Daily PRN Ollen Gross, MD      . propafenone (RYTHMOL SR) 12 hr capsule 325 mg  325 mg Oral BID Ollen Gross, MD   325 mg at 11/27/14 0546  . rivaroxaban (XARELTO) tablet 10 mg  10 mg Oral Q24H Ollen Gross, MD   10 mg at 11/26/14 2019  . sodium phosphate (FLEET) 7-19 GM/118ML enema 1 enema  1 enema Rectal Once PRN Ollen Gross, MD      . traMADol Janean Sark) tablet 50-100 mg  50-100 mg Oral Q6H PRN Ollen Gross, MD       Do not use this list as official medication orders. Please verify with discharge summary.  Discharge Medications:   Medication List    ASK your doctor about these medications        ALPRAZolam 0.25 MG tablet  Commonly known as:  XANAX  Take 0.25 mg by mouth 2 (two) times daily.     amLODipine 5 MG tablet  Commonly known as:  NORVASC  Take 1 tablet (5 mg total) by mouth daily.     ANDROGEL PUMP 20.25 MG/ACT (1.62%) Gel  Generic drug:  Testosterone  Apply topically every morning. Use 2 pumps topically once daily in the morning     ezetimibe 10 MG tablet  Commonly known as:  ZETIA  Take 1 tablet (10 mg total) by mouth daily.     furosemide 20 MG tablet  Commonly known as:  LASIX  Take 1  tablet (20 mg total) by mouth daily as needed for fluid.      lisinopril 20 MG tablet  Commonly known as:  PRINIVIL,ZESTRIL  Take 20 mg by mouth 2 (two) times daily.     metoprolol succinate 25 MG 24 hr tablet  Commonly known as:  TOPROL-XL  TAKE ONE TABLET BY MOUTH EVERY MORNING     multivitamin tablet  Take 1 tablet by mouth daily.     omega-3 acid ethyl esters 1 G capsule  Commonly known as:  LOVAZA  Take 2 capsules (2 g total) by mouth 2 (two) times daily.     omeprazole 20 MG capsule  Commonly known as:  PRILOSEC  Take 1 capsule (20 mg total) by mouth daily.     propafenone 325 MG 12 hr capsule  Commonly known as:  RYTHMOL SR  Take 1 capsule (325 mg total) by mouth 2 (two) times daily.     rivaroxaban 20 MG Tabs tablet  Commonly known as:  XARELTO  Take 1 tablet (20 mg total) by mouth daily with supper.     SYNTHROID 150 MCG tablet  Generic drug:  levothyroxine  Take 300 mcg by mouth daily.        Relevant Imaging Results:  Relevant Lab Results:  Recent Labs    Additional Information SS # [id number]  Zadyn Yardley, Dickey Gave, LCSW

## 2014-11-27 NOTE — Clinical Social Work Placement (Signed)
   CLINICAL SOCIAL WORK PLACEMENT  NOTE  Date:  11/27/2014  Patient Details  Name: Isaiah [name], [name] MRN: [id number] Date of Birth: 1960/11/23  Clinical Social Work is seeking post-discharge placement for this patient at the Skilled  Nursing Facility level of care (*CSW will initial, date and re-position this form in  chart as items are completed):  No   Patient/family provided with [facility] Clinical Social Work Department's list of facilities offering this level of care within the geographic area requested by the patient (or if unable, by the patient's family).  Yes   Patient/family informed of their freedom to choose among providers that offer the needed level of care, that participate in Medicare, Medicaid or managed care program needed by the patient, have an available bed and are willing to accept the patient.  No   Patient/family informed of [place]'s ownership interest in [facility] and [facility], as well as of the fact that they are under no obligation to receive care at these facilities.  PASRR submitted to EDS on 11/27/14     PASRR number received on 11/27/14     Existing PASRR number confirmed on       FL2 transmitted to all facilities in geographic area requested by pt/family on 11/27/14     FL2 transmitted to all facilities within larger geographic area on       Patient informed that his/her managed care company has contracts with or will negotiate with certain facilities, including the following:        Yes   Patient/family informed of bed offers received.  Patient chooses bed at [facility] [place]     Physician recommends and patient chooses bed at      Patient to be transferred to [facility] [place] on  .  Patient to be transferred to facility by CAR     Patient family notified on 11/27/14 of transfer.  Name of family member notified:  Pt contacted family directly.     PHYSICIAN       Additional Comment: Pt is in agreement  with d/c to [facility] today. PT approved transport by car. Pt will pay out of pocket for placement. NSG reviewed d/c summary, scripts, avs. Scripts included in d/c packet. D/C summary sent to SNF for review prior to d/c. D/C packet provided to pt prior to d/c.   _______________________________________________ Royetta AsalHaidinger, [place], LCSW [phone] 11/27/2014, 1:00 PM

## 2014-11-27 NOTE — Discharge Instructions (Signed)
° °Dr. Frank Aluisio °Total Joint Specialist °[facility] °[address] °[place], [place] [address] °[phone] ° °ANTERIOR APPROACH TOTAL HIP REPLACEMENT POSTOPERATIVE DIRECTIONS ° ° °Hip Rehabilitation, Guidelines Following Surgery  °The results of a hip operation are greatly improved after range of motion and muscle strengthening exercises. Follow all safety measures which are given to protect your hip. If any of these exercises cause increased pain or swelling in your joint, decrease the amount until you are comfortable again. Then slowly increase the exercises. Call your caregiver if you have problems or questions.  ° °HOME CARE INSTRUCTIONS  °Remove items at home which could result in a fall. This includes throw rugs or furniture in walking pathways.  °· ICE to the affected hip every three hours for 30 minutes at a time and then as needed for pain and swelling.  Continue to use ice on the hip for pain and swelling from surgery. You may notice swelling that will progress down to the foot and ankle.  This is normal after surgery.  Elevate the leg when you are not up walking on it.   °· Continue to use the breathing machine which will help keep your temperature down.  It is common for your temperature to cycle up and down following surgery, especially at night when you are not up moving around and exerting yourself.  The breathing machine keeps your lungs expanded and your temperature down. ° ° °DIET °You may resume your previous home diet once your are discharged from the hospital. ° °DRESSING / WOUND CARE / SHOWERING °You may shower 3 days after surgery, but keep the wounds dry during showering.  You may use an occlusive plastic wrap (Press'n Seal for example), NO SOAKING/SUBMERGING IN THE BATHTUB.  If the bandage gets wet, change with a clean dry gauze.  If the incision gets wet, pat the wound dry with a clean towel. °You may start showering once you are discharged home but do not  submerge the incision under water. Just pat the incision dry and apply a dry gauze dressing on daily. °Change the surgical dressing daily and reapply a dry dressing each time. ° °ACTIVITY °Walk with your walker as instructed. °Use walker as long as suggested by your caregivers. °Avoid periods of inactivity such as sitting longer than an hour when not asleep. This helps prevent blood clots.  °You may resume a sexual relationship in one month or when given the OK by your doctor.  °You may return to work once you are cleared by your doctor.  °Do not drive a car for 6 weeks or until released by you surgeon.  °Do not drive while taking narcotics. ° °WEIGHT BEARING °Weight bearing as tolerated with assist device (walker, cane, etc) as directed, use it as long as suggested by your surgeon or therapist, typically at least 4-6 weeks. ° °POSTOPERATIVE CONSTIPATION PROTOCOL °Constipation - defined medically as fewer than three stools per week and severe constipation as less than one stool per week. ° °One of the most common issues patients have following surgery is constipation.  Even if you have a regular bowel pattern at home, your normal regimen is likely to be disrupted due to multiple reasons following surgery.  Combination of anesthesia, postoperative narcotics, change in appetite and fluid intake all can affect your bowels.  In order to avoid complications following surgery, here are some recommendations in order to help you during your recovery period. ° °Colace (docusate) - Pick up an over-the-counter   form of Colace or another stool softener and take twice a day as long as you are requiring postoperative pain medications.  Take with a full glass of water daily.  If you experience loose stools or diarrhea, hold the colace until you stool forms back up.  If your symptoms do not get better within 1 week or if they get worse, check with your doctor. ° °Dulcolax (bisacodyl) - Pick up over-the-counter and take as directed  by the product packaging as needed to assist with the movement of your bowels.  Take with a full glass of water.  Use this product as needed if not relieved by Colace only.  ° °MiraLax (polyethylene glycol) - Pick up over-the-counter to have on hand.  MiraLax is a solution that will increase the amount of water in your bowels to assist with bowel movements.  Take as directed and can mix with a glass of water, juice, soda, coffee, or tea.  Take if you go more than two days without a movement. °Do not use MiraLax more than once per day. Call your doctor if you are still constipated or irregular after using this medication for 7 days in a row. ° °If you continue to have problems with postoperative constipation, please contact the office for further assistance and recommendations.  If you experience "the worst abdominal pain ever" or develop nausea or vomiting, please contact the office immediatly for further recommendations for treatment. ° °ITCHING ° If you experience itching with your medications, try taking only a single pain pill, or even half a pain pill at a time.  You can also use Benadryl over the counter for itching or also to help with sleep.  ° °TED HOSE STOCKINGS °Wear the elastic stockings on both legs for three weeks following surgery during the day but you may remove then at night for sleeping. ° °MEDICATIONS °See your medication summary on the “After Visit Summary” that the nursing staff will review with you prior to discharge.  You may have some home medications which will be placed on hold until you complete the course of blood thinner medication.  It is important for you to complete the blood thinner medication as prescribed by your surgeon.  Continue your approved medications as instructed at time of discharge. ° °PRECAUTIONS °If you experience chest pain or shortness of breath - call 911 immediately for transfer to the hospital emergency department.  °If you develop a fever greater that 101 F,  purulent drainage from wound, increased redness or drainage from wound, foul odor from the wound/dressing, or calf pain - CONTACT YOUR SURGEON.   °                                                °FOLLOW-UP APPOINTMENTS °Make sure you keep all of your appointments after your operation with your surgeon and caregivers. You should call the office at the above phone number and make an appointment for approximately two weeks after the date of your surgery or on the date instructed by your surgeon outlined in the "After Visit Summary". ° °RANGE OF MOTION AND STRENGTHENING EXERCISES  °These exercises are designed to help you keep full movement of your hip joint. Follow your caregiver's or physical therapist's instructions. Perform all exercises about fifteen times, three times per day or as directed. Exercise both hips, even if you   have had only one joint replacement. These exercises can be done on a training (exercise) mat, on the floor, on a table or on a bed. Use whatever works the best and is most comfortable for you. Use music or television while you are exercising so that the exercises are a pleasant break in your day. This will make your life better with the exercises acting as a break in routine you can look forward to.  Lying on your back, slowly slide your foot toward your buttocks, raising your knee up off the floor. Then slowly slide your foot back down until your leg is straight again.  Lying on your back spread your legs as far apart as you can without causing discomfort.  Lying on your side, raise your upper leg and foot straight up from the floor as far as is comfortable. Slowly lower the leg and repeat.  Lying on your back, tighten up the muscle in the front of your thigh (quadriceps muscles). You can do this by keeping your leg straight and trying to raise your heel off the floor. This helps strengthen the largest muscle supporting your knee.  Lying on your back, tighten up the muscles of your  buttocks both with the legs straight and with the knee bent at a comfortable angle while keeping your heel on the floor.   IF YOU ARE TRANSFERRED TO A SKILLED REHAB FACILITY If the patient is transferred to a skilled rehab facility following release from the hospital, a list of the current medications will be sent to the facility for the patient to continue.  When discharged from the skilled rehab facility, please have the facility set up the patient's Home Health Physical Therapy prior to being released. Also, the skilled facility will be responsible for providing the patient with their medications at time of release from the facility to include their pain medication, the muscle relaxants, and their blood thinner medication. If the patient is still at the rehab facility at time of the two week follow up appointment, the skilled rehab facility will also need to assist the patient in arranging follow up appointment in our office and any transportation needs.  MAKE SURE YOU:  Understand these instructions.  Get help right away if you are not doing well or get worse.    Pick up stool softner and laxative for home use following surgery while on pain medications. Do not submerge incision under water. Please use good hand washing techniques while changing dressing each day. May shower starting three days after surgery. Please use a clean towel to pat the incision dry following showers. Continue to use ice for pain and swelling after surgery. Do not use any lotions or creams on the incision until instructed by your surgeon.  Resume the Xarelto 20 mg Dosing daily at home.

## 2014-11-27 NOTE — Progress Notes (Signed)
Physical Therapy Treatment Patient Details Name: Isaiah [name], Isaiah [name] MRN: [id number] DOB: Jan 18, 1960 Today's Date: 11/27/2014    History of Present Illness R THR    PT Comments    Pt progressing well with mobility.  Follow Up Recommendations  Home health PT;SNF     Equipment Recommendations  Rolling walker with 5" wheels    Recommendations for Other Services OT consult     Precautions / Restrictions Precautions Precautions: Fall Restrictions Weight Bearing Restrictions: No Other Position/Activity Restrictions: WBAT    Mobility  Bed Mobility Overal bed mobility: Needs Assistance Bed Mobility: Supine to Sit;Sit to Supine     Supine to sit: Supervision Sit to supine: Supervision      Transfers Overall transfer level: Needs assistance Equipment used: Rolling walker (2 wheeled) Transfers: Sit to/from Stand Sit to Stand: Supervision;From elevated surface            Ambulation/Gait Ambulation/Gait assistance: Supervision Ambulation Distance (Feet): 450 Feet Assistive device: Rolling walker (2 wheeled) Gait Pattern/deviations: Step-to pattern;Step-through pattern;Decreased step length - right;Decreased step length - left;Shuffle     General Gait Details: min cues for position from RW    Stairs Stairs: Yes Stairs assistance: Supervision Stair Management: Two rails;Step to pattern;Forwards Number of Stairs: 5 General stair comments: cues for sequence  Wheelchair Mobility    Modified Rankin (Stroke Patients Only)       Balance                                    Cognition Arousal/Alertness: Awake/alert Behavior During Therapy: WFL for tasks assessed/performed Overall Cognitive Status: Within Functional Limits for tasks assessed                      Exercises Total Joint Exercises Ankle Circles/Pumps: AROM;Both;15 reps;Supine Quad Sets: AROM;Both;10 reps;Supine Heel Slides: AAROM;20 reps;Supine;Right Hip  ABduction/ADduction: AAROM;Right;15 reps;Supine    General Comments        Pertinent Vitals/Pain Pain Assessment: 0-10 Pain Score: 2  Pain Location: R hip Pain Descriptors / Indicators: Aching;Sore Pain Intervention(s): Limited activity within patient's tolerance;Monitored during session;Premedicated before session;Ice applied    Home Living                      Prior Function            PT Goals (current goals can now be found in the care plan section) Acute Rehab PT Goals Patient Stated Goal: Resume previous lifestyle with decreased pain PT Goal Formulation: With patient Time For Goal Achievement: 11/29/14 Potential to Achieve Goals: Good Progress towards PT goals: Progressing toward goals    Frequency  7X/week    PT Plan Current plan remains appropriate    Co-evaluation             End of Session Equipment Utilized During Treatment: Gait belt Activity Tolerance: Patient tolerated treatment well Patient left: in bed;with call bell/phone within reach     Time: 1310-1343 PT Time Calculation (min) (ACUTE ONLY): 33 min  Charges:  $Gait Training: 8-22 mins $Therapeutic Exercise: 8-22 mins                    G Codes:      Kal Chait 11/27/2014, 4:21 PM

## 2014-11-27 NOTE — Evaluation (Signed)
Occupational Therapy Evaluation Patient Details Name: France RavensBlake S Boardley, [name] MRN: [id number] DOB: 02/13/1960 Today's Date: 11/27/2014    History of Present Illness R THR   Clinical Impression   Pt is s/p THA resulting in the deficits listed below (see OT Problem List Pt will benefit from skilled OT to increase their safety and independence with ADL and functional mobility for ADL to facilitate discharge to venue listed below.     Follow Up Recommendations  Other (comment) (pt going to SNF for therapy)    Equipment Recommendations  None recommended by OT    Recommendations for Other Services       Precautions / Restrictions Precautions Precautions: Fall Restrictions Weight Bearing Restrictions: No Other Position/Activity Restrictions: WBAT      Mobility Bed Mobility   Bed Mobility: Sit to Supine       Sit to supine: Min assist      Transfers Overall transfer level: Needs assistance Equipment used: Rolling walker (2 wheeled) Transfers: Sit to/from Stand Sit to Stand: Min guard                   ADL Overall ADL's : Needs assistance/impaired         Upper Body Bathing: Set up;Sitting   Lower Body Bathing: Minimal assistance;Sit to/from stand   Upper Body Dressing : Set up   Lower Body Dressing: Moderate assistance;Sit to/from stand;Cueing for safety;Cueing for sequencing   Toilet Transfer: Min guard;RW                             Pertinent Vitals/Pain Pain Score: 2  Pain Location:  r hip Pain Descriptors / Indicators: Sore Pain Intervention(s): Limited activity within patient's tolerance     Hand Dominance     Extremity/Trunk Assessment Upper Extremity Assessment Upper Extremity Assessment: Overall WFL for tasks assessed           Communication Communication Communication: No difficulties   Cognition Arousal/Alertness: Awake/alert Behavior During Therapy: WFL for tasks assessed/performed Overall Cognitive Status:  Within Functional Limits for tasks assessed                                Home Living Family/patient expects to be discharged to:: Private residence Living Arrangements: Alone Available Help at Discharge: Available PRN/intermittently Type of Home: House Home Access: Stairs to enter Secretary/administratorntrance Stairs-Number of Steps: 4 Entrance Stairs-Rails: Right Home Layout: Able to live on main level with bedroom/bathroom               Home Equipment: None          Prior Functioning/Environment Level of Independence: Independent        Comments: Working as Education officer, communitydentist    OT Diagnosis: Generalized weakness   OT Problem List: Decreased strength   OT Treatment/Interventions:      OT Goals(Current goals can be found in the care plan section) Acute Rehab OT Goals Patient Stated Goal: Resume previous lifestyle with decreased pain  OT Frequency:     Barriers to D/C:               End of Session Nurse Communication: Mobility status  Activity Tolerance: Patient tolerated treatment well Patient left: in bed;with call bell/phone within reach   Time: 1040-1058 OT Time Calculation (min): 18 min Charges:  OT General Charges $OT Visit: 1 Procedure OT Evaluation $Initial OT Evaluation Tier  I: 1 Procedure G-Codes:    Alba Cory December 19, 2014, 11:16 AM

## 2014-11-27 NOTE — Discharge Summary (Signed)
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  Admit date: 11/26/2014 Discharge date: 11-27-2014  Primary Diagnosis:  Osteoarthritis of the Right hip.   Admission Diagnoses:  Past Medical History  Diagnosis Date  . Hypertension   . Hyperlipidemia   . Situational stress   . Diastolic dysfunction     Grade I per echo in 2/12  . PAF (paroxysmal atrial fibrillation) ([place])     no recurrence since Feb 2012  . PONV (postoperative nausea and vomiting)   . DVT (deep venous thrombosis) ([place]) 2000's    "both legs in the calves; left went all the way up to my groin" (09/17/2012)  . Pulmonary embolism ([place]) 09/17/2012    "just today; 3 on the right; 2 on the left" (09/17/2012)  . Hypothyroidism     "wiped out from taking amiodarone" (09/17/2012)  . Obesity (BMI 30-39.9) 07/17/2014  . Dysrhythmia   . Sleep apnea     uses CPAP  . Exertional shortness of breath     "just yesterday" (09/17/2012); pt denies any current issues with SOB   . History of bronchitis   . GERD (gastroesophageal reflux disease)   . Blood dyscrasia     lupus anticoagulant  . Lyme disease     history of   . Hypercoagulation syndrome (HCC)     secondary to circulating lupus anticoagulant   Discharge Diagnoses:   Principal Problem:   OA (osteoarthritis) of hip  Estimated body mass index is 32.06 kg/(m^2) as calculated from the following:   Height as of this encounter: 6' 3.5" (1.918 m).   Weight as of this encounter: 117.935 kg (260 lb).  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   Laboratory Data: Admission on 11/26/2014  Component Date Value Ref Range Status  .  WBC 11/27/2014 8.4  4.0 - 10.5 K/uL Final  . RBC 11/27/2014 4.68  4.22 - 5.81 MIL/uL Final  . Hemoglobin 11/27/2014 13.4  13.0 - 17.0 g/dL Final  . HCT 11/27/2014 40.6  39.0 - 52.0 % Final  . MCV 11/27/2014 86.8  78.0 - 100.0 fL Final  . MCH 11/27/2014 28.6  26.0 - 34.0 pg Final  . MCHC 11/27/2014 33.0  30.0 - 36.0 g/dL Final  . RDW 11/27/2014 12.9  11.5 - 15.5 % Final  . Platelets 11/27/2014 164  150 - 400 K/uL Final  . Sodium 11/27/2014 135  135 - 145 mmol/L Final  . Potassium 11/27/2014 4.2  3.5 - 5.1 mmol/L Final  . Chloride 11/27/2014 103  101 - 111 mmol/L Final  . CO2 11/27/2014 27  22 - 32 mmol/L Final  . Glucose, Bld 11/27/2014 177* 65 - 99 mg/dL Final  . BUN 11/27/2014 18  6 - 20 mg/dL Final  . Creatinine, Ser 11/27/2014 1.31* 0.61 - 1.24 mg/dL Final  . Calcium 11/27/2014 8.8* 8.9 - 10.3 mg/dL Final  . GFR calc non Af Amer 11/27/2014 >60  >60 mL/min Final  . GFR calc Af Amer 11/27/2014 >60  >60 mL/min Final   Comment: (NOTE) The eGFR has been calculated using the CKD EPI equation. This calculation has not been validated in all clinical situations. eGFR's persistently <60  mL/min signify possible Chronic Kidney Disease.   Georgiann Hahn gap 11/27/2014 5  5 - 15 Final  Hospital Outpatient Visit on 11/21/2014  Component Date Value Ref Range Status  . aPTT 11/21/2014 37  24 - 37 seconds Final   Comment:        IF BASELINE aPTT IS ELEVATED, SUGGEST PATIENT RISK ASSESSMENT BE USED TO DETERMINE APPROPRIATE ANTICOAGULANT THERAPY.   . WBC 11/21/2014 4.5  4.0 - 10.5 K/uL Final  . RBC 11/21/2014 5.34  4.22 - 5.81 MIL/uL Final  . Hemoglobin 11/21/2014 15.8  13.0 - 17.0 g/dL Final  . HCT 11/21/2014 47.0  39.0 - 52.0 % Final  . MCV 11/21/2014 88.0  78.0 - 100.0 fL Final  . MCH 11/21/2014 29.6  26.0 - 34.0 pg Final  . MCHC 11/21/2014 33.6  30.0 - 36.0 g/dL Final  . RDW 11/21/2014 12.8  11.5 - 15.5 % Final  . Platelets 11/21/2014 214  150 - 400 K/uL Final  . Sodium 11/21/2014 138  135 -  145 mmol/L Final  . Potassium 11/21/2014 4.1  3.5 - 5.1 mmol/L Final  . Chloride 11/21/2014 105  101 - 111 mmol/L Final  . CO2 11/21/2014 28  22 - 32 mmol/L Final  . Glucose, Bld 11/21/2014 94  65 - 99 mg/dL Final  . BUN 11/21/2014 21* 6 - 20 mg/dL Final  . Creatinine, Ser 11/21/2014 1.35* 0.61 - 1.24 mg/dL Final  . Calcium 11/21/2014 9.5  8.9 - 10.3 mg/dL Final  . Total Protein 11/21/2014 7.6  6.5 - 8.1 g/dL Final  . Albumin 11/21/2014 4.2  3.5 - 5.0 g/dL Final  . AST 11/21/2014 34  15 - 41 U/L Final  . ALT 11/21/2014 29  17 - 63 U/L Final  . Alkaline Phosphatase 11/21/2014 87  38 - 126 U/L Final  . Total Bilirubin 11/21/2014 0.9  0.3 - 1.2 mg/dL Final  . GFR calc non Af Amer 11/21/2014 59* >60 mL/min Final  . GFR calc Af Amer 11/21/2014 >60  >60 mL/min Final   Comment: (NOTE) The eGFR has been calculated using the CKD EPI equation. This calculation has not been validated in all clinical situations. eGFR's persistently <60 mL/min signify possible Chronic Kidney Disease.   . Anion gap 11/21/2014 5  5 - 15 Final  . Prothrombin Time 11/21/2014 16.2* 11.6 - 15.2 seconds Final  . INR 11/21/2014 1.29  0.00 - 1.49 Final  . ABO/RH(D) 11/21/2014 O POS   Final  . Antibody Screen 11/21/2014 NEG   Final  . Sample Expiration 11/21/2014 11/29/2014   Final  . Extend sample reason 11/21/2014 NO TRANSFUSIONS OR PREGNANCY IN THE PAST 3 MONTHS   Final  . Color, Urine 11/21/2014 YELLOW  YELLOW Final  . APPearance 11/21/2014 CLEAR  CLEAR Final  . Specific Gravity, Urine 11/21/2014 1.015  1.005 - 1.030 Final  . pH 11/21/2014 6.0  5.0 - 8.0 Final  . Glucose, UA 11/21/2014 NEGATIVE  NEGATIVE mg/dL Final  . Hgb urine dipstick 11/21/2014 NEGATIVE  NEGATIVE Final  . Bilirubin Urine 11/21/2014 NEGATIVE  NEGATIVE Final  . Ketones, ur 11/21/2014 NEGATIVE  NEGATIVE mg/dL Final  . Protein, ur 11/21/2014 NEGATIVE  NEGATIVE mg/dL Final  . Urobilinogen, UA 11/21/2014 0.2  0.0 - 1.0 mg/dL Final  . Nitrite  11/21/2014 NEGATIVE  NEGATIVE Final  . Leukocytes, UA 11/21/2014 NEGATIVE  NEGATIVE Final   MICROSCOPIC NOT DONE ON URINES WITH NEGATIVE PROTEIN, BLOOD, LEUKOCYTES, NITRITE, OR GLUCOSE <1000 mg/dL.  Marland Kitchen MRSA, PCR  11/21/2014 NEGATIVE  NEGATIVE Final  . Staphylococcus aureus 11/21/2014 NEGATIVE  NEGATIVE Final   Comment:        The Xpert SA Assay (FDA approved for NASAL specimens in patients over 75 years of age), is one component of a comprehensive surveillance program.  Test performance has been validated by [facility] for patients greater than or equal to 85 year old. It is not intended to diagnose infection nor to guide or monitor treatment.   . ABO/RH(D) 11/21/2014 O POS   Final     X-Rays:Dg Pelvis Portable  11/26/2014  CLINICAL DATA:  Right hip replacement EXAM: DG C-ARM 1-60 MIN-NO REPORT; PORTABLE PELVIS 1-2 VIEWS COMPARISON:  None. FINDINGS: Right hip replacement in satisfactory position alignment. No fracture or complication Moderate degenerative change in the left hip IMPRESSION: Satisfactory right hip replacement. Electronically Signed   By: Franchot Gallo M.D.   On: 11/26/2014 13:16   Dg C-arm 1-60 Min-no Report  11/26/2014  CLINICAL DATA: surgery C-ARM 1-60 MINUTES Fluoroscopy was utilized by the requesting physician.  No radiographic interpretation.    EKG: Orders placed or performed in visit on 11/21/14  . EKG 12-Lead     Hospital Course: Patient was admitted to [facility] and taken to the OR and underwent the above state procedure without complications.  Patient tolerated the procedure well and was later transferred to the recovery room and then to the orthopaedic floor for postoperative care.  They were given PO and IV analgesics for pain control following their surgery.  They were given 24 hours of postoperative antibiotics of  Anti-infectives    Start     Dose/Rate Route Frequency Ordered Stop   11/26/14 1800  ceFAZolin (ANCEF) IVPB 2 g/50 mL premix       2 g 100 mL/hr over 30 Minutes Intravenous Every 6 hours 11/26/14 1542 11/26/14 2352   11/26/14 0852  ceFAZolin (ANCEF) IVPB 2 g/50 mL premix     2 g 100 mL/hr over 30 Minutes Intravenous On call to O.R. 11/26/14 9381 11/26/14 1100     and started on DVT prophylaxis in the form of Xarelto.   PT and OT were ordered for total hip protocol.  The patient was allowed to be WBAT with therapy. Discharge planning was consulted to help with postop disposition and equipment needs.  Patient had a good night on the evening of surgery.  They started to get up OOB with therapy on day one.  Hemovac drain was pulled without difficulty.   Patient was seen in rounds on day one by Dr. Wynelle Link and was setup to go home following therapy. He had made arrangements to go to [facility] on his own prior to the admission.  Social worker got involved to assist with possible transfer.  Roselyn Reef, our social worker spoke to [employer] and information was relayed to the patient about lack of insurance approval and that he would likely have to pay out-of-pocket for the stay.  The patient voiced his understanding and arrangements were made for his discharge.  He worked with therapy and was ready for discharge.  Discharge home with home health versus [facility] Diet - Cardiac diet Follow up - in 2 weeks Activity - WBAT Disposition - Home Condition Upon Discharge - Good D/C Meds - See DC Summary DVT Prophylaxis - Xarelto 20 mg (resuming his home dosing)  Discharge Instructions    Call MD / Call 911    Complete by:  As directed   If you experience  chest pain or shortness of breath, CALL 911 and be transported to the hospital emergency room.  If you develope a fever above 101 F, pus (white drainage) or increased drainage or redness at the wound, or calf pain, call your surgeon's office.     Change dressing    Complete by:  As directed   You may change your dressing dressing daily with sterile 4 x 4 inch gauze dressing and paper tape.  Do not  submerge the incision under water.     Constipation Prevention    Complete by:  As directed   Drink plenty of fluids.  Prune juice may be helpful.  You may use a stool softener, such as Colace (over the counter) 100 mg twice a day.  Use MiraLax (over the counter) for constipation as needed.     Diet - low sodium heart healthy    Complete by:  As directed      Diet Carb Modified    Complete by:  As directed      Discharge instructions    Complete by:  As directed   Pick up stool softner and laxative for home use following surgery while on pain medications. Do not submerge incision under water. Please use good hand washing techniques while changing dressing each day. May shower starting three days after surgery. Please use a clean towel to pat the incision dry following showers. Continue to use ice for pain and swelling after surgery. Do not use any lotions or creams on the incision until instructed by your surgeon.  Total Hip Protocol.  Resume the home dosing of Xarelto of 20 mg daily.  Postoperative Constipation Protocol  Constipation - defined medically as fewer than three stools per week and severe constipation as less than one stool per week.  One of the most common issues patients have following surgery is constipation.  Even if you have a regular bowel pattern at home, your normal regimen is likely to be disrupted due to multiple reasons following surgery.  Combination of anesthesia, postoperative narcotics, change in appetite and fluid intake all can affect your bowels.  In order to avoid complications following surgery, here are some recommendations in order to help you during your recovery period.  Colace (docusate) - Pick up an over-the-counter form of Colace or another stool softener and take twice a day as long as you are requiring postoperative pain medications.  Take with a full glass of water daily.  If you experience loose stools or diarrhea, hold the colace until you stool  forms back up.  If your symptoms do not get better within 1 week or if they get worse, check with your doctor.  Dulcolax (bisacodyl) - Pick up over-the-counter and take as directed by the product packaging as needed to assist with the movement of your bowels.  Take with a full glass of water.  Use this product as needed if not relieved by Colace only.   MiraLax (polyethylene glycol) - Pick up over-the-counter to have on hand.  MiraLax is a solution that will increase the amount of water in your bowels to assist with bowel movements.  Take as directed and can mix with a glass of water, juice, soda, coffee, or tea.  Take if you go more than two days without a movement. Do not use MiraLax more than once per day. Call your doctor if you are still constipated or irregular after using this medication for 7 days in a row.  If you continue  to have problems with postoperative constipation, please contact the office for further assistance and recommendations.  If you experience "the worst abdominal pain ever" or develop nausea or vomiting, please contact the office immediatly for further recommendations for treatment.  When discharged from the skilled rehab facility, please have the facility set up the patient's [place] prior to being released.  Please make sure this gets set up prior to release in order to avoid any lapse of therapy following the rehab stay.  Also provide the patient with their medications at time of release from the facility to include their pain medication, the muscle relaxants, and their blood thinner medication.  If the patient is still at the rehab facility at time of follow up appointment, please also assist the patient in arranging follow up appointment in our office and any transportation needs. ICE to the affected knee or hip every three hours for 30 minutes at a time and then as needed for pain and swelling.     Do not sit on low chairs, stoools or toilet seats, as  it may be difficult to get up from low surfaces    Complete by:  As directed      Driving restrictions    Complete by:  As directed   No driving until released by the physician.     Increase activity slowly as tolerated    Complete by:  As directed      Lifting restrictions    Complete by:  As directed   No lifting until released by the physician.     Patient may shower    Complete by:  As directed   You may shower without a dressing once there is no drainage.  Do not wash over the wound.  If drainage remains, do not shower until drainage stops.     TED hose    Complete by:  As directed   Use stockings (TED hose) for 3 weeks on both leg(s).  You may remove them at night for sleeping.     Weight bearing as tolerated    Complete by:  As directed   Laterality:  right  Extremity:  Lower            Medication List    STOP taking these medications        ANDROGEL PUMP 20.25 MG/ACT (1.62%) Gel  Generic drug:  Testosterone     multivitamin tablet     omega-3 acid ethyl esters 1 G capsule  Commonly known as:  LOVAZA      TAKE these medications        acetaminophen 325 MG tablet  Commonly known as:  TYLENOL  Take 2 tablets (650 mg total) by mouth every 6 (six) hours as needed for mild pain (or Fever >/= 101).     ALPRAZolam 0.25 MG tablet  Commonly known as:  XANAX  Take 0.25 mg by mouth 2 (two) times daily.     amLODipine 5 MG tablet  Commonly known as:  NORVASC  Take 1 tablet (5 mg total) by mouth daily.     bisacodyl 10 MG suppository  Commonly known as:  DULCOLAX  Place 1 suppository (10 mg total) rectally daily as needed for moderate constipation.     docusate sodium 100 MG capsule  Commonly known as:  COLACE  Take 1 capsule (100 mg total) by mouth 2 (two) times daily.     ezetimibe 10 MG tablet  Commonly known as:  ZETIA  Take 1 tablet (10 mg total) by mouth daily.     furosemide 20 MG tablet  Commonly known as:  LASIX  Take 1 tablet (20 mg total) by mouth  daily as needed for fluid.     lisinopril 20 MG tablet  Commonly known as:  PRINIVIL,ZESTRIL  Take 20 mg by mouth 2 (two) times daily.     methocarbamol 500 MG tablet  Commonly known as:  ROBAXIN  Take 1 tablet (500 mg total) by mouth every 6 (six) hours as needed for muscle spasms.     metoCLOPramide 5 MG tablet  Commonly known as:  REGLAN  Take 1-2 tablets (5-10 mg total) by mouth every 8 (eight) hours as needed for nausea (if ondansetron (ZOFRAN) ineffective.).     metoprolol succinate 25 MG 24 hr tablet  Commonly known as:  TOPROL-XL  TAKE ONE TABLET BY MOUTH EVERY MORNING     omeprazole 20 MG capsule  Commonly known as:  PRILOSEC  Take 1 capsule (20 mg total) by mouth daily.     ondansetron 4 MG tablet  Commonly known as:  ZOFRAN  Take 1 tablet (4 mg total) by mouth every 6 (six) hours as needed for nausea.     oxyCODONE 5 MG immediate release tablet  Commonly known as:  Oxy IR/ROXICODONE  Take 1-2 tablets (5-10 mg total) by mouth every 3 (three) hours as needed for moderate pain or severe pain.     polyethylene glycol packet  Commonly known as:  MIRALAX / GLYCOLAX  Take 17 g by mouth daily as needed for mild constipation.     propafenone 325 MG 12 hr capsule  Commonly known as:  RYTHMOL SR  Take 1 capsule (325 mg total) by mouth 2 (two) times daily.     rivaroxaban 20 MG Tabs tablet  Commonly known as:  XARELTO  Take 1 tablet (20 mg total) by mouth daily with supper.     sodium phosphate 7-19 GM/118ML Enem  Place 133 mLs (1 enema total) rectally once as needed for moderate constipation or severe constipation.     SYNTHROID 150 MCG tablet  Generic drug:  levothyroxine  Take 300 mcg by mouth daily.     traMADol 50 MG tablet  Commonly known as:  ULTRAM  Take 1-2 tablets (50-100 mg total) by mouth every 6 (six) hours as needed (mild pain).           Follow-up Information    Follow up with Gearlean Alf, MD. Schedule an appointment as soon as possible for  a visit on 12/09/2014.   Specialty:  Orthopedic Surgery   Why:  Call office at [phone] to setup appointment on Tuesday 12/09/2014 with Dr. Wynelle Link.   Contact information:   [address] [place] [address] [phone]       Signed: Arlee Muslim, PA-C Orthopaedic Surgery 11/27/2014, 11:09 AM

## 2014-11-27 NOTE — Progress Notes (Signed)
Physical Therapy Treatment Patient Details Name: Isaiah [name], Isaiah [name] MRN: [id number] DOB: 1960/05/06 Today's Date: 11/27/2014    History of Present Illness R THR    PT Comments    Pt progressing mobility but increased assist OOB this am.  Pt states he plans "a few days at [facility]" prior to return home.  Follow Up Recommendations  Home health PT;SNF     Equipment Recommendations  Rolling walker with 5" wheels    Recommendations for Other Services OT consult     Precautions / Restrictions Precautions Precautions: Fall Restrictions Weight Bearing Restrictions: No Other Position/Activity Restrictions: WBAT    Mobility  Bed Mobility Overal bed mobility: Needs Assistance Bed Mobility: Supine to Sit     Supine to sit: Min assist;Mod assist Sit to supine: Min assist   General bed mobility comments: cues for sequence and use of L LE to self assist; physical assist for R LE management and to bring trunk to upright  Transfers Overall transfer level: Needs assistance Equipment used: Rolling walker (2 wheeled) Transfers: Sit to/from Stand Sit to Stand: Min guard;From elevated surface         General transfer comment: cues for LE management and use of UEs to self assist  Ambulation/Gait Ambulation/Gait assistance: Min guard Ambulation Distance (Feet): 222 Feet Assistive device: Rolling walker (2 wheeled) Gait Pattern/deviations: Step-to pattern;Decreased step length - right;Decreased step length - left;Shuffle;Trunk flexed Gait velocity: decr   General Gait Details: cues for posture, position from RW and initial sequence   Stairs            Wheelchair Mobility    Modified Rankin (Stroke Patients Only)       Balance                                    Cognition Arousal/Alertness: Awake/alert Behavior During Therapy: WFL for tasks assessed/performed Overall Cognitive Status: Within Functional Limits for tasks assessed                      Exercises Total Joint Exercises Ankle Circles/Pumps: AROM;Both;15 reps;Supine Quad Sets: AROM;Both;10 reps;Supine Heel Slides: AAROM;20 reps;Supine;Right Hip ABduction/ADduction: AAROM;Right;15 reps;Supine    General Comments        Pertinent Vitals/Pain Pain Assessment: 0-10 Pain Score: 2  Pain Location: R hip Pain Descriptors / Indicators: Aching;Sore Pain Intervention(s): Limited activity within patient's tolerance;Monitored during session;Ice applied    Home Living Family/patient expects to be discharged to:: Private residence Living Arrangements: Alone Available Help at Discharge: Available PRN/intermittently Type of Home: House Home Access: Stairs to enter Entrance Stairs-Rails: Right Home Layout: Able to live on main level with bedroom/bathroom Home Equipment: None      Prior Function Level of Independence: Independent      Comments: Working as Education officer, community   PT Goals (current goals can now be found in the care plan section) Acute Rehab PT Goals Patient Stated Goal: Resume previous lifestyle with decreased pain PT Goal Formulation: With patient Time For Goal Achievement: 11/29/14 Potential to Achieve Goals: Good Progress towards PT goals: Progressing toward goals    Frequency  7X/week    PT Plan Current plan remains appropriate    Co-evaluation             End of Session   Activity Tolerance: Patient tolerated treatment well Patient left: in chair;with call bell/phone within reach;with family/visitor present     Time: 7829-5621  PT Time Calculation (min) (ACUTE ONLY): 31 min  Charges:  $Gait Training: 8-22 mins $Therapeutic Exercise: 8-22 mins                    G Codes:      Anuoluwapo Mefferd 11/27/2014, 12:27 PM

## 2014-11-27 NOTE — Progress Notes (Signed)
[facility] [employer] Data SystemsFranklin RN

## 2014-12-03 ENCOUNTER — Other Ambulatory Visit: Payer: Self-pay | Admitting: Cardiology

## 2014-12-25 ENCOUNTER — Other Ambulatory Visit: Payer: Self-pay | Admitting: Cardiology

## 2015-01-01 ENCOUNTER — Other Ambulatory Visit: Payer: Self-pay | Admitting: Cardiology

## 2015-01-07 ENCOUNTER — Other Ambulatory Visit: Payer: Self-pay | Admitting: Cardiology

## 2015-01-07 NOTE — Telephone Encounter (Signed)
Isaiah Clementhomas Brackbill, MD at 11/21/2014 9:27 AM  metoprolol succinate (TOPROL-XL) 25 MG 24 hr tabletTAKE ONE TABLET BY MOUTH EVERY MORNING Patient Instructions     Medication Instructions:  Your physician recommends that you continue on your current medications as directed. Please refer to the Current Medication list given to you today.

## 2015-01-19 ENCOUNTER — Other Ambulatory Visit: Payer: Self-pay | Admitting: Cardiology

## 2015-02-06 ENCOUNTER — Telehealth: Payer: Self-pay | Admitting: Cardiology

## 2015-02-06 NOTE — Telephone Encounter (Signed)
Scheduled patient for 6 month Sleep follow-up on 04/24/15 10am. Instructed him that if he has any issues between now and then to call me.

## 2015-02-06 NOTE — Telephone Encounter (Signed)
New Message  Pt calling to speak w/ RN concerning his CPAP. Please call back and discuss.

## 2015-02-13 ENCOUNTER — Encounter: Payer: Self-pay | Admitting: *Deleted

## 2015-02-24 ENCOUNTER — Other Ambulatory Visit: Payer: Self-pay | Admitting: Cardiology

## 2015-02-25 ENCOUNTER — Telehealth: Payer: Self-pay | Admitting: Cardiology

## 2015-02-25 NOTE — Telephone Encounter (Signed)
Pt calling requesting a refill on Omega 3 (Lovaza) 1 G. I left a message informing pt that this medication was D/C in the hospital on 11/2014 and that he could give our office a call back. Please advise

## 2015-03-03 ENCOUNTER — Telehealth: Payer: Self-pay | Admitting: Cardiology

## 2015-03-03 NOTE — Telephone Encounter (Signed)
New message      Pt states his lovaza presc has been terminated.  He does not know why.  He request to talk to Melinda----ok to wait until she returns

## 2015-03-04 MED ORDER — OMEGA-3-ACID ETHYL ESTERS 1 G PO CAPS: 1.0000 g | ORAL_CAPSULE | Freq: Two times a day (BID) | ORAL | Status: DC

## 2015-03-04 NOTE — Telephone Encounter (Signed)
Discussed with  Dr. Patty Sermons and patient to continue Lovaza as he has been taking Spoke with patient and he has been taking Lovaza 1 g 2 tablets twice a day Sent Rx to [facility]

## 2015-03-04 NOTE — Telephone Encounter (Signed)
Discussed with  Dr. Brackbill and patient to continue Lovaza as he has been taking Spoke with patient and he has been taking Lovaza 1 g 2 tablets twice a day Sent Rx to [facility]  

## 2015-03-20 ENCOUNTER — Ambulatory Visit: Payer: BLUE CROSS/BLUE SHIELD | Admitting: Nurse Practitioner

## 2015-03-27 ENCOUNTER — Telehealth: Payer: Self-pay | Admitting: *Deleted

## 2015-03-27 NOTE — Telephone Encounter (Signed)
Received a call from the patient stating that when he received the lovaza last month, the bottle indicated that he had no remaining refills. Per epic patient has refills remaining. I called [facility] to follow up on this and was informed that he has three refills left.

## 2015-04-17 ENCOUNTER — Ambulatory Visit (INDEPENDENT_AMBULATORY_CARE_PROVIDER_SITE_OTHER): Payer: BLUE CROSS/BLUE SHIELD | Admitting: Nurse Practitioner

## 2015-04-17 ENCOUNTER — Encounter: Payer: Self-pay | Admitting: Nurse Practitioner

## 2015-04-17 ENCOUNTER — Ambulatory Visit: Payer: Self-pay | Admitting: General Surgery

## 2015-04-17 VITALS — BP 108/70 | HR 72 | Ht 75.5 in | Wt 251.4 lb

## 2015-04-17 DIAGNOSIS — E785 Hyperlipidemia, unspecified: Secondary | ICD-10-CM | POA: Diagnosis not present

## 2015-04-17 DIAGNOSIS — I10 Essential (primary) hypertension: Secondary | ICD-10-CM | POA: Diagnosis not present

## 2015-04-17 DIAGNOSIS — I480 Paroxysmal atrial fibrillation: Secondary | ICD-10-CM | POA: Diagnosis not present

## 2015-04-17 DIAGNOSIS — G4733 Obstructive sleep apnea (adult) (pediatric): Secondary | ICD-10-CM

## 2015-04-17 MED ORDER — LISINOPRIL 20 MG PO TABS: 20.0000 mg | ORAL_TABLET | Freq: Every day | ORAL | Status: DC

## 2015-04-17 NOTE — Patient Instructions (Addendum)
We will be checking the following labs today - NONE   Medication Instructions:    Continue with your current medicines. BUT  I am cutting the Lisinopril back to just one pill a day.     Testing/Procedures To Be Arranged:  N/A  Follow-Up:   See Dr. Elease HashimotoNahser in 3 months.     Other Special Instructions:   Keep up the good work with losing weight!    If you need a refill on your cardiac medications before your next appointment, please call your pharmacy.   Call the [facility] office at [phone] if you have any questions, problems or concerns.

## 2015-04-17 NOTE — H&P (Signed)
Isaiah [name]. Hamblen MD 04/17/2015 12:08 PM Location: [facility] Patient #: [id number] DOB: 1960-07-23 Divorced / Language: [place] / Race: White Male  History of Present Illness Adolph Pollack MD; 04/17/2015 12:23 PM) The patient is a 55 year old male.   Note:He presents today for a preop visit for umbilical hernia repair. I saw him for this October 2016. He underwent laparoscopic repair of left inguinal hernia with mesh 05/25/2010. About a 1.5 years ago he noted a small bulge that has been getting larger and is sensitive to the touch. He had a right hip replacement by Dr. [name] November 9 and is doing well from that. He is on chronic anticoagulation therapy with Xarelto because of lupus anticoagulant deficiency. He has sleep apnea and is using his CPAP machine which makes him feel much better.  Other Problems Rowe Clack, RN, BSN; 04/17/2015 12:08 PM) Atrial Fibrillation Gastroesophageal Reflux Disease High blood pressure Hypercholesterolemia Inguinal Hernia Thyroid Disease Umbilical Hernia Repair  Past Surgical History Rowe Clack, RN, BSN; 04/17/2015 12:08 PM) Hip Surgery Right. Laparoscopic Inguinal Hernia Surgery Left. Open Inguinal Hernia Surgery Right. Oral Surgery Shoulder Surgery Left.  Diagnostic Studies History Rowe Clack, RN, BSN; 04/17/2015 12:08 PM) Colonoscopy 1-5 years ago  Medication History Rowe Clack, RN, BSN; 04/17/2015 12:13 PM) Zetia (  Tablet, Oral daily) Active. Pepcid (  Tablet, Oral daily) Active. Medications Reconciled  Social History Rowe Clack, RN, BSN; 04/17/2015 12:08 PM) Alcohol use Moderate alcohol use. No caffeine use No drug use Tobacco use Never smoker.  Family History (Rowe Clack, RN, BSN; 04/17/2015 12:08 PM) Breast Cancer Mother. Hypertension Brother, Father, Mother. Migraine Headache Father. Prostate Cancer Brother.     Review of Systems Therapist, occupational, BSN; 04/17/2015 12:08 PM) General Not Present- Appetite Loss, Chills, Fatigue, Fever, Night Sweats, Weight Gain and Weight Loss. Skin Not Present- Change in Wart/Mole, Dryness, Hives, Jaundice, New Lesions, Non-Healing Wounds, Rash and Ulcer. HEENT Not Present- Earache, Hearing Loss, Hoarseness, Nose Bleed, Oral Ulcers, Ringing in the Ears, Seasonal Allergies, Sinus Pain, Sore Throat, Visual Disturbances, Wears glasses/contact lenses and Yellow Eyes. Respiratory Not Present- Bloody sputum, Chronic Cough, Difficulty Breathing, Snoring and Wheezing. Breast Not Present- Breast Mass, Breast Pain, Nipple Discharge and Skin Changes. Cardiovascular Not Present- Chest Pain, Difficulty Breathing Lying Down, Leg Cramps, Palpitations, Rapid Heart Rate, Shortness of Breath and Swelling of Extremities. Gastrointestinal Not Present- Abdominal Pain, Bloating, Bloody Stool, Change in Bowel Habits, Chronic diarrhea, Constipation, Difficulty Swallowing, Excessive gas, Gets full quickly at meals, Hemorrhoids, Indigestion, Nausea, Rectal Pain and Vomiting. Male Genitourinary Not Present- Blood in Urine, Change in Urinary Stream, Frequency, Impotence, Nocturia, Painful Urination, Urgency and Urine Leakage. Musculoskeletal Not Present- Back Pain, Joint Pain, Joint Stiffness, Muscle Pain, Muscle Weakness and Swelling of Extremities. Neurological Not Present- Decreased Memory, Fainting, Headaches, Numbness, Seizures, Tingling, Tremor, Trouble walking and Weakness. Psychiatric Not Present- Anxiety, Bipolar, Change in Sleep Pattern, Depression, Fearful and Frequent crying. Endocrine Not Present- Cold Intolerance, Excessive Hunger, Hair Changes, Heat Intolerance, Hot flashes and New Diabetes. Hematology Present- Excessive bleeding. Not Present- Easy Bruising, Gland problems, HIV and Persistent Infections.  Vitals Glass blower/designer, BSN; 04/17/2015 12:09 PM) 04/17/2015 12:09 PM Weight: 250.2 lb Temp.:  97.13F(Oral)  [name]: 78 (Regular)  BP: 158/100 (Sitting, Left Arm, Standard)      Physical Exam Adolph Pollack MD; 04/17/2015 12:41 PM)  The physical exam findings are as follows: Note:General: WDWN in NAD. Pleasant and cooperative.  HEENT: [place]/AT, no facial masses  EYES: no icterus  CV: RRR, no murmur, no JVD.  CHEST: Breath sounds equal and clear. Respirations nonlabored.  ABDOMEN: Soft, small lower abdominal scars, epigastric diastasis recti, reducible umbilical bulge with palpable fascial defect  NEUROLOGIC: Alert and oriented, answers questions appropriately.  PSYCHIATRIC: Normal mood, affect , and behavior.    Assessment & Plan Adolph Pollack(Nyair Depaulo J. Ellowyn Rieves MD; 04/17/2015 12:42 PM)  UMBILICAL HERNIA WITHOUT OBSTRUCTION AND WITHOUT GANGRENE (K42.9) Impression: Symptomatic at times.  Plan: Open umbilical hernia repair with mesh. Hold Xarelto for 48 hours prior to surgery. I have discussed the procedure, risks, and aftercare. Risks include but are not limited to bleeding, infection, wound healing problems, anesthesia, recurrence, injury to intra-abdominal organs, cosmetic deformity. He seems to understand and would like to proceed.  Avel Peaceodd Deshannon Hinchliffe, MD

## 2015-04-17 NOTE — Progress Notes (Signed)
CARDIOLOGY OFFICE NOTE  Date:  04/17/2015    Isaiah [name], Isaiah [name] Date of Birth: 1960/10/10 Medical Record #[id number]  PCP:  [name], MD  Cardiologist:  Former patient of Dr. Yevonne Pax.     Chief Complaint  Patient presents with  . Hypertension  . Hyperlipidemia  . Atrial Fibrillation    4 month check - former patient of Dr. Yevonne Pax.     History of Present Illness: Isaiah [name], Isaiah [name] is a 55 y.o. male who presents today for a 4 month check. Former patient of Dr. Yevonne Pax. Will establish with Dr. Elease Hashimoto going forward.   He has a history of prior PE and found to have a hypercoagulability state with abnormal lupus anticoagulant - he is committed to lifelong anticoagulation and is on Xarelto. He has had frequent DVT, PAF, hypothyroidism, HLD and HTN.  He has sleep apnea and is on CPAP - followed by Dr. Mayford Knife.   In January 2014 the patient experienced some chest tightness and underwent a treadmill Myoview stress test which showed no evidence of ischemia and his ejection fraction was 53% with no wall motion abnormalities.   Last seen in November. Cardiac status was stable.   Comes back today. Here alone. He is doing well. He had his hip replacement back in November - did well. Back on track with losing weight - says he is down 20 pounds. BP is lower. No chest pain. Breathing is good. Rhythm is good. Asking about reduction of medicines. His significant other has been diagnosed with breast cancer and he has been helping with her.    Past Medical History  Diagnosis Date  . Hypertension   . Hyperlipidemia   . Situational stress   . Diastolic dysfunction     Grade I per echo in 2/12  . PAF (paroxysmal atrial fibrillation) (HCC)     no recurrence since Feb 2012  . PONV (postoperative nausea and vomiting)   . DVT (deep venous thrombosis) (HCC) 2000's    "both legs in the calves; left went all the way up to my groin" (09/17/2012)  . Pulmonary embolism (HCC)  09/17/2012    "just today; 3 on the right; 2 on the left" (09/17/2012)  . Hypothyroidism     "wiped out from taking amiodarone" (09/17/2012)  . Obesity (BMI 30-39.9) 07/17/2014  . Dysrhythmia   . Sleep apnea     uses CPAP  . Exertional shortness of breath     "just yesterday" (09/17/2012); pt denies any current issues with SOB   . History of bronchitis   . GERD (gastroesophageal reflux disease)   . Blood dyscrasia     lupus anticoagulant  . Lyme disease     history of   . Hypercoagulation syndrome (HCC)     secondary to circulating lupus anticoagulant    Past Surgical History  Procedure Laterality Date  . Cardiovascular stress test  07/17/2009    EF 59%  . Transthoracic echocardiogram  03/05/2010    EF 60-65%  . Shoulder surgery      left  . Inguinal hernia repair Right 1992  . Inguinal hernia repair Left 2013  . Distal biceps tendon repair Right 2011  . Shoulder arthroscopy w/ rotator cuff repair Left 09/04/2012  . Cardioversion    . Wisdom tooth extraction      lower 2  . Total hip arthroplasty Right 11/26/2014    Procedure: RIGHT TOTAL HIP ARTHROPLASTY ANTERIOR APPROACH;  Surgeon: Ollen Gross, MD;  Location:  [facility];  Service: Orthopedics;  Laterality: Right;     Medications: Current Outpatient Prescriptions  Medication Sig Dispense Refill  . ALPRAZolam (XANAX) 0.25 MG tablet Take 0.25 mg by mouth 2 (two) times daily.    Marland Kitchen. amLODipine (NORVASC) 5 MG tablet TAKE 1 TABLET BY MOUTH ONCE DAILY 90 tablet 2  . ANDROGEL PUMP 20.25 MG/ACT (1.62%) GEL Apply 1 application topically daily.   4  . furosemide (LASIX) 20 MG tablet Take 1 tablet (20 mg total) by mouth daily as needed for fluid. 90 tablet 3  . lisinopril (PRINIVIL,ZESTRIL) 20 MG tablet Take 1 tablet (20 mg total) by mouth daily. 60 tablet 11  . metoprolol succinate (TOPROL-XL) 25 MG 24 hr tablet TAKE ONE TABLET BY MOUTH EVERY MORNING 90 tablet 2  . omega-3 acid ethyl esters (LOVAZA) 1 g capsule Take 1 capsule (1 g total) by  mouth 2 (two) times daily. 360 capsule 3  . omeprazole (PRILOSEC) 20 MG capsule Take 1 capsule (20 mg total) by mouth daily. 30 capsule 0  . propafenone (RYTHMOL SR) 325 MG 12 hr capsule Take 1 capsule (325 mg total) by mouth 2 (two) times daily. 180 capsule 3  . SYNTHROID 150 MCG tablet Take 300 mcg by mouth daily.   0  . XARELTO 20 MG TABS tablet TAKE 1 TABLET BY MOUTH DAILY WITH SUPPER 90 tablet 2  . ZETIA 10 MG tablet TAKE 1 TABLET BY MOUTH ONCE DAILY 90 tablet 3   No current facility-administered medications for this visit.    Allergies: Allergies  Allergen Reactions  . Crestor [Rosuvastatin Calcium] Other (See Comments)    hepatitis  . Lipitor [Atorvastatin Calcium] Other (See Comments)    Feels like someone hit him with 2x4    Social History: The patient  reports that he has never smoked. He has never used smokeless tobacco. He reports that he drinks about 4.8 oz of alcohol per week. He reports that he does not use illicit drugs.   Family History: The patient's family history includes Heart attack in his maternal grandfather; Hyperlipidemia in his mother; Hypertension in his mother.   Review of Systems: Please see the history of present illness.   Otherwise, the review of systems is positive for none.   All other systems are reviewed and negative.   Physical Exam: VS:  BP 108/70 mmHg  Pulse 72  Ht 6' 3.5" (1.918 m)  Wt 251 lb 6.4 oz (114.034 kg)  BMI 31.00 kg/m2 .  BMI Body mass index is 31 kg/(m^2).  Wt Readings from Last 3 Encounters:  04/17/15 251 lb 6.4 oz (114.034 kg)  11/26/14 260 lb (117.935 kg)  11/21/14 260 lb 6 oz (118.105 kg)    General: Pleasant. Well developed, well nourished and in no acute distress. He is down 9 pounds.  HEENT: Normal. Neck: Supple, no JVD, carotid bruits, or masses noted.  Cardiac: Regular rate and rhythm. His heart tones are distant. No edema.  Respiratory:  Lungs are clear to auscultation bilaterally with normal work of breathing.   GI: Soft and nontender.  MS: No deformity or atrophy. Gait and ROM intact. Skin: Warm and dry. Color is normal.  Neuro:  Strength and sensation are intact and no gross focal deficits noted.  Psych: Alert, appropriate and with normal affect.   LABORATORY DATA:  EKG:  EKG is not ordered today.  Lab Results  Component Value Date   WBC 8.4 11/27/2014   HGB 13.4 11/27/2014   HCT 40.6  11/27/2014   PLT 164 11/27/2014   GLUCOSE 177* 11/27/2014   CHOL 189 04/04/2014   TRIG 116.0 04/04/2014   HDL 40.60 04/04/2014   LDLCALC 125* 04/04/2014   ALT 29 11/21/2014   AST 34 11/21/2014   NA 135 11/27/2014   K 4.2 11/27/2014   CL 103 11/27/2014   CREATININE 1.31* 11/27/2014   BUN 18 11/27/2014   CO2 27 11/27/2014   TSH 3.70 11/29/2013   INR 1.29 11/21/2014    BNP (last 3 results) No results for input(s): BNP in the last 8760 hours.  ProBNP (last 3 results) No results for input(s): PROBNP in the last 8760 hours.   Other Studies Reviewed Today:  Echo Study Conclusions from 2014  Left ventricle: The cavity size was normal. Wall thickness was increased in a pattern of mild LVH. Systolic function was normal. The estimated ejection fraction was in the range of 55% to 60%. There was an increased relative contribution of atrial contraction to ventricular filling.   Myoview Impression from 2014 Exercise Capacity: Good exercise capacity. BP Response: Normal blood pressure response. Clinical Symptoms: Mild dyspnea ECG Impression: No significant ST segment change suggestive of ischemia. Comparison with Prior Nuclear Study: No significant change from previous study  Overall Impression: Low risk stress nuclear study. No evidence for ischemia or infarction. EF mildly decreased.   LV Ejection Fraction: 53%. LV Wall Motion: Mild global hypokinesis.   Marca Ancona 02/06/2012   Assessment/Plan: 1. Hypercoagulable syndrome secondary to circulating lupus anticoagulant. Past  history of pulmonary emboli and past history of DVTs. He is on lifelong anticoagulation with Xarelto. No problems noted.  2. Paroxysmal atrial fibrillation, currently maintaining normal sinus rhythm  3. Hypercholesterolemia - he just had labs by PCP  4. Essential hypertension - BP much lower - he has lost weight. Will cut the Lisinopril back to just once a day. He will continue to monitor.   5. Hypothyroidism followed by Dr. Evlyn Kanner  6. Osteoarthritis of right hip. Now s/p THR. Doing well.   Current medicines are reviewed with the patient today.  The patient does not have concerns regarding medicines other than what has been noted above.  The following changes have been made:  See above.  Labs/ tests ordered today include:   No orders of the defined types were placed in this encounter.     Disposition:   FU with Dr. Melburn Popper in 3 months.    Patient is agreeable to this plan and will call if any problems develop in the interim.   Signed: Rosalio Macadamia, RN, ANP-C 04/17/2015 8:34 AM  [facility] [address] [place], [place]  [address] Phone: [phone] Fax: [phone]

## 2015-04-20 DIAGNOSIS — G4733 Obstructive sleep apnea (adult) (pediatric): Secondary | ICD-10-CM | POA: Diagnosis not present

## 2015-04-22 ENCOUNTER — Other Ambulatory Visit: Payer: Self-pay | Admitting: Cardiology

## 2015-04-22 NOTE — Telephone Encounter (Signed)
May give him  Alprazolam 0.25 mg  # 30 with no refills  He will need to get further refills from his primary MD. Unfortunately , I dont prescribe these meds on a routine basis

## 2015-04-24 ENCOUNTER — Other Ambulatory Visit: Payer: Self-pay | Admitting: Cardiology

## 2015-04-24 ENCOUNTER — Encounter: Payer: Self-pay | Admitting: Cardiology

## 2015-04-24 ENCOUNTER — Ambulatory Visit (INDEPENDENT_AMBULATORY_CARE_PROVIDER_SITE_OTHER): Payer: BLUE CROSS/BLUE SHIELD | Admitting: Cardiology

## 2015-04-24 VITALS — BP 138/92 | HR 59 | Ht 75.5 in | Wt 254.0 lb

## 2015-04-24 DIAGNOSIS — G4733 Obstructive sleep apnea (adult) (pediatric): Secondary | ICD-10-CM

## 2015-04-24 DIAGNOSIS — I10 Essential (primary) hypertension: Secondary | ICD-10-CM

## 2015-04-24 DIAGNOSIS — I480 Paroxysmal atrial fibrillation: Secondary | ICD-10-CM

## 2015-04-24 DIAGNOSIS — E669 Obesity, unspecified: Secondary | ICD-10-CM | POA: Diagnosis not present

## 2015-04-24 MED ORDER — LISINOPRIL 20 MG PO TABS: 30.0000 mg | ORAL_TABLET | Freq: Every day | ORAL | Status: DC

## 2015-04-24 NOTE — Progress Notes (Signed)
Cardiology Office Note    Date:  04/24/2015   ID:  Isaiah [name], Isaiah [name], DOB 11-Feb-1960, MRN [id number]  PCP:  [name], MD  Cardiologist:  Quintella Reichert, MD   Chief Complaint  Patient presents with  . Sleep Apnea  . Hypertension    History of Present Illness:  Isaiah [name], Isaiah [name] is a 55 y.o. male who presents for followup of sleep apnea. He was recently referred for PSG by Dr. Patty Sermons due to witnessed apnea, snoring and excessive daytime sleepiness. Sleep study showed severe OSA with an AHI of 33 events per hour. He had oxygen desaturations as low as 82%. There was moderate snoring. His Epworth sleepiness scale was elevated at 18. He is doing well with his device and is on CPAP at 14cm H2O.   He tolerates the nasal pillow mask with chin strap and feels the pressure is adequate. Since starting CPAP feels rested in the am and has no daytime sleepiness.He does not think he is snoring.   He has some mouth dryness but uses a chin strap.     Past Medical History  Diagnosis Date  . Hypertension   . Hyperlipidemia   . Situational stress   . Diastolic dysfunction     Grade I per echo in 2/12  . PAF (paroxysmal atrial fibrillation) (HCC)     no recurrence since Feb 2012  . PONV (postoperative nausea and vomiting)   . DVT (deep venous thrombosis) (HCC) 2000's    "both legs in the calves; left went all the way up to my groin" (09/17/2012)  . Pulmonary embolism (HCC) 09/17/2012    "just today; 3 on the right; 2 on the left" (09/17/2012)  . Hypothyroidism     "wiped out from taking amiodarone" (09/17/2012)  . Obesity (BMI 30-39.9) 07/17/2014  . Dysrhythmia   . Sleep apnea     uses CPAP  . Exertional shortness of breath     "just yesterday" (09/17/2012); pt denies any current issues with SOB   . History of bronchitis   . GERD (gastroesophageal reflux disease)   . Blood dyscrasia     lupus anticoagulant  . Lyme disease     history of   . Hypercoagulation syndrome (HCC)       secondary to circulating lupus anticoagulant    Past Surgical History  Procedure Laterality Date  . Cardiovascular stress test  07/17/2009    EF 59%  . Transthoracic echocardiogram  03/05/2010    EF 60-65%  . Shoulder surgery      left  . Inguinal hernia repair Right 1992  . Inguinal hernia repair Left 2013  . Distal biceps tendon repair Right 2011  . Shoulder arthroscopy w/ rotator cuff repair Left 09/04/2012  . Cardioversion    . Wisdom tooth extraction      lower 2  . Total hip arthroplasty Right 11/26/2014    Procedure: RIGHT TOTAL HIP ARTHROPLASTY ANTERIOR APPROACH;  Surgeon: Ollen Gross, MD;  Location: [facility];  Service: Orthopedics;  Laterality: Right;    Current Medications: Outpatient Prescriptions Prior to Visit  Medication Sig Dispense Refill  . ALPRAZolam (XANAX) 0.25 MG tablet Take 1 tablet (0.25 mg total) by mouth 2 (two) times daily as needed for anxiety. 30 tablet 0  . amLODipine (NORVASC) 5 MG tablet TAKE 1 TABLET BY MOUTH ONCE DAILY 90 tablet 2  . ANDROGEL PUMP 20.25 MG/ACT (1.62%) GEL Apply 1 application topically daily.   4  . furosemide (LASIX) 20  MG tablet Take 1 tablet (20 mg total) by mouth daily as needed for fluid. 90 tablet 3  . lisinopril (PRINIVIL,ZESTRIL) 20 MG tablet Take 1 tablet (20 mg total) by mouth daily. 60 tablet 11  . metoprolol succinate (TOPROL-XL) 25 MG 24 hr tablet TAKE ONE TABLET BY MOUTH EVERY MORNING 90 tablet 2  . omega-3 acid ethyl esters (LOVAZA) 1 g capsule Take 1 capsule (1 g total) by mouth 2 (two) times daily. 360 capsule 3  . omeprazole (PRILOSEC) 20 MG capsule Take 1 capsule (20 mg total) by mouth daily. 30 capsule 0  . propafenone (RYTHMOL SR) 325 MG 12 hr capsule Take 1 capsule (325 mg total) by mouth 2 (two) times daily. 180 capsule 3  . SYNTHROID 150 MCG tablet Take 300 mcg by mouth daily.   0  . XARELTO 20 MG TABS tablet TAKE 1 TABLET BY MOUTH DAILY WITH SUPPER 90 tablet 2  . ZETIA 10 MG tablet TAKE 1 TABLET BY MOUTH ONCE  DAILY 90 tablet 3   No facility-administered medications prior to visit.     Allergies:   Crestor and Lipitor   Social History   Social History  . Marital Status: Divorced    Spouse Name: N/A  . Number of Children: N/A  . Years of Education: N/A   Social History Main Topics  . Smoking status: Never Smoker   . Smokeless tobacco: Never Used  . Alcohol Use: 4.8 oz/week    4 Cans of beer, 4 Shots of liquor per week     Comment: 09/17/2012 "1-2 beers and 1-2 tequila on Fri and Sat nights"  . Drug Use: No  . Sexual Activity: Yes   Other Topics Concern  . None   Social History Narrative     Family History:  The patient's family history includes Heart attack in his maternal grandfather; Hyperlipidemia in his mother; Hypertension in his mother.   ROS:   Please see the history of present illness.    ROS All other systems reviewed and are negative.   PHYSICAL EXAM:   VS:  BP 138/92 mmHg  Pulse 59  Ht 6' 3.5" (1.918 m)  Wt 254 lb (115.214 kg)  BMI 31.32 kg/m2   GEN: Well nourished, well developed, in no acute distress HEENT: normal Neck: no JVD, carotid bruits, or masses Cardiac: RRR; no murmurs, rubs, or gallops,no edema.  Intact distal pulses bilaterally.  Respiratory:  clear to auscultation bilaterally, normal work of breathing GI: soft, nontender, nondistended, + BS MS: no deformity or atrophy Skin: warm and dry, no rash Neuro:  Alert and Oriented x 3, Strength and sensation are intact Psych: euthymic mood, full affect  Wt Readings from Last 3 Encounters:  04/24/15 254 lb (115.214 kg)  04/17/15 251 lb 6.4 oz (114.034 kg)  11/26/14 260 lb (117.935 kg)      Studies/Labs Reviewed:   EKG:  EKG is not ordered today.   Recent Labs: 11/21/2014: ALT 29 11/27/2014: BUN 18; Creatinine, Ser 1.31*; Hemoglobin 13.4; Platelets 164; Potassium 4.2; Sodium 135   Lipid Panel    Component Value Date/Time   CHOL 189 04/04/2014 0802   CHOL 206* 05/10/2013 1037   TRIG 116.0  04/04/2014 0802   HDL 40.60 04/04/2014 0802   HDL 47 05/10/2013 1037   CHOLHDL 5 04/04/2014 0802   CHOLHDL 4.4 05/10/2013 1037   VLDL 23.2 04/04/2014 0802   LDLCALC 125* 04/04/2014 0802   LDLCALC 147* 05/10/2013 1037    Additional studies/ records  that were reviewed today include:  CPAP download    ASSESSMENT:    1. OSA (obstructive sleep apnea)   2. Essential hypertension   3. Obesity (BMI 30-39.9)   4. PAF (paroxysmal atrial fibrillation) (HCC)      PLAN:  In order of problems listed above:  1. OSA tolerating CPAP well.  Patient has been using and benefiting from CPAP use and will continue to benefit from therapy. His d/l showed an AHI of 0.4/hr on 14cm H2O and 100% compliance in using more than 4 hours nightly.  He will continue on current settings.   2. HTN - borderline controlled on current medical regimen.  Continue amlodipine/BB.  Last OV with Norma FredricksonLori Gerhardt NP his SBP was 108 and ACE I was changed to once daily.  I will increase Lisinopril to 30mg  daily.  I have asked him to check his BP daily for 1 week and call with results.   3. Obesity - I have encouraged him to get into a routine exercise program, limit portions and carbs. 4. PAF maintaining NSR.  He will continue on Xarelto.  I will get a copy of his last BMET and CBC from PCP.    Followup with me in 1 year    Medication Adjustments/Labs and Tests Ordered: Current medicines are reviewed at length with the patient today.  Concerns regarding medicines are outlined above.  Medication changes, Labs and Tests ordered today are listed in the Patient Instructions below. There are no Patient Instructions on file for this visit.   Isaiah [name], Isaiah Sirico R, MD  04/24/2015 10:22 AM    [facility] [address], [place], [place]  [address] Phone: [phone](336) [phone]; Fax: [phone]

## 2015-04-24 NOTE — Patient Instructions (Signed)
Medication Instructions:  1) INCREASE Lisinopril to 30mg  once daily  Labwork: None  Testing/Procedures: None  Follow-Up: Your physician wants you to follow-up in: 1 year with Dr. Mayford Knifeurner. You will receive a reminder letter in the mail two months in advance. If you don't receive a letter, please call our office to schedule the follow-up appointment.   Any Other Special Instructions Will Be Listed Below (If Applicable).   Your physician would like for you to check your blood pressure daily for one week and then call our office with those readings. [phone].  If you need a refill on your cardiac medications before your next appointment, please call your pharmacy.

## 2015-05-05 DIAGNOSIS — Z96641 Presence of right artificial hip joint: Secondary | ICD-10-CM | POA: Diagnosis not present

## 2015-05-05 DIAGNOSIS — Z471 Aftercare following joint replacement surgery: Secondary | ICD-10-CM | POA: Diagnosis not present

## 2015-05-07 ENCOUNTER — Encounter: Payer: Self-pay | Admitting: Cardiology

## 2015-05-13 ENCOUNTER — Encounter (HOSPITAL_COMMUNITY): Payer: Self-pay

## 2015-05-13 ENCOUNTER — Encounter (HOSPITAL_COMMUNITY)
Admission: RE | Admit: 2015-05-13 | Discharge: 2015-05-13 | Disposition: A | Discharge status: Home / Self Care | Payer: BLUE CROSS/BLUE SHIELD | Source: Ambulatory Visit | Attending: General Surgery | Admitting: General Surgery

## 2015-05-13 DIAGNOSIS — I10 Essential (primary) hypertension: Secondary | ICD-10-CM | POA: Diagnosis not present

## 2015-05-13 DIAGNOSIS — Z9989 Dependence on other enabling machines and devices: Secondary | ICD-10-CM | POA: Diagnosis not present

## 2015-05-13 DIAGNOSIS — K219 Gastro-esophageal reflux disease without esophagitis: Secondary | ICD-10-CM | POA: Diagnosis not present

## 2015-05-13 DIAGNOSIS — I4891 Unspecified atrial fibrillation: Secondary | ICD-10-CM | POA: Diagnosis not present

## 2015-05-13 DIAGNOSIS — E7800 Pure hypercholesterolemia, unspecified: Secondary | ICD-10-CM | POA: Diagnosis not present

## 2015-05-13 DIAGNOSIS — Z96641 Presence of right artificial hip joint: Secondary | ICD-10-CM | POA: Diagnosis not present

## 2015-05-13 DIAGNOSIS — Z7901 Long term (current) use of anticoagulants: Secondary | ICD-10-CM | POA: Diagnosis not present

## 2015-05-13 DIAGNOSIS — D6862 Lupus anticoagulant syndrome: Secondary | ICD-10-CM | POA: Diagnosis not present

## 2015-05-13 DIAGNOSIS — G4730 Sleep apnea, unspecified: Secondary | ICD-10-CM | POA: Diagnosis not present

## 2015-05-13 DIAGNOSIS — K429 Umbilical hernia without obstruction or gangrene: Secondary | ICD-10-CM | POA: Diagnosis not present

## 2015-05-13 LAB — CBC WITH DIFFERENTIAL/PLATELET
BASOS ABS: 0 10*3/uL (ref 0.0–0.1)
Basophils Relative: 0 %
EOS ABS: 0 10*3/uL (ref 0.0–0.7)
EOS PCT: 1 %
HEMATOCRIT: 44.1 % (ref 39.0–52.0)
Hemoglobin: 14 g/dL (ref 13.0–17.0)
LYMPHS ABS: 1.7 10*3/uL (ref 0.7–4.0)
Lymphocytes Relative: 33 %
MCH: 24.9 pg — ABNORMAL LOW (ref 26.0–34.0)
MCHC: 31.7 g/dL (ref 30.0–36.0)
MCV: 78.3 fL (ref 78.0–100.0)
MONO ABS: 0.3 10*3/uL (ref 0.1–1.0)
MONOS PCT: 7 %
Neutro Abs: 2.9 10*3/uL (ref 1.7–7.7)
Neutrophils Relative %: 59 %
Platelets: 238 10*3/uL (ref 150–400)
RBC: 5.63 MIL/uL (ref 4.22–5.81)
RDW: 15.8 % — ABNORMAL HIGH (ref 11.5–15.5)
WBC: 5 10*3/uL (ref 4.0–10.5)

## 2015-05-13 LAB — COMPREHENSIVE METABOLIC PANEL
ALT: 34 U/L (ref 17–63)
AST: 36 U/L (ref 15–41)
Albumin: 4.1 g/dL (ref 3.5–5.0)
Alkaline Phosphatase: 78 U/L (ref 38–126)
Anion gap: 9 (ref 5–15)
BILIRUBIN TOTAL: 0.8 mg/dL (ref 0.3–1.2)
BUN: 22 mg/dL — AB (ref 6–20)
CO2: 27 mmol/L (ref 22–32)
CREATININE: 1.51 mg/dL — AB (ref 0.61–1.24)
Calcium: 9.2 mg/dL (ref 8.9–10.3)
Chloride: 103 mmol/L (ref 101–111)
GFR calc Af Amer: 59 mL/min — ABNORMAL LOW (ref >60)
GFR, EST NON AFRICAN AMERICAN: 51 mL/min — AB (ref >60)
Glucose, Bld: 98 mg/dL (ref 65–99)
Potassium: 4.7 mmol/L (ref 3.5–5.1)
Sodium: 139 mmol/L (ref 135–145)
TOTAL PROTEIN: 7.4 g/dL (ref 6.5–8.1)

## 2015-05-13 LAB — PROTIME-INR
INR: 1.17 (ref 0.00–1.49)
PROTHROMBIN TIME: 15.1 s (ref 11.6–15.2)

## 2015-05-13 NOTE — Anesthesia Preprocedure Evaluation (Addendum)
Anesthesia Evaluation  Patient identified by MRN, date of birth, ID band Patient awake    Reviewed: Allergy & Precautions, H&P , NPO status , Patient's Chart, lab work & pertinent test results, reviewed documented beta blocker date and time   History of Anesthesia Complications (+) PONV  Airway Mallampati: II  TM Distance: >3 FB Neck ROM: full    Dental  (+) Dental Advisory Given, Caps All upper front are capped:   Pulmonary sleep apnea and Continuous Positive Airway Pressure Ventilation , PE   Pulmonary exam normal breath sounds clear to auscultation       Cardiovascular Exercise Tolerance: Good hypertension, Pt. on medications and Pt. on home beta blockers negative cardio ROS Normal cardiovascular exam+ dysrhythmias Atrial Fibrillation  Rhythm:regular Rate:Normal  Diastolic dysfunction grade1. No recurrance of AF since 2012. History of chest pain   Neuro/Psych negative neurological ROS  negative psych ROS   GI/Hepatic negative GI ROS, Neg liver ROS, GERD  Medicated and Controlled,  Endo/Other  negative endocrine ROSHypothyroidism   Renal/GU Renal diseasenegative Renal ROSCRT 1.51  negative genitourinary   Musculoskeletal   Abdominal   Peds  Hematology negative hematology ROS (+) Blood dyscrasia, , hypercoagulable due to lupus anticoagulant   Anesthesia Other Findings Lyme disease  Reproductive/Obstetrics negative OB ROS                            Anesthesia Physical Anesthesia Plan  ASA: III  Anesthesia Plan: General   Post-op Pain Management:    Induction: Intravenous  Airway Management Planned: Oral ETT  Additional Equipment:   Intra-op Plan:   Post-operative Plan: Extubation in OR  Informed Consent: I have reviewed the patients History and Physical, chart, labs and discussed the procedure including the risks, benefits and alternatives for the proposed anesthesia with the  patient or authorized representative who has indicated his/her understanding and acceptance.   Dental Advisory Given  Plan Discussed with: CRNA  Anesthesia Plan Comments:         Anesthesia Quick Evaluation

## 2015-05-13 NOTE — Progress Notes (Signed)
ekg 12-01-14 epic Emelda Fearcario lov dr turner 04-24-15 dr turner epic

## 2015-05-13 NOTE — Patient Instructions (Addendum)
Isaiah [name], [name]  05/13/2015   Your procedure is scheduled on: 05-14-15  [facility]  [facility]  [facility]vators to 3rd floor to  Short Stay Center at 645 AM.  Call this number if you have problems the morning of surgery [phone]   Remember: ONLY 1 PERSON MAY GO WITH YOU TO SHORT STAY TO GET  READY MORNING OF YOUR SURGERY.  Do not eat food or drink liquids :After Midnight.  Bring cpap mask and tubing   Take these medicines the morning of surgery with A SIP OF WATER: ALPRAZOLAM, AMLODIPINE, METOPROLOL, OMEPRAZOLE, SYNTHROID,  PROPAFENONE (RYTHMOL)                               You may not have any metal on your body including hair pins and              piercings  Do not wear jewelry, make-up, lotions, powders or perfumes, deodorant             Do not wear nail polish.  Do not shave  48 hours prior to surgery.              Men may shave face and neck.              Bring cpap mask and tubing   Do not bring valuables to the hospital. [facility] IS NOT             RESPONSIBLE   FOR VALUABLES.  Contacts, dentures or bridgework may not be worn into surgery.  Leave suitcase in the car. After surgery it may be brought to your room.     Patients discharged the day of surgery will not be allowed to drive home.  Name and phone number of your driver:  Special Instructions: N/A              Please read over the following fact sheets you were given: _____________________________________________________________________             [facility] Pre[facility] Before surgery, you can play an important role.  Because skin is not sterile, your skin needs to be as free of germs as possible.  You can reduce the number of germs on your skin by washing with CHG (chlorahexidine gluconate) soap before surgery.  CHG is an antiseptic cleaner which kills germs and bonds with the skin to continue killing germs even after washing. Please DO NOT use if  you have an allergy to CHG or antibacterial soaps.  If your skin becomes reddened/irritated stop using the CHG and inform your nurse when you arrive at Short Stay. Do not shave (including legs and underarms) for at least 48 hours prior to the first CHG shower.  You may shave your face/neck. Please follow these instructions carefully:  1.  Shower with CHG Soap the night before surgery and the  morning of Surgery.  2.  If you choose to wash your hair, wash your hair first as usual with your  normal  shampoo.  3.  After you shampoo, rinse your hair and body thoroughly to remove the  shampoo.                           4.  Use CHG as  you would any other liquid soap.  You can apply chg directly  to the skin and wash                       Gently with a scrungie or clean washcloth.  5.  Apply the CHG Soap to your body ONLY FROM THE NECK DOWN.   Do not use on face/ open                           Wound or open sores. Avoid contact with eyes, ears mouth and genitals (private parts).                       Wash face,  Genitals (private parts) with your normal soap.             6.  Wash thoroughly, paying special attention to the area where your surgery  will be performed.  7.  Thoroughly rinse your body with warm water from the neck down.  8.  DO NOT shower/wash with your normal soap after using and rinsing off  the CHG Soap.                9.  Pat yourself dry with a clean towel.            10.  Wear clean pajamas.            11.  Place clean sheets on your bed the night of your first shower and do not  sleep with pets. Day of Surgery : Do not apply any lotions/deodorants the morning of surgery.  Please wear clean clothes to the hospital/surgery center.  FAILURE TO FOLLOW THESE INSTRUCTIONS MAY RESULT IN THE CANCELLATION OF YOUR SURGERY PATIENT SIGNATURE_________________________________  NURSE  SIGNATURE__________________________________  ________________________________________________________________________

## 2015-05-14 ENCOUNTER — Ambulatory Visit (HOSPITAL_COMMUNITY): Payer: BLUE CROSS/BLUE SHIELD | Admitting: Anesthesiology

## 2015-05-14 ENCOUNTER — Ambulatory Visit (HOSPITAL_COMMUNITY)
Admission: RE | Admit: 2015-05-14 | Discharge: 2015-05-14 | Disposition: A | Discharge status: Home / Self Care | Payer: BLUE CROSS/BLUE SHIELD | Source: Ambulatory Visit | Attending: General Surgery | Admitting: General Surgery

## 2015-05-14 ENCOUNTER — Encounter (HOSPITAL_COMMUNITY): Payer: Self-pay | Admitting: *Deleted

## 2015-05-14 ENCOUNTER — Encounter (HOSPITAL_COMMUNITY)
Admission: RE | Disposition: A | Discharge status: Home / Self Care | Payer: Self-pay | Source: Ambulatory Visit | Attending: General Surgery

## 2015-05-14 DIAGNOSIS — K429 Umbilical hernia without obstruction or gangrene: Secondary | ICD-10-CM | POA: Diagnosis not present

## 2015-05-14 DIAGNOSIS — D6862 Lupus anticoagulant syndrome: Secondary | ICD-10-CM | POA: Insufficient documentation

## 2015-05-14 DIAGNOSIS — I4891 Unspecified atrial fibrillation: Secondary | ICD-10-CM | POA: Insufficient documentation

## 2015-05-14 DIAGNOSIS — I10 Essential (primary) hypertension: Secondary | ICD-10-CM | POA: Insufficient documentation

## 2015-05-14 DIAGNOSIS — G4730 Sleep apnea, unspecified: Secondary | ICD-10-CM | POA: Insufficient documentation

## 2015-05-14 DIAGNOSIS — Z7901 Long term (current) use of anticoagulants: Secondary | ICD-10-CM | POA: Diagnosis not present

## 2015-05-14 DIAGNOSIS — Z9989 Dependence on other enabling machines and devices: Secondary | ICD-10-CM | POA: Insufficient documentation

## 2015-05-14 DIAGNOSIS — Z96641 Presence of right artificial hip joint: Secondary | ICD-10-CM | POA: Insufficient documentation

## 2015-05-14 DIAGNOSIS — N189 Chronic kidney disease, unspecified: Secondary | ICD-10-CM | POA: Diagnosis not present

## 2015-05-14 DIAGNOSIS — I129 Hypertensive chronic kidney disease with stage 1 through stage 4 chronic kidney disease, or unspecified chronic kidney disease: Secondary | ICD-10-CM | POA: Diagnosis not present

## 2015-05-14 DIAGNOSIS — K219 Gastro-esophageal reflux disease without esophagitis: Secondary | ICD-10-CM | POA: Insufficient documentation

## 2015-05-14 DIAGNOSIS — E7800 Pure hypercholesterolemia, unspecified: Secondary | ICD-10-CM | POA: Insufficient documentation

## 2015-05-14 HISTORY — PX: UMBILICAL HERNIA REPAIR: SHX196

## 2015-05-14 HISTORY — PX: INSERTION OF MESH: SHX5868

## 2015-05-14 SURGERY — REPAIR, HERNIA, UMBILICAL, ADULT
Anesthesia: General | Site: Abdomen

## 2015-05-14 MED ORDER — ROCURONIUM BROMIDE 100 MG/10ML IV SOLN
INTRAVENOUS | Status: AC
  Filled 2015-05-14: qty 1

## 2015-05-14 MED ORDER — PROPOFOL 10 MG/ML IV BOLUS
INTRAVENOUS | Status: DC | PRN
  Administered 2015-05-14: 200 mg via INTRAVENOUS

## 2015-05-14 MED ORDER — LIDOCAINE HCL (CARDIAC) 20 MG/ML IV SOLN
INTRAVENOUS | Status: DC | PRN
  Administered 2015-05-14: 100 mg via INTRAVENOUS

## 2015-05-14 MED ORDER — LIDOCAINE HCL (CARDIAC) 20 MG/ML IV SOLN
INTRAVENOUS | Status: AC
  Filled 2015-05-14: qty 5

## 2015-05-14 MED ORDER — HYDROCODONE-ACETAMINOPHEN 5-325 MG PO TABS: 1.0000 | ORAL_TABLET | ORAL | Status: DC | PRN

## 2015-05-14 MED ORDER — ROCURONIUM BROMIDE 100 MG/10ML IV SOLN
INTRAVENOUS | Status: DC | PRN
  Administered 2015-05-14: 50 mg via INTRAVENOUS
  Administered 2015-05-14: 10 mg via INTRAVENOUS

## 2015-05-14 MED ORDER — SUGAMMADEX SODIUM 200 MG/2ML IV SOLN
INTRAVENOUS | Status: AC
  Filled 2015-05-14: qty 2

## 2015-05-14 MED ORDER — FENTANYL CITRATE (PF) 250 MCG/5ML IJ SOLN
INTRAMUSCULAR | Status: AC
  Filled 2015-05-14: qty 5

## 2015-05-14 MED ORDER — ONDANSETRON HCL 4 MG/2ML IJ SOLN
INTRAMUSCULAR | Status: DC | PRN
  Administered 2015-05-14: 4 mg via INTRAVENOUS

## 2015-05-14 MED ORDER — HYDROMORPHONE HCL 1 MG/ML IJ SOLN: 0.2500 mg | INTRAMUSCULAR | Status: DC | PRN

## 2015-05-14 MED ORDER — EPHEDRINE SULFATE 50 MG/ML IJ SOLN
INTRAMUSCULAR | Status: DC | PRN
  Administered 2015-05-14: 10 mg via INTRAVENOUS
  Administered 2015-05-14: 5 mg via INTRAVENOUS
  Administered 2015-05-14 (×2): 10 mg via INTRAVENOUS
  Administered 2015-05-14: 5 mg via INTRAVENOUS
  Administered 2015-05-14: 10 mg via INTRAVENOUS

## 2015-05-14 MED ORDER — MIDAZOLAM HCL 5 MG/5ML IJ SOLN
INTRAMUSCULAR | Status: DC | PRN
  Administered 2015-05-14: 2 mg via INTRAVENOUS

## 2015-05-14 MED ORDER — EPHEDRINE SULFATE 50 MG/ML IJ SOLN
INTRAMUSCULAR | Status: AC
  Filled 2015-05-14: qty 1

## 2015-05-14 MED ORDER — DEXAMETHASONE SODIUM PHOSPHATE 10 MG/ML IJ SOLN
INTRAMUSCULAR | Status: AC
  Filled 2015-05-14: qty 1

## 2015-05-14 MED ORDER — ACETAMINOPHEN 650 MG RE SUPP
650.0000 mg | RECTAL | Status: DC | PRN
  Filled 2015-05-14: qty 1

## 2015-05-14 MED ORDER — CEFAZOLIN SODIUM-DEXTROSE 2-4 GM/100ML-% IV SOLN
2.0000 g | INTRAVENOUS | Status: AC
  Administered 2015-05-14: 2 g via INTRAVENOUS

## 2015-05-14 MED ORDER — ACETAMINOPHEN 325 MG PO TABS: 650.0000 mg | ORAL_TABLET | ORAL | Status: DC | PRN

## 2015-05-14 MED ORDER — SODIUM CHLORIDE 0.9 % IJ SOLN
INTRAMUSCULAR | Status: AC
  Filled 2015-05-14: qty 10

## 2015-05-14 MED ORDER — LACTATED RINGERS IV SOLN
INTRAVENOUS | Status: DC
  Administered 2015-05-14: 09:00:00 via INTRAVENOUS
  Administered 2015-05-14: 1000 mL via INTRAVENOUS

## 2015-05-14 MED ORDER — 0.9 % SODIUM CHLORIDE (POUR BTL) OPTIME
TOPICAL | Status: DC | PRN
  Administered 2015-05-14: 1000 mL

## 2015-05-14 MED ORDER — BUPIVACAINE-EPINEPHRINE (PF) 0.5% -1:200000 IJ SOLN
INTRAMUSCULAR | Status: AC
  Filled 2015-05-14: qty 30

## 2015-05-14 MED ORDER — MIDAZOLAM HCL 2 MG/2ML IJ SOLN
INTRAMUSCULAR | Status: AC
  Filled 2015-05-14: qty 2

## 2015-05-14 MED ORDER — FENTANYL CITRATE (PF) 100 MCG/2ML IJ SOLN
INTRAMUSCULAR | Status: DC | PRN
  Administered 2015-05-14: 100 ug via INTRAVENOUS

## 2015-05-14 MED ORDER — LACTATED RINGERS IV SOLN: INTRAVENOUS | Status: DC

## 2015-05-14 MED ORDER — ONDANSETRON HCL 4 MG/2ML IJ SOLN
INTRAMUSCULAR | Status: AC
  Filled 2015-05-14: qty 2

## 2015-05-14 MED ORDER — OXYCODONE HCL 5 MG PO TABS
5.0000 mg | ORAL_TABLET | ORAL | Status: DC | PRN
  Administered 2015-05-14: 5 mg via ORAL
  Filled 2015-05-14: qty 1

## 2015-05-14 MED ORDER — SCOPOLAMINE 1 MG/3DAYS TD PT72
1.0000 | MEDICATED_PATCH | TRANSDERMAL | Status: DC
  Administered 2015-05-14: 1.5 mg via TRANSDERMAL
  Filled 2015-05-14: qty 1

## 2015-05-14 MED ORDER — SODIUM CHLORIDE 0.9% FLUSH: 3.0000 mL | INTRAVENOUS | Status: DC | PRN

## 2015-05-14 MED ORDER — PROPOFOL 10 MG/ML IV BOLUS
INTRAVENOUS | Status: AC
  Filled 2015-05-14: qty 20

## 2015-05-14 MED ORDER — SUGAMMADEX SODIUM 200 MG/2ML IV SOLN
INTRAVENOUS | Status: DC | PRN
  Administered 2015-05-14: 200 mg via INTRAVENOUS

## 2015-05-14 MED ORDER — CEFAZOLIN SODIUM-DEXTROSE 2-4 GM/100ML-% IV SOLN
INTRAVENOUS | Status: AC
  Filled 2015-05-14: qty 100

## 2015-05-14 MED ORDER — MORPHINE SULFATE (PF) 10 MG/ML IV SOLN: 2.0000 mg | INTRAVENOUS | Status: DC | PRN

## 2015-05-14 MED ORDER — DEXAMETHASONE SODIUM PHOSPHATE 10 MG/ML IJ SOLN
INTRAMUSCULAR | Status: DC | PRN
  Administered 2015-05-14: 10 mg via INTRAVENOUS

## 2015-05-14 MED ORDER — BUPIVACAINE-EPINEPHRINE 0.5% -1:200000 IJ SOLN
INTRAMUSCULAR | Status: DC | PRN
  Administered 2015-05-14: 10 mL

## 2015-05-14 SURGICAL SUPPLY — 35 items
APL SKNCLS STERI-STRIP NONHPOA (GAUZE/BANDAGES/DRESSINGS) ×1
BENZOIN TINCTURE PRP APPL 2/3 (GAUZE/BANDAGES/DRESSINGS) ×2 IMPLANT
BLADE HEX COATED 2.75 (ELECTRODE) ×1 IMPLANT
BLADE SURG 15 STRL LF DISP TIS (BLADE) ×1 IMPLANT
BLADE SURG 15 STRL SS (BLADE) ×2
CHLORAPREP W/TINT 26ML (MISCELLANEOUS) ×2 IMPLANT
COVER SURGICAL LIGHT HANDLE (MISCELLANEOUS) ×2 IMPLANT
DECANTER SPIKE VIAL GLASS SM (MISCELLANEOUS) IMPLANT
DRAPE INCISE IOBAN 66X45 STRL (DRAPES) ×2 IMPLANT
DRAPE LAPAROTOMY T 102X78X121 (DRAPES) ×2 IMPLANT
DRSG TEGADERM 4X4.75 (GAUZE/BANDAGES/DRESSINGS) ×2 IMPLANT
ELECT PENCIL ROCKER SW 15FT (MISCELLANEOUS) ×2 IMPLANT
ELECT REM PT RETURN 9FT ADLT (ELECTROSURGICAL) ×2
ELECTRODE REM PT RTRN 9FT ADLT (ELECTROSURGICAL) ×1 IMPLANT
GAUZE SPONGE 4X4 12PLY STRL (GAUZE/BANDAGES/DRESSINGS) ×2 IMPLANT
GLOVE ECLIPSE 8.0 STRL XLNG CF (GLOVE) ×2 IMPLANT
GLOVE INDICATOR 8.0 STRL GRN (GLOVE) ×2 IMPLANT
GOWN STRL REUS W/TWL XL LVL3 (GOWN DISPOSABLE) ×4 IMPLANT
KIT BASIN OR (CUSTOM PROCEDURE TRAY) ×2 IMPLANT
MESH HERNIA 3X6 (Mesh General) ×2 IMPLANT
NEEDLE HYPO 25X1 1.5 SAFETY (NEEDLE) ×2 IMPLANT
PACK BASIC VI WITH GOWN DISP (CUSTOM PROCEDURE TRAY) ×2 IMPLANT
SOL PREP POV-IOD 4OZ 10% (MISCELLANEOUS) ×2 IMPLANT
SPONGE LAP 4X18 X RAY DECT (DISPOSABLE) ×2 IMPLANT
STRIP CLOSURE SKIN 1/2X4 (GAUZE/BANDAGES/DRESSINGS) ×2 IMPLANT
SUT MNCRL AB 4-0 PS2 18 (SUTURE) ×2 IMPLANT
SUT NOVA NAB DX-16 0-1 5-0 T12 (SUTURE) ×2 IMPLANT
SUT PROLENE 0 CT 2 (SUTURE) ×1 IMPLANT
SUT PROLENE 1 CT 1 30 (SUTURE) IMPLANT
SUT VIC AB 3-0 SH 27 (SUTURE) ×2
SUT VIC AB 3-0 SH 27XBRD (SUTURE) ×1 IMPLANT
SYR BULB IRRIGATION 50ML (SYRINGE) IMPLANT
SYR CONTROL 10ML LL (SYRINGE) ×2 IMPLANT
TOWEL OR 17X26 10 PK STRL BLUE (TOWEL DISPOSABLE) ×2 IMPLANT
YANKAUER SUCT BULB TIP 10FT TU (MISCELLANEOUS) IMPLANT

## 2015-05-14 NOTE — H&P (View-Only) (Signed)
Isaiah [name]. Gagner [name] 04/17/2015 12:08 PM Location: [facility] Patient #: [id number] DOB: June 19, 1960 Divorced / Language: [place] / Race: White Male  History of Present Illness Isaiah Pollack [name]; 04/17/2015 12:23 PM) The patient is a 55 year old male.   Note:He presents today for a preop visit for umbilical hernia repair. I saw him for this October 2016. He underwent laparoscopic repair of left inguinal hernia with mesh 05/25/2010. About a 1.5 years ago he noted a small bulge that has been getting larger and is sensitive to the touch. He had a right hip replacement by Dr. Lequita [name] November 9 and is doing well from that. He is on chronic anticoagulation therapy with Xarelto because of lupus anticoagulant deficiency. He has sleep apnea and is using his CPAP machine which makes him feel much better.  Other Problems Isaiah Clack, RN, [name]; 04/17/2015 12:08 PM) Atrial Fibrillation Gastroesophageal Reflux Disease High blood pressure Hypercholesterolemia Inguinal Hernia Thyroid Disease Umbilical Hernia Repair  Past Surgical History Isaiah Clack, RN, [name]; 04/17/2015 12:08 PM) Hip Surgery Right. Laparoscopic Inguinal Hernia Surgery Left. Open Inguinal Hernia Surgery Right. Oral Surgery Shoulder Surgery Left.  Diagnostic Studies History Isaiah Clack, RN, [name]; 04/17/2015 12:08 PM) Colonoscopy 1-5 years ago  Medication History Isaiah Clack, RN, [name]; 04/17/2015 12:13 PM) Zetia (  Tablet, Oral daily) Active. Pepcid (  Tablet, Oral daily) Active. Medications Reconciled  Social History Isaiah Clack, RN, [name]; 04/17/2015 12:08 PM) Alcohol use Moderate alcohol use. No caffeine use No drug use Tobacco use Never smoker.  Family History (Isaiah Clack, RN, [name]; 04/17/2015 12:08 PM) Breast Cancer Mother. Hypertension Brother, Father, Mother. Migraine Headache Father. Prostate Cancer Brother.     Review of Systems Isaiah [name]; 04/17/2015 12:08 PM) General Not Present- Appetite Loss, Chills, Fatigue, Fever, Night Sweats, Weight Gain and Weight Loss. Skin Not Present- Change in Wart/Mole, Dryness, Hives, Jaundice, New Lesions, Non-Healing Wounds, Rash and Ulcer. HEENT Not Present- Earache, Hearing Loss, Hoarseness, Nose Bleed, Oral Ulcers, Ringing in the Ears, Seasonal Allergies, Sinus Pain, Sore Throat, Visual Disturbances, Wears glasses/contact lenses and Yellow Eyes. Respiratory Not Present- Bloody sputum, Chronic Cough, Difficulty Breathing, Snoring and Wheezing. Breast Not Present- Breast Mass, Breast Pain, Nipple Discharge and Skin Changes. Cardiovascular Not Present- Chest Pain, Difficulty Breathing Lying Down, Leg Cramps, Palpitations, Rapid Heart Rate, Shortness of Breath and Swelling of Extremities. Gastrointestinal Not Present- Abdominal Pain, Bloating, Bloody Stool, Change in Bowel Habits, Chronic diarrhea, Constipation, Difficulty Swallowing, Excessive gas, Gets full quickly at meals, Hemorrhoids, Indigestion, Nausea, Rectal Pain and Vomiting. Male Genitourinary Not Present- Blood in Urine, Change in Urinary Stream, Frequency, Impotence, Nocturia, Painful Urination, Urgency and Urine Leakage. Musculoskeletal Not Present- Back Pain, Joint Pain, Joint Stiffness, Muscle Pain, Muscle Weakness and Swelling of Extremities. Neurological Not Present- Decreased Memory, Fainting, Headaches, Numbness, Seizures, Tingling, Tremor, Trouble walking and Weakness. Psychiatric Not Present- Anxiety, Bipolar, Change in Sleep Pattern, Depression, Fearful and Frequent crying. Endocrine Not Present- Cold Intolerance, Excessive Hunger, Hair Changes, Heat Intolerance, Hot flashes and New Diabetes. Hematology Present- Excessive bleeding. Not Present- Easy Bruising, Gland problems, HIV and Persistent Infections.  Vitals Isaiah [name]; 04/17/2015 12:09 PM) 04/17/2015 12:09 PM Weight: 250.2 lb Temp.:  97.76F(Oral)  [name]: 78 (Regular)  BP: 158/100 (Sitting, Left Arm, Standard)      Physical Exam Isaiah Pollack [name]; 04/17/2015 12:41 PM)  The physical exam findings are as follows: Note:General: WDWN in NAD. Pleasant and cooperative.  HEENT: [place]/AT, no facial masses  EYES: no icterus  CV: RRR, no murmur, no JVD.  CHEST: Breath sounds equal and clear. Respirations nonlabored.  ABDOMEN: Soft, small lower abdominal scars, epigastric diastasis recti, reducible umbilical bulge with palpable fascial defect  NEUROLOGIC: Alert and oriented, answers questions appropriately.  PSYCHIATRIC: Normal mood, affect , and behavior.    Assessment & Plan Isaiah [name]; 04/17/2015 12:42 PM)  UMBILICAL HERNIA WITHOUT OBSTRUCTION AND WITHOUT GANGRENE (K42.9) Impression: Symptomatic at times.  Plan: Open umbilical hernia repair with mesh. Hold Xarelto for 48 hours prior to surgery. I have discussed the procedure, risks, and aftercare. Risks include but are not limited to bleeding, infection, wound healing problems, anesthesia, recurrence, injury to intra-abdominal organs, cosmetic deformity. He seems to understand and would like to proceed.  Isaiah Peaceodd Johm Pfannenstiel, [name]

## 2015-05-14 NOTE — Transfer of Care (Signed)
Immediate Anesthesia Transfer of Care Note  Patient: Isaiah [name], [name]  Procedure(s) Performed: Procedure(s): HERNIA REPAIR UMBILICAL ADULT WITH MESH  (N/A) INSERTION OF MESH (N/A)  Patient Location: PACU  Anesthesia Type:General  Level of Consciousness:  sedated, patient cooperative and responds to stimulation  Airway & Oxygen Therapy:Patient Spontanous Breathing and Patient connected to face mask oxgen  Post-op Assessment:  Report given to PACU RN and Post -op Vital signs reviewed and stable  Post vital signs:  Reviewed and stable  Last Vitals:  Filed Vitals:   05/14/15 0708  BP: 152/86  Pulse: 81  Temp: 36.7 C  Resp: 16    Complications: No apparent anesthesia complications

## 2015-05-14 NOTE — Op Note (Signed)
Operative Note:  Umbilical Hernia Repair  Preoperative diagnosis: Umbilical hernia  Postoperative diagnosis: Same  Procedure: Umbilical hernia repair with mesh.  Surgeon: Avel Peaceodd Tannie Koskela M.D.  Anesthesia: General plus Marcaine local   Indication:This is an active 55 year old male who developed a symptomatic umbilical bulge.  He has a reducible umbilical hernia by exam and presents for elective repair.  He has been off his Xarelto for 48 hours.  Technique: He was seen in the holding room and then brought to the operating room, placed supine on the operating table, and a general anesthetic was given.  The hair in the periumbilical area was clipped as was necessary.  The periumbilical area was sterilely prepped and draped.  Marcaine solution was infiltrated superficially and deep in the periumbilical area. A subumbilical transverse incision was made through the skin and subcutaneous tissue until the fascia was identified. The subcutaneous tissue was mobilized free from the fascia inferiorly and laterally. The umbilical stalk was mobilized and dissected free from the fascia exposing the underlying hernia defect. The hernia sac and tissue within it were reduced back into the abdominal cavity.  The subcutaneous tissue was freed from the fascia for 3-4 cm around the primary defect. The primary defect was then closed with interrupted 0 Novofil sutures. The sutures were left long.  A piece of polypropylene mesh was brought into the field. The primary repair sutures were threaded up through the mesh and tied down anchoring the mesh directly over the primary repair. The periphery of the mesh was then anchored to the fascia with a running 0 Prolene suture to allow for 3-4 cm of mesh overlap. The excess mesh was trimmed and removed.  Hemostasis was adequate at this time. The umbilicus was reimplanted onto the mesh and fascia with 3-0 Vicryl suture. The subcutaneous tissue was closed over the mesh with a  running 3-0 Vicryl suture. The skin was closed with a 4-0 Monocryl subcuticular stitch. Steri-Strips and sterile dressings were applied.  He tolerated the procedure well without any apparent complications and was taken to the recovery room in satisfactory condition.

## 2015-05-14 NOTE — Interval H&P Note (Signed)
History and Physical Interval Note:  05/14/2015 8:26 AM  Isaiah RavensBlake S [name], [name]  has presented today for surgery, with the diagnosis of Umbilical hernia   The various methods of treatment have been discussed with the patient and family. After consideration of risks, benefits and other options for treatment, the patient has consented to  Procedure(s): HERNIA REPAIR UMBILICAL ADULT WITH MESH  (N/A) INSERTION OF MESH (N/A) as a surgical intervention .  The patient's history has been reviewed, patient examined, no change in status, stable for surgery.  I have reviewed the patient's chart and labs.  Questions were answered to the patient's satisfaction.     Rondalyn Belford JShela Commons

## 2015-05-14 NOTE — Anesthesia Postprocedure Evaluation (Signed)
Anesthesia Post Note  Patient: Isaiah [name], [name]  Procedure(s) Performed: Procedure(s) (LRB): HERNIA REPAIR UMBILICAL ADULT WITH MESH  (N/A) INSERTION OF MESH (N/A)  Patient location during evaluation: PACU Anesthesia Type: General Level of consciousness: awake and alert Pain management: pain level controlled Vital Signs Assessment: post-procedure vital signs reviewed and stable Respiratory status: spontaneous breathing, nonlabored ventilation, respiratory function stable and patient connected to nasal cannula oxygen Cardiovascular status: blood pressure returned to baseline and stable Postop Assessment: no signs of nausea or vomiting Anesthetic complications: no    Last Vitals:  Filed Vitals:   05/14/15 1100 05/14/15 1128  BP:  115/82  Pulse: 71 65  Temp: 36.7 C 36.7 C  Resp: 14     Last Pain:  Filed Vitals:   05/14/15 1138  PainSc: 2                  Breauna Mazzeo L

## 2015-05-14 NOTE — Discharge Instructions (Addendum)
[facility] _______[facility]  UMBILICAL HERNIA REPAIR: POST OP INSTRUCTIONS  Always review your discharge instruction sheet given to you by the facility where your surgery was performed. IF YOU HAVE DISABILITY OR FAMILY LEAVE FORMS, YOU MUST BRING THEM TO THE OFFICE FOR PROCESSING.   DO NOT GIVE THEM TO YOUR DOCTOR.  1. A  prescription for pain medication may be given to you upon discharge.  Take your pain medication as prescribed, if needed.  If narcotic pain medicine is not needed, then you may take acetaminophen (Tylenol) or ibuprofen (Advil) as needed. 2. Take your usually prescribed medications unless otherwise directed. 3. If you need a refill on your pain medication, please contact your pharmacy.  They will contact our office to request authorization. Prescriptions will not be filled after 5 pm or on week-ends. 4. You should follow a light diet the first 24 hours after arrival home, such as soup and crackers, etc.  Be sure to include lots of fluids daily.  Resume your normal diet the day after surgery. 5. Most patients will experience some swelling and bruising around the umbilicus.  Ice packs and reclining will help.  Swelling and bruising can take several days to resolve.  6. It is common to experience some constipation if taking pain medication after surgery.  Increasing fluid intake and taking a stool softener (such as Colace) will usually help or prevent this problem from occurring.  A mild laxative (Milk of Magnesia or Miralax) should be taken according to package directions if there are no bowel movements after 48 hours. 7. Unless discharge instructions indicate otherwise, you may remove your bandages 72 hours after surgery, and you may shower at that time.  You may have steri-strips (small skin tapes) in place directly over the incision.  These strips should be left on the skin until they fall off.  If your surgeon used skin glue on the incision, you may shower in 24 hours.   The glue will flake off over the next 2-3 weeks.  Any sutures or staples will be removed at the office during your follow-up visit. 8. ACTIVITIES:  You may resume regular (light) daily activities beginning the next day--such as daily self-care, walking, climbing stairs--gradually increasing activities as tolerated.  You may have sexual intercourse when it is comfortable.  Refrain from any heavy lifting or straining-nothing over 10 pounds for 6 weeks. a. You may drive when you are no longer taking prescription pain medication, you can comfortably wear a seatbelt, and you can safely maneuver your car and apply brakes. b. RETURN TO WORK:   Very light work in 5-7 days.  Full duty in 6 weeks if pain-free.__________________________________________________________ 9. You should see your doctor in the office for a follow-up appointment approximately 2-3 weeks after your surgery.  Make sure that you call for this appointment within a day or two after you arrive home to insure a convenient appointment time. 10. OTHER INSTRUCTIONS:  ____Resume Xarelto tomorrow night.______________________________________________________________________________________________________________________________________________________________________________________  WHEN TO CALL YOUR DOCTOR: 1. Fever over 101.0 2. Inability to urinate 3. Nausea and/or vomiting 4. Extreme swelling or bruising 5. Continued bleeding from incision. 6. Increased pain, redness, or drainage from the incision  The clinic staff is available to answer your questions during regular business hours.  Please dont hesitate to call and ask to speak to one of the nurses for clinical concerns.  If you have a medical emergency, go to the nearest emergency room or call 911.  A surgeon from [facility] is always on call at the hospital   [address], [place], [place]  [address] ?  [address] [address], [place], [place]   [address] [phone]  ? [phone] ? FAX [phone] Web site: [email or web address]  General Anesthesia, Adult, Care After Refer to this sheet in the next few weeks. These instructions provide you with information on caring for yourself after your procedure. Your health care provider may also give you more specific instructions. Your treatment has been planned according to current medical practices, but problems sometimes occur. Call your health care provider if you have any problems or questions after your procedure. WHAT TO EXPECT AFTER THE PROCEDURE After the procedure, it is typical to experience:  Sleepiness.  Nausea and vomiting. HOME CARE INSTRUCTIONS  For the first 24 hours after general anesthesia:  Have a responsible person with you.  Do not drive a car. If you are alone, do not take public transportation.  Do not drink alcohol.  Do not take medicine that has not been prescribed by your health care provider.  Do not sign important papers or make important decisions.  You may resume a normal diet and activities as directed by your health care provider.  Change bandages (dressings) as directed.  If you have questions or problems that seem related to general anesthesia, call the hospital and ask for the anesthetist or anesthesiologist on call. SEEK MEDICAL CARE IF:  You have nausea and vomiting that continue the day after anesthesia.  You develop a rash. SEEK IMMEDIATE MEDICAL CARE IF:   You have difficulty breathing.  You have chest pain.  You have any allergic problems.   This information is not intended to replace advice given to you by your health care provider. Make sure you discuss any questions you have with your health care provider.   Document Released: 04/11/2000 Document Revised: 01/24/2014 Document Reviewed: 05/04/2011 Elsevier Interactive Patient Education [employer]

## 2015-05-14 NOTE — Anesthesia Procedure Notes (Signed)
Procedure Name: Intubation Date/Time: 05/14/2015 8:52 AM Performed by: Paris LoreBLANTON, Shadae Reino M Pre-anesthesia Checklist: Patient identified, Emergency Drugs available, Suction available and Patient being monitored Patient Re-evaluated:Patient Re-evaluated prior to inductionOxygen Delivery Method: Circle System Utilized Preoxygenation: Pre-oxygenation with 100% oxygen Intubation Type: IV induction Ventilation: Mask ventilation with difficulty and Two handed mask ventilation required Laryngoscope Size: Mac and 4 Grade View: Grade I Tube type: Oral Number of attempts: 1 Airway Equipment and Method: Stylet Placement Confirmation: ETT inserted through vocal cords under direct vision,  positive ETCO2 and breath sounds checked- equal and bilateral Secured at: 23 cm Tube secured with: Tape Dental Injury: Teeth and Oropharynx as per pre-operative assessment

## 2015-06-08 DIAGNOSIS — H524 Presbyopia: Secondary | ICD-10-CM | POA: Diagnosis not present

## 2015-06-19 ENCOUNTER — Ambulatory Visit (INDEPENDENT_AMBULATORY_CARE_PROVIDER_SITE_OTHER): Payer: BLUE CROSS/BLUE SHIELD | Admitting: Cardiovascular Disease

## 2015-06-19 ENCOUNTER — Encounter: Payer: Self-pay | Admitting: Cardiovascular Disease

## 2015-06-19 VITALS — BP 120/74 | HR 80 | Ht 75.5 in | Wt 253.1 lb

## 2015-06-19 DIAGNOSIS — I10 Essential (primary) hypertension: Secondary | ICD-10-CM | POA: Diagnosis not present

## 2015-06-19 DIAGNOSIS — I2699 Other pulmonary embolism without acute cor pulmonale: Secondary | ICD-10-CM | POA: Diagnosis not present

## 2015-06-19 DIAGNOSIS — I480 Paroxysmal atrial fibrillation: Secondary | ICD-10-CM

## 2015-06-19 NOTE — Progress Notes (Signed)
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 Yevonne Pax.     Chief Complaint  Patient presents with  . Hypertension   Problem list 1. History of recurrent pulmonary embolus 2. Hypercoagulability 3. Hypertension 4. Osteoarthritis-status post hip replacements 5.  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 Former patient of Dr. Yevonne Pax.   He has a history of prior PE and found to have a hypercoagulability state with abnormal lupus anticoagulant - he is committed to lifelong anticoagulation and is on Xarelto. He has had frequent DVT, PAF, hypothyroidism, HLD and HTN.  He has sleep apnea and is on CPAP - followed by Dr. Mayford Knife.   In January 2014 the patient experienced some chest tightness and underwent a treadmill Myoview stress test which showed no evidence of ischemia and his ejection fraction was 53% with no wall motion abnormalities.   Last seen in November. Cardiac status was stable.   Comes back today. Here alone. He is doing well. He had his hip replacement back in November - did well. Back on track with losing weight - says he is down 20 pounds. BP is lower. No chest pain. Breathing is good. Rhythm is good. Asking about reduction of medicines. His significant other has been diagnosed with breast cancer and he has been helping with her.    June 19, 2015:  Isaiah [name] is seen today for initial visit.   He is a former patient of [facility]. Has hx of PAF - none in the past 6-8 years.    Seems to be triggered by large doses of caffiene.  Has cardioverted on 1 occasion but usually he will convert back into NSR after a day or so .   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   No weight  lifting due to a recent hernia repair .      Past Medical History  Diagnosis Date  . Hypertension   . Hyperlipidemia   . Situational stress   . Diastolic dysfunction     Grade I per echo in 2/12  . PAF (paroxysmal atrial fibrillation) (HCC)     no recurrence since Feb 2012  . DVT (deep venous thrombosis) (HCC) 2000's    "both legs in the calves; left went all the way up to my groin" (09/17/2012)  . Pulmonary embolism (HCC) 09/17/2012    "just today; 3 on the right; 2 on the left" (09/17/2012)  . Hypothyroidism     "wiped out from taking amiodarone" (09/17/2012)  . Obesity (BMI 30-39.9) 07/17/2014  . Dysrhythmia   . History of bronchitis   . GERD (gastroesophageal reflux disease)   . Blood dyscrasia     lupus anticoagulant  . Lyme disease     history of   . Hypercoagulation syndrome (HCC)     secondary to circulating lupus anticoagulant  . Sleep apnea     uses CPAP  . PONV (postoperative nausea and vomiting)     likes scopolomanine patch    Past Surgical History  Procedure Laterality Date  . Cardiovascular stress test  07/17/2009    EF 59%  . Transthoracic echocardiogram  03/05/2010    EF  60-65%  . Shoulder surgery      left  . Inguinal hernia repair Right 1992  . Inguinal hernia repair Left 2013  . Distal biceps tendon repair Right 2011  . Shoulder arthroscopy w/ rotator cuff repair Left 09/04/2012  . Cardioversion    . Wisdom tooth extraction      lower 2  . Total hip arthroplasty Right 11/26/2014    Procedure: RIGHT TOTAL HIP ARTHROPLASTY ANTERIOR APPROACH;  Surgeon: Ollen Gross, MD;  Location: [facility];  Service: Orthopedics;  Laterality: Right;  . Umbilical hernia repair N/A 05/14/2015    Procedure: HERNIA REPAIR UMBILICAL ADULT WITH MESH ;  Surgeon: Avel Peace, MD;  Location: [facility];  Service: General;  Laterality: N/A;  . Insertion of mesh N/A 05/14/2015    Procedure: INSERTION OF MESH;  Surgeon: Avel Peace, MD;  Location: [facility];  Service: General;   Laterality: N/A;     Medications: Current Outpatient Prescriptions  Medication Sig Dispense Refill  . ALPRAZolam (XANAX) 0.25 MG tablet Take 1 tablet (0.25 mg total) by mouth 2 (two) times daily as needed for anxiety. (Patient taking differently: Take 0.25 mg by mouth 2 (two) times daily. ) 30 tablet 0  . amLODipine (NORVASC) 5 MG tablet TAKE 1 TABLET BY MOUTH ONCE DAILY 90 tablet 2  . ANDROGEL PUMP 20.25 MG/ACT (1.62%) GEL Apply 1 application topically daily.   4  . furosemide (LASIX) 20 MG tablet Take 1 tablet (20 mg total) by mouth daily as needed for fluid. 90 tablet 3  . lisinopril (PRINIVIL,ZESTRIL) 20 MG tablet Take 1.5 tablets (30 mg total) by mouth daily. 125 tablet 3  . metoprolol succinate (TOPROL-XL) 25 MG 24 hr tablet TAKE ONE TABLET BY MOUTH EVERY MORNING 90 tablet 2  . omega-3 acid ethyl esters (LOVAZA) 1 g capsule Take 1 capsule (1 g total) by mouth 2 (two) times daily. 360 capsule 3  . omeprazole (PRILOSEC) 20 MG capsule Take 1 capsule (20 mg total) by mouth daily. 30 capsule 0  . propafenone (RYTHMOL SR) 325 MG 12 hr capsule Take 1 capsule (325 mg total) by mouth 2 (two) times daily. 180 capsule 3  . SYNTHROID 150 MCG tablet Take 300 mcg by mouth daily.   0  . XARELTO 20 MG TABS tablet Take 20 mg by mouth daily.  1  . ZETIA 10 MG tablet TAKE 1 TABLET BY MOUTH ONCE DAILY 90 tablet 3   No current facility-administered medications for this visit.    Allergies: Allergies  Allergen Reactions  . Crestor [Rosuvastatin Calcium] Other (See Comments)    hepatitis  . Lipitor [Atorvastatin Calcium] Other (See Comments)    Feels like someone hit him with 2x4    Social History: The patient  reports that he has never smoked. He has never used smokeless tobacco. He reports that he drinks about 4.8 oz of alcohol per week. He reports that he does not use illicit drugs.   Family History: The patient's family history includes Heart attack in his maternal grandfather; Hyperlipidemia  in his mother; Hypertension in his mother.   Review of Systems: Please see the history of present illness.   Otherwise, the review of systems is positive for none.   All other systems are reviewed and negative.   Physical Exam: VS:  BP 120/74 mmHg  Pulse 80  Ht 6' 3.5" (1.918 m)  Wt 253 lb 1.9 oz (114.814 kg)  BMI 31.21 kg/m2 .  BMI Body mass index is 31.21 kg/(m^2).  Wt Readings from Last 3 Encounters:  06/19/15 253 lb 1.9 oz (114.814 kg)  05/14/15 246 lb (111.585 kg)  05/13/15 246 lb (111.585 kg)    General: Pleasant. Well developed, well nourished and in no acute distress. He is down 9 pounds.  HEENT: Normal. Neck: Supple, no JVD, carotid bruits, or masses noted.  Cardiac: Regular rate and rhythm. His heart tones are distant. No edema.  Respiratory:  Lungs are clear to auscultation bilaterally with normal work of breathing.  GI: Soft and nontender.  MS: No deformity or atrophy. Gait and ROM intact. Skin: Warm and dry. Color is normal.  Neuro:  Strength and sensation are intact and no gross focal deficits noted.  Psych: Alert, appropriate and with normal affect.   LABORATORY DATA:  EKG:  EKG is not ordered today.  Lab Results  Component Value Date   WBC 5.0 05/13/2015   HGB 14.0 05/13/2015   HCT 44.1 05/13/2015   PLT 238 05/13/2015   GLUCOSE 98 05/13/2015   CHOL 189 04/04/2014   TRIG 116.0 04/04/2014   HDL 40.60 04/04/2014   LDLCALC 125* 04/04/2014   ALT 34 05/13/2015   AST 36 05/13/2015   NA 139 05/13/2015   K 4.7 05/13/2015   CL 103 05/13/2015   CREATININE 1.51* 05/13/2015   BUN 22* 05/13/2015   CO2 27 05/13/2015   TSH 3.70 11/29/2013   INR 1.17 05/13/2015    BNP (last 3 results) No results for input(s): BNP in the last 8760 hours.  ProBNP (last 3 results) No results for input(s): PROBNP in the last 8760 hours.   Other Studies Reviewed Today:  Echo Study Conclusions from 2014  Left ventricle: The cavity size was normal. Wall thickness was  increased in a pattern of mild LVH. Systolic function was normal. The estimated ejection fraction was in the range of 55% to 60%. There was an increased relative contribution of atrial contraction to ventricular filling.   Myoview Impression from 2014 Exercise Capacity: Good exercise capacity. BP Response: Normal blood pressure response. Clinical Symptoms: Mild dyspnea ECG Impression: No significant ST segment change suggestive of ischemia. Comparison with Prior Nuclear Study: No significant change from previous study  Overall Impression: Low risk stress nuclear study. No evidence for ischemia or infarction. EF mildly decreased.   LV Ejection Fraction: 53%. LV Wall Motion: Mild global hypokinesis.   Marca AnconaDalton McLean 02/06/2012   Assessment/Plan: 1. Hypercoagulable syndrome secondary to circulating lupus anticoagulant. Past history of pulmonary emboli and past history of DVTs. He is on lifelong anticoagulation with Xarelto. No problems noted.  2. Paroxysmal atrial fibrillation, currently maintaining normal sinus rhythm  3. Hypercholesterolemia - followed by primary care.   4. Essential hypertension - BP much lower continue with weight loss efforts.  5. Hypothyroidism followed by Dr. Evlyn [place]  6. Osteoarthritis of right hip. Now s/p THR. Doing well.   Current medicines are reviewed with the patient today.  The patient does not have concerns regarding medicines other than what has been noted above.  The following changes have been made:  See above.  Labs/ tests ordered today include:   No orders of the defined types were placed in this encounter.      Patient is agreeable to this plan and will call if any problems develop in the interim.    Kristeen MissPhilip [name], MD  06/19/2015 2:50 PM    [facility] [address],  [address] [place], [place]  [address] Pager [phone] Phone: [phone]  [phone]; Fax: [phone]

## 2015-06-19 NOTE — Patient Instructions (Signed)
Medication Instructions:  Your physician recommends that you continue on your current medications as directed. Please refer to the Current Medication list given to you today.   Labwork: None Ordered   Testing/Procedures: None Ordered   Follow-Up: Your physician wants you to follow-up in: 6 months with Dr. [name].  You will receive a reminder letter in the mail two months in advance. If you don't receive a letter, please call our office to schedule the follow-up appointment.   If you need a refill on your cardiac medications before your next appointment, please call your pharmacy.   Thank you for choosing [facility]! Danniella Robben, RN [phone]    

## 2015-06-26 ENCOUNTER — Ambulatory Visit: Payer: BLUE CROSS/BLUE SHIELD | Admitting: Cardiovascular Disease

## 2015-07-28 ENCOUNTER — Other Ambulatory Visit: Payer: Self-pay

## 2015-07-28 MED ORDER — PROPAFENONE HCL ER 325 MG PO CP12: 325.0000 mg | ORAL_CAPSULE | Freq: Two times a day (BID) | ORAL | Status: DC

## 2015-08-14 ENCOUNTER — Telehealth: Payer: Self-pay | Admitting: Cardiology

## 2015-08-14 NOTE — Telephone Encounter (Signed)
New message        The supply is calling the pt has had a mini Cpap ordered since December 2016, but the notes provided from medical records they could not use  The company needs sleep study notes and [employer] prescription company for the CPAP machine and MD note. FAx number [phone]   Boneta Lucks has asked if someone could please call her back so she can get this taken care of please

## 2015-08-21 NOTE — Telephone Encounter (Signed)
I am working on getting all information over to the company.  I have the form from them that Dr. Mayford Knife needs to sign, and I am leaving it in her box to do so.   I have tried to contact the supply company, and there is no answer at that number and no voicemail picks up.   Once everything is together, and the paper from Dr. Mayford Knife is signed, I will fax everything over to them.

## 2015-08-25 NOTE — Telephone Encounter (Signed)
Paperwork has been faxed to mini cpap company.

## 2015-08-27 ENCOUNTER — Ambulatory Visit (INDEPENDENT_AMBULATORY_CARE_PROVIDER_SITE_OTHER): Payer: BLUE CROSS/BLUE SHIELD | Admitting: Family Medicine

## 2015-08-27 ENCOUNTER — Encounter: Payer: Self-pay | Admitting: Family Medicine

## 2015-08-27 VITALS — BP 137/82 | HR 66 | Resp 17 | Ht 75.0 in | Wt 259.0 lb

## 2015-08-27 DIAGNOSIS — Z7901 Long term (current) use of anticoagulants: Secondary | ICD-10-CM | POA: Insufficient documentation

## 2015-08-27 DIAGNOSIS — M791 Myalgia, unspecified site: Secondary | ICD-10-CM

## 2015-08-27 DIAGNOSIS — Z86718 Personal history of other venous thrombosis and embolism: Secondary | ICD-10-CM | POA: Insufficient documentation

## 2015-08-27 DIAGNOSIS — M158 Other polyosteoarthritis: Secondary | ICD-10-CM

## 2015-08-27 DIAGNOSIS — I10 Essential (primary) hypertension: Secondary | ICD-10-CM | POA: Diagnosis not present

## 2015-08-27 DIAGNOSIS — F4323 Adjustment disorder with mixed anxiety and depressed mood: Secondary | ICD-10-CM | POA: Diagnosis not present

## 2015-08-27 DIAGNOSIS — G4733 Obstructive sleep apnea (adult) (pediatric): Secondary | ICD-10-CM

## 2015-08-27 DIAGNOSIS — A6920 Lyme disease, unspecified: Secondary | ICD-10-CM | POA: Insufficient documentation

## 2015-08-27 DIAGNOSIS — Z114 Encounter for screening for human immunodeficiency virus [HIV]: Secondary | ICD-10-CM

## 2015-08-27 DIAGNOSIS — E038 Other specified hypothyroidism: Secondary | ICD-10-CM

## 2015-08-27 DIAGNOSIS — R413 Other amnesia: Secondary | ICD-10-CM

## 2015-08-27 DIAGNOSIS — E785 Hyperlipidemia, unspecified: Secondary | ICD-10-CM

## 2015-08-27 DIAGNOSIS — E669 Obesity, unspecified: Secondary | ICD-10-CM

## 2015-08-27 DIAGNOSIS — F438 Other reactions to severe stress: Secondary | ICD-10-CM | POA: Insufficient documentation

## 2015-08-27 DIAGNOSIS — Z9989 Dependence on other enabling machines and devices: Secondary | ICD-10-CM

## 2015-08-27 DIAGNOSIS — F4389 Other reactions to severe stress: Secondary | ICD-10-CM | POA: Insufficient documentation

## 2015-08-27 DIAGNOSIS — M159 Polyosteoarthritis, unspecified: Secondary | ICD-10-CM | POA: Insufficient documentation

## 2015-08-27 DIAGNOSIS — K219 Gastro-esophageal reflux disease without esophagitis: Secondary | ICD-10-CM

## 2015-08-27 NOTE — Assessment & Plan Note (Signed)
On Xarelto per cardiology.

## 2015-08-27 NOTE — Assessment & Plan Note (Addendum)
Continue current medicines. Zetia and Lovaza  Patient was unable to tolerate statins.  Dietary and lifestyle modifications discussed with patient in addition to meds

## 2015-08-27 NOTE — Patient Instructions (Signed)
     Mediterranean Diet  Why follow it? Research shows. . Those who follow the Mediterranean diet have a reduced risk of heart disease  . The diet is associated with a reduced incidence of Parkinson's and Alzheimer's diseases . People following the diet may have longer life expectancies and lower rates of chronic diseases  . The Dietary Guidelines for Americans recommends the Mediterranean diet as an eating plan to promote health and prevent disease  What Is the Mediterranean Diet?  . Healthy eating plan based on typical foods and recipes of Mediterranean-style cooking . The diet is primarily a plant based diet; these foods should make up a majority of meals   Starches - Plant based foods should make up a majority of meals - They are an important sources of vitamins, minerals, energy, antioxidants, and fiber - Choose whole grains, foods high in fiber and minimally processed items  - Typical grain sources include wheat, oats, barley, corn, brown rice, bulgar, farro, millet, polenta, couscous  - Various types of beans include chickpeas, lentils, fava beans, black beans, white beans   Fruits  Veggies - Large quantities of antioxidant rich fruits & veggies; 6 or more servings  - Vegetables can be eaten raw or lightly drizzled with oil and cooked  - Vegetables common to the traditional Mediterranean Diet include: artichokes, arugula, beets, broccoli, brussel sprouts, cabbage, carrots, celery, collard greens, cucumbers, eggplant, kale, leeks, lemons, lettuce, mushrooms, okra, onions, peas, peppers, potatoes, pumpkin, radishes, rutabaga, shallots, spinach, sweet potatoes, turnips, zucchini - Fruits common to the Mediterranean Diet include: apples, apricots, avocados, cherries, clementines, dates, figs, grapefruits, grapes, melons, nectarines, oranges, peaches, pears, pomegranates, strawberries, tangerines  Fats - Replace butter and margarine with healthy oils, such as olive oil, canola oil, and  tahini  - Limit nuts to no more than a handful a day  - Nuts include walnuts, almonds, pecans, pistachios, pine nuts  - Limit or avoid candied, honey roasted or heavily salted nuts - Olives are central to the Mediterranean diet - can be eaten whole or used in a variety of dishes   Meats Protein - Limiting red meat: no more than a few times a month - When eating red meat: choose lean cuts and keep the portion to the size of deck of cards - Eggs: approx. 0 to 4 times a week  - Fish and lean poultry: at least 2 a week  - Healthy protein sources include, chicken, turkey, lean beef, lamb - Increase intake of seafood such as tuna, salmon, trout, mackerel, shrimp, scallops - Avoid or limit high fat processed meats such as sausage and bacon  Dairy - Include moderate amounts of low fat dairy products  - Focus on healthy dairy such as fat free yogurt, skim milk, low or reduced fat cheese - Limit dairy products higher in fat such as whole or 2% milk, cheese, ice cream  Alcohol - Moderate amounts of red wine is ok  - No more than 5 oz daily for women (all ages) and men older than age 65  - No more than 10 oz of wine daily for men younger than 65  Other - Limit sweets and other desserts  - Use herbs and spices instead of salt to flavor foods  - Herbs and spices common to the traditional Mediterranean Diet include: basil, bay leaves, chives, cloves, cumin, fennel, garlic, lavender, marjoram, mint, oregano, parsley, pepper, rosemary, sage, savory, sumac, tarragon, thyme   It's not just a diet, it's   a lifestyle:  . The Mediterranean diet includes lifestyle factors typical of those in the region  . Foods, drinks and meals are best eaten with others and savored . Daily physical activity is important for overall good health . This could be strenuous exercise like running and aerobics . This could also be more leisurely activities such as walking, housework, yard-work, or taking the stairs . Moderation is  the key; a balanced and healthy diet accommodates most foods and drinks . Consider portion sizes and frequency of consumption of certain foods   Meal Ideas & Options:  . Breakfast:  o Whole wheat toast or whole wheat English muffins with peanut butter & hard boiled egg o Steel cut oats topped with apples & cinnamon and skim milk  o Fresh fruit: banana, strawberries, melon, berries, peaches  o Smoothies: strawberries, bananas, greek yogurt, peanut butter o Low fat greek yogurt with blueberries and granola  o Egg white omelet with spinach and mushrooms o Breakfast couscous: whole wheat couscous, apricots, skim milk, cranberries  . Sandwiches:  o Hummus and grilled vegetables (peppers, zucchini, squash) on whole wheat bread   o Grilled chicken on whole wheat pita with lettuce, tomatoes, cucumbers or tzatziki  o Tuna salad on whole wheat bread: tuna salad made with greek yogurt, olives, red peppers, capers, green onions o Garlic rosemary lamb pita: lamb sauted with garlic, rosemary, salt & pepper; add lettuce, cucumber, greek yogurt to pita - flavor with lemon juice and black pepper  . Seafood:  o Mediterranean grilled salmon, seasoned with garlic, basil, parsley, lemon juice and black pepper o Shrimp, lemon, and spinach whole-grain pasta salad made with low fat greek yogurt  o Seared scallops with lemon orzo  o Seared tuna steaks seasoned salt, pepper, coriander topped with tomato mixture of olives, tomatoes, olive oil, minced garlic, parsley, green onions and cappers  . Meats:  o Herbed greek chicken salad with kalamata olives, cucumber, feta  o Red bell peppers stuffed with spinach, bulgur, lean ground beef (or lentils) & topped with feta   o Kebabs: skewers of chicken, tomatoes, onions, zucchini, squash  o Turkey burgers: made with red onions, mint, dill, lemon juice, feta cheese topped with roasted red peppers . Vegetarian o Cucumber salad: cucumbers, artichoke hearts, celery, red  onion, feta cheese, tossed in olive oil & lemon juice  o Hummus and whole grain pita points with a greek salad (lettuce, tomato, feta, olives, cucumbers, red onion) o Lentil soup with celery, carrots made with vegetable broth, garlic, salt and pepper  o Tabouli salad: parsley, bulgur, mint, scallions, cucumbers, tomato, radishes, lemon juice, olive oil, salt and pepper.  

## 2015-08-27 NOTE — Assessment & Plan Note (Signed)
We discussed lowering BMI would improve his arthritis and other multiple medical issues and concerns.   Advised prudent diet and routine cardiovascular aerobic moderate intensity activity of 30 minutes 5 days per week or more.

## 2015-08-27 NOTE — Assessment & Plan Note (Signed)
Encouraged patient to get me his medical records from all of his various specialists he has seen since 2014.   We will obtain routine blood work including a dementia panel in near future.

## 2015-08-27 NOTE — Assessment & Plan Note (Addendum)
We will check lab work in near future; will include TSH but patient sees Dr. Evlyn [facility] of endocrinology regularly as well.  Which I'm happy for patient to continue with him however, explained to patient I can manage this as well, but then patient can follow up with endocrinology when necessary

## 2015-08-27 NOTE — Assessment & Plan Note (Addendum)
Patient was under the care [place] specialized [place].  He underwent several rounds of prolonged antibiotic regimens and weekly infusions of vitamin C- without improvement of his generalized myalgias or memory concerns.  We will obtain lab work in near future to include B12, vitamin D, magnesium and phosphorus as well as CMP and TSH.

## 2015-08-27 NOTE — Assessment & Plan Note (Signed)
Asked patient to get me his medical records from all the specialists he has seen in the past.    Based on my review of all of those medical records, we will then make further recommendations

## 2015-08-27 NOTE — Assessment & Plan Note (Addendum)
Patient follows with Dr. Mayford Knifeurner for adjustments to his CPAP machine. Appears to be compliant. Stable

## 2015-08-27 NOTE — Assessment & Plan Note (Addendum)
Recommend low salt and eat a heart healthy\Mediterranean type diet.   Recommend regular exercise  Blood pressure medical management via cardiology Dr. Elease [name].

## 2015-08-27 NOTE — Assessment & Plan Note (Signed)
Patient denies symptoms currently.
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
## 2015-08-27 NOTE — Assessment & Plan Note (Signed)
Well-controlled.  Continue omeprazole. 

## 2015-09-08 ENCOUNTER — Other Ambulatory Visit: Payer: Self-pay

## 2015-09-08 DIAGNOSIS — Z114 Encounter for screening for human immunodeficiency virus [HIV]: Secondary | ICD-10-CM

## 2015-09-10 ENCOUNTER — Other Ambulatory Visit (INDEPENDENT_AMBULATORY_CARE_PROVIDER_SITE_OTHER): Payer: BLUE CROSS/BLUE SHIELD

## 2015-09-10 DIAGNOSIS — I10 Essential (primary) hypertension: Secondary | ICD-10-CM

## 2015-09-10 DIAGNOSIS — Z7901 Long term (current) use of anticoagulants: Secondary | ICD-10-CM

## 2015-09-10 DIAGNOSIS — Z114 Encounter for screening for human immunodeficiency virus [HIV]: Secondary | ICD-10-CM

## 2015-09-10 DIAGNOSIS — K219 Gastro-esophageal reflux disease without esophagitis: Secondary | ICD-10-CM | POA: Diagnosis not present

## 2015-09-10 DIAGNOSIS — G4733 Obstructive sleep apnea (adult) (pediatric): Secondary | ICD-10-CM

## 2015-09-10 DIAGNOSIS — E669 Obesity, unspecified: Secondary | ICD-10-CM | POA: Diagnosis not present

## 2015-09-10 DIAGNOSIS — E038 Other specified hypothyroidism: Secondary | ICD-10-CM | POA: Diagnosis not present

## 2015-09-10 DIAGNOSIS — M791 Myalgia, unspecified site: Secondary | ICD-10-CM

## 2015-09-10 DIAGNOSIS — E785 Hyperlipidemia, unspecified: Secondary | ICD-10-CM

## 2015-09-10 DIAGNOSIS — R413 Other amnesia: Secondary | ICD-10-CM

## 2015-09-11 ENCOUNTER — Other Ambulatory Visit: Payer: BLUE CROSS/BLUE SHIELD

## 2015-09-11 LAB — LIPID PANEL
Cholesterol: 187 mg/dL (ref 125–200)
HDL: 45 mg/dL (ref >=40)
LDL CALC: 128 mg/dL (ref <130)
TRIGLYCERIDES: 70 mg/dL (ref <150)
Total CHOL/HDL Ratio: 4.2 Ratio (ref <=5.0)
VLDL: 14 mg/dL (ref <30)

## 2015-09-11 LAB — COMPLETE METABOLIC PANEL WITH GFR
ALT: 20 U/L (ref 9–46)
AST: 29 U/L (ref 10–35)
Albumin: 3.9 g/dL (ref 3.6–5.1)
Alkaline Phosphatase: 65 U/L (ref 40–115)
BUN: 14 mg/dL (ref 7–25)
CALCIUM: 9.2 mg/dL (ref 8.6–10.3)
CHLORIDE: 103 mmol/L (ref 98–110)
CO2: 24 mmol/L (ref 20–31)
Creat: 1.31 mg/dL (ref 0.70–1.33)
GFR, Est African American: 71 mL/min (ref >=60)
GFR, Est Non African American: 62 mL/min (ref >=60)
GLUCOSE: 96 mg/dL (ref 65–99)
POTASSIUM: 4.1 mmol/L (ref 3.5–5.3)
SODIUM: 139 mmol/L (ref 135–146)
Total Bilirubin: 0.7 mg/dL (ref 0.2–1.2)
Total Protein: 6.6 g/dL (ref 6.1–8.1)

## 2015-09-11 LAB — CBC WITH DIFFERENTIAL/PLATELET
BASOS ABS: 44 {cells}/uL (ref 0–200)
Basophils Relative: 1 %
EOS ABS: 88 {cells}/uL (ref 15–500)
EOS PCT: 2 %
HEMATOCRIT: 46.2 % (ref 38.5–50.0)
HEMOGLOBIN: 14.3 g/dL (ref 13.2–17.1)
LYMPHS ABS: 1276 {cells}/uL (ref 850–3900)
Lymphocytes Relative: 29 %
MCH: 24.7 pg — AB (ref 27.0–33.0)
MCHC: 31 g/dL — AB (ref 32.0–36.0)
MCV: 79.8 fL — AB (ref 80.0–100.0)
MONO ABS: 528 {cells}/uL (ref 200–950)
MPV: 10.7 fL (ref 7.5–12.5)
Monocytes Relative: 12 %
NEUTROS ABS: 2464 {cells}/uL (ref 1500–7800)
NEUTROS PCT: 56 %
Platelets: 245 10*3/uL (ref 140–400)
RBC: 5.79 MIL/uL (ref 4.20–5.80)
RDW: 16.6 % — ABNORMAL HIGH (ref 11.0–15.0)
WBC: 4.4 10*3/uL (ref 3.8–10.8)

## 2015-09-11 LAB — VITAMIN D 25 HYDROXY (VIT D DEFICIENCY, FRACTURES): Vit D, 25-Hydroxy: 44 ng/mL (ref 30–100)

## 2015-09-11 LAB — MAGNESIUM: Magnesium: 2 mg/dL (ref 1.5–2.5)

## 2015-09-11 LAB — TSH: TSH: 1.54 m[IU]/L (ref 0.40–4.50)

## 2015-09-11 LAB — PHOSPHORUS: PHOSPHORUS: 3.2 mg/dL (ref 2.5–4.5)

## 2015-09-11 LAB — FOLATE: FOLATE: 20 ng/mL (ref >5.4)

## 2015-09-11 LAB — VITAMIN B12: VITAMIN B 12: 615 pg/mL (ref 200–1100)

## 2015-09-11 LAB — SEDIMENTATION RATE: SED RATE: 4 mm/h (ref 0–20)

## 2015-09-11 LAB — HIV ANTIBODY (ROUTINE TESTING W REFLEX): HIV: NONREACTIVE

## 2015-09-11 LAB — HEMOGLOBIN A1C
HEMOGLOBIN A1C: 5.4 % (ref <5.7)
MEAN PLASMA GLUCOSE: 108 mg/dL

## 2015-09-11 LAB — RPR

## 2015-09-24 ENCOUNTER — Other Ambulatory Visit: Payer: Self-pay | Admitting: *Deleted

## 2015-09-24 MED ORDER — XARELTO 20 MG PO TABS: 20.0000 mg | ORAL_TABLET | Freq: Every day | ORAL | 8 refills | Status: DC

## 2015-09-30 ENCOUNTER — Other Ambulatory Visit: Payer: Self-pay | Admitting: *Deleted

## 2015-09-30 MED ORDER — AMLODIPINE BESYLATE 5 MG PO TABS: 5.0000 mg | ORAL_TABLET | Freq: Every day | ORAL | 2 refills | Status: DC

## 2015-10-01 ENCOUNTER — Encounter: Payer: Self-pay | Admitting: Family Medicine

## 2015-10-01 ENCOUNTER — Ambulatory Visit (INDEPENDENT_AMBULATORY_CARE_PROVIDER_SITE_OTHER): Payer: BLUE CROSS/BLUE SHIELD | Admitting: Family Medicine

## 2015-10-01 VITALS — BP 150/93 | HR 63 | Ht 75.0 in | Wt 259.5 lb

## 2015-10-01 DIAGNOSIS — Z7189 Other specified counseling: Secondary | ICD-10-CM

## 2015-10-01 DIAGNOSIS — E669 Obesity, unspecified: Secondary | ICD-10-CM

## 2015-10-01 DIAGNOSIS — L218 Other seborrheic dermatitis: Secondary | ICD-10-CM | POA: Diagnosis not present

## 2015-10-01 DIAGNOSIS — Z23 Encounter for immunization: Secondary | ICD-10-CM

## 2015-10-01 DIAGNOSIS — Z125 Encounter for screening for malignant neoplasm of prostate: Secondary | ICD-10-CM

## 2015-10-01 DIAGNOSIS — Z0000 Encounter for general adult medical examination without abnormal findings: Secondary | ICD-10-CM | POA: Diagnosis not present

## 2015-10-01 DIAGNOSIS — L219 Seborrheic dermatitis, unspecified: Secondary | ICD-10-CM

## 2015-10-01 DIAGNOSIS — Z1211 Encounter for screening for malignant neoplasm of colon: Secondary | ICD-10-CM

## 2015-10-01 MED ORDER — KETOCONAZOLE 2 % EX SHAM: MEDICATED_SHAMPOO | CUTANEOUS | 3 refills | Status: DC

## 2015-10-01 MED ORDER — TRIAMCINOLONE ACETONIDE 0.1 % EX CREA: TOPICAL_CREAM | CUTANEOUS | 0 refills | Status: DC

## 2015-10-01 NOTE — Progress Notes (Signed)
    Male physical  Opened in error-  See other office visit note from me dictated on this today for complete physical exam and other office visit details

## 2015-10-01 NOTE — Patient Instructions (Signed)
Please return the 3 stool cards at your convenience.    Seborrheic Dermatitis Seborrheic dermatitis involves pink or red skin with greasy, flaky scales. It usually occurs on the scalp, and it is often called dandruff. This condition may also affect the eyebrows, nose, ears, chest, and the bearded area of men's faces. It often occurs where skin has more oil (sebaceous) glands. It may come and go for no known reason, and it is often long-lasting (chronic). CAUSES The cause is not known. RISK FACTORS This condition is more like to develop in:  People who are stressed or tired.  People who have skin conditions, such as acne.  People who have certain conditions, such as:  HIV (human immunodeficiency virus).  AIDS (acquired immunodeficiency syndrome).  Parkinson disease.  An eating disorder.  Stroke.  Depression.  Epilepsy.  Alcoholism.  People who live in places that have extreme weather.  People who have a family history of seborrheic dermatitis.  People who use skin creams that are made with alcohol.  People who are 1430-53 years old.  People who take certain medicines. SYMPTOMS Symptoms of this condition include:  Thick scales on the scalp.  Redness on the face or in the armpits.  Skin that is flaky. The flakes may be white or yellow.  Skin that seems oily or dry but is not helped with moisturizers.  Itching or burning in the affected areas. DIAGNOSIS This condition is diagnosed with a medical history and physical exam. A sample of your skin may be tested (skin biopsy). You may need to see a skin specialist (dermatologist). TREATMENT There is no cure for this condition, but treatment can help to manage the symptoms. Treatment may include:  Cortisone (steroid) ointments, creams, and lotions.  Over-the-counter or prescription shampoos. HOME CARE INSTRUCTIONS  Apply over-the-counter and prescription medicines only as told by your health care  provider.  Keep all follow-up visits as told by your health care provider. This is important.  Try to reduce your stress, such as with yoga or mediation. If you need help to reduce stress, ask your health care provider.  Shower or bathe as told by your health care provider.  Use any medicated shampoos as told by your health care provider. SEEK MEDICAL CARE IF:  Your symptoms do not improve with treatment.  Your symptoms get worse.  You have new symptoms.   This information is not intended to replace advice given to you by your health care provider. Make sure you discuss any questions you have with your health care provider.   Document Released: 01/03/2005 Document Revised: 09/24/2014 Document Reviewed: 05/21/2014 Elsevier Interactive Patient Education 2016 [employer]    -  AHA guidelines for exercise of 150 minutes of moderate intensity aerobic activity per week discussed and encouraged.   Regular exercise will improve brain function and memory, as well as improve mood, boost immune system and help with weight management, among the other, more well-known effects of exercise such as decreasing risk for hypertension, diabetes, hyperlipidemia etc.  -  The AHA strongly endorses consumption of a diet that contains a variety of foods from all the food categories with an emphasis on fruits and vegetables; fat-free and low-fat dairy products; cereal and grain products; legumes and nuts; and fish, poultry, and or lean meats.   Excessive food intake, especially of foods high in saturated and trans fats, sugar, and salt, should be avoided  Guidelines for a Low Cholesterol, Low Saturated Fat Diet   Fats - Limit  total intake of fats and oils. - Avoid butter, stick margarine, shortening, lard, palm and coconut oils. - Limit mayonnaise, salad dressings, gravies and sauces, unless they are homemade with low-fat ingredients. - Limit chocolate. - Choose low-fat and nonfat products, such as  low-fat mayonnaise, low-fat or non-hydrogenated peanut butter, low-fat or fat-free salad dressings and nonfat gravy. - Use vegetable oil, such as canola or olive oil. - Look for margarine that does not contain trans fatty acids. - Use nuts in moderate amounts. - Read ingredient labels carefully to determine both amount and type of fat present in foods. Limit saturated and trans fats! - Avoid high-fat processed and convenience foods.  Meats and Meat Alternatives - Choose fish, chicken, [place] and lean meats. - Use dried beans, peas, lentils and tofu. - Limit egg yolks to three to four per week. - If you eat red meat, limit to no more than three servings per week and choose loin or round cuts. - Avoid fatty meats, such as bacon, sausage, franks, luncheon meats and ribs. - Avoid all organ meats, including liver.  Dairy - Choose nonfat or low-fat milk, yogurt and cottage cheese. - Most cheeses are high in fat. Choose cheeses made from non-fat milk, such as mozzarella and ricotta cheese. - Choose light or fat-free cream cheese and sour cream. - Avoid cream and sauces made with cream.  Fruits and Vegetables - Eat a wide variety of fruits and vegetables. - Use lemon juice, vinegar or "mist" olive oil on vegetables. - Avoid adding sauces, fat or oil to vegetables.  Breads, Cereals and Grains - Choose whole-grain breads, cereals, pastas and rice. - Avoid high-fat snack foods, such as granola, cookies, pies, pastries, doughnuts and croissants.  Cooking Tips - Avoid deep fried foods. - Trim visible fat off meats and remove skin from poultry before cooking. - Bake, broil, boil, poach or roast poultry, fish and lean meats. - Drain and discard fat that drains out of meat as you cook it. - Add little or no fat to foods. - Use vegetable oil sprays to grease pans for cooking or baking. - Steam vegetables. - Use herbs or no-oil marinades to flavor foods.

## 2015-10-01 NOTE — Progress Notes (Signed)
Male physical  Impression and Recommendations:    1. Annual physical exam   2. Need for Tdap vaccination   3. Seborrheic dermatitis of scalp   4. Obesity (BMI 30-39.9)   5. Counseling on health promotion and disease prevention   6. Colon cancer screening   7. Screening for prostate cancer    1) Anticipatory Guidance: Discussed importance of wearing a seatbelt while driving, sunscreen when outside along with skin surveillance; eating a balanced and modest diet; physical activity at least 25 minutes per day or 150 min/ week moderate to intense activity.  2) Immunizations / Screenings / Labs:  All immunizations are up-to-date per recommendations or will be updated today. Patient gets routine dental and vision screens.  All recent lab work was reviewed and within normal limits done at the end of August  3) Weight:  BMI does indicate pt needs to lose weight.   Discussed goal of losing 5-10% of current body weight which would improve overall feelings of well being and improve objective health data. Improve nutrient density of diet through increasing intake of fruits and vegetables and decreasing saturated fats, white flour products and refined sugars.   Gross side effects, risk and benefits, and alternatives of medications discussed with patient.  Patient is aware that all medications have potential side effects and we are unable to predict every side effect or drug-drug interaction that may occur.  Expresses verbal understanding and consents to current therapy plan and treatment regimen.  Follow-up preventative CPE in 1 year. Follow-up office visit pending lab work.  F/up sooner for chronic care management and/or prn   1) seborrheic dermatitis:  Discussed with patient and pathophysiology of this condition.  Medications discussed of triamcinolone cream and Nizoral shampoo. All questions were answered. Follow-up in 6-8 weeks if no improvement.  Orders Placed This Encounter  Procedures  .  Tdap vaccine greater than or equal to 7yo IM    Patient's Medications  New Prescriptions   KETOCONAZOLE (NIZORAL) 2 % SHAMPOO    Applied 2-3 times weekly for several weeks until in remission. Once controlled, use weekly   TRIAMCINOLONE CREAM (KENALOG) 0.1 %    Use twice daily as needed itch\irritation only to affected skin  Previous Medications   ALPRAZOLAM (XANAX) 0.25 MG TABLET    Take 1 tablet (0.25 mg total) by mouth 2 (two) times daily as needed for anxiety.   AMLODIPINE (NORVASC) 5 MG TABLET    Take 1 tablet (5 mg total) by mouth daily.   ANDROGEL PUMP 20.25 MG/ACT (1.62%) GEL    Apply 1 application topically daily.    FUROSEMIDE (LASIX) 20 MG TABLET    Take 1 tablet (20 mg total) by mouth daily as needed for fluid.   IBUPROFEN (ADVIL,MOTRIN) 200 MG TABLET    Take 200 mg by mouth every 6 (six) hours as needed.   LISINOPRIL (PRINIVIL,ZESTRIL) 20 MG TABLET    Take 1.5 tablets (30 mg total) by mouth daily.   METOPROLOL SUCCINATE (TOPROL-XL) 25 MG 24 HR TABLET    TAKE ONE TABLET BY MOUTH EVERY MORNING   OMEGA-3 ACID ETHYL ESTERS (LOVAZA) 1 G CAPSULE    Take 1 capsule (1 g total) by mouth 2 (two) times daily.   OMEPRAZOLE (PRILOSEC) 20 MG CAPSULE    Take 1 capsule (20 mg total) by mouth daily.   PROPAFENONE (RYTHMOL SR) 325 MG 12 HR CAPSULE    Take 1 capsule (325 mg total) by mouth 2 (two) times daily.  SYNTHROID 150 MCG TABLET    Take 300 mcg by mouth daily.    XARELTO 20 MG TABS TABLET    Take 1 tablet (20 mg total) by mouth daily.   ZETIA 10 MG TABLET    TAKE 1 TABLET BY MOUTH ONCE DAILY  Modified Medications   No medications on file  Discontinued Medications   No medications on file     Please see AVS handed out to patient at the end of our visit for further patient instructions/ counseling done pertaining to today's office visit.    Subjective:    CC: CPE  HPI: Isaiah [name], Isaiah [name] is a 55 y.o. male who presents to [facility] today for a yearly  health maintenance exam.    Fasting labs recently done on 8\24\17- all essentially within normal limits.   Briefly discussed. All patient's questions were answered.  CC: No acute concerns today other than an itchy scalp and itchy ears. Has been told from time to time he has a red scalp in the back of his head.  Thought this was just from frequent trips to the barbershop and was normal.   Health Maintenance Summary Reviewed and updated, unless pt declines services.  Tobacco History Reviewed Alcohol: No concerns, no excessive use Exercise Habits: occasionally, hasn't been as consistent lately as he usually is.  STD concerns: none; monogamous  Drug Use: None Birth control method: Not applicable  Testicular/penile concerns: No  Cancer Family History: Mother breast, brother prostate    Wt Readings from Last 3 Encounters:  10/01/15 259 lb 8 oz (117.7 kg)  08/27/15 259 lb (117.5 kg)  06/19/15 253 lb 1.9 oz (114.8 kg)   BP Readings from Last 3 Encounters:  10/01/15 (!) 150/93  08/27/15 137/82  06/19/15 120/74   Pulse Readings from Last 3 Encounters:  10/01/15 63  08/27/15 66  06/19/15 80    Patient Active Problem List   Diagnosis Date Noted  . Seborrheic dermatitis of scalp 10/01/2015  . Osteoarthritis of multiple joints 08/27/2015  . Chronic anticoagulation- abnormal lupus anticoagulant 08/27/2015  . Personal history of multiple DVTs (deep vein thrombosis) 08/27/2015  . Adjustment disorder 08/27/2015  . CPAP (continuous positive airway pressure) dependence 08/27/2015  . GERD (gastroesophageal reflux disease) 08/27/2015  . h/o Lyme disease- 2014 tick bite 08/27/2015  . Memory changes- since Lyme Dx 08/27/2015  . Obesity (BMI 30-39.9) 07/17/2014  . OSA (obstructive sleep apnea) 05/02/2014  . Dyslipidemia 05/10/2013  . History of chronic Myalgias since Lyme Dx 2014 05/10/2013  . h/o PE (pulmonary embolism) 09/17/2012  . Hypothyroidism 12/17/2010  . HTN (hypertension)  10/01/2010  . PAF (paroxysmal atrial fibrillation) ([place]) 10/01/2010  . High risk medication use 10/01/2010    Past Medical History:  Diagnosis Date  . Blood dyscrasia    lupus anticoagulant  . Diastolic dysfunction    Grade I per echo in 2/12  . DVT (deep venous thrombosis) ([place]) 2000's   "both legs in the calves; left went all the way up to my groin" (09/17/2012)  . Dysrhythmia   . GERD (gastroesophageal reflux disease)   . History of bronchitis   . Hypercoagulation syndrome ([facility])    secondary to circulating lupus anticoagulant  . Hyperlipidemia   . Hypertension   . Hypothyroidism    "wiped out from taking amiodarone" (09/17/2012)  . Lyme disease    history of   . Obesity (BMI 30-39.9) 07/17/2014  . PAF (paroxysmal atrial fibrillation) ([place])  no recurrence since Feb 2012  . PONV (postoperative nausea and vomiting)    likes scopolomanine patch  . Pulmonary embolism ([place]) 09/17/2012   "just today; 3 on the right; 2 on the left" (09/17/2012)  . Situational stress   . Sleep apnea    uses CPAP    Past Surgical History:  Procedure Laterality Date  . CARDIOVASCULAR STRESS TEST  07/17/2009   EF 59%  . CARDIOVERSION    . DISTAL BICEPS TENDON REPAIR Right 2011  . INGUINAL HERNIA REPAIR Right 1992  . INGUINAL HERNIA REPAIR Left 2013  . INSERTION OF MESH N/A 05/14/2015   Procedure: INSERTION OF MESH;  Surgeon: Jackolyn Confer, MD;  Location: [facility];  Service: General;  Laterality: N/A;  . SHOULDER ARTHROSCOPY W/ ROTATOR CUFF REPAIR Left 09/04/2012  . SHOULDER SURGERY     left  . TOTAL HIP ARTHROPLASTY Right 11/26/2014   Procedure: RIGHT TOTAL HIP ARTHROPLASTY ANTERIOR APPROACH;  Surgeon: Gaynelle Arabian, MD;  Location: [facility];  Service: Orthopedics;  Laterality: Right;  . TRANSTHORACIC ECHOCARDIOGRAM  03/05/2010   EF 60-65%  . UMBILICAL HERNIA REPAIR N/A 05/14/2015   Procedure: HERNIA REPAIR UMBILICAL ADULT WITH MESH ;  Surgeon: Jackolyn Confer, MD;  Location: [facility];  Service: General;   Laterality: N/A;  . WISDOM TOOTH EXTRACTION     lower 2    Family History  Problem Relation Age of Onset  . Hypertension Mother   . Hyperlipidemia Mother   . Cancer Mother     breast  . Hypertension Father   . Cancer Brother     prostate  . Hypertension Brother   . Heart attack Maternal Grandfather     History  Drug Use No  ,  History  Alcohol Use  . 4.8 oz/week  . 4 Cans of beer, 4 Shots of liquor per week    Comment: 09/17/2012 "1-2 beers and 1-2 tequila on Fri and Sat nights"  ,  History  Smoking Status  . Never Smoker  Smokeless Tobacco  . Never Used  ,  History  Sexual Activity  . Sexual activity: Yes    Patient's Medications  New Prescriptions   KETOCONAZOLE (NIZORAL) 2 % SHAMPOO    Applied 2-3 times weekly for several weeks until in remission. Once controlled, use weekly   TRIAMCINOLONE CREAM (KENALOG) 0.1 %    Use twice daily as needed itch\irritation only to affected skin  Previous Medications   ALPRAZOLAM (XANAX) 0.25 MG TABLET    Take 1 tablet (0.25 mg total) by mouth 2 (two) times daily as needed for anxiety.   AMLODIPINE (NORVASC) 5 MG TABLET    Take 1 tablet (5 mg total) by mouth daily.   ANDROGEL PUMP 20.25 MG/ACT (1.62%) GEL    Apply 1 application topically daily.    FUROSEMIDE (LASIX) 20 MG TABLET    Take 1 tablet (20 mg total) by mouth daily as needed for fluid.   IBUPROFEN (ADVIL,MOTRIN) 200 MG TABLET    Take 200 mg by mouth every 6 (six) hours as needed.   LISINOPRIL (PRINIVIL,ZESTRIL) 20 MG TABLET    Take 1.5 tablets (30 mg total) by mouth daily.   METOPROLOL SUCCINATE (TOPROL-XL) 25 MG 24 HR TABLET    TAKE ONE TABLET BY MOUTH EVERY MORNING   OMEGA-3 ACID ETHYL ESTERS (LOVAZA) 1 G CAPSULE    Take 1 capsule (1 g total) by mouth 2 (two) times daily.   OMEPRAZOLE (PRILOSEC) 20 MG CAPSULE    Take 1  capsule (20 mg total) by mouth daily.   PROPAFENONE (RYTHMOL SR) 325 MG 12 HR CAPSULE    Take 1 capsule (325 mg total) by mouth 2 (two) times daily.    SYNTHROID 150 MCG TABLET    Take 300 mcg by mouth daily.    XARELTO 20 MG TABS TABLET    Take 1 tablet (20 mg total) by mouth daily.   ZETIA 10 MG TABLET    TAKE 1 TABLET BY MOUTH ONCE DAILY  Modified Medications   No medications on file  Discontinued Medications   No medications on file    Crestor [rosuvastatin calcium] and Lipitor [atorvastatin calcium]  Review of Systems:   ( Completed via her adult medical history intake form today ) General:  Denies fever, chills, appetite changes, unexplained weight loss.  Respiratory: Denies SOB, DOE, cough, wheezing.  Cardiovascular: Denies chest pain, palpitations.  Gastrointestinal: Denies nausea, vomiting, diarrhea, abdominal pain.  Genitourinary: Denies dysuria, increased frequency, flank pain. Endocrine: Denies hot or cold intolerance, polyuria, polydipsia. Musculoskeletal: Denies myalgias, back pain, joint swelling, arthralgias, gait problems.  Skin: Denies pallor, rash, suspicious lesions.  Neurological: Denies dizziness, seizures, syncope, unexplained weakness, lightheadedness, numbness and headaches.  Psychiatric/Behavioral: Denies mood changes, suicidal or homicidal ideations, hallucinations, sleep disturbances.   Objective:    Body mass index is 32.44 kg/m.  Blood pressure (!) 150/93, pulse 63, height '6\' 3"'$  (1.905 m), weight 259 lb 8 oz (117.7 kg).   General Appearance:    Alert, cooperative, no distress, appears stated age  Head:    Normocephalic, without obvious abnormality, atraumatic  Eyes:    PERRL, conjunctiva/corneas clear, EOM's intact, fundi    benign, both eyes  Ears:    Normal TM's and external ear canals, both ears, no rash, no flaking skin in external auditory canals   Nose:   Nares normal, septum midline, mucosa normal, no drainage    or sinus tenderness  Throat:   Lips w/o lesion, mucosa moist, and tongue normal; teeth and gums normal  Neck:   Supple, symmetrical, trachea midline, no adenopathy;    thyroid:   no enlargement/tenderness/nodules; no carotid   bruit or JVD  Back:     Symmetric, no curvature, ROM normal, no CVA tenderness  Lungs:     Clear to auscultation bilaterally, respirations unlabored, no Wh/ R/ R  Chest Wall:    No tenderness or gross deformity; normal excursion   Heart:    Regular rate and rhythm, S1 and S2 normal, no murmur, rub   or gallop  Abdomen:     Soft, non-tender, bowel sounds active all four quadrants, NO G/R/R, no masses, no organomegaly  Genitalia:    Ext genitalia: without lesion, no penile rash or discharge, no hernias appreciated   Rectal:    Normal tone, prostate WNL's and equal b/l, no tenderness; guaiac negative stool  Extremities:   Extremities normal, atraumatic, no cyanosis or gross edema  Pulses:   2+ and symmetric all extremities  Skin:   Warm, dry, Skin color, texture, turgor normal, no Obvious skin lesions: Gets regular dermatology skin screenings yearly.  + Erythematous rash, some flaking skin in large area of occipital region of scalp.    M-Sk:   Ambulates * 4 w/o difficulty, no gross deformities, tone WNL  Neurologic:   CNII-XII intact, normal strength, sensation and reflexes    Throughout Psych:  No HI/SI, judgement and insight good, Euthymic mood. Full Affect.     Recent Results (from the past 2160  hour(s))  TSH     Status: None   Collection Time: 09/10/15  8:02 AM  Result Value Ref Range   TSH 1.54 0.40 - 4.50 mIU/L  Hemoglobin A1c     Status: None   Collection Time: 09/10/15  8:02 AM  Result Value Ref Range   Hgb A1c MFr Bld 5.4 <5.7 %    Comment:   For the purpose of screening for the presence of diabetes:   <5.7%       Consistent with the absence of diabetes 5.7-6.4 %   Consistent with increased risk for diabetes (prediabetes) >=6.5 %     Consistent with diabetes   This assay result is consistent with a decreased risk of diabetes.   Currently, no consensus exists regarding use of hemoglobin A1c for diagnosis of diabetes in  children.   According to American Diabetes Association (ADA) guidelines, hemoglobin A1c <7.0% represents optimal control in non-pregnant diabetic patients. Different metrics may apply to specific patient populations. Standards of Medical Care in Diabetes (ADA).      Mean Plasma Glucose 108 mg/dL  VITAMIN D 25 Hydroxy (Vit-D Deficiency, Fractures)     Status: None   Collection Time: 09/10/15  8:02 AM  Result Value Ref Range   Vit D, 25-Hydroxy 44 30 - 100 ng/mL    Comment: Vitamin D Status           25-OH Vitamin D        Deficiency                <20 ng/mL        Insufficiency         20 - 29 ng/mL        Optimal             > or = 30 ng/mL   For 25-OH Vitamin D testing on patients on D2-supplementation and patients for whom quantitation of D2 and D3 fractions is required, the [facility] 25-OH VIT D, (D2,D3), LC/MS/MS is recommended: order code [phone] (patients > 2 yrs).   Vitamin B12     Status: None   Collection Time: 09/10/15  8:02 AM  Result Value Ref Range   Vitamin B-12 615 200 - 1,100 pg/mL  Phosphorus     Status: None   Collection Time: 09/10/15  8:02 AM  Result Value Ref Range   Phosphorus 3.2 2.5 - 4.5 mg/dL  Magnesium     Status: None   Collection Time: 09/10/15  8:02 AM  Result Value Ref Range   Magnesium 2.0 1.5 - 2.5 mg/dL  Lipid panel     Status: None   Collection Time: 09/10/15  8:02 AM  Result Value Ref Range   Cholesterol 187 125 - 200 mg/dL   Triglycerides 70 <150 mg/dL   HDL 45 >=40 mg/dL   Total CHOL/HDL Ratio 4.2 <=5.0 Ratio   VLDL 14 <30 mg/dL   LDL Cholesterol 128 <130 mg/dL    Comment:   Total Cholesterol/HDL Ratio:CHD Risk                        Coronary Heart Disease Risk Table                                        Men       Women  1/2 Average Risk              3.4        3.3              Average Risk              5.0        4.4           2X Average Risk              9.6        7.1           3X Average Risk             23.4        11.0 Use the calculated Patient Ratio above and the CHD Risk table  to determine the patient's CHD Risk.   RPR     Status: None   Collection Time: 09/10/15  8:02 AM  Result Value Ref Range   RPR Ser Ql NON REAC NON REAC  Sedimentation rate     Status: None   Collection Time: 09/10/15  8:02 AM  Result Value Ref Range   Sed Rate 4 0 - 20 mm/hr  Folate     Status: None   Collection Time: 09/10/15  8:02 AM  Result Value Ref Range   Folate 20.0 >5.4 ng/mL    Comment: Reference Range >17 years:   Low: <3.4 ng/mL              Borderline: 3.4-5.4 ng/mL              Normal: >5.4 ng/mL     COMPLETE METABOLIC PANEL WITH GFR     Status: None   Collection Time: 09/10/15  8:02 AM  Result Value Ref Range   Sodium 139 135 - 146 mmol/L   Potassium 4.1 3.5 - 5.3 mmol/L   Chloride 103 98 - 110 mmol/L   CO2 24 20 - 31 mmol/L   Glucose, Bld 96 65 - 99 mg/dL   BUN 14 7 - 25 mg/dL   Creat 1.31 0.70 - 1.33 mg/dL    Comment:   For patients > or = 52 years of age: The upper reference limit for Creatinine is approximately 13% higher for people identified as African-American.      Total Bilirubin 0.7 0.2 - 1.2 mg/dL   Alkaline Phosphatase 65 40 - 115 U/L   AST 29 10 - 35 U/L   ALT 20 9 - 46 U/L   Total Protein 6.6 6.1 - 8.1 g/dL   Albumin 3.9 3.6 - 5.1 g/dL   Calcium 9.2 8.6 - 10.3 mg/dL   GFR, Est African American 71 >=60 mL/min   GFR, Est Non African American 62 >=60 mL/min  CBC with Differential/Platelet     Status: Abnormal   Collection Time: 09/10/15  8:02 AM  Result Value Ref Range   WBC 4.4 3.8 - 10.8 K/uL   RBC 5.79 4.20 - 5.80 MIL/uL   Hemoglobin 14.3 13.2 - 17.1 g/dL   HCT 46.2 38.5 - 50.0 %   MCV 79.8 (L) 80.0 - 100.0 fL   MCH 24.7 (L) 27.0 - 33.0 pg   MCHC 31.0 (L) 32.0 - 36.0 g/dL   RDW 16.6 (H) 11.0 - 15.0 %   Platelets 245 140 - 400 K/uL   MPV 10.7 7.5 - 12.5 fL   Neutro Abs 2,464 1,500 - 7,800 cells/uL   Lymphs Abs 1,276  850 - 3,900 cells/uL   Monocytes Absolute 528  200 - 950 cells/uL   Eosinophils Absolute 88 15 - 500 cells/uL   Basophils Absolute 44 0 - 200 cells/uL   Neutrophils Relative % 56 %   Lymphocytes Relative 29 %   Monocytes Relative 12 %   Eosinophils Relative 2 %   Basophils Relative 1 %   Smear Review Criteria for review not met   HIV antibody (with reflex)     Status: None   Collection Time: 09/10/15  8:02 AM  Result Value Ref Range   HIV 1&2 Ab, 4th Generation NONREACTIVE NONREACTIVE    Comment:   HIV-1 antigen and HIV-1/HIV-2 antibodies were not detected.  There is no laboratory evidence of HIV infection.   HIV-1/2 Antibody Diff        Not indicated. HIV-1 RNA, Qual TMA          Not indicated.     PLEASE NOTE: This information has been disclosed to you from records whose confidentiality may be protected by state law. If your state requires such protection, then the state law prohibits you from making any further disclosure of the information without the specific written consent of the person to whom it pertains, or as otherwise permitted by law. A general authorization for the release of medical or other information is NOT sufficient for this purpose.   The performance of this assay has not been clinically validated in patients less than 97 years old.   For additional information please refer to http://education.quest[facility]ostics.com/faq/FAQ106.  (This link is being provided for informational/educational purposes only.)

## 2015-10-05 ENCOUNTER — Other Ambulatory Visit: Payer: Self-pay | Admitting: *Deleted

## 2015-10-05 MED ORDER — METOPROLOL SUCCINATE ER 25 MG PO TB24
25.0000 mg | ORAL_TABLET | Freq: Every morning | ORAL | 2 refills | Status: DC
Start: 2015-10-05 — End: 2016-06-29

## 2015-10-23 ENCOUNTER — Ambulatory Visit (INDEPENDENT_AMBULATORY_CARE_PROVIDER_SITE_OTHER): Payer: BLUE CROSS/BLUE SHIELD

## 2015-10-23 VITALS — BP 144/84 | HR 60 | Temp 98.4°F

## 2015-10-23 DIAGNOSIS — Z23 Encounter for immunization: Secondary | ICD-10-CM | POA: Diagnosis not present

## 2015-10-30 DIAGNOSIS — N401 Enlarged prostate with lower urinary tract symptoms: Secondary | ICD-10-CM | POA: Diagnosis not present

## 2015-10-30 LAB — PSA: PSA: 2.41

## 2015-11-04 DIAGNOSIS — N401 Enlarged prostate with lower urinary tract symptoms: Secondary | ICD-10-CM | POA: Diagnosis not present

## 2015-11-04 DIAGNOSIS — I480 Paroxysmal atrial fibrillation: Secondary | ICD-10-CM | POA: Diagnosis not present

## 2015-11-04 DIAGNOSIS — E298 Other testicular dysfunction: Secondary | ICD-10-CM | POA: Diagnosis not present

## 2015-11-04 DIAGNOSIS — D6859 Other primary thrombophilia: Secondary | ICD-10-CM | POA: Diagnosis not present

## 2015-11-04 DIAGNOSIS — E038 Other specified hypothyroidism: Secondary | ICD-10-CM | POA: Diagnosis not present

## 2015-11-04 DIAGNOSIS — I2699 Other pulmonary embolism without acute cor pulmonale: Secondary | ICD-10-CM | POA: Diagnosis not present

## 2015-11-04 DIAGNOSIS — E784 Other hyperlipidemia: Secondary | ICD-10-CM | POA: Diagnosis not present

## 2015-11-05 ENCOUNTER — Other Ambulatory Visit: Payer: Self-pay | Admitting: *Deleted

## 2015-11-05 MED ORDER — EZETIMIBE 10 MG PO TABS: 10.0000 mg | ORAL_TABLET | Freq: Every day | ORAL | 2 refills | Status: DC

## 2015-11-13 DIAGNOSIS — L738 Other specified follicular disorders: Secondary | ICD-10-CM | POA: Diagnosis not present

## 2015-11-30 DIAGNOSIS — N401 Enlarged prostate with lower urinary tract symptoms: Secondary | ICD-10-CM | POA: Diagnosis not present

## 2015-11-30 DIAGNOSIS — R350 Frequency of micturition: Secondary | ICD-10-CM | POA: Diagnosis not present

## 2015-12-09 ENCOUNTER — Encounter: Payer: Self-pay | Admitting: *Deleted

## 2015-12-18 ENCOUNTER — Encounter: Payer: Self-pay | Admitting: Cardiovascular Disease

## 2015-12-18 ENCOUNTER — Ambulatory Visit (INDEPENDENT_AMBULATORY_CARE_PROVIDER_SITE_OTHER): Payer: BLUE CROSS/BLUE SHIELD | Admitting: Cardiovascular Disease

## 2015-12-18 VITALS — BP 136/94 | HR 66 | Ht 75.0 in | Wt 264.1 lb

## 2015-12-18 DIAGNOSIS — I10 Essential (primary) hypertension: Secondary | ICD-10-CM | POA: Diagnosis not present

## 2015-12-18 DIAGNOSIS — I480 Paroxysmal atrial fibrillation: Secondary | ICD-10-CM | POA: Diagnosis not present

## 2015-12-18 DIAGNOSIS — R609 Edema, unspecified: Secondary | ICD-10-CM

## 2015-12-18 DIAGNOSIS — E782 Mixed hyperlipidemia: Secondary | ICD-10-CM | POA: Diagnosis not present

## 2015-12-18 LAB — LIPID PANEL
CHOL/HDL RATIO: 4.9 ratio (ref <5.0)
CHOLESTEROL: 207 mg/dL — AB (ref <200)
HDL: 42 mg/dL (ref >40)
LDL CALC: 152 mg/dL — AB (ref <100)
Triglycerides: 63 mg/dL (ref <150)
VLDL: 13 mg/dL (ref <30)

## 2015-12-18 LAB — COMPREHENSIVE METABOLIC PANEL
ALT: 24 U/L (ref 9–46)
AST: 32 U/L (ref 10–35)
Albumin: 4 g/dL (ref 3.6–5.1)
Alkaline Phosphatase: 72 U/L (ref 40–115)
BUN: 21 mg/dL (ref 7–25)
CHLORIDE: 103 mmol/L (ref 98–110)
CO2: 29 mmol/L (ref 20–31)
Calcium: 9.2 mg/dL (ref 8.6–10.3)
Creat: 1.34 mg/dL — ABNORMAL HIGH (ref 0.70–1.33)
Glucose, Bld: 100 mg/dL — ABNORMAL HIGH (ref 65–99)
POTASSIUM: 4.7 mmol/L (ref 3.5–5.3)
SODIUM: 138 mmol/L (ref 135–146)
TOTAL PROTEIN: 6.6 g/dL (ref 6.1–8.1)
Total Bilirubin: 0.6 mg/dL (ref 0.2–1.2)

## 2015-12-18 MED ORDER — XARELTO 20 MG PO TABS: 20.0000 mg | ORAL_TABLET | Freq: Every day | ORAL | 3 refills | Status: DC

## 2015-12-18 MED ORDER — FUROSEMIDE 20 MG PO TABS: 20.0000 mg | ORAL_TABLET | Freq: Every day | ORAL | 3 refills | Status: DC | PRN

## 2015-12-18 NOTE — Progress Notes (Signed)
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 Yevonne Pax.   Now [name]  Chief Complaint  Patient presents with  . Hypertension   Problem list 1. History of recurrent pulmonary embolus 2. Hypercoagulability 3. Hypertension 4. Osteoarthritis-status post hip replacements 5.  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 Former patient of Dr. Yevonne Pax.   He has a history of prior PE and found to have a hypercoagulability state with abnormal lupus anticoagulant - he is committed to lifelong anticoagulation and is on Xarelto. He has had frequent DVT, PAF, hypothyroidism, HLD and HTN.  He has sleep apnea and is on CPAP - followed by Dr. Mayford Knife.   In January 2014 the patient experienced some chest tightness and underwent a treadmill Myoview stress test which showed no evidence of ischemia and his ejection fraction was 53% with no wall motion abnormalities.   Last seen in November. Cardiac status was stable.   Comes back today. Here alone. He is doing well. He had his hip replacement back in November - did well. Back on track with losing weight - says he is down 20 pounds. BP is lower. No chest pain. Breathing is good. Rhythm is good. Asking about reduction of medicines. His significant other has been diagnosed with breast cancer and he has been helping with her.    June 19, 2015:  Isaiah [name] is seen today for initial visit.   He is a former patient of [facility]. Has hx of PAF - none in the past 6-8 years.    Seems to be triggered by large doses of caffiene.  Has cardioverted on 1 occasion but usually he will convert back into NSR after a day or so .   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   No weight lifting due to a  recent hernia repair .   Dec. 1, 2017:  Isaiah [name] is seen today in follow-up for his paroxysmal atrial fib ablation, diastolic dysfunction, and hypertension. No palps   Past Medical History:  Diagnosis Date  . Blood dyscrasia    lupus anticoagulant  . Diastolic dysfunction    Grade I per echo in 2/12  . DVT (deep venous thrombosis) (HCC) 2000's   "both legs in the calves; left went all the way up to my groin" (09/17/2012)  . Dysrhythmia   . GERD (gastroesophageal reflux disease)   . History of bronchitis   . Hypercoagulation syndrome (HCC)    secondary to circulating lupus anticoagulant  . Hyperlipidemia   . Hypertension   . Hypothyroidism    "wiped out from taking amiodarone" (09/17/2012)  . Lyme disease    history of   . Obesity (BMI 30-39.9) 07/17/2014  . PAF (paroxysmal atrial fibrillation) (HCC)    no recurrence since Feb 2012  . PONV (postoperative nausea and vomiting)    likes scopolomanine patch  . Pulmonary embolism (HCC) 09/17/2012   "just today; 3 on the right; 2 on the left" (09/17/2012)  . Situational stress   . Sleep apnea    uses CPAP    Past Surgical History:  Procedure Laterality Date  . CARDIOVASCULAR STRESS TEST  07/17/2009   EF 59%  . CARDIOVERSION    .  DISTAL BICEPS TENDON REPAIR Right 2011  . INGUINAL HERNIA REPAIR Right 1992  . INGUINAL HERNIA REPAIR Left 2013  . INSERTION OF MESH N/A 05/14/2015   Procedure: INSERTION OF MESH;  Surgeon: Avel Peaceodd Rosenbower, MD;  Location: [facility];  Service: General;  Laterality: N/A;  . SHOULDER ARTHROSCOPY W/ ROTATOR CUFF REPAIR Left 09/04/2012  . SHOULDER SURGERY     left  . TOTAL HIP ARTHROPLASTY Right 11/26/2014   Procedure: RIGHT TOTAL HIP ARTHROPLASTY ANTERIOR APPROACH;  Surgeon: Ollen GrossFrank Aluisio, MD;  Location: [facility];  Service: Orthopedics;  Laterality: Right;  . TRANSTHORACIC ECHOCARDIOGRAM  03/05/2010   EF 60-65%  . UMBILICAL HERNIA REPAIR N/A 05/14/2015   Procedure: HERNIA REPAIR UMBILICAL ADULT WITH MESH ;  Surgeon:  Avel Peaceodd Rosenbower, MD;  Location: [facility];  Service: General;  Laterality: N/A;  . WISDOM TOOTH EXTRACTION     lower 2     Medications: Current Outpatient Prescriptions  Medication Sig Dispense Refill  . ALPRAZolam (XANAX) 0.25 MG tablet Take 1 tablet (0.25 mg total) by mouth 2 (two) times daily as needed for anxiety. (Patient taking differently: Take 0.25 mg by mouth 2 (two) times daily. ) 30 tablet 0  . amLODipine (NORVASC) 5 MG tablet Take 1 tablet (5 mg total) by mouth daily. 90 tablet 2  . ANDROGEL PUMP 20.25 MG/ACT (1.62%) GEL Apply 1 application topically daily.   4  . doxycycline (VIBRAMYCIN) 100 MG capsule Take 100 mg by mouth daily.  0  . ezetimibe (ZETIA) 10 MG tablet Take 1 tablet (10 mg total) by mouth daily. 90 tablet 2  . furosemide (LASIX) 20 MG tablet Take 1 tablet (20 mg total) by mouth daily as needed for fluid. 90 tablet 3  . ibuprofen (ADVIL,MOTRIN) 200 MG tablet Take 200 mg by mouth every 6 (six) hours as needed.    Marland Kitchen. ketoconazole (NIZORAL) 2 % shampoo Applied 2-3 times weekly for several weeks until in remission. Once controlled, use weekly 120 mL 3  . lisinopril (PRINIVIL,ZESTRIL) 20 MG tablet Take 1.5 tablets (30 mg total) by mouth daily. 125 tablet 3  . metoprolol succinate (TOPROL-XL) 25 MG 24 hr tablet Take 1 tablet (25 mg total) by mouth every morning. 90 tablet 2  . omega-3 acid ethyl esters (LOVAZA) 1 g capsule Take 1 capsule (1 g total) by mouth 2 (two) times daily. 360 capsule 3  . omeprazole (PRILOSEC) 20 MG capsule Take 1 capsule (20 mg total) by mouth daily. 30 capsule 0  . propafenone (RYTHMOL SR) 325 MG 12 hr capsule Take 1 capsule (325 mg total) by mouth 2 (two) times daily. 180 capsule 3  . SYNTHROID 150 MCG tablet Take 300 mcg by mouth daily.   0  . triamcinolone cream (KENALOG) 0.1 % Use twice daily as needed itch\irritation only to affected skin 453.6 g 0  . XARELTO 20 MG TABS tablet Take 1 tablet (20 mg total) by mouth daily. 30 tablet 8   No current  facility-administered medications for this visit.     Allergies: Allergies  Allergen Reactions  . Crestor [Rosuvastatin Calcium] Other (See Comments)    hepatitis  . Lipitor [Atorvastatin Calcium] Other (See Comments)    Feels like someone hit him with 2x4    Social History: The patient  reports that he has never smoked. He has never used smokeless tobacco. He reports that he drinks about 4.8 oz of alcohol per week . He reports that he does not use drugs.   Family History: The  patient's family history includes Cancer in his brother and mother; Heart attack in his maternal grandfather; Hyperlipidemia in his mother; Hypertension in his brother, father, and mother.   Review of Systems: Please see the history of present illness.   Otherwise, the review of systems is positive for none.   All other systems are reviewed and negative.   Physical Exam: VS:  BP (!) 136/94 (BP Location: Left Arm, Patient Position: Sitting, Cuff Size: Large)   Pulse 66   Ht 6\' 3"  (1.905 m)   Wt 264 lb 1.9 oz (119.8 kg)   BMI 33.01 kg/m  .  BMI Body mass index is 33.01 kg/m.  Wt Readings from Last 3 Encounters:  12/18/15 264 lb 1.9 oz (119.8 kg)  10/01/15 259 lb 8 oz (117.7 kg)  08/27/15 259 lb (117.5 kg)    General: Pleasant. Well developed, well nourished and in no acute distress. He is down 9 pounds.   HEENT: Normal.  Neck: Supple, no JVD, carotid bruits, or masses noted.  Cardiac: Regular rate and rhythm. His heart tones are distant. No edema.  Respiratory:  Lungs are clear to auscultation bilaterally with normal work of breathing.  GI: Soft and nontender.  MS: No deformity or atrophy. Gait and ROM intact.  Skin: Warm and dry. Color is normal.  Neuro:  Strength and sensation are intact and no gross focal deficits noted.  Psych: Alert, appropriate and with normal affect.   LABORATORY DATA:  EKG:  EKG is not ordered today.  Lab Results  Component Value Date   WBC 4.4 09/10/2015   HGB 14.3  09/10/2015   HCT 46.2 09/10/2015   PLT 245 09/10/2015   GLUCOSE 96 09/10/2015   CHOL 187 09/10/2015   TRIG 70 09/10/2015   HDL 45 09/10/2015   LDLCALC 128 09/10/2015   ALT 20 09/10/2015   AST 29 09/10/2015   NA 139 09/10/2015   K 4.1 09/10/2015   CL 103 09/10/2015   CREATININE 1.31 09/10/2015   BUN 14 09/10/2015   CO2 24 09/10/2015   TSH 1.54 09/10/2015   INR 1.17 05/13/2015   HGBA1C 5.4 09/10/2015    BNP (last 3 results) No results for input(s): BNP in the last 8760 hours.  ProBNP (last 3 results) No results for input(s): PROBNP in the last 8760 hours.   Other Studies Reviewed Today:  Echo Study Conclusions from 2014  Left ventricle: The cavity size was normal. Wall thickness was increased in a pattern of mild LVH. Systolic function was normal. The estimated ejection fraction was in the range of 55% to 60%. There was an increased relative contribution of atrial contraction to ventricular filling.   Myoview Impression from 2014 Exercise Capacity: Good exercise capacity. BP Response: Normal blood pressure response. Clinical Symptoms: Mild dyspnea ECG Impression: No significant ST segment change suggestive of ischemia. Comparison with Prior Nuclear Study: No significant change from previous study  Overall Impression: Low risk stress nuclear study. No evidence for ischemia or infarction. EF mildly decreased.   LV Ejection Fraction: 53%. LV Wall Motion: Mild global hypokinesis.   Marca Ancona 02/06/2012  ECG:   NSR at 66.  Normal ECG   Assessment/Plan: 1. Hypercoagulable syndrome secondary to circulating lupus anticoagulant. Past history of pulmonary emboli and past history of DVTs. He is on lifelong anticoagulation with Xarelto. No problems noted.  2. Paroxysmal atrial fibrillation, currently maintaining normal sinus rhythm. Continue rythmol   3. Hypercholesterolemia - followed by primary care.   4. Essential hypertension -  BP much lower  continue with weight loss efforts.  5. Hypothyroidism [place]  6. Osteoarthritis of right hip. Now s/p THR. Doing well.   Current medicines are reviewed with the patient today.  The patient does not have concerns regarding medicines other than what has been noted above.    Patient is agreeable to this plan and will call if any problems develop in the interim.   Will see him in 6 months .   Kristeen MissPhilip [name], MD  12/18/2015 8:55 AM    [facility] [address],  [address] [place], [place]  [address] Pager [phone] Phone: [phone]) [phone]; Fax: [phone]

## 2015-12-18 NOTE — Patient Instructions (Signed)
Medication Instructions:  Your physician recommends that you continue on your current medications as directed. Please refer to the Current Medication list given to you today.   Labwork: TODAY - cholesterol, complete metabolic panel  Your physician recommends that you return for lab work in: 6 months on the day of or a few days before your office visit with Dr. [name].  You will need to FAST for this appointment - nothing to eat or drink after midnight the night before except water.   Testing/Procedures: None Ordered   Follow-Up: Your physician wants you to follow-up in: 6 months with Dr. [name].  You will receive a reminder letter in the mail two months in advance. If you don't receive a letter, please call our office to schedule the follow-up appointment.   If you need a refill on your cardiac medications before your next appointment, please call your pharmacy.   Thank you for choosing [facility]! Velva Molinari, RN [phone]    

## 2016-02-02 ENCOUNTER — Other Ambulatory Visit: Payer: Self-pay | Admitting: *Deleted

## 2016-02-02 MED ORDER — LISINOPRIL 20 MG PO TABS
30.0000 mg | ORAL_TABLET | Freq: Every day | ORAL | 0 refills | Status: DC
Start: 2016-02-02 — End: 2016-04-09

## 2016-02-16 DIAGNOSIS — G4733 Obstructive sleep apnea (adult) (pediatric): Secondary | ICD-10-CM | POA: Diagnosis not present

## 2016-03-08 DIAGNOSIS — E291 Testicular hypofunction: Secondary | ICD-10-CM | POA: Diagnosis not present

## 2016-03-08 DIAGNOSIS — E038 Other specified hypothyroidism: Secondary | ICD-10-CM | POA: Diagnosis not present

## 2016-03-08 DIAGNOSIS — I480 Paroxysmal atrial fibrillation: Secondary | ICD-10-CM | POA: Diagnosis not present

## 2016-03-09 ENCOUNTER — Other Ambulatory Visit: Payer: Self-pay | Admitting: *Deleted

## 2016-03-09 MED ORDER — OMEGA-3-ACID ETHYL ESTERS 1 G PO CAPS: 1.0000 g | ORAL_CAPSULE | Freq: Two times a day (BID) | ORAL | 3 refills | Status: DC

## 2016-03-23 DIAGNOSIS — M24551 Contracture, right hip: Secondary | ICD-10-CM | POA: Diagnosis not present

## 2016-03-23 DIAGNOSIS — Z96641 Presence of right artificial hip joint: Secondary | ICD-10-CM | POA: Diagnosis not present

## 2016-03-23 DIAGNOSIS — Z471 Aftercare following joint replacement surgery: Secondary | ICD-10-CM | POA: Diagnosis not present

## 2016-03-25 DIAGNOSIS — M24551 Contracture, right hip: Secondary | ICD-10-CM | POA: Diagnosis not present

## 2016-03-30 ENCOUNTER — Ambulatory Visit: Payer: BLUE CROSS/BLUE SHIELD | Admitting: Family Medicine

## 2016-04-01 DIAGNOSIS — L570 Actinic keratosis: Secondary | ICD-10-CM | POA: Diagnosis not present

## 2016-04-01 DIAGNOSIS — L821 Other seborrheic keratosis: Secondary | ICD-10-CM | POA: Diagnosis not present

## 2016-04-01 DIAGNOSIS — D225 Melanocytic nevi of trunk: Secondary | ICD-10-CM | POA: Diagnosis not present

## 2016-04-01 DIAGNOSIS — L812 Freckles: Secondary | ICD-10-CM | POA: Diagnosis not present

## 2016-04-01 DIAGNOSIS — D2271 Melanocytic nevi of right lower limb, including hip: Secondary | ICD-10-CM | POA: Diagnosis not present

## 2016-04-09 ENCOUNTER — Other Ambulatory Visit: Payer: Self-pay | Admitting: Cardiology

## 2016-05-16 ENCOUNTER — Ambulatory Visit (INDEPENDENT_AMBULATORY_CARE_PROVIDER_SITE_OTHER): Payer: BLUE CROSS/BLUE SHIELD | Admitting: Family Medicine

## 2016-05-16 ENCOUNTER — Encounter: Payer: Self-pay | Admitting: Family Medicine

## 2016-05-16 VITALS — BP 130/86 | HR 56 | Ht 75.0 in | Wt 255.0 lb

## 2016-05-16 DIAGNOSIS — F438 Other reactions to severe stress: Secondary | ICD-10-CM

## 2016-05-16 DIAGNOSIS — K219 Gastro-esophageal reflux disease without esophagitis: Secondary | ICD-10-CM | POA: Diagnosis not present

## 2016-05-16 DIAGNOSIS — Z7901 Long term (current) use of anticoagulants: Secondary | ICD-10-CM

## 2016-05-16 DIAGNOSIS — F4389 Other reactions to severe stress: Secondary | ICD-10-CM

## 2016-05-16 DIAGNOSIS — E038 Other specified hypothyroidism: Secondary | ICD-10-CM | POA: Diagnosis not present

## 2016-05-16 DIAGNOSIS — L219 Seborrheic dermatitis, unspecified: Secondary | ICD-10-CM

## 2016-05-16 DIAGNOSIS — E785 Hyperlipidemia, unspecified: Secondary | ICD-10-CM | POA: Diagnosis not present

## 2016-05-16 DIAGNOSIS — G4733 Obstructive sleep apnea (adult) (pediatric): Secondary | ICD-10-CM

## 2016-05-16 DIAGNOSIS — I10 Essential (primary) hypertension: Secondary | ICD-10-CM | POA: Diagnosis not present

## 2016-05-16 MED ORDER — ALPRAZOLAM 0.25 MG PO TABS: 0.2500 mg | ORAL_TABLET | Freq: Two times a day (BID) | ORAL | 0 refills | Status: DC | PRN

## 2016-05-16 MED ORDER — EZETIMIBE 10 MG PO TABS: 10.0000 mg | ORAL_TABLET | Freq: Every day | ORAL | 3 refills | Status: DC

## 2016-05-16 MED ORDER — ALPRAZOLAM 0.25 MG PO TABS: 0.2500 mg | ORAL_TABLET | Freq: Two times a day (BID) | ORAL | 0 refills | Status: AC | PRN

## 2016-05-16 MED ORDER — LISINOPRIL 20 MG PO TABS: 20.0000 mg | ORAL_TABLET | Freq: Two times a day (BID) | ORAL | 3 refills | Status: DC

## 2016-05-16 MED ORDER — AMLODIPINE BESYLATE 5 MG PO TABS: 5.0000 mg | ORAL_TABLET | Freq: Every day | ORAL | 3 refills | Status: DC

## 2016-05-16 NOTE — Assessment & Plan Note (Signed)
He has sleep apnea and is on CPAP - followed by Dr. Mayford Knife.

## 2016-05-16 NOTE — Assessment & Plan Note (Addendum)
Last FLP was Dec 2017--> LDL little up   - prudent diet and exercise r/w pt  - meds per Cards

## 2016-05-16 NOTE — Progress Notes (Signed)
Impression and Recommendations:    1. Chronic anticoagulation- abnormal lupus anticoagulant   2. OSA (obstructive sleep apnea)   3. Other reactions to severe stress   4. Dyslipidemia   5. Gastroesophageal reflux disease, esophagitis presence not specified   6. Essential hypertension   7. Other specified hypothyroidism   8. Seborrheic dermatitis of scalp      OSA (obstructive sleep apnea) He has sleep apnea and is on CPAP - followed by Dr. Mayford Knife.  Dyslipidemia Last FLP was Dec 2017--> LDL little up   - prudent diet and exercise r/w pt  - meds per Cards   Chronic anticoagulation- abnormal lupus anticoagulant On Xarelto per cardiology  Pt prefers to follow with them regularly     HTN (hypertension) Recommend low salt and eat a heart healthy\Mediterranean type diet.   Recommend regular exercise  Blood pressure medical management via cardiology Dr. Elease Hashimoto.    Other reactions to severe stress Xanax RFed for pt  Sx- stable on meds  Tolerance issues d/c pt and no evidence of it    Hypothyroidism  patient sees Dr. Evlyn Kanner of endocrinology regularly for mgt of this per his preference  GERD (gastroesophageal reflux disease) Well controlled.      Continue omeprazole.             Seborrheic dermatitis of scalp Pt requests med RF, Asx at current   The patient was counseled, risk factors were discussed, anticipatory guidance given.   New Prescriptions   No medications on file   Meds ordered this encounter  Medications  . DISCONTD: ALPRAZolam (XANAX) 0.25 MG tablet    Sig: Take 1 tablet (0.25 mg total) by mouth 2 (two) times daily as needed.    Dispense:  60 tablet    Refill:  0  . amLODipine (NORVASC) 5 MG tablet    Sig: Take 1 tablet (5 mg total) by mouth daily.    Dispense:  90 tablet    Refill:  3  . ezetimibe (ZETIA) 10 MG tablet    Sig: Take 1 tablet (10 mg total) by mouth daily.    Dispense:  90 tablet    Refill:  3  . lisinopril  (PRINIVIL,ZESTRIL) 20 MG tablet    Sig: Take 1 tablet (20 mg total) by mouth 2 (two) times daily.    Dispense:  180 tablet    Refill:  3  . DISCONTD: ALPRAZolam (XANAX) 0.25 MG tablet    Sig: Take 1 tablet (0.25 mg total) by mouth 2 (two) times daily as needed.    Dispense:  60 tablet    Refill:  0  . DISCONTD: ALPRAZolam (XANAX) 0.25 MG tablet    Sig: Take 1 tablet (0.25 mg total) by mouth 2 (two) times daily as needed.    Dispense:  60 tablet    Refill:  0  . ALPRAZolam (XANAX) 0.25 MG tablet    Sig: Take 1 tablet (0.25 mg total) by mouth 2 (two) times daily as needed.    Dispense:  60 tablet    Refill:  0    Gross side effects, risk and benefits, and alternatives of medications and treatment plan in general discussed with patient.  Patient is aware that all medications have potential side effects and we are unable to predict every side effect or drug-drug interaction that may occur.   Patient will call with any questions prior to using medication if they have concerns.  Expresses verbal understanding and consents to  current therapy and treatment regimen.  No barriers to understanding were identified.  Red flag symptoms and signs discussed in detail.  Patient expressed understanding regarding what to do in case of emergency\urgent symptoms  Please see AVS handed out to patient at the end of our visit for further patient instructions/ counseling done pertaining to today's office visit.   Return in about 4 months (around 09/15/2016) for compete physical, come fasting- around Sept. .     Note: This document was prepared using Dragon voice recognition software and may include unintentional dictation errors.   --------------------------------------------------------------------------------------------------------------------------------------------------------------------------------------------------------------------------------------------    Subjective:    CC:  Chief Complaint    Patient presents with  . 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  Reviewed recent labs with pt done 12/17.  Pt will get me records from other specialists Derm, Urology, Endo etc    HTN:  Doing well, no complaints, BP well controlled.  On meds- compliant with txmnt  anticoag:  No sx or complaints, no bleeding d/o/ problems  Stress: well controlled with xanax daily.  Only thing that has worked for pt in the past.  Declines trial of SSRI's esp due to potential s-e which he fears.  Sign other doing well with breast ca txmnts  Wt:   stable, going to gym, wants to lose a little  hypothy:   Sees Endo- which he prefers.   No complaints,   Labs stable. Compliant w meds   Wt Readings from Last 3 Encounters:  05/16/16 255 lb 0.6 oz (115.7 kg)  12/18/15 264 lb 1.9 oz (119.8 kg)  10/01/15 259 lb 8 oz (117.7 kg)   BP Readings from Last 3 Encounters:  05/16/16 130/86  12/18/15 (!) 136/94  10/23/15 (!) 144/84   Pulse Readings from Last 3 Encounters:  05/16/16 (!) 56  12/18/15 66  10/23/15 60   BMI Readings from Last 3 Encounters:  05/16/16 31.88 kg/m  12/18/15 33.01 kg/m  10/01/15 32.44 kg/m     Patient Care Team    Relationship Specialty Notifications Start End  [name], DO PCP - General Family Medicine  08/26/15   Arminda Resides, MD Consulting Physician Dermatology  10/01/15   Quintella Reichert, MD Consulting Physician Cardiology  10/01/15   Vesta Mixer, MD Consulting Physician Cardiology  10/01/15   Adrian Prince, MD Consulting Physician Endocrinology  05/16/16   Jethro Bolus, MD Consulting Physician Urology  05/16/16      Patient Active Problem List   Diagnosis Date Noted  . Chronic anticoagulation- abnormal lupus anticoagulant 08/27/2015    Priority: High  . Other reactions to severe stress 08/27/2015    Priority: High  . Dyslipidemia 05/10/2013     Priority: High  . HTN (hypertension) 10/01/2010    Priority: High  . h/o PE (pulmonary embolism) 09/17/2012    Priority: Medium  . Personal history of multiple DVTs (deep vein thrombosis) 08/27/2015    Priority: Low  . Hypothyroidism 12/17/2010    Priority: Low  . Seborrheic dermatitis of scalp 10/01/2015  . Osteoarthritis of multiple joints 08/27/2015  . CPAP (continuous positive airway pressure) dependence 08/27/2015  . GERD (gastroesophageal reflux disease) 08/27/2015  . h/o Lyme disease- 2014 tick bite 08/27/2015  . Memory changes- since Lyme Dx 08/27/2015  . Obesity (BMI 30-39.9) 07/17/2014  . OSA (obstructive sleep apnea) 05/02/2014  . History of chronic  Myalgias since Lyme Dx 2014 05/10/2013  . PAF (paroxysmal atrial fibrillation) (HCC) 10/01/2010  . High risk medication use 10/01/2010    Past Medical history, Surgical history, Family history, Social history, Allergies and Medications have been entered into the medical record, reviewed and changed as needed.    Current Meds  Medication Sig  . [START ON 08/15/2016] ALPRAZolam (XANAX) 0.25 MG tablet Take 1 tablet (0.25 mg total) by mouth 2 (two) times daily as needed.  Marland Kitchen amLODipine (NORVASC) 5 MG tablet Take 1 tablet (5 mg total) by mouth daily.  Marland Kitchen doxycycline (VIBRAMYCIN) 100 MG capsule Take 100 mg by mouth daily.  Marland Kitchen ezetimibe (ZETIA) 10 MG tablet Take 1 tablet (10 mg total) by mouth daily.  Marland Kitchen ibuprofen (ADVIL,MOTRIN) 200 MG tablet Take 200 mg by mouth every 6 (six) hours as needed.  Marland Kitchen lisinopril (PRINIVIL,ZESTRIL) 20 MG tablet Take 1 tablet (20 mg total) by mouth 2 (two) times daily.  . metoprolol succinate (TOPROL-XL) 25 MG 24 hr tablet Take 1 tablet (25 mg total) by mouth every morning.  Marland Kitchen omega-3 acid ethyl esters (LOVAZA) 1 g capsule Take 1 capsule (1 g total) by mouth 2 (two) times daily.  Marland Kitchen omeprazole (PRILOSEC) 20 MG capsule Take 1 capsule (20 mg total) by mouth daily.  . propafenone (RYTHMOL SR) 325 MG 12 hr capsule  Take 1 capsule (325 mg total) by mouth 2 (two) times daily.  Marland Kitchen SYNTHROID 150 MCG tablet Take 300 mcg by mouth daily.   Carlena Hurl 20 MG TABS tablet Take 1 tablet (20 mg total) by mouth daily.  . [DISCONTINUED] ALPRAZolam (XANAX) 0.25 MG tablet Take 1 tablet (0.25 mg total) by mouth 2 (two) times daily as needed for anxiety. (Patient taking differently: Take 0.25 mg by mouth 2 (two) times daily. )  . [DISCONTINUED] ALPRAZolam (XANAX) 0.25 MG tablet Take 1 tablet (0.25 mg total) by mouth 2 (two) times daily as needed.  . [DISCONTINUED] ALPRAZolam (XANAX) 0.25 MG tablet Take 1 tablet (0.25 mg total) by mouth 2 (two) times daily as needed.  . [DISCONTINUED] ALPRAZolam (XANAX) 0.25 MG tablet Take 1 tablet (0.25 mg total) by mouth 2 (two) times daily as needed.  . [DISCONTINUED] amLODipine (NORVASC) 5 MG tablet Take 1 tablet (5 mg total) by mouth daily.  . [DISCONTINUED] ezetimibe (ZETIA) 10 MG tablet Take 1 tablet (10 mg total) by mouth daily.  . [DISCONTINUED] lisinopril (PRINIVIL,ZESTRIL) 20 MG tablet TAKE 1 AND 1/2 TABLETS BY MOUTH DAILY  . [DISCONTINUED] triamcinolone cream (KENALOG) 0.1 % Use twice daily as needed itch\irritation only to affected skin    Allergies:  Allergies  Allergen Reactions  . Crestor [Rosuvastatin Calcium] Other (See Comments)    hepatitis  . Lipitor [Atorvastatin Calcium] Other (See Comments)    Feels like someone hit him with 2x4     Review of Systems: General:   Denies fever, chills, unexplained weight loss.  Optho/Auditory:   Denies visual changes, blurred vision/LOV Respiratory:   Denies wheeze, DOE more than baseline levels.  Cardiovascular:   Denies chest pain, palpitations, new onset peripheral edema  Gastrointestinal:   Denies nausea, vomiting, diarrhea, abd pain.  Genitourinary: Denies dysuria, freq/ urgency, flank pain or discharge from genitals.  Endocrine:     Denies hot or cold intolerance, polyuria, polydipsia. Musculoskeletal:   Denies unexplained  myalgias, joint swelling, unexplained arthralgias, gait problems.  Skin:  Denies new onset rash, suspicious lesions Neurological:     Denies dizziness, unexplained weakness, numbness  Psychiatric/Behavioral:  Denies mood changes, suicidal or homicidal ideations, hallucinations       Objective:   Blood pressure 130/86, pulse (!) 56, height  (1.905 m), weight 255 lb 0.6 oz (115.7 kg), SpO2 97 %. Body mass index is 31.88 kg/m. General:  Well Developed, well nourished, appropriate for stated age.  Neuro:  Alert and oriented,  extra-ocular muscles intact  HEENT:  Normocephalic, atraumatic, neck supple, no carotid bruits appreciated  Skin:  no gross rash, warm, pink. Cardiac:  RRR, S1 S2 Respiratory:  ECTA B/L and A/P, Not using accessory muscles, speaking in full sentences- unlabored. Vascular:  Ext warm, no cyanosis apprec.; cap RF less 2 sec. Psych:  No HI/SI, judgement and insight good, Euthymic mood. Full Affect.

## 2016-05-16 NOTE — Patient Instructions (Signed)
Top Ten Foods for Health  1. Water Drink at least 8 to 12 cups of water daily. Consume half of your body weight in pounds, is the amount of water in ounces to drink daily.  Ie: a 200lb person = 100 oz water daily  2. Dark Green Vegetables Eat dark green vegetables at least three to four times a week. Good options include broccoli, peppers, brussel sprouts and leafy greens like kale and spinach.  3. Whole Grains Whole grains should be included in your diet at least two to three times daily. Look for whole wheat flour, rye, oatmeal, barley, amaranth, quinoa or a multigrain. A good source of fiber includes 3 to 4 grams of fiber per serving. A great source has 5 or more grams of fiber per serving.  4. Beans and Lentils Try to eat a bean-based meal at least once a week. Try to add legumes, including beans and lentils, to soups, stews, casseroles, salads and dips or eat them plain.  5. Fish Try to eat two to three serving of fish a week. A serving consists of 3 to 4 ounces of cooked fish. Good choices are salmon, trout, herring, bluefish, sardines and tuna.  6. Berries Include two to four servings of fruit in your diet each day. Try to eat berries such as raspberries, blueberries, blackberries and strawberries.  7. Winter Squash Eat butternut and acorn squash as well as other richly pigmented dark orange and green colored vegetables like sweet potato, cantaloupe and mango.  8. Soy 25 grams of soy protein a day is recommended as part of a low-fat diet to help lower cholesterol levels. Try tofu, soymilk, edamame soybeans, tempeh and texturized vegetable protein (TVP).  9. Flaxseed, Nuts and Seeds Add 1 to 2 tablespoons of ground flaxseed or other seeds to food each day or include a moderate amount of nuts - 1/4 cup - in your daily diet.  10. Organic Yogurt Men and women between 19 and 50 years of age need 1000 milligrams of calcium a day and 1200 milligrams if 50 or older. Eat calcium-rich  foods such as nonfat or low-fat dairy products three to four times a day. Include organic choices.  

## 2016-05-17 ENCOUNTER — Telehealth: Payer: Self-pay | Admitting: Family Medicine

## 2016-05-17 NOTE — Assessment & Plan Note (Signed)
patient sees Dr. Evlyn Kanner of endocrinology regularly for [facility] of this per his preference

## 2016-05-17 NOTE — Assessment & Plan Note (Signed)
Recommend low salt and eat a heart healthy\Mediterranean type diet.   Recommend regular exercise  Blood pressure medical management via cardiology Dr. [name].   

## 2016-05-17 NOTE — Assessment & Plan Note (Signed)
Pt requests med RF, Asx at current

## 2016-05-17 NOTE — Assessment & Plan Note (Signed)
Xanax RFed for pt  Sx- stable on meds  Tolerance issues d/c pt and no evidence of it

## 2016-05-17 NOTE — Assessment & Plan Note (Signed)
On Xarelto per cardiology  Pt prefers to follow with them regularly

## 2016-05-17 NOTE — Assessment & Plan Note (Signed)
Well-controlled.  Continue omeprazole. 

## 2016-05-17 NOTE — Telephone Encounter (Signed)
Can you please call and ask pt exactly what he takes doxy for and exact sig of medicine please. - thanks for lettin gme know

## 2016-05-18 NOTE — Telephone Encounter (Signed)
Pt stated he takes Doxy for scalp irritation. He takes 100 mg capsules 2 x daily. Please advise?

## 2016-05-18 NOTE — Telephone Encounter (Signed)
Since Derm prescribed these meds for pt in the past, I do not have a dx code to attribute this script to.    So, we will need to either contact his Derm doc and get diag codes and exact sig of med  OR  pt can opt to contact his Derm doc directly for RF of this med.   Scalp irritation is not a diagnosis that would warrant such a script.  thnx

## 2016-05-19 NOTE — Telephone Encounter (Signed)
LVM requesting pt to call the office.  

## 2016-05-19 NOTE — Telephone Encounter (Signed)
Pt called and stated that he hasn't seen a dermatologist in a while and that he has been writing his own prescription for it. Pt stated that he does not know what dx code they used. Pt's dermatologist was Dr. Karlyn Ageean [place] in [place]. Please advise?

## 2016-05-20 NOTE — Telephone Encounter (Signed)
We will have to call his Derm office and get the notes from them in order to know why he takes it.  I don't have problem prescribing the med, but I need to have a valid medical reason documented for why he takes it.    - please let me know when you get the faxed med records from them -   thnks

## 2016-05-23 NOTE — Telephone Encounter (Signed)
LVM for Dr. Yetta BarreJones MR department to call.  Tiajuana Amass. Kaileigh Viswanathan, CMA

## 2016-05-25 ENCOUNTER — Telehealth: Payer: Self-pay | Admitting: Family Medicine

## 2016-05-25 NOTE — Telephone Encounter (Signed)
Please call Dr. Yetta BarreJones' office and follow up on obtaining medical records.

## 2016-05-27 ENCOUNTER — Ambulatory Visit (INDEPENDENT_AMBULATORY_CARE_PROVIDER_SITE_OTHER): Payer: BLUE CROSS/BLUE SHIELD | Admitting: Cardiology

## 2016-05-27 ENCOUNTER — Encounter: Payer: Self-pay | Admitting: Cardiology

## 2016-05-27 VITALS — BP 116/64 | HR 68 | Resp 16 | Ht 75.0 in | Wt 259.0 lb

## 2016-05-27 DIAGNOSIS — E669 Obesity, unspecified: Secondary | ICD-10-CM | POA: Diagnosis not present

## 2016-05-27 DIAGNOSIS — G4733 Obstructive sleep apnea (adult) (pediatric): Secondary | ICD-10-CM

## 2016-05-27 DIAGNOSIS — I10 Essential (primary) hypertension: Secondary | ICD-10-CM

## 2016-05-27 NOTE — Patient Instructions (Signed)

## 2016-05-27 NOTE — Progress Notes (Signed)
Cardiology Office Note    Date:  05/27/2016   ID:  [name], Isaiah [name], DOB 04-06-1960, MRN [id number]  PCP:  [name], DO  Cardiologist:  Armanda Magic, MD   Chief Complaint  Patient presents with  . Sleep Apnea    History of Present Illness:  Isaiah [name], Isaiah [name] is a 56 y.o. male who presents for followup of sleep apnea. He has severe OSA with an AHI of 33 events per hour. He had oxygen desaturations as low as 82%. His Epworth sleepiness scale was elevated at 18 prior to going on CPAP. He is doing well with his device and is on CPAP at 14cm H2O.  He tolerates the nasal pillow mask with chin strap and feels the pressure is adequate. He continues to feel rested in the am and has no daytime sleepiness.He does not think he is snoring.   He denies any mouth dryness and uses a chin strap.    Past Medical History:  Diagnosis Date  . Blood dyscrasia    lupus anticoagulant  . Diastolic dysfunction    Grade I per echo in 2/12  . DVT (deep venous thrombosis) (HCC) 2000's   "both legs in the calves; left went all the way up to my groin" (09/17/2012)  . Dysrhythmia   . GERD (gastroesophageal reflux disease)   . History of bronchitis   . Hypercoagulation syndrome (HCC)    secondary to circulating lupus anticoagulant  . Hyperlipidemia   . Hypertension   . Hypothyroidism    "wiped out from taking amiodarone" (09/17/2012)  . Lyme disease    history of   . Obesity (BMI 30-39.9) 07/17/2014  . PAF (paroxysmal atrial fibrillation) (HCC)    no recurrence since Feb 2012  . PONV (postoperative nausea and vomiting)    likes scopolomanine patch  . Pulmonary embolism (HCC) 09/17/2012   "just today; 3 on the right; 2 on the left" (09/17/2012)  . Situational stress   . Sleep apnea    uses CPAP    Past Surgical History:  Procedure Laterality Date  . CARDIOVASCULAR STRESS TEST  07/17/2009   EF 59%  . CARDIOVERSION    . DISTAL BICEPS TENDON REPAIR Right 2011  . INGUINAL HERNIA REPAIR  Right 1992  . INGUINAL HERNIA REPAIR Left 2013  . INSERTION OF MESH N/A 05/14/2015   Procedure: INSERTION OF MESH;  Surgeon: Avel Peace, MD;  Location: [facility];  Service: General;  Laterality: N/A;  . SHOULDER ARTHROSCOPY W/ ROTATOR CUFF REPAIR Left 09/04/2012  . SHOULDER SURGERY     left  . TOTAL HIP ARTHROPLASTY Right 11/26/2014   Procedure: RIGHT TOTAL HIP ARTHROPLASTY ANTERIOR APPROACH;  Surgeon: Ollen Gross, MD;  Location: [facility];  Service: Orthopedics;  Laterality: Right;  . TRANSTHORACIC ECHOCARDIOGRAM  03/05/2010   EF 60-65%  . UMBILICAL HERNIA REPAIR N/A 05/14/2015   Procedure: HERNIA REPAIR UMBILICAL ADULT WITH MESH ;  Surgeon: Avel Peace, MD;  Location: [facility];  Service: General;  Laterality: N/A;  . WISDOM TOOTH EXTRACTION     lower 2    Current Medications: Current Meds  Medication Sig  . [START ON 08/15/2016] ALPRAZolam (XANAX) 0.25 MG tablet Take 1 tablet (0.25 mg total) by mouth 2 (two) times daily as needed.  Marland Kitchen amLODipine (NORVASC) 5 MG tablet Take 1 tablet (5 mg total) by mouth daily.  Marland Kitchen doxycycline (VIBRAMYCIN) 100 MG capsule Take 100 mg by mouth daily.  Marland Kitchen ezetimibe (ZETIA) 10 MG tablet Take 1 tablet (  10 mg total) by mouth daily.  . furosemide (LASIX) 20 MG tablet Take 1 tablet (20 mg total) by mouth daily as needed for fluid.  Marland Kitchen ibuprofen (ADVIL,MOTRIN) 200 MG tablet Take 200 mg by mouth every 6 (six) hours as needed.  Marland Kitchen ketoconazole (NIZORAL) 2 % shampoo Applied 2-3 times weekly for several weeks until in remission. Once controlled, use weekly  . lisinopril (PRINIVIL,ZESTRIL) 20 MG tablet Take 1 tablet (20 mg total) by mouth 2 (two) times daily.  . metoprolol succinate (TOPROL-XL) 25 MG 24 hr tablet Take 1 tablet (25 mg total) by mouth every morning.  Marland Kitchen omega-3 acid ethyl esters (LOVAZA) 1 g capsule Take 1 capsule (1 g total) by mouth 2 (two) times daily.  Marland Kitchen omeprazole (PRILOSEC) 20 MG capsule Take 1 capsule (20 mg total) by mouth daily.  . propafenone  (RYTHMOL SR) 325 MG 12 hr capsule Take 1 capsule (325 mg total) by mouth 2 (two) times daily.  Marland Kitchen SYNTHROID 150 MCG tablet Take 300 mcg by mouth daily.   Carlena Hurl 20 MG TABS tablet Take 1 tablet (20 mg total) by mouth daily.    Allergies:   Crestor [rosuvastatin calcium] and Lipitor [atorvastatin calcium]   Social History   Social History  . Marital status: Divorced    Spouse name: N/A  . Number of children: N/A  . Years of education: N/A   Social History Main Topics  . Smoking status: Never Smoker  . Smokeless tobacco: Never Used  . Alcohol use 4.8 oz/week    4 Cans of beer, 4 Shots of liquor per week     Comment: 09/17/2012 "1-2 beers and 1-2 tequila on Fri and Sat nights"  . Drug use: No  . Sexual activity: Yes   Other Topics Concern  . None   Social History Narrative  . None     Family History:  The patient's family history includes Cancer in his brother and mother; Heart attack in his maternal grandfather; Hyperlipidemia in his mother; Hypertension in his brother, father, and mother.   ROS:   Please see the history of present illness.    ROS All other systems reviewed and are negative.  No flowsheet data found.     PHYSICAL EXAM:   VS:  BP 116/64   Pulse 68   Resp 16   Ht 6\' 3"  (1.905 m)   Wt 259 lb (117.5 kg)   SpO2 97%   BMI 32.37 kg/m    GEN: Well nourished, well developed, in no acute distress  HEENT: normal  Neck: no JVD, carotid bruits, or masses Cardiac: RRR; no murmurs, rubs, or gallops,no edema.  Intact distal pulses bilaterally.  Respiratory:  clear to auscultation bilaterally, normal work of breathing GI: soft, nontender, nondistended, + BS MS: no deformity or atrophy  Skin: warm and dry, no rash Neuro:  Alert and Oriented x 3, Strength and sensation are intact Psych: euthymic mood, full affect  Wt Readings from Last 3 Encounters:  05/27/16 259 lb (117.5 kg)  05/16/16 255 lb 0.6 oz (115.7 kg)  12/18/15 264 lb 1.9 oz (119.8 kg)       Studies/Labs Reviewed:   EKG:  EKG is not ordered today.    Recent Labs: 09/10/2015: Hemoglobin 14.3; Magnesium 2.0; Platelets 245; TSH 1.54 12/18/2015: ALT 24; BUN 21; Creat 1.34; Potassium 4.7; Sodium 138   Lipid Panel    Component Value Date/Time   CHOL 207 (H) 12/18/2015 0915   CHOL 206 (H) 05/10/2013 1037  TRIG 63 12/18/2015 0915   HDL 42 12/18/2015 0915   HDL 47 05/10/2013 1037   CHOLHDL 4.9 12/18/2015 0915   VLDL 13 12/18/2015 0915   LDLCALC 152 (H) 12/18/2015 0915   LDLCALC 147 (H) 05/10/2013 1037    Additional studies/ records that were reviewed today include:  CPAP    ASSESSMENT:    1. OSA (obstructive sleep apnea)   2. Essential hypertension   3. Obesity (BMI 30-39.9)      PLAN:  In order of problems listed above:  OSA - the patient is tolerating PAP therapy well without any problems. The PAP download was reviewed today and showed an AHI of 0.3/hr on 14 cm H2O with 97% compliance in using more than 4 hours nightly.  The patient has been using and benefiting from CPAP use and will continue to benefit from therapy.  HTN - BP is controlled on current meds.  He will continue on amlodipine, ACE I and BB. Obesity - I have encouraged him to get into a routine exercise program and cut back on carbs and portions.      Medication Adjustments/Labs and Tests Ordered: Current medicines are reviewed at length with the patient today.  Concerns regarding medicines are outlined above.  Medication changes, Labs and Tests ordered today are listed in the Patient Instructions below.  Patient Instructions  Medication Instructions:  Your physician recommends that you continue on your current medications as directed. Please refer to the Current Medication list given to you today.   Labwork: None  Testing/Procedures: None  Follow-Up: Your physician wants you to follow-up in: 1 year with Dr. Mayford Knifeurner. You will receive a reminder letter in the mail two months in advance.  If you don't receive a letter, please call our office to schedule the follow-up appointment.   Any Other Special Instructions Will Be Listed Below (If Applicable).     If you need a refill on your cardiac medications before your next appointment, please call your pharmacy.      Signed, Armanda Magicraci Turner, MD  05/27/2016 9:49 AM    [facility] [address], [place], [place]  [address] Phone: [phone]; Fax: [phone]

## 2016-06-01 ENCOUNTER — Telehealth: Payer: Self-pay

## 2016-06-01 NOTE — Telephone Encounter (Signed)
Advised pt that Dr. Sharee Holsterpalski has received his derm records and reviewed them.  She stated that doxycycline was given as "temporary medicine" and that she does not recommend refilling this medication.  Isaiah [name]. Tekeya Geffert, CMA

## 2016-06-17 ENCOUNTER — Other Ambulatory Visit: Payer: BLUE CROSS/BLUE SHIELD | Admitting: *Deleted

## 2016-06-17 DIAGNOSIS — E782 Mixed hyperlipidemia: Secondary | ICD-10-CM

## 2016-06-17 DIAGNOSIS — I10 Essential (primary) hypertension: Secondary | ICD-10-CM | POA: Diagnosis not present

## 2016-06-17 DIAGNOSIS — I480 Paroxysmal atrial fibrillation: Secondary | ICD-10-CM

## 2016-06-17 LAB — COMPREHENSIVE METABOLIC PANEL
ALBUMIN: 4.3 g/dL (ref 3.5–5.5)
ALT: 25 IU/L (ref 0–44)
AST: 29 IU/L (ref 0–40)
Albumin/Globulin Ratio: 1.7 (ref 1.2–2.2)
Alkaline Phosphatase: 69 IU/L (ref 39–117)
BUN / CREAT RATIO: 16 (ref 9–20)
BUN: 23 mg/dL (ref 6–24)
Bilirubin Total: 0.9 mg/dL (ref 0.0–1.2)
CALCIUM: 9.2 mg/dL (ref 8.7–10.2)
CO2: 22 mmol/L (ref 18–29)
CREATININE: 1.47 mg/dL — AB (ref 0.76–1.27)
Chloride: 103 mmol/L (ref 96–106)
GFR, EST AFRICAN AMERICAN: 62 mL/min/{1.73_m2} (ref >59)
GFR, EST NON AFRICAN AMERICAN: 53 mL/min/{1.73_m2} — AB (ref >59)
GLUCOSE: 89 mg/dL (ref 65–99)
Globulin, Total: 2.5 g/dL (ref 1.5–4.5)
Potassium: 4.1 mmol/L (ref 3.5–5.2)
Sodium: 140 mmol/L (ref 134–144)
TOTAL PROTEIN: 6.8 g/dL (ref 6.0–8.5)

## 2016-06-17 LAB — LIPID PANEL
CHOL/HDL RATIO: 4 ratio (ref 0.0–5.0)
Cholesterol, Total: 194 mg/dL (ref 100–199)
HDL: 49 mg/dL (ref >39)
LDL CALC: 128 mg/dL — AB (ref 0–99)
Triglycerides: 86 mg/dL (ref 0–149)
VLDL CHOLESTEROL CAL: 17 mg/dL (ref 5–40)

## 2016-06-21 ENCOUNTER — Ambulatory Visit (INDEPENDENT_AMBULATORY_CARE_PROVIDER_SITE_OTHER): Payer: BLUE CROSS/BLUE SHIELD | Admitting: Cardiovascular Disease

## 2016-06-21 ENCOUNTER — Encounter: Payer: Self-pay | Admitting: Cardiovascular Disease

## 2016-06-21 VITALS — BP 122/80 | HR 67 | Ht 75.0 in | Wt 252.5 lb

## 2016-06-21 DIAGNOSIS — E782 Mixed hyperlipidemia: Secondary | ICD-10-CM | POA: Diagnosis not present

## 2016-06-21 DIAGNOSIS — I480 Paroxysmal atrial fibrillation: Secondary | ICD-10-CM

## 2016-06-21 DIAGNOSIS — I10 Essential (primary) hypertension: Secondary | ICD-10-CM

## 2016-06-21 NOTE — Progress Notes (Signed)
CARDIOLOGY OFFICE NOTE  Date:  06/21/2016    Isaiah [name], [name] Date of Birth: [birth year] Medical Record #[id number]  PCP:  Isaiah Lot, DO  Cardiologist:  Former patient of Isaiah [name].   Now Isaiah [name]  Chief Complaint  Patient presents with  . Hypertension   Problem list 1. History of recurrent pulmonary embolus 2. Hypercoagulability 3. Hypertension 4. Osteoarthritis-status post hip replacements 5.  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 Former patient of Isaiah [name].   He has a history of prior PE and found to have a hypercoagulability state with abnormal lupus anticoagulant - he is committed to lifelong anticoagulation and is on Xarelto. He has had frequent DVT, PAF, hypothyroidism, HLD and HTN.  He has sleep apnea and is on CPAP - followed by Dr. Mayford [name].   In January 2014 the patient experienced some chest tightness and underwent a treadmill Myoview stress test which showed no evidence of ischemia and his ejection fraction was 53% with no wall motion abnormalities.   Last seen in November. Cardiac status was stable.   Comes back today. Here alone. He is doing well. He had his hip replacement back in November - did well. Back on track with losing weight - says he is down 20 pounds. BP is lower. No chest pain. Breathing is good. Rhythm is good. Asking about reduction of medicines. His significant other has been diagnosed with breast cancer and he has been helping with her.    June 19, 2015:  Isaiah [name] is seen today for initial visit.   He is a former patient of Isaiah [name]. Has hx of PAF - none in the past 6-8 years.    Seems to be triggered by large doses of caffiene.  Has cardioverted on 1 occasion but usually he will convert back into NSR after a day or so .   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   No weight lifting due to a  recent hernia repair .   Dec. 1, 2017:  Isaiah [name] is seen today in follow-up for his paroxysmal atrial fib ablation, diastolic dysfunction, and hypertension. No palps  June 21, 2016: Still very active.  Used to lift heavy weight and fight MMA . Does cardio regularly. Does interval  , ellipitical  Dental practice is going well.    No episodes of atrial fib - none in the past 6-8 years  Has hx of DVT ,  He walks frequently when flying    Past Medical History:  Diagnosis Date  . Blood dyscrasia    lupus anticoagulant  . Diastolic dysfunction    Grade I per echo in 2/12  . DVT (deep venous thrombosis) (HCC) 2000's   "both legs in the calves; left went all the way up to my groin" (09/17/2012)  . Dysrhythmia   . GERD (gastroesophageal reflux disease)   . History of bronchitis   . Hypercoagulation syndrome (HCC)    secondary to circulating lupus anticoagulant  . Hyperlipidemia   . Hypertension   . Hypothyroidism    "wiped out from taking amiodarone" (09/17/2012)  . Lyme disease    history of   . Obesity (BMI 30-39.9) 07/17/2014  . PAF (paroxysmal atrial fibrillation) (HCC)    no recurrence since Feb 2012  . PONV (postoperative nausea and vomiting)    likes scopolomanine  patch  . Pulmonary embolism (HCC) 09/17/2012   "just today; 3 on the right; 2 on the left" (09/17/2012)  . Situational stress   . Sleep apnea    uses CPAP    Past Surgical History:  Procedure Laterality Date  . CARDIOVASCULAR STRESS TEST  07/17/2009   EF 59%  . CARDIOVERSION    . DISTAL BICEPS TENDON REPAIR Right 2011  . INGUINAL HERNIA REPAIR Right 1992  . INGUINAL HERNIA REPAIR Left 2013  . INSERTION OF MESH N/A 05/14/2015   Procedure: INSERTION OF MESH;  Surgeon: Avel Peaceodd Rosenbower, MD;  Location: [facility];  Service: General;  Laterality: N/A;  . SHOULDER ARTHROSCOPY W/ ROTATOR CUFF REPAIR Left 09/04/2012  . SHOULDER SURGERY     left  . TOTAL HIP ARTHROPLASTY Right 11/26/2014   Procedure: RIGHT TOTAL HIP  ARTHROPLASTY ANTERIOR APPROACH;  Surgeon: Ollen GrossFrank Aluisio, MD;  Location: [facility];  Service: Orthopedics;  Laterality: Right;  . TRANSTHORACIC ECHOCARDIOGRAM  03/05/2010   EF 60-65%  . UMBILICAL HERNIA REPAIR N/A 05/14/2015   Procedure: HERNIA REPAIR UMBILICAL ADULT WITH MESH ;  Surgeon: Avel Peaceodd Rosenbower, MD;  Location: [facility];  Service: General;  Laterality: N/A;  . WISDOM TOOTH EXTRACTION     lower 2     Medications: Current Outpatient Prescriptions  Medication Sig Dispense Refill  . [START ON 08/15/2016] ALPRAZolam (XANAX) 0.25 MG tablet Take 1 tablet (0.25 mg total) by mouth 2 (two) times daily as needed. 60 tablet 0  . amLODipine (NORVASC) 5 MG tablet Take 1 tablet (5 mg total) by mouth daily. 90 tablet 3  . doxycycline (VIBRAMYCIN) 100 MG capsule Take 100 mg by mouth daily.  0  . ezetimibe (ZETIA) 10 MG tablet Take 1 tablet (10 mg total) by mouth daily. 90 tablet 3  . furosemide (LASIX) 20 MG tablet Take 1 tablet (20 mg total) by mouth daily as needed for fluid. 90 tablet 3  . ibuprofen (ADVIL,MOTRIN) 200 MG tablet Take 200 mg by mouth every 6 (six) hours as needed.    Marland Kitchen. ketoconazole (NIZORAL) 2 % shampoo Applied 2-3 times weekly for several weeks until in remission. Once controlled, use weekly 120 mL 3  . lisinopril (PRINIVIL,ZESTRIL) 20 MG tablet Take 1 tablet (20 mg total) by mouth 2 (two) times daily. 180 tablet 3  . metoprolol succinate (TOPROL-XL) 25 MG 24 hr tablet Take 1 tablet (25 mg total) by mouth every morning. 90 tablet 2  . omega-3 acid ethyl esters (LOVAZA) 1 g capsule Take 1 capsule (1 g total) by mouth 2 (two) times daily. 180 capsule 3  . omeprazole (PRILOSEC) 20 MG capsule Take 1 capsule (20 mg total) by mouth daily. 30 capsule 0  . propafenone (RYTHMOL SR) 325 MG 12 hr capsule Take 1 capsule (325 mg total) by mouth 2 (two) times daily. 180 capsule 3  . SYNTHROID 150 MCG tablet Take 300 mcg by mouth daily.   0  . XARELTO 20 MG TABS tablet Take 1 tablet (20 mg total) by  mouth daily. 90 tablet 3   No current facility-administered medications for this visit.     Allergies: Allergies  Allergen Reactions  . Crestor [Rosuvastatin Calcium] Other (See Comments)    hepatitis  . Lipitor [Atorvastatin Calcium] Other (See Comments)    Feels like someone hit him with 2x4    Social History: The patient  reports that he has never smoked. He has never used smokeless tobacco. He reports that he drinks about 4.8 oz of  alcohol per week . He reports that he does not use drugs.   Family History: The patient's family history includes Cancer in his brother and mother; Heart attack in his maternal grandfather; Hyperlipidemia in his mother; Hypertension in his brother, father, and mother.   Review of Systems: Please see the history of present illness.   Otherwise, the review of systems is positive for none.   All other systems are reviewed and negative.   Physical Exam: VS:  BP 122/80   Pulse 67   Ht 6\' 3"  (1.905 m)   Wt 252 lb 8 oz (114.5 kg)   SpO2 97%   BMI 31.56 kg/m  .  BMI Body mass index is 31.56 kg/m.  Wt Readings from Last 3 Encounters:  06/21/16 252 lb 8 oz (114.5 kg)  05/27/16 259 lb (117.5 kg)  05/16/16 255 lb 0.6 oz (115.7 kg)    General: Pleasant. Well developed, well nourished and in no acute distress.    HEENT: Normal.  Neck: Supple, no JVD, carotid bruits, or masses noted.  Cardiac: Regular rate and rhythm. His heart tones are distant. No edema.  Respiratory:  Lungs are clear to auscultation bilaterally with normal work of breathing.  GI: Soft and nontender.  MS: No deformity or atrophy. Gait and ROM intact.  Skin: Warm and dry. Color is normal.  Neuro:  Strength and sensation are intact and no gross focal deficits noted.  Psych: Alert, appropriate and with normal affect.   LABORATORY DATA:  EKG:  EKG is not ordered today.  Lab Results  Component Value Date   WBC 4.4 09/10/2015   HGB 14.3 09/10/2015   HCT 46.2 09/10/2015   PLT  245 09/10/2015   GLUCOSE 89 06/17/2016   CHOL 194 06/17/2016   TRIG 86 06/17/2016   HDL 49 06/17/2016   LDLCALC 128 (H) 06/17/2016   ALT 25 06/17/2016   AST 29 06/17/2016   NA 140 06/17/2016   K 4.1 06/17/2016   CL 103 06/17/2016   CREATININE 1.47 (H) 06/17/2016   BUN 23 06/17/2016   CO2 22 06/17/2016   TSH 1.54 09/10/2015   INR 1.17 05/13/2015   HGBA1C 5.4 09/10/2015    BNP (last 3 results) No results for input(s): BNP in the last 8760 hours.  ProBNP (last 3 results) No results for input(s): PROBNP in the last 8760 hours.   Other Studies Reviewed Today:  Echo Study Conclusions from 2014  Left ventricle: The cavity size was normal. Wall thickness was increased in a pattern of mild LVH. Systolic function was normal. The estimated ejection fraction was in the range of 55% to 60%. There was an increased relative contribution of atrial contraction to ventricular filling.   Myoview Impression from 2014 Exercise Capacity: Good exercise capacity. BP Response: Normal blood pressure response. Clinical Symptoms: Mild dyspnea ECG Impression: No significant ST segment change suggestive of ischemia. Comparison with Prior Nuclear Study: No significant change from previous study  Overall Impression: Low risk stress nuclear study. No evidence for ischemia or infarction. EF mildly decreased.   LV Ejection Fraction: 53%. LV Wall Motion: Mild global hypokinesis.   Marca Ancona 02/06/2012  ECG:   NSR at 66.  Normal ECG   Assessment/Plan: 1. Hypercoagulable syndrome :  Has Lupus anticoagulant . Past history of pulmonary emboli and past history of DVTs.  Continue xarelto   2. Paroxysmal atrial fibrillation, currently maintaining normal sinus rhythm. Continue rythmol   3. Hypercholesterolemia - followed by primary care.  4. Essential hypertension - BP much lower continue with weight loss efforts.  5. Hypothyroidism followed by Dr. Evlyn Kanner  6. Osteoarthritis  of right hip. Now s/p THR. Doing well.   Current medicines are reviewed with the patient today.  The patient does not have concerns regarding medicines other than what has been noted above.    Patient is agreeable to this plan and will call if any problems develop in the interim.   Will see him in 6 months .   Kristeen Miss, MD  06/21/2016 4:39 PM    [facility] [address] [place],  [address] [place], [place]  [address] Pager [phone] Phone: [phone]; Fax: [phone]

## 2016-06-21 NOTE — Patient Instructions (Signed)

## 2016-06-22 ENCOUNTER — Other Ambulatory Visit: Payer: Self-pay | Admitting: Cardiovascular Disease

## 2016-06-29 ENCOUNTER — Other Ambulatory Visit: Payer: Self-pay | Admitting: Cardiovascular Disease

## 2016-07-07 DIAGNOSIS — I2699 Other pulmonary embolism without acute cor pulmonale: Secondary | ICD-10-CM | POA: Diagnosis not present

## 2016-07-07 DIAGNOSIS — D6859 Other primary thrombophilia: Secondary | ICD-10-CM | POA: Diagnosis not present

## 2016-07-07 DIAGNOSIS — Z1389 Encounter for screening for other disorder: Secondary | ICD-10-CM | POA: Diagnosis not present

## 2016-07-07 DIAGNOSIS — E784 Other hyperlipidemia: Secondary | ICD-10-CM | POA: Diagnosis not present

## 2016-07-07 DIAGNOSIS — E038 Other specified hypothyroidism: Secondary | ICD-10-CM | POA: Diagnosis not present

## 2016-07-28 ENCOUNTER — Telehealth: Payer: Self-pay | Admitting: Cardiovascular Disease

## 2016-07-28 NOTE — Telephone Encounter (Signed)
Left message for patient to call back  

## 2016-07-28 NOTE — Telephone Encounter (Signed)
Pt c/o Shortness Of Breath: STAT if SOB developed within the last 24 hours or pt is noticeably SOB on the phone  1. Are you currently SOB (can you hear that pt is SOB on the phone)? yes 2. How long have you been experiencing SOB? Tuesday    3. Are you SOB when sitting or when up moving around? both 4. Are you currently experiencing any other symptoms? Chest pain

## 2016-07-28 NOTE — Telephone Encounter (Signed)
Patient returning call. Patient c/o of chest pain and SOB. Patient is wanting to come in to see someone today or tomorrow. There are no appointments today or tomorrow. Encouraged patient to go to the ED for evaluation. Patient is refusing to go to ED. Will forward to Dr. Elease HashimotoNahser for further advisement.

## 2016-07-29 ENCOUNTER — Ambulatory Visit (INDEPENDENT_AMBULATORY_CARE_PROVIDER_SITE_OTHER): Payer: BLUE CROSS/BLUE SHIELD | Admitting: Family Medicine

## 2016-07-29 ENCOUNTER — Ambulatory Visit: Payer: BLUE CROSS/BLUE SHIELD

## 2016-07-29 ENCOUNTER — Encounter: Payer: Self-pay | Admitting: Family Medicine

## 2016-07-29 VITALS — BP 120/78 | HR 70 | Ht 75.0 in | Wt 254.0 lb

## 2016-07-29 DIAGNOSIS — R0602 Shortness of breath: Secondary | ICD-10-CM

## 2016-07-29 DIAGNOSIS — R0789 Other chest pain: Secondary | ICD-10-CM

## 2016-07-29 MED ORDER — PREDNISONE 5 MG (21) PO TBPK: ORAL_TABLET | ORAL | 0 refills | Status: DC

## 2016-07-29 MED ORDER — AZITHROMYCIN 250 MG PO TABS: ORAL_TABLET | ORAL | 0 refills | Status: DC

## 2016-07-29 NOTE — Progress Notes (Signed)
Impression and Recommendations:    1. Chest tightness   2. SOB (shortness of breath)    EKG reassuring  Trial on meds- Zpak and steroids after d/c pt and this is his preference.  Please let me know if not feeling better by mid week next week.  We can consider a CT scan of  chest and have you follow-up with cardiology if not improved.  For the stress please consider coming back and talking to me again. Declined meds today  The patient was counseled, risk factors were discussed, anticipatory guidance given.   New Medications this visit:   AZITHROMYCIN (ZITHROMAX Z-PAK) 250 MG TABLET    Take 2 tablets (500 mg) on  Day 1,  followed by 1 tablet (250 mg) once daily on Days 2 through 5.   KETOCONAZOLE (NIZORAL) 2 % SHAMPOO    Applied 2-3 times weekly for several weeks until in remission. Once controlled, use weekly   PREDNISONE (STERAPRED UNI-PAK 21 TAB) 5 MG (21) TBPK TABLET    Take as prescribed      Orders Placed This Encounter  Procedures  . DG Chest 2 View  . EKG 12-Lead     Gross side effects, risk and benefits, and alternatives of medications and treatment plan in general discussed with patient.  Patient is aware that all medications have potential side effects and we are unable to predict every side effect or drug-drug interaction that may occur.   Patient will call with any questions prior to using medication if they have concerns.  Expresses verbal understanding and consents to current therapy and treatment regimen.  No barriers to understanding were identified.  Red flag symptoms and signs discussed in detail.  Patient expressed understanding regarding what to do in case of emergency\urgent symptoms  Please see AVS handed out to patient at the end of our visit for further patient instructions/ counseling done pertaining to today's office visit.   Return if symptoms worsen or fail to improve.     Note: This document was prepared using Dragon voice recognition  software and may include unintentional dictation errors.  [name] 10:26 PM --------------------------------------------------------------------------------------------------------------------------------------------------------------------------------------------------------------------------------------------    Subjective:    CC:  Chief Complaint  Patient presents with  . Shortness of Breath  . 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  Noticed some SOB while on vacation while using his CPAP machine.  He is worried that he may be something from that type of infection.  He also had some stress due to work and was called while on vacation which caused additional symptoms of some chest tightness and retrosternal pain the size of a fist right behind his sternum on the right side.  This is made worse when he takes deep breaths.  It is not changed with activity or rest.  It comes and goes.  It is not made worse with drinking or eating anything or laying down versus standing.  He cannot relate anything that makes it worse or better.  He's had some mild congestion but nothing abnormal for this time of the year.  He has a cough occasionally, which is nonproductive but nothing really new.   No problems updated.   Wt Readings from Last 3 Encounters:  08/04/16 245 lb 6.4 oz (111.3 kg)  07/29/16 254 lb (115.2 kg)  06/21/16 252 lb 8  oz (114.5 kg)   BP Readings from Last 3 Encounters:  08/04/16 108/80  07/29/16 120/78  06/21/16 122/80   Pulse Readings from Last 3 Encounters:  08/04/16 61  07/29/16 70  06/21/16 67   BMI Readings from Last 3 Encounters:  08/04/16 30.67 kg/m  07/29/16 31.75 kg/m  06/21/16 31.56 kg/m     Patient Care Team    Relationship Specialty Notifications Start End  [name], DO PCP - General Family Medicine  08/26/15   Arminda Resides, MD  Consulting Physician Dermatology  10/01/15   Quintella Reichert, MD Consulting Physician Cardiology  10/01/15    Comment: Sleep Med  [name], Deloris Ping, MD Consulting Physician Cardiology  10/01/15   Adrian Prince, MD Consulting Physician Endocrinology  05/16/16   Jethro Bolus, MD Consulting Physician Urology  05/16/16      Patient Active Problem List   Diagnosis Date Noted  . Chronic anticoagulation- abnormal lupus anticoagulant 08/27/2015    Priority: High  . Other reactions to severe stress 08/27/2015    Priority: High  . Dyslipidemia 05/10/2013    Priority: High  . HTN (hypertension) 10/01/2010    Priority: High  . h/o PE (pulmonary embolism) 09/17/2012    Priority: Medium  . Personal history of multiple DVTs (deep vein thrombosis) 08/27/2015    Priority: Low  . Hypothyroidism 12/17/2010    Priority: Low  . Hypercoagulopathy (HCC) 08/05/2016  . Seborrheic dermatitis of scalp 10/01/2015  . Osteoarthritis of multiple joints 08/27/2015  . CPAP (continuous positive airway pressure) dependence 08/27/2015  . GERD (gastroesophageal reflux disease) 08/27/2015  . h/o Lyme disease- 2014 tick bite 08/27/2015  . Memory changes- since Lyme Dx 08/27/2015  . Obesity (BMI 30-39.9) 07/17/2014  . OSA (obstructive sleep apnea) 05/02/2014  . History of chronic Myalgias since Lyme Dx 2014 05/10/2013  . Chest pain 01/27/2012  . PAF (paroxysmal atrial fibrillation) (HCC) 10/01/2010  . High risk medication use 10/01/2010    Past Medical history, Surgical history, Family history, Social history, Allergies and Medications have been entered into the medical record, reviewed and changed as needed.    Current Meds  Medication Sig  . [START ON 08/15/2016] ALPRAZolam (XANAX) 0.25 MG tablet Take 1 tablet (0.25 mg total) by mouth 2 (two) times daily as needed.  Marland Kitchen amLODipine (NORVASC) 5 MG tablet Take 1 tablet (5 mg total) by mouth daily.  Marland Kitchen doxycycline (VIBRAMYCIN) 100 MG capsule Take 100 mg by mouth  daily as needed. As needed for spots on head.  . ezetimibe (ZETIA) 10 MG tablet Take 1 tablet (10 mg total) by mouth daily.  . furosemide (LASIX) 20 MG tablet Take 1 tablet (20 mg total) by mouth daily as needed for fluid.  Marland Kitchen ibuprofen (ADVIL,MOTRIN) 200 MG tablet Take 200 mg by mouth every 6 (six) hours as needed.  Marland Kitchen lisinopril (PRINIVIL,ZESTRIL) 20 MG tablet Take 1 tablet (20 mg total) by mouth 2 (two) times daily.  . metoprolol succinate (TOPROL-XL) 25 MG 24 hr tablet TAKE 1 TABLET BY MOUTH EACH MORNING  . omega-3 acid ethyl esters (LOVAZA) 1 g capsule Take 1 capsule (1 g total) by mouth 2 (two) times daily.  Marland Kitchen omeprazole (PRILOSEC) 20 MG capsule Take 1 capsule (20 mg total) by mouth daily.  . propafenone (RYTHMOL SR) 325 MG 12 hr capsule TAKE 1 CAPSULE BY MOUTH TWICE DAILY  . SYNTHROID 150 MCG tablet Take 300 mcg by mouth daily.   Carlena Hurl 20 MG TABS tablet Take 1 tablet (20 mg  total) by mouth daily.  . [DISCONTINUED] ketoconazole (NIZORAL) 2 % shampoo Applied 2-3 times weekly for several weeks until in remission. Once controlled, use weekly    Allergies:  Allergies  Allergen Reactions  . Crestor [Rosuvastatin Calcium] Other (See Comments)    hepatitis  . Lipitor [Atorvastatin Calcium] Other (See Comments)    Feels like someone hit him with 2x4     Review of Systems: General:   Denies fever, chills, unexplained weight loss.  Optho/Auditory:   Denies visual changes, blurred vision/LOV Respiratory:   Denies wheeze, DOE more than baseline levels.  Cardiovascular:   Denies chest pain, palpitations, new onset peripheral edema  Gastrointestinal:   Denies nausea, vomiting, diarrhea, abd pain.  Genitourinary: Denies dysuria, freq/ urgency, flank pain or discharge from genitals.  Endocrine:     Denies hot or cold intolerance, polyuria, polydipsia. Musculoskeletal:   Denies unexplained myalgias, joint swelling, unexplained arthralgias, gait problems.  Skin:  Denies new onset rash,  suspicious lesions Neurological:     Denies dizziness, unexplained weakness, numbness  Psychiatric/Behavioral:   Denies mood changes, suicidal or homicidal ideations, hallucinations    Objective:   Blood pressure 120/78, pulse 70, height 6\' 3"  (1.905 m), weight 254 lb (115.2 kg), SpO2 97 %. Body mass index is 31.75 kg/m. General:  Well Developed, well nourished, appropriate for stated age.  Neuro:  Alert and oriented,  extra-ocular muscles intact  HEENT:  Normocephalic, atraumatic, neck supple, no carotid bruits appreciated  Skin:  no gross rash, warm, pink. Cardiac:  RRR, S1 S2, no TTP, no crepitus, no M spasms Respiratory:  ECTA B/L and A/P, Not using accessory muscles, speaking in full sentences- unlabored. Vascular:  Ext warm, no cyanosis apprec.; cap RF less 2 sec. Psych:  No HI/SI, judgement and insight good, Euthymic mood. Full Affect.

## 2016-07-29 NOTE — Patient Instructions (Addendum)
Please let me know if you're not feeling better by mid week next week.  We can consider a CT scan of  chest and heavy follow-up with her cardiology if not improved.  For the stress please consider coming back and talking to me again

## 2016-08-01 NOTE — Telephone Encounter (Signed)
I have been out of the office due to a death in the family. Please call and see how Isaiah [name] is doing .  I can see him at 7:40 AM tomorrow ( Tuesday ).  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

## 2016-08-01 NOTE — Telephone Encounter (Signed)
Spoke with pt stated had symptoms addressed did not need appt ./cy

## 2016-08-04 ENCOUNTER — Ambulatory Visit (INDEPENDENT_AMBULATORY_CARE_PROVIDER_SITE_OTHER): Payer: BLUE CROSS/BLUE SHIELD | Admitting: Cardiovascular Disease

## 2016-08-04 ENCOUNTER — Encounter: Payer: Self-pay | Admitting: Cardiovascular Disease

## 2016-08-04 ENCOUNTER — Other Ambulatory Visit: Payer: Self-pay | Admitting: Nurse Practitioner

## 2016-08-04 VITALS — BP 108/80 | HR 61 | Ht 75.0 in | Wt 245.4 lb

## 2016-08-04 DIAGNOSIS — R079 Chest pain, unspecified: Secondary | ICD-10-CM | POA: Diagnosis not present

## 2016-08-04 DIAGNOSIS — D6859 Other primary thrombophilia: Secondary | ICD-10-CM | POA: Diagnosis not present

## 2016-08-04 LAB — TROPONIN T

## 2016-08-04 LAB — D-DIMER, QUANTITATIVE: D-DIMER: <0.2 mg/L FEU (ref 0.00–0.49)

## 2016-08-04 NOTE — Patient Instructions (Addendum)
Medication Instructions:  Your physician recommends that you continue on your current medications as directed. Please refer to the Current Medication list given to you today.   Labwork: TODAY - Ddimer, Troponin, BMET   Testing/Procedures: Non-Cardiac CT Angiography (CTA), is a special type of CT scan that uses a computer to produce multi-dimensional views of major blood vessels throughout the body. In CT angiography, a contrast material is injected through an IV to help visualize the blood vessels  Your physician has requested that you have an echocardiogram. Echocardiography is a painless test that uses sound waves to create images of your heart. It provides your doctor with information about the size and shape of your heart and how well your heart's chambers and valves are working. This procedure takes approximately one hour. There are no restrictions for this procedure.   Follow-Up: Your physician recommends that you schedule a follow-up appointment in: 6-8 weeks with Dr. Elease HashimotoNahser   If you need a refill on your cardiac medications before your next appointment, please call your pharmacy.   Thank you for choosing [facility]! Eligha BridegroomMichelle Clint Biello, RN [phone]

## 2016-08-04 NOTE — Progress Notes (Signed)
CARDIOLOGY OFFICE NOTE  Date:  08/04/2016    Isaiah [name], Isaiah [name] Date of Birth: 08/05/1960 Medical Record #[id number]  PCP:  Isaiah Lot, DO  Cardiologist:  Former patient of Isaiah [name].   Now Isaiah [name]  Chief Complaint  Patient presents with  . Chest Pain   Problem list 1. History of recurrent pulmonary embolus 2. Hypercoagulability - Lupus anticoagulant  3. Hypertension 4. Osteoarthritis-status post hip replacements 5.  Paroxysmal atrial fibrillation 6. Lyme disease   Isaiah [name], Isaiah [name] is a 56 y.o. male who presents today for a 4 month check. Former patient of Isaiah [name].   He has a history of prior PE and found to have a hypercoagulability state with abnormal lupus anticoagulant - he is committed to lifelong anticoagulation and is on Xarelto. He has had frequent DVT, PAF, hypothyroidism, HLD and HTN.  He has sleep apnea and is on CPAP - followed by Dr. Mayford [name].   In January 2014 the patient experienced some chest tightness and underwent a treadmill Myoview stress test which showed no evidence of ischemia and his ejection fraction was 53% with no wall motion abnormalities.   Last seen in November. Cardiac status was stable.   Comes back today. Here alone. He is doing well. He had his hip replacement back in November - did well. Back on track with losing weight - says he is down 20 pounds. BP is lower. No chest pain. Breathing is good. Rhythm is good. Asking about reduction of medicines. His significant other has been diagnosed with breast cancer and he has been helping with her.    June 19, 2015:  Isaiah [name] is seen today for initial visit.   He is a former patient of Isaiah [name]. Has hx of PAF - none in the past 6-8 years.    Seems to be triggered by large doses of caffiene.  Has cardioverted on 1 occasion but usually he will convert back into NSR after a day or so .   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   No weight lifting due to a recent hernia repair .   Dec. 1, 2017:  Isaiah [name] is seen today in follow-up for his paroxysmal atrial fib ablation, diastolic dysfunction, and hypertension. No palps  June 21, 2016: Still very active.  Used to lift heavy weight and fight MMA . Does cardio regularly. Does interval  , ellipitical  Dental practice is going well.    No episodes of atrial fib - none in the past 6-8 years  Has hx of DVT ,  He walks frequently when flying   August 04, 2016:  Isaiah [name] is seen today for work in visit. He started having episodes of chest pain last week.  He went on vacation a week ago Ate too much salt, Came home ,  Took Lasix for swelling  Had a non productive cough.   Chest tightness.   Pressure like sensation No pleuretic CP,  Difficult to take a deep breath No fever , dry cough.  Felt a bit better after Z-pac.  But chest pressure persisted.  Feels different from his pulmonary embolus pain   Echo in 2014 showed normal LV function    Past Medical History:  Diagnosis Date  . Blood dyscrasia    lupus anticoagulant  . Diastolic dysfunction    Grade I per echo in 2/12  . DVT (deep venous thrombosis) (HCC) 2000's   "  both legs in the calves; left went all the way up to my groin" (09/17/2012)  . Dysrhythmia   . GERD (gastroesophageal reflux disease)   . History of bronchitis   . Hypercoagulation syndrome (HCC)    secondary to circulating lupus anticoagulant  . Hyperlipidemia   . Hypertension   . Hypothyroidism    "wiped out from taking amiodarone" (09/17/2012)  . Lyme disease    history of   . Obesity (BMI 30-39.9) 07/17/2014  . PAF (paroxysmal atrial fibrillation) (HCC)    no recurrence since Feb 2012  . PONV (postoperative nausea and vomiting)    likes scopolomanine patch  . Pulmonary embolism (HCC) 09/17/2012   "just today; 3 on the right; 2 on the left" (09/17/2012)  . Situational stress   . Sleep apnea    uses CPAP    Past Surgical History:    Procedure Laterality Date  . CARDIOVASCULAR STRESS TEST  07/17/2009   EF 59%  . CARDIOVERSION    . DISTAL BICEPS TENDON REPAIR Right 2011  . INGUINAL HERNIA REPAIR Right 1992  . INGUINAL HERNIA REPAIR Left 2013  . INSERTION OF MESH N/A 05/14/2015   Procedure: INSERTION OF MESH;  Surgeon: Avel Peace, MD;  Location: [facility];  Service: General;  Laterality: N/A;  . SHOULDER ARTHROSCOPY W/ ROTATOR CUFF REPAIR Left 09/04/2012  . SHOULDER SURGERY     left  . TOTAL HIP ARTHROPLASTY Right 11/26/2014   Procedure: RIGHT TOTAL HIP ARTHROPLASTY ANTERIOR APPROACH;  Surgeon: Ollen Gross, MD;  Location: [facility];  Service: Orthopedics;  Laterality: Right;  . TRANSTHORACIC ECHOCARDIOGRAM  03/05/2010   EF 60-65%  . UMBILICAL HERNIA REPAIR N/A 05/14/2015   Procedure: HERNIA REPAIR UMBILICAL ADULT WITH MESH ;  Surgeon: Avel Peace, MD;  Location: [facility];  Service: General;  Laterality: N/A;  . WISDOM TOOTH EXTRACTION     lower 2     Medications: Current Outpatient Prescriptions  Medication Sig Dispense Refill  . [START ON 08/15/2016] ALPRAZolam (XANAX) 0.25 MG tablet Take 1 tablet (0.25 mg total) by mouth 2 (two) times daily as needed. 60 tablet 0  . amLODipine (NORVASC) 5 MG tablet Take 1 tablet (5 mg total) by mouth daily. 90 tablet 3  . doxycycline (VIBRAMYCIN) 100 MG capsule Take 100 mg by mouth daily as needed. As needed for spots on head.  0  . ezetimibe (ZETIA) 10 MG tablet Take 1 tablet (10 mg total) by mouth daily. 90 tablet 3  . furosemide (LASIX) 20 MG tablet Take 1 tablet (20 mg total) by mouth daily as needed for fluid. 90 tablet 3  . ibuprofen (ADVIL,MOTRIN) 200 MG tablet Take 200 mg by mouth every 6 (six) hours as needed.    Marland Kitchen lisinopril (PRINIVIL,ZESTRIL) 20 MG tablet Take 1 tablet (20 mg total) by mouth 2 (two) times daily. 180 tablet 3  . metoprolol succinate (TOPROL-XL) 25 MG 24 hr tablet TAKE 1 TABLET BY MOUTH EACH MORNING 90 tablet 3  . omega-3 acid ethyl esters (LOVAZA) 1 g  capsule Take 1 capsule (1 g total) by mouth 2 (two) times daily. 180 capsule 3  . omeprazole (PRILOSEC) 20 MG capsule Take 1 capsule (20 mg total) by mouth daily. 30 capsule 0  . propafenone (RYTHMOL SR) 325 MG 12 hr capsule TAKE 1 CAPSULE BY MOUTH TWICE DAILY 180 capsule 3  . SYNTHROID 150 MCG tablet Take 300 mcg by mouth daily.   0  . XARELTO 20 MG TABS tablet Take 1 tablet (20  mg total) by mouth daily. 90 tablet 3   No current facility-administered medications for this visit.     Allergies: Allergies  Allergen Reactions  . Crestor [Rosuvastatin Calcium] Other (See Comments)    hepatitis  . Lipitor [Atorvastatin Calcium] Other (See Comments)    Feels like someone hit him with 2x4    Social History: The patient  reports that he has never smoked. He has never used smokeless tobacco. He reports that he drinks about 4.8 oz of alcohol per week . He reports that he does not use drugs.   Family History: The patient's family history includes Cancer in his brother and mother; Heart attack in his maternal grandfather; Hyperlipidemia in his mother; Hypertension in his brother, father, and mother.   Review of Systems: Please see the history of present illness.   Otherwise, the review of systems is positive for none.   All other systems are reviewed and negative.   Physical Exam: VS:  BP 108/80 (BP Location: Left Arm, Patient Position: Sitting, Cuff Size: Large)   Pulse 61   Ht 6\' 3"  (1.905 m)   Wt 245 lb 6.4 oz (111.3 kg)   SpO2 95%   BMI 30.67 kg/m  .  BMI Body mass index is 30.67 kg/m.  Wt Readings from Last 3 Encounters:  08/04/16 245 lb 6.4 oz (111.3 kg)  07/29/16 254 lb (115.2 kg)  06/21/16 252 lb 8 oz (114.5 kg)    General: Pleasant. Well developed, well nourished and in no acute distress.    HEENT: Normal.  Neck: Supple, no JVD, carotid bruits, or masses noted.  Cardiac: Regular rate and rhythm. His heart tones are distant. No edema.  Respiratory:  Lungs are clear to  auscultation bilaterally with normal work of breathing.  GI: Soft and nontender.  MS: No deformity or atrophy. Gait and ROM intact.  Skin: Warm and dry. Color is normal.  Neuro:  Strength and sensation are intact and no gross focal deficits noted.  Psych: Alert, appropriate and with normal affect.   LABORATORY DATA:  EKG:  EKG is not ordered today.  Lab Results  Component Value Date   WBC 4.4 09/10/2015   HGB 14.3 09/10/2015   HCT 46.2 09/10/2015   PLT 245 09/10/2015   GLUCOSE 89 06/17/2016   CHOL 194 06/17/2016   TRIG 86 06/17/2016   HDL 49 06/17/2016   LDLCALC 128 (H) 06/17/2016   ALT 25 06/17/2016   AST 29 06/17/2016   NA 140 06/17/2016   K 4.1 06/17/2016   CL 103 06/17/2016   CREATININE 1.47 (H) 06/17/2016   BUN 23 06/17/2016   CO2 22 06/17/2016   TSH 1.54 09/10/2015   INR 1.17 05/13/2015   HGBA1C 5.4 09/10/2015    BNP (last 3 results) No results for input(s): BNP in the last 8760 hours.  ProBNP (last 3 results) No results for input(s): PROBNP in the last 8760 hours.   Other Studies Reviewed Today:  Echo Study Conclusions from 2014  Left ventricle: The cavity size was normal. Wall thickness was increased in a pattern of mild LVH. Systolic function was normal. The estimated ejection fraction was in the range of 55% to 60%. There was an increased relative contribution of atrial contraction to ventricular filling.   Myoview Impression from 2014 Exercise Capacity: Good exercise capacity. BP Response: Normal blood pressure response. Clinical Symptoms: Mild dyspnea ECG Impression: No significant ST segment change suggestive of ischemia. Comparison with Prior Nuclear Study: No significant change from  previous study  Overall Impression: Low risk stress nuclear study. No evidence for ischemia or infarction. EF mildly decreased.   LV Ejection Fraction: 53%. LV Wall Motion: Mild global hypokinesis.   Marca Ancona 02/06/2012  ECG:   NSR at 61.   Normal ECG   Assessment/Plan:  -  Chest tightness:  Isaiah [name] presents with episodes of chest tightness.  He had some improvement with CPAP but he still continues to have chest tightness. His symptoms do not sound like a pulmonary in was and he has been taking his Xarelto consistently.  We'll get a d-dimer for further evaluation. I would also like to get a troponin level. We will schedule him for an echocardiogram and will also schedule him for a CT angiogram of his coronary arteries.  I'll see him in 6-8 weeks for follow-up visit.   2. Hypercoagulable syndrome :  Has Lupus anticoagulant . Past history of pulmonary emboli and past history of DVTs.  Continue xarelto   3. Paroxysmal atrial fibrillation, currently maintaining normal sinus rhythm. Continue rythmol   4. Hypercholesterolemia - followed by primary care.   5. Essential hypertension - BP much lower continue with weight loss efforts.  6. Hypothyroidism followed by Dr. Evlyn Kanner  7. Osteoarthritis of right hip. Now s/p THR. Doing well.   Current medicines are reviewed with the patient today.  The patient does not have concerns regarding medicines other than what has been noted above.    Patient is agreeable to this plan and will call if any problems develop in the interim.   Will see him in 6 months .   Kristeen Miss, MD  08/04/2016 1:17 PM    [facility] [address] [place],  [address] [place], [place]  [address] Pager [phone] Phone: [phone]; Fax: [phone]

## 2016-08-05 DIAGNOSIS — D6859 Other primary thrombophilia: Secondary | ICD-10-CM | POA: Insufficient documentation

## 2016-08-05 LAB — BASIC METABOLIC PANEL
BUN / CREAT RATIO: 17 (ref 9–20)
BUN: 26 mg/dL — AB (ref 6–24)
CO2: 22 mmol/L (ref 20–29)
CREATININE: 1.56 mg/dL — AB (ref 0.76–1.27)
Calcium: 9.9 mg/dL (ref 8.7–10.2)
Chloride: 102 mmol/L (ref 96–106)
GFR, EST AFRICAN AMERICAN: 57 mL/min/{1.73_m2} — AB (ref >59)
GFR, EST NON AFRICAN AMERICAN: 50 mL/min/{1.73_m2} — AB (ref >59)
Glucose: 96 mg/dL (ref 65–99)
Potassium: 4.9 mmol/L (ref 3.5–5.2)
SODIUM: 141 mmol/L (ref 134–144)

## 2016-08-12 ENCOUNTER — Other Ambulatory Visit: Payer: Self-pay

## 2016-08-12 ENCOUNTER — Ambulatory Visit (HOSPITAL_COMMUNITY): Payer: BLUE CROSS/BLUE SHIELD | Attending: Cardiology

## 2016-08-12 DIAGNOSIS — I480 Paroxysmal atrial fibrillation: Secondary | ICD-10-CM | POA: Diagnosis not present

## 2016-08-12 DIAGNOSIS — Z86718 Personal history of other venous thrombosis and embolism: Secondary | ICD-10-CM | POA: Insufficient documentation

## 2016-08-12 DIAGNOSIS — R079 Chest pain, unspecified: Secondary | ICD-10-CM

## 2016-08-12 DIAGNOSIS — E785 Hyperlipidemia, unspecified: Secondary | ICD-10-CM | POA: Insufficient documentation

## 2016-08-12 DIAGNOSIS — I10 Essential (primary) hypertension: Secondary | ICD-10-CM | POA: Diagnosis not present

## 2016-08-12 DIAGNOSIS — G4733 Obstructive sleep apnea (adult) (pediatric): Secondary | ICD-10-CM | POA: Diagnosis not present

## 2016-08-12 DIAGNOSIS — I340 Nonrheumatic mitral (valve) insufficiency: Secondary | ICD-10-CM | POA: Insufficient documentation

## 2016-08-12 DIAGNOSIS — Z86711 Personal history of pulmonary embolism: Secondary | ICD-10-CM | POA: Diagnosis not present

## 2016-08-16 ENCOUNTER — Encounter: Payer: Self-pay | Admitting: Cardiovascular Disease

## 2016-08-18 ENCOUNTER — Encounter (HOSPITAL_COMMUNITY): Payer: Self-pay

## 2016-08-18 ENCOUNTER — Ambulatory Visit (HOSPITAL_COMMUNITY): Admission: RE | Admit: 2016-08-18 | Payer: BLUE CROSS/BLUE SHIELD | Source: Ambulatory Visit

## 2016-08-18 ENCOUNTER — Ambulatory Visit (HOSPITAL_COMMUNITY)
Admission: RE | Admit: 2016-08-18 | Discharge: 2016-08-18 | Disposition: A | Discharge status: Home / Self Care | Payer: BLUE CROSS/BLUE SHIELD | Source: Ambulatory Visit | Attending: Cardiovascular Disease | Admitting: Cardiovascular Disease

## 2016-08-18 DIAGNOSIS — R079 Chest pain, unspecified: Secondary | ICD-10-CM | POA: Diagnosis not present

## 2016-08-18 MED ORDER — IOPAMIDOL (ISOVUE-370) INJECTION 76%
INTRAVENOUS | Status: AC
  Administered 2016-08-18: 80 mL
  Filled 2016-08-18: qty 100

## 2016-08-18 MED ORDER — NITROGLYCERIN 0.4 MG SL SUBL
0.8000 mg | SUBLINGUAL_TABLET | Freq: Once | SUBLINGUAL | Status: AC
  Administered 2016-08-18: 0.8 mg via SUBLINGUAL

## 2016-08-18 MED ORDER — NITROGLYCERIN 0.4 MG SL SUBL
SUBLINGUAL_TABLET | SUBLINGUAL | Status: AC
  Filled 2016-08-18: qty 1

## 2016-08-18 MED ORDER — METOPROLOL TARTRATE 5 MG/5ML IV SOLN
INTRAVENOUS | Status: AC
  Administered 2016-08-18: 5 mg via INTRAVENOUS
  Filled 2016-08-18: qty 5

## 2016-08-18 MED ORDER — METOPROLOL TARTRATE 5 MG/5ML IV SOLN
5.0000 mg | Freq: Once | INTRAVENOUS | Status: AC
  Administered 2016-08-18: 5 mg via INTRAVENOUS

## 2016-08-19 ENCOUNTER — Telehealth: Payer: Self-pay | Admitting: Cardiovascular Disease

## 2016-08-19 NOTE — Telephone Encounter (Signed)
Patient calling for CT results. 

## 2016-08-19 NOTE — Telephone Encounter (Signed)
Notes recorded by Vesta MixerNahser, Philip J, MD on 08/19/2016 at 1:44 PM EDT Minimal CAD Continue aggressive lipid lowering therapy.  Reviewed results of CT with pt who states understanding.  He does report he is continuing to have the same type chest tightness and is wondering what his next steps would be.  Reassurance given as D-dimer was normal and CT was with minimal CAD noted.  He reports he has not been in At Fib for some time and doesn't feel as though the discomfort is coming from a rapid HR.  Advised to continue current therapy and monitor for any increase in chest pain/tightness.  He would like Dr Elease HashimotoNahser to let him know if there is any other testing he needs at this time.  Pt will keep appt as scheduled in September.

## 2016-08-20 NOTE — Telephone Encounter (Signed)
No additional testing at this time.

## 2016-08-22 ENCOUNTER — Ambulatory Visit (HOSPITAL_COMMUNITY): Payer: BLUE CROSS/BLUE SHIELD

## 2016-08-22 ENCOUNTER — Encounter: Payer: Self-pay | Admitting: Cardiovascular Disease

## 2016-09-07 ENCOUNTER — Encounter: Payer: Self-pay | Admitting: Cardiovascular Disease

## 2016-09-08 ENCOUNTER — Telehealth: Payer: Self-pay | Admitting: Family Medicine

## 2016-09-08 ENCOUNTER — Other Ambulatory Visit: Payer: Self-pay

## 2016-09-08 DIAGNOSIS — T7029XS Other effects of high altitude, sequela: Secondary | ICD-10-CM

## 2016-09-08 MED ORDER — ACETAZOLAMIDE 125 MG PO TABS: 125.0000 mg | ORAL_TABLET | Freq: Two times a day (BID) | ORAL | 0 refills | Status: DC

## 2016-09-08 NOTE — Telephone Encounter (Signed)
Prescription for Acetazolamide- 125 mg by mouth twice a day.  Have him start the medicine 24 hours before the day of ascent.  Also can discontinue 2-3 days upon descent.     Please give him appropriate amount of medicine for [place] of [place]

## 2016-09-08 NOTE — Telephone Encounter (Signed)
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 Please advise, patient can be reached on cel at [phone]

## 2016-09-08 NOTE — Telephone Encounter (Signed)
Patient called and states that he is going on a trip to [place] for 2 weeks and will be at altitudes of 10k-14k feet and is requesting a RX for Acetazolamide.  Per Dr. Sharee Holster okay to send in RX for Acetazolamide 125mg  bid: start 24 hours before day of Ascent and to discontinue use 2-3 days upon descent.  MPulliam, CMA/RT(R)

## 2016-09-08 NOTE — Telephone Encounter (Signed)
Please advise Thanks. MPulliam, CMA/RT(R)  

## 2016-09-08 NOTE — Telephone Encounter (Signed)
Sent in and notified patient.  MPulliam, CMA/RT(R)

## 2016-09-13 ENCOUNTER — Other Ambulatory Visit: Payer: BLUE CROSS/BLUE SHIELD

## 2016-09-13 DIAGNOSIS — E038 Other specified hypothyroidism: Secondary | ICD-10-CM | POA: Diagnosis not present

## 2016-09-13 DIAGNOSIS — E785 Hyperlipidemia, unspecified: Secondary | ICD-10-CM | POA: Diagnosis not present

## 2016-09-13 DIAGNOSIS — Z0000 Encounter for general adult medical examination without abnormal findings: Secondary | ICD-10-CM | POA: Diagnosis not present

## 2016-09-13 DIAGNOSIS — I10 Essential (primary) hypertension: Secondary | ICD-10-CM | POA: Diagnosis not present

## 2016-09-14 LAB — HEMOGLOBIN A1C
ESTIMATED AVERAGE GLUCOSE: 117 mg/dL
Hgb A1c MFr Bld: 5.7 % — ABNORMAL HIGH (ref 4.8–5.6)

## 2016-09-14 LAB — CBC WITH DIFFERENTIAL/PLATELET
BASOS: 1 %
Basophils Absolute: 0 10*3/uL (ref 0.0–0.2)
EOS (ABSOLUTE): 0.1 10*3/uL (ref 0.0–0.4)
Eos: 2 %
Hematocrit: 46.4 % (ref 37.5–51.0)
Hemoglobin: 15.3 g/dL (ref 13.0–17.7)
IMMATURE GRANS (ABS): 0 10*3/uL (ref 0.0–0.1)
IMMATURE GRANULOCYTES: 0 %
LYMPHS: 29 %
Lymphocytes Absolute: 1.3 10*3/uL (ref 0.7–3.1)
MCH: 28.2 pg (ref 26.6–33.0)
MCHC: 33 g/dL (ref 31.5–35.7)
MCV: 86 fL (ref 79–97)
Monocytes Absolute: 0.3 10*3/uL (ref 0.1–0.9)
Monocytes: 7 %
NEUTROS PCT: 61 %
Neutrophils Absolute: 2.7 10*3/uL (ref 1.4–7.0)
PLATELETS: 184 10*3/uL (ref 150–379)
RBC: 5.43 x10E6/uL (ref 4.14–5.80)
RDW: 16.1 % — ABNORMAL HIGH (ref 12.3–15.4)
WBC: 4.4 10*3/uL (ref 3.4–10.8)

## 2016-09-14 LAB — TSH: TSH: 0.59 u[IU]/mL (ref 0.450–4.500)

## 2016-09-14 LAB — COMPREHENSIVE METABOLIC PANEL
A/G RATIO: 1.9 (ref 1.2–2.2)
ALBUMIN: 4.4 g/dL (ref 3.5–5.5)
ALK PHOS: 75 IU/L (ref 39–117)
ALT: 25 IU/L (ref 0–44)
AST: 32 IU/L (ref 0–40)
BILIRUBIN TOTAL: 0.4 mg/dL (ref 0.0–1.2)
BUN / CREAT RATIO: 14 (ref 9–20)
BUN: 19 mg/dL (ref 6–24)
CO2: 21 mmol/L (ref 20–29)
CREATININE: 1.38 mg/dL — AB (ref 0.76–1.27)
Calcium: 9.3 mg/dL (ref 8.7–10.2)
Chloride: 103 mmol/L (ref 96–106)
GFR calc Af Amer: 67 mL/min/{1.73_m2} (ref >59)
GFR calc non Af Amer: 58 mL/min/{1.73_m2} — ABNORMAL LOW (ref >59)
GLOBULIN, TOTAL: 2.3 g/dL (ref 1.5–4.5)
GLUCOSE: 95 mg/dL (ref 65–99)
POTASSIUM: 4.5 mmol/L (ref 3.5–5.2)
SODIUM: 140 mmol/L (ref 134–144)
Total Protein: 6.7 g/dL (ref 6.0–8.5)

## 2016-09-14 LAB — LIPID PANEL
CHOLESTEROL TOTAL: 202 mg/dL — AB (ref 100–199)
Chol/HDL Ratio: 4.4 ratio (ref 0.0–5.0)
HDL: 46 mg/dL (ref >39)
LDL CALC: 131 mg/dL — AB (ref 0–99)
TRIGLYCERIDES: 123 mg/dL (ref 0–149)
VLDL Cholesterol Cal: 25 mg/dL (ref 5–40)

## 2016-09-14 LAB — VITAMIN D 25 HYDROXY (VIT D DEFICIENCY, FRACTURES): VIT D 25 HYDROXY: 40 ng/mL (ref 30.0–100.0)

## 2016-09-28 ENCOUNTER — Ambulatory Visit (INDEPENDENT_AMBULATORY_CARE_PROVIDER_SITE_OTHER): Payer: BLUE CROSS/BLUE SHIELD | Admitting: Cardiovascular Disease

## 2016-09-28 ENCOUNTER — Encounter: Payer: Self-pay | Admitting: Cardiovascular Disease

## 2016-09-28 ENCOUNTER — Encounter: Payer: Self-pay | Admitting: Family Medicine

## 2016-09-28 ENCOUNTER — Ambulatory Visit (INDEPENDENT_AMBULATORY_CARE_PROVIDER_SITE_OTHER): Payer: BLUE CROSS/BLUE SHIELD | Admitting: Family Medicine

## 2016-09-28 VITALS — BP 137/82 | HR 66 | Ht 75.0 in | Wt 258.0 lb

## 2016-09-28 VITALS — BP 124/70 | HR 83 | Ht 75.0 in | Wt 257.0 lb

## 2016-09-28 DIAGNOSIS — I2510 Atherosclerotic heart disease of native coronary artery without angina pectoris: Secondary | ICD-10-CM | POA: Diagnosis not present

## 2016-09-28 DIAGNOSIS — I480 Paroxysmal atrial fibrillation: Secondary | ICD-10-CM

## 2016-09-28 DIAGNOSIS — K635 Polyp of colon: Secondary | ICD-10-CM | POA: Insufficient documentation

## 2016-09-28 DIAGNOSIS — Z0000 Encounter for general adult medical examination without abnormal findings: Secondary | ICD-10-CM

## 2016-09-28 MED ORDER — MELOXICAM 15 MG PO TABS: 15.0000 mg | ORAL_TABLET | Freq: Every day | ORAL | 1 refills | Status: DC

## 2016-09-28 NOTE — Progress Notes (Signed)
CARDIOLOGY OFFICE NOTE  Date:  09/28/2016    Isaiah [name], Isaiah [name] Date of Birth: May 21, 1960 Medical Record #[id number]  PCP:  [name], DO  Cardiologist:  Former patient of Dr. Yevonne Pax.   Now Vedanshi Massaro  Chief Complaint  Patient presents with  . Follow-up    HTN, chest pai    Problem list 1. History of recurrent pulmonary embolus 2. Hypercoagulability - Lupus anticoagulant  3. Hypertension 4. Osteoarthritis-status post hip replacements 5.  Paroxysmal atrial fibrillation 6. Lyme disease   Isaiah [name], Isaiah [name] is a 56 y.o. male who presents today for a 4 month check. Former patient of Dr. Yevonne Pax.   He has a history of prior PE and found to have a hypercoagulability state with abnormal lupus anticoagulant - he is committed to lifelong anticoagulation and is on Xarelto. He has had frequent DVT, PAF, hypothyroidism, HLD and HTN.  He has sleep apnea and is on CPAP - followed by Dr. Mayford Knife.   In January 2014 the patient experienced some chest tightness and underwent a treadmill Myoview stress test which showed no evidence of ischemia and his ejection fraction was 53% with no wall motion abnormalities.   Last seen in November. Cardiac status was stable.   Comes back today. Here alone. He is doing well. He had his hip replacement back in November - did well. Back on track with losing weight - says he is down 20 pounds. BP is lower. No chest pain. Breathing is good. Rhythm is good. Asking about reduction of medicines. His significant other has been diagnosed with breast cancer and he has been helping with her.    June 19, 2015:  Isaiah [name] is seen today for initial visit.   He is a former patient of [facility]. Has hx of PAF - none in the past 6-8 years.    Seems to be triggered by large doses of caffiene.  Has cardioverted on 1 occasion but usually he will convert back into NSR after a day or so .   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   No weight lifting due to a recent hernia repair .   Dec. 1, 2017:  Isaiah [name] is seen today in follow-up for his paroxysmal atrial fib ablation, diastolic dysfunction, and hypertension. No palps  June 21, 2016: Still very active.  Used to lift heavy weight and fight MMA . Does cardio regularly. Does interval  , ellipitical  Dental practice is going well.    No episodes of atrial fib - none in the past 6-8 years  Has hx of DVT ,  He walks frequently when flying   August 04, 2016:  Isaiah [name] is seen today for work in visit. He started having episodes of chest pain last week.  He went on vacation a week ago Ate too much salt, Came home ,  Took Lasix for swelling  Had a non productive cough.   Chest tightness.   Pressure like sensation No pleuretic CP,  Difficult to take a deep breath No fever , dry cough.  Felt a bit better after Z-pac.  But chest pressure persisted.  Feels different from his pulmonary embolus pain   Echo in 2014 showed normal LV function   Sept. 12, 2018:  Isaiah [name] is seen back after having an episode of chest pain  Coronary CT angio shows a prox LAD stenosis of 30%.  Calcium score of 44 (  72 percentile for age )  On Zetia,  Had problems with crestor ( elevated liver enzymes)  and Lipitor ( profound muscle aches )  .   Feeling better.  Still working out . No CP   Past Medical History:  Diagnosis Date  . Blood dyscrasia    lupus anticoagulant  . Diastolic dysfunction    Grade I per echo in 2/12  . DVT (deep venous thrombosis) (HCC) 2000's   "both legs in the calves; left went all the way up to my groin" (09/17/2012)  . Dysrhythmia   . GERD (gastroesophageal reflux disease)   . History of bronchitis   . Hypercoagulation syndrome (HCC)    secondary to circulating lupus anticoagulant  . Hyperlipidemia   . Hypertension   . Hypothyroidism    "wiped out from taking amiodarone" (09/17/2012)  . Lyme disease    history of   . Obesity (BMI  30-39.9) 07/17/2014  . PAF (paroxysmal atrial fibrillation) (HCC)    no recurrence since Feb 2012  . PONV (postoperative nausea and vomiting)    likes scopolomanine patch  . Pulmonary embolism (HCC) 09/17/2012   "just today; 3 on the right; 2 on the left" (09/17/2012)  . Situational stress   . Sleep apnea    uses CPAP    Past Surgical History:  Procedure Laterality Date  . CARDIOVASCULAR STRESS TEST  07/17/2009   EF 59%  . CARDIOVERSION    . DISTAL BICEPS TENDON REPAIR Right 2011  . INGUINAL HERNIA REPAIR Right 1992  . INGUINAL HERNIA REPAIR Left 2013  . INSERTION OF MESH N/A 05/14/2015   Procedure: INSERTION OF MESH;  Surgeon: Avel Peace, MD;  Location: [facility];  Service: General;  Laterality: N/A;  . SHOULDER ARTHROSCOPY W/ ROTATOR CUFF REPAIR Left 09/04/2012  . SHOULDER SURGERY     left  . TOTAL HIP ARTHROPLASTY Right 11/26/2014   Procedure: RIGHT TOTAL HIP ARTHROPLASTY ANTERIOR APPROACH;  Surgeon: Ollen Gross, MD;  Location: [facility];  Service: Orthopedics;  Laterality: Right;  . TRANSTHORACIC ECHOCARDIOGRAM  03/05/2010   EF 60-65%  . UMBILICAL HERNIA REPAIR N/A 05/14/2015   Procedure: HERNIA REPAIR UMBILICAL ADULT WITH MESH ;  Surgeon: Avel Peace, MD;  Location: [facility];  Service: General;  Laterality: N/A;  . WISDOM TOOTH EXTRACTION     lower 2     Medications: Current Outpatient Prescriptions  Medication Sig Dispense Refill  . amLODipine (NORVASC) 5 MG tablet Take 1 tablet (5 mg total) by mouth daily. 90 tablet 3  . ezetimibe (ZETIA) 10 MG tablet Take 1 tablet (10 mg total) by mouth daily. 90 tablet 3  . ibuprofen (ADVIL,MOTRIN) 200 MG tablet Take 200 mg by mouth every 6 (six) hours as needed.    Marland Kitchen lisinopril (PRINIVIL,ZESTRIL) 20 MG tablet Take 1 tablet (20 mg total) by mouth 2 (two) times daily. 180 tablet 3  . metoprolol succinate (TOPROL-XL) 25 MG 24 hr tablet TAKE 1 TABLET BY MOUTH EACH MORNING 90 tablet 3  . omega-3 acid ethyl esters (LOVAZA) 1 g capsule Take 1  capsule (1 g total) by mouth 2 (two) times daily. 180 capsule 3  . omeprazole (PRILOSEC) 20 MG capsule Take 1 capsule (20 mg total) by mouth daily. 30 capsule 0  . propafenone (RYTHMOL SR) 325 MG 12 hr capsule TAKE 1 CAPSULE BY MOUTH TWICE DAILY 180 capsule 3  . SYNTHROID 150 MCG tablet Take 300 mcg by mouth daily.   0  . XARELTO 20 MG TABS tablet  Take 1 tablet (20 mg total) by mouth daily. 90 tablet 3   No current facility-administered medications for this visit.     Allergies: Allergies  Allergen Reactions  . Crestor [Rosuvastatin Calcium] Other (See Comments)    hepatitis  . Lipitor [Atorvastatin Calcium] Other (See Comments)    Feels like someone hit him with 2x4    Social History: The patient  reports that he has never smoked. He has never used smokeless tobacco. He reports that he drinks about 4.8 oz of alcohol per week . He reports that he does not use drugs.   Family History: The patient's family history includes Cancer in his brother and mother; Heart attack in his maternal grandfather; Hyperlipidemia in his mother; Hypertension in his brother, father, and mother.   Review of Systems: Please see the history of present illness.   Otherwise, the review of systems is positive for none.   All other systems are reviewed and negative.   Physical Exam: VS:  BP 124/70   Pulse 83   Ht 6\' 3"  (1.905 m)   Wt 257 lb (116.6 kg)   SpO2 97%   BMI 32.12 kg/m  .  BMI Body mass index is 32.12 kg/m.  Wt Readings from Last 3 Encounters:  09/28/16 257 lb (116.6 kg)  08/04/16 245 lb 6.4 oz (111.3 kg)  07/29/16 254 lb (115.2 kg)    General: Pleasant. Well developed, well nourished and in no acute distress.    HEENT: Normal.  Neck: Supple, no JVD, carotid bruits, or masses noted.  Cardiac: Regular rate and rhythm. His heart tones are distant. No edema.  Respiratory:  Lungs are clear to auscultation bilaterally with normal work of breathing.  GI: Soft and nontender.  MS: No deformity  or atrophy. Gait and ROM intact.  Skin: Warm and dry. Color is normal.  Neuro:  Strength and sensation are intact and no gross focal deficits noted.  Psych: Alert, appropriate and with normal affect.   LABORATORY DATA:  EKG:  EKG is not ordered today.  Lab Results  Component Value Date   WBC 4.4 09/13/2016   HGB 15.3 09/13/2016   HCT 46.4 09/13/2016   PLT 184 09/13/2016   GLUCOSE 95 09/13/2016   CHOL 202 (H) 09/13/2016   TRIG 123 09/13/2016   HDL 46 09/13/2016   LDLCALC 131 (H) 09/13/2016   ALT 25 09/13/2016   AST 32 09/13/2016   NA 140 09/13/2016   K 4.5 09/13/2016   CL 103 09/13/2016   CREATININE 1.38 (H) 09/13/2016   BUN 19 09/13/2016   CO2 21 09/13/2016   TSH 0.590 09/13/2016   INR 1.17 05/13/2015   HGBA1C 5.7 (H) 09/13/2016    BNP (last 3 results) No results for input(s): BNP in the last 8760 hours.  ProBNP (last 3 results) No results for input(s): PROBNP in the last 8760 hours.   Other Studies Reviewed Today:  Echo Study Conclusions from 2014  Left ventricle: The cavity size was normal. Wall thickness was increased in a pattern of mild LVH. Systolic function was normal. The estimated ejection fraction was in the range of 55% to 60%. There was an increased relative contribution of atrial contraction to ventricular filling.   Myoview Impression from 2014 Exercise Capacity: Good exercise capacity. BP Response: Normal blood pressure response. Clinical Symptoms: Mild dyspnea ECG Impression: No significant ST segment change suggestive of ischemia. Comparison with Prior Nuclear Study: No significant change from previous study  Overall Impression: Low risk stress  nuclear study. No evidence for ischemia or infarction. EF mildly decreased.   LV Ejection Fraction: 53%. LV Wall Motion: Mild global hypokinesis.   Marca Ancona 02/06/2012  ECG:   NSR at 61.  Normal ECG   Assessment/Plan:  1. -  Chest tightness:  No further episodes of chest  tightness. We performed a coronary CT angiogram which did reveal a 30% LAD stenosis. His coronary calcium score was 44 which is in the 72nd percentile. We need to be more aggressive with his lipid lowering. He does not tolerate rosuvastatin or atorvastatin. I think that he will need to be started on Repatha or Pralulent . We will refer him to lipid clinic to get that started. Continue Zetia for now.  2. Hypercoagulable syndrome :  Has Lupus anticoagulant . Past history of pulmonary emboli and past history of DVTs.  Continue xarelto   3. Paroxysmal atrial fibrillation, currently maintaining normal sinus rhythm. Continue rythmol   4. Hypercholesterolemia -  We need to be more aggressive with his lipid lowering. He does not tolerate rosuvastatin or atorvastatin. I think that he will need to be started on Repatha or Pralulent . We will refer him to lipid clinic to get that started. Continue Zetia for now.  5. Essential hypertension - BP much lower continue with weight loss efforts.  6. Hypothyroidism followed by Dr. Evlyn Kanner  7. Osteoarthritis of right hip. Now s/p THR. Doing well.   Current medicines are reviewed with the patient today.  The patient does not have concerns regarding medicines other than what has been noted above.    Patient is agreeable to this plan and will call if any problems develop in the interim.   Will see him in 6 months .   Kristeen Miss, MD  09/28/2016 1:37 PM    [facility] [address] [place],  [address] [place], [place]  [address] Pager [phone] Phone: [phone]; Fax: [phone]

## 2016-09-28 NOTE — Progress Notes (Signed)
Male physical  Impression and Recommendations:    1. Encounter for wellness examination   2. Sessile colonic polyp- descending colon     No orders of the defined types were placed in this encounter.    No problem-specific Assessment & Plan notes found for this encounter.    Patient's Medications  New Prescriptions   MELOXICAM (MOBIC) 15 MG TABLET    Take 1 tablet (15 mg total) by mouth daily.  Previous Medications   AMLODIPINE (NORVASC) 5 MG TABLET    Take 1 tablet (5 mg total) by mouth daily.   EZETIMIBE (ZETIA) 10 MG TABLET    Take 1 tablet (10 mg total) by mouth daily.   LISINOPRIL (PRINIVIL,ZESTRIL) 20 MG TABLET    Take 1 tablet (20 mg total) by mouth 2 (two) times daily.   METOPROLOL SUCCINATE (TOPROL-XL) 25 MG 24 HR TABLET    TAKE 1 TABLET BY MOUTH EACH MORNING   OMEGA-3 ACID ETHYL ESTERS (LOVAZA) 1 G CAPSULE    Take 1 capsule (1 g total) by mouth 2 (two) times daily.   OMEPRAZOLE (PRILOSEC) 20 MG CAPSULE    Take 1 capsule (20 mg total) by mouth daily.   PROPAFENONE (RYTHMOL SR) 325 MG 12 HR CAPSULE    TAKE 1 CAPSULE BY MOUTH TWICE DAILY   SYNTHROID 150 MCG TABLET    Take 300 mcg by mouth daily.    XARELTO 20 MG TABS TABLET    Take 1 tablet (20 mg total) by mouth daily.  Modified Medications   No medications on file  Discontinued Medications   IBUPROFEN (ADVIL,MOTRIN) 200 MG TABLET    Take 200 mg by mouth every 6 (six) hours as needed.     Please see AVS handed out to patient at the end of our visit for further patient instructions/ counseling done pertaining to today's office visit.  1) Anticipatory Guidance: Discussed importance of wearing a seatbelt while driving, not texting while driving;   sunscreen when outside along with skin surveillance; eating a balanced and modest diet; physical activity at least 25 minutes per day or 150 min/ week moderate to intense activity.  2) Immunizations / Screenings / Labs:   All immunizations are up-to-date per  recommendations or will be updated today. Patient is due for dental and vision screens which pt will schedule independently. Will obtain CBC, CMP, HgA1c, Lipid panel, TSH and vit D when fasting, if not already done recently.   3) Weight:  BMI meaning discussed with patient.  Discussed goal of losing 5-10% of current body weight which would improve overall feelings of well being and improve objective health data. Improve nutrient density of diet through increasing intake of fruits and vegetables and decreasing saturated fats, white flour products and refined sugars.    Gross side effects, risk and benefits, and alternatives of medications discussed with patient.  Patient is aware that all medications have potential side effects and we are unable to predict every side effect or drug-drug interaction that may occur.  Expresses verbal understanding and consents to current therapy plan and treatment regimen.  Follow-up preventative CPE in 1 year. Follow-up office visit pending lab work.  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     Health Maintenance Summary Reviewed and updated,  unless pt declines services.  Aspirin: administering 81 mg daily Colonoscopy:    4\10\14- 1 sessile polyp descending colon removed.   Tdap: Up to date: needs TD  Pneumovax/PPSV23:  Up to date: see Immunizations. Prevnar 13/PCV13:    To be administered at this encounter. Zostavax:    Postponed. Tobacco History Reviewed:    CT scan for screening lung CA:    Abdominal Ultrasound:     ( Unnecessary secondary to < 43 or > 6 years old) Alcohol:    No concerns, no excessive use Exercise Habits:    STD concerns:   none Drug Use:   None Birth control method:   n/a Testicular/penile concerns:        Health Maintenance  Topic Date Due    INFLUENZA VACCINE  08/17/2016   Hepatitis C Screening  09/28/2017 (Originally May 23, 1960)   COLONOSCOPY  04/27/2022   TETANUS/TDAP  09/30/2025   HIV Screening  Completed      Wt Readings from Last 3 Encounters:  09/28/16 258 lb (117 kg)  09/28/16 257 lb (116.6 kg)  08/04/16 245 lb 6.4 oz (111.3 kg)   BP Readings from Last 3 Encounters:  09/28/16 137/82  09/28/16 124/70  08/18/16 110/61   Pulse Readings from Last 3 Encounters:  09/28/16 66  09/28/16 83  08/04/16 61    Patient Active Problem List   Diagnosis Date Noted   Chronic anticoagulation- abnormal lupus anticoagulant 08/27/2015    Priority: High   Other reactions to severe stress 08/27/2015    Priority: High   Dyslipidemia 05/10/2013    Priority: High   HTN (hypertension) 10/01/2010    Priority: High   h/o PE (pulmonary embolism) 09/17/2012    Priority: Medium   Sessile colonic polyp- descending colon 09/28/2016    Priority: Low   Personal history of multiple DVTs (deep vein thrombosis) 08/27/2015    Priority: Low   Hypothyroidism 12/17/2010    Priority: Low   Hypercoagulopathy (HCC) 08/05/2016   Seborrheic dermatitis of scalp 10/01/2015   Osteoarthritis of multiple joints 08/27/2015   CPAP (continuous positive airway pressure) dependence 08/27/2015   GERD (gastroesophageal reflux disease) 08/27/2015   h/o Lyme disease- 2014 tick bite 08/27/2015   Memory changes- since Lyme Dx 08/27/2015   Obesity (BMI 30-39.9) 07/17/2014   OSA (obstructive sleep apnea) 05/02/2014   History of chronic Myalgias since Lyme Dx 2014 05/10/2013   Chest pain 01/27/2012   PAF (paroxysmal atrial fibrillation) (HCC) 10/01/2010   High risk medication use 10/01/2010    Past Medical History:  Diagnosis Date   Blood dyscrasia    lupus anticoagulant   Diastolic dysfunction    Grade I per echo in 2/12   DVT (deep venous thrombosis) (HCC) 2000's   "both legs in the calves; left went all the way up to my groin" (09/17/2012)    Dysrhythmia    GERD (gastroesophageal reflux disease)    History of bronchitis    Hypercoagulation syndrome (HCC)    secondary to circulating lupus anticoagulant   Hyperlipidemia    Hypertension    Hypothyroidism    "wiped out from taking amiodarone" (09/17/2012)   Lyme disease    history of    Obesity (BMI 30-39.9) 07/17/2014   PAF (paroxysmal atrial fibrillation) (HCC)    no recurrence since Feb 2012   PONV (postoperative nausea and vomiting)    likes scopolomanine patch   Pulmonary embolism (HCC) 09/17/2012   "just today; 3 on the right; 2 on the left" (09/17/2012)  Situational stress    Sleep apnea    uses CPAP    Past Surgical History:  Procedure Laterality Date   CARDIOVASCULAR STRESS TEST  07/17/2009   EF 59%   CARDIOVERSION     DISTAL BICEPS TENDON REPAIR Right 2011   INGUINAL HERNIA REPAIR Right 1992   INGUINAL HERNIA REPAIR Left 2013   INSERTION OF MESH N/A 05/14/2015   Procedure: INSERTION OF MESH;  Surgeon: Avel Peace, MD;  Location: [facility];  Service: General;  Laterality: N/A;   SHOULDER ARTHROSCOPY W/ ROTATOR CUFF REPAIR Left 09/04/2012   SHOULDER SURGERY     left   TOTAL HIP ARTHROPLASTY Right 11/26/2014   Procedure: RIGHT TOTAL HIP ARTHROPLASTY ANTERIOR APPROACH;  Surgeon: Ollen Gross, MD;  Location: [facility];  Service: Orthopedics;  Laterality: Right;   TRANSTHORACIC ECHOCARDIOGRAM  03/05/2010   EF 60-65%   UMBILICAL HERNIA REPAIR N/A 05/14/2015   Procedure: HERNIA REPAIR UMBILICAL ADULT WITH MESH ;  Surgeon: Avel Peace, MD;  Location: [facility];  Service: General;  Laterality: N/A;   WISDOM TOOTH EXTRACTION     lower 2    Family History  Problem Relation Age of Onset   Hypertension Mother    Hyperlipidemia Mother    Cancer Mother        breast   Hypertension Father    Cancer Brother        prostate   Hypertension Brother    Heart attack Maternal Grandfather     History  Drug Use No  ,  History  Alcohol Use   4.8 oz/week   4 Cans of beer, 4  Shots of liquor per week    Comment: 09/17/2012 "1-2 beers and 1-2 tequila on Fri and Sat nights"  ,  History  Smoking Status   Never Smoker  Smokeless Tobacco   Never Used  ,  History  Sexual Activity   Sexual activity: Yes    Patient's Medications  New Prescriptions   MELOXICAM (MOBIC) 15 MG TABLET    Take 1 tablet (15 mg total) by mouth daily.  Previous Medications   AMLODIPINE (NORVASC) 5 MG TABLET    Take 1 tablet (5 mg total) by mouth daily.   EZETIMIBE (ZETIA) 10 MG TABLET    Take 1 tablet (10 mg total) by mouth daily.   LISINOPRIL (PRINIVIL,ZESTRIL) 20 MG TABLET    Take 1 tablet (20 mg total) by mouth 2 (two) times daily.   METOPROLOL SUCCINATE (TOPROL-XL) 25 MG 24 HR TABLET    TAKE 1 TABLET BY MOUTH EACH MORNING   OMEGA-3 ACID ETHYL ESTERS (LOVAZA) 1 G CAPSULE    Take 1 capsule (1 g total) by mouth 2 (two) times daily.   OMEPRAZOLE (PRILOSEC) 20 MG CAPSULE    Take 1 capsule (20 mg total) by mouth daily.   PROPAFENONE (RYTHMOL SR) 325 MG 12 HR CAPSULE    TAKE 1 CAPSULE BY MOUTH TWICE DAILY   SYNTHROID 150 MCG TABLET    Take 300 mcg by mouth daily.    XARELTO 20 MG TABS TABLET    Take 1 tablet (20 mg total) by mouth daily.  Modified Medications   No medications on file  Discontinued Medications   IBUPROFEN (ADVIL,MOTRIN) 200 MG TABLET    Take 200 mg by mouth every 6 (six) hours as needed.    Crestor [rosuvastatin calcium] and Lipitor [atorvastatin calcium]  Review of Systems: General:   Denies fever, chills, unexplained weight loss.  Optho/Auditory:   Denies  visual changes, blurred vision/LOV Respiratory:   Denies SOB, DOE more than baseline levels.   Cardiovascular:   Denies chest pain, palpitations, new onset peripheral edema  Gastrointestinal:   Denies nausea, vomiting, diarrhea.  Genitourinary: Denies dysuria, freq/ urgency, flank pain or discharge from genitals.  Endocrine:     Denies hot or cold intolerance, polyuria, polydipsia. Musculoskeletal:   Denies  unexplained myalgias, joint swelling, unexplained arthralgias, gait problems.  Skin:  Denies rash, suspicious lesions Neurological:     Denies dizziness, unexplained weakness, numbness  Psychiatric/Behavioral:   Denies mood changes, suicidal or homicidal ideations, hallucinations    Objective:     Blood pressure 137/82, pulse 66, height 6\' 3"  (1.905 m), weight 258 lb (117 kg). Body mass index is 32.25 kg/m. General Appearance:    Alert, cooperative, no distress, appears stated age  Head:    Normocephalic, without obvious abnormality, atraumatic  Eyes:    PERRL, conjunctiva/corneas clear, EOM's intact, fundi    benign, both eyes  Ears:    Normal TM's and external ear canals, both ears  Nose:   Nares normal, septum midline, mucosa normal, no drainage    or sinus tenderness  Throat:   Lips w/o lesion, mucosa moist, and tongue normal; teeth and   gums normal  Neck:   Supple, symmetrical, trachea midline, no adenopathy;    thyroid:  no enlargement/tenderness/nodules; no carotid   bruit or JVD  Back:     Symmetric, no curvature, ROM normal, no CVA tenderness  Lungs:     Clear to auscultation bilaterally, respirations unlabored, no       Wh/ R/ R  Chest Wall:    No tenderness or gross deformity; normal excursion   Heart:    Regular rate and rhythm, S1 and S2 normal, no murmur, rub   or gallop  Abdomen:     Soft, non-tender, bowel sounds active all four quadrants, NO   G/R/R, no masses, no organomegaly  Genitalia:    Ext genitalia: without lesion, no penile rash or discharge, no hernias appreciated   Rectal:    Normal tone, prostate WNL's and equal b/l, no tenderness; guaiac negative stool  Extremities:   Extremities normal, atraumatic, no cyanosis or gross edema  Pulses:   2+ and symmetric all extremities  Skin:   Warm, dry, Skin color, texture, turgor normal, no obvious rashes or lesions  M-Sk:   Ambulates * 4 w/o difficulty, no gross deformities, tone WNL  Neurologic:   CNII-XII  intact, normal strength, sensation and reflexes    Throughout Psych:  No HI/SI, judgement and insight good, Euthymic mood. Full Affect.

## 2016-09-28 NOTE — Patient Instructions (Signed)
Medication Instructions:  Your physician recommends that you continue on your current medications as directed. Please refer to the Current Medication list given to you today.   Labwork: None Ordered   Testing/Procedures: None Ordered   Follow-Up: You have been referred to [facility] for management of high cholesterol  Your physician wants you to follow-up in: 6 months with Dr. [name]. You will receive a reminder letter in the mail two months in advance. If you don't receive a letter, please call our office to schedule the follow-up appointment.   If you need a refill on your cardiac medications before your next appointment, please call your pharmacy.   Thank you for choosing [facility]! Brenisha Tsui, RN [phone]    

## 2016-09-28 NOTE — Patient Instructions (Signed)
Preventive Care for Adults  A healthy lifestyle and preventive care can promote health and wellness. Preventive health guidelines for men include the following key practices:  .   A routine yearly physical is a good way to check with your health care provider about your health and preventative screening. It is a chance to share any concerns and updates on your health and to receive a thorough exam.  .  Visit your dentist for a routine exam and preventative care every 6 months. Brush your teeth twice a day and floss once a day. Good oral hygiene prevents tooth decay and gum disease.  .  The frequency of eye exams is based on your age, health, family medical history, use of contact lenses, and other factors.  Follow your health care provider's recommendations for frequency of eye exams.  .  Eat a healthy diet.  Foods such as vegetables, fruits, whole grains, low-fat dairy products, and lean protein foods contain the nutrients you need without too many calories.  Decrease your intake of foods high in solid fats, added sugars, and salt.  Eat the right amount of calories for you.  Get information about a proper diet from your health care provider, if necessary.  .  Regular physical exercise is one of the most important things you can do for your health.  Most adults should get at least 150 minutes of moderate-intensity exercise (any activity that increases your heart rate and causes you to sweat) each week.  In addition, most adults need muscle-strengthening exercises on 2 or more days a week.  .  Maintain a healthy weight. The body mass index (BMI) is a screening tool to identify possible weight problems. It provides an estimate of body fat based on height and weight. Your health care provider can find your BMI and can help you achieve or maintain a healthy weight. For adults 20 years and older: A BMI below 18.5 is considered underweight. A BMI of 18.5 to 24.9 is normal. A BMI of 25 to 29.9 is  considered overweight. A BMI of 30 and above is considered obese.  .  Maintain normal blood lipids and cholesterol levels by exercising and minimizing your intake of saturated fat. Eat a balanced diet with plenty of fruit and vegetables. Blood tests for lipids and cholesterol should begin at age 20 and be repeated every 5 years. If your lipid or cholesterol levels are high, you are over 50, or you are at high risk for heart disease, you may need your cholesterol levels checked more frequently. Ongoing high lipid and cholesterol levels should be treated with medicines if diet and exercise are not working.  .  If you smoke, find out from your health care provider how to quit. If you do not use tobacco, do not start.  . If you choose to drink alcohol, do not have more than 2 drinks per day. One drink is considered to be 12 ounces (355 mL) of beer, 5 ounces (148 mL) of wine, or 1.5 ounces (44 mL) of liquor.  . Avoid use of street drugs. Do not share needles with anyone. Ask for help if you need support or instructions about stopping the use of drugs.  . High blood pressure causes heart disease and increases the risk of stroke. Your blood pressure should be checked at least every 1-2 years. Ongoing high blood pressure should be treated with medicines, if weight loss and exercise are not effective.  . If you are 45-79 years old,   ask your health care provider if you should take aspirin to prevent heart disease.  . Diabetes screening involves taking a blood sample to check your fasting blood sugar level.  This should be done once every 3 years, after age 45, if you are within normal weight and without risk factors for diabetes.  Testing should be considered at a younger age or be carried out more frequently if you are overweight and have at least 1 risk factor for diabetes.  . Colorectal cancer can be detected and often prevented. Most routine colorectal cancer screening begins at the age of 50 and  continues through age 75. However, your health care provider may recommend screening at an earlier age if you have risk factors for colon cancer. On a yearly basis, your health care provider may provide home test kits to check for hidden blood in the stool. Use of a small camera at the end of a tube to directly examine the colon (sigmoidoscopy or colonoscopy) can detect the earliest forms of colorectal cancer. Talk to your health care provider about this at age 50, when routine screening begins. Direct exam of the colon should be repeated every 5-10 years through age 75, unless early forms of precancerous polyps or small growths are found.  .  Lung cancer screening is recommended for adults aged 55-80 years who are at high risk for developing lung cancer because of a history of smoking. A yearly low-dose CT scan of the lungs is recommended for people who have at least a 30-pack-year history of smoking and are a current smoker or have quit within the past 15 years. A pack year of smoking is smoking an average of 1 pack of cigarettes a day for 1 year (for example: 1 pack a day for 30 years or 2 packs a day for 15 years). Yearly screening should continue until the smoker has stopped smoking for at least 15 years. Yearly screening should be stopped for people who develop a health problem that would prevent them from having lung cancer treatment.  . Talk with your health care provider about prostate cancer screening.  . Testicular cancer screening is recommended for adult males. Screening includes self-exam and a health care provider exam. Consult with your health care provider about any symptoms you have or any concerns you have about testicular cancer.  . Use sunscreen. Apply sunscreen liberally and repeatedly throughout the day. You should seek shade when your shadow is shorter than you. Protect yourself by wearing long sleeves, pants, a wide-brimmed hat, and sunglasses year round, whenever you are  outdoors.  . Once a month, do a whole-body skin exam, using a mirror to look at the skin on your back. Tell your health care provider about new moles, moles that have irregular borders, moles that are larger than a pencil eraser, or moles that have changed in shape or color.    ++++++++++++++++++++++++++++++++++++++++++++++++++++++++++++++++++  Stay current with required vaccines (immunizations).  ? Influenza vaccine. All adults should be immunized every year.  ? Tetanus, diphtheria, and acellular pertussis (Td, Tdap) vaccine. An adult who has not previously received Tdap or who does not know his vaccine status should receive 1 dose of Tdap. This initial dose should be followed by tetanus and diphtheria toxoids (Td) booster doses every 10 years. Adults with an unknown or incomplete history of completing a 3-dose immunization series with Td-containing vaccines should begin or complete a primary immunization series including a Tdap dose. Adults should receive a Td booster every   10 years.  ? Varicella vaccine. An adult without evidence of immunity to varicella should receive 2 doses or a second dose if he has previously received 1 dose.  ? Human papillomavirus (HPV) vaccine. Males aged 13-21 years who have not received the vaccine previously should receive the 3-dose series. Males aged 22-26 years may be immunized. Immunization is recommended through the age of 26 years for any male who has sex with males and did not get any or all doses earlier. Immunization is recommended for any person with an immunocompromised condition through the age of 26 years if he did not get any or all doses earlier. During the 3-dose series, the second dose should be obtained 4-8 weeks after the first dose. The third dose should be obtained 24 weeks after the first dose and 16 weeks after the second dose.  ? Zoster vaccine. One dose is recommended for adults aged 60 years or older unless certain conditions are  present.   ? PREVNAR - Pneumococcal 13-valent conjugate (PCV13) vaccine. When indicated, a person who is uncertain of his immunization history and has no record of immunization should receive the PCV13 vaccine. An adult aged 19 years or older who has certain medical conditions and has not been previously immunized should receive 1 dose of PCV13 vaccine. This PCV13 should be followed with a dose of pneumococcal polysaccharide (PPSV23) vaccine. The PPSV23 vaccine dose should be obtained at least 8 weeks after the dose of PCV13 vaccine. An adult aged 19 years or older who has certain medical conditions and previously received 1 or more doses of PPSV23 vaccine should receive 1 dose of PCV13. The PCV13 vaccine dose should be obtained 1 or more years after the last PPSV23 vaccine dose.   ? PNEUMOVAX - Pneumococcal polysaccharide (PPSV23) vaccine. When PCV13 is also indicated, PCV13 should be obtained first. All adults aged 65 years and older should be immunized. An adult younger than age 65 years who has certain medical conditions should be immunized. Any person who resides in a nursing home or long-term care facility should be immunized. An adult smoker should be immunized. People with an immunocompromised condition and certain other conditions should receive both PCV13 and PPSV23 vaccines. People with human immunodeficiency virus (HIV) infection should be immunized as soon as possible after diagnosis. Immunization during chemotherapy or radiation therapy should be avoided. Routine use of PPSV23 vaccine is not recommended for American Indians, [place] Natives, or people younger than 65 years unless there are medical conditions that require PPSV23 vaccine. When indicated, people who have unknown immunization and have no record of immunization should receive PPSV23 vaccine. One-time revaccination 5 years after the first dose of PPSV23 is recommended for people aged 19-64 years who have chronic kidney failure,  nephrotic syndrome, asplenia, or immunocompromised conditions. People who received 1-2 doses of PPSV23 before age 65 years should receive another dose of PPSV23 vaccine at age 65 years or later if at least 5 years have passed since the previous dose. Doses of PPSV23 are not needed for people immunized with PPSV23 at or after age 65 years.   ? Hepatitis A vaccine. Adults who wish to be protected from this disease, have certain high-risk conditions, work with hepatitis A-infected animals, work in hepatitis A research labs, or travel to or work in countries with a high rate of hepatitis A should be immunized. Adults who were previously unvaccinated and who anticipate close contact with an international adoptee during the first 60 days after arrival in the   [place] from a country with a high rate of hepatitis A should be immunized.  ? Hepatitis B vaccine. Adults should be immunized if they wish to be protected from this disease, have certain high-risk conditions, may be exposed to blood or other infectious body fluids, are household contacts or sex partners of hepatitis B positive people, are clients or workers in certain care facilities, or travel to or work in countries with a high rate of hepatitis B.     Preventive Service / Frequency  . Ages 40 to 64  Blood pressure check.  Lipid and cholesterol check  Lung cancer screening. / Every year if you are aged 55-80 years and have a 30-pack-year history of smoking and currently smoke or have quit within the past 15 years. Yearly screening is stopped once you have quit smoking for at least 15 years or develop a health problem that would prevent you from having lung cancer treatment.  Fecal occult blood test (FOBT) of stool. / Every year beginning at age 50 and continuing until age 75. You may not have to do this test if you get a colonoscopy every 10 years.  Flexible sigmoidoscopy** or colonoscopy.** / Every 5 years for a flexible  sigmoidoscopy or every 10 years for a colonoscopy beginning at age 50 and continuing until age 75. Screening for abdominal aortic aneurysm (AAA) by ultrasound is recommended for people who have history of high blood pressure or who are current or former smokers.  ++++++++++++++++++++++++++++++++++++++++++++++++++++++++++++++  Recommend Adult Low Dose Aspirin or coated Aspirin 81 mg daily To reduce risk of Colon Cancer 20 % Skin Cancer 26 %  Melanoma 46% and Pancreatic cancer 60%  +++++++++++++++++++++++++++++++++++++++++++++++++++++++++++++  Vitamin D goal is between 50-100. Please make sure that you are taking your Vitamin D as directed.  It is very important as a natural anti-inflammatory - helping with muscle and joint aches; as well as helping hair, skin, and nails; as well as reducing stroke, heart attack and cancer risk. It helps your bones and helps with mood. It also decreases numerous cancer risks so please take it as directed.  - Low Vit D is associated with a 200-300% higher risk for CANCER and 200-300% higher risk for HEART ATTACK & STROKE.  It is also associated with higher death rate at younger ages, autoimmune diseases like Rheumatoid arthritis, Lupus, Multiple Sclerosis; also many other serious conditions, like depression, Alzheimer's Dementia, infertility, muscle aches, fatigue, fibromyalgia - just to name a few.  +++++++++++++++++++++++++++++++++++++++++++++++++++++++++++  Recommend the book "The END of DIETING" by Dr Joel Fuhrman & the book "The END of DIABETES " by Dr Joel Fuhrman At [employer] - get book & Audio CD's   --->Being diabetic has a 300% increased risk for heart attack, stroke, cancer, and alzheimer- type vascular dementia. It is very important that you work harder with diet by avoiding all foods that are white. Avoid white rice (brown & wild rice is OK), white potatoes (sweet potatoes in moderation is OK), White bread or wheat bread or anything made out  of white flour like bagels, donuts, rolls, buns, biscuits, cakes, pastries, cookies, pizza crust, and pasta (made from white flour & egg whites) - vegetarian pasta or spinach or wheat pasta is OK. Multigrain breads like Arnold's or Pepper[employer], or multigrain sandwich thins or flatbreads. Diet, exercise and weight loss can reverse and cure diabetes in the early stages. Diet, exercise and weight loss is very important in the control and prevention of complications of diabetes which   affects every system in your body, ie. Brain - dementia/stroke, eyes - glaucoma/blindness, heart - heart attack/heart failure, kidneys - dialysis, stomach - gastric paralysis, intestines - malabsorption, nerves - severe painful neuritis, circulation - gangrene & loss of a leg(s), and finally cancer and Alzheimers.  I recommend avoid fried & greasy foods, sweets/candy, white rice (brown or wild rice or Quinoa is OK), white potatoes (sweet potatoes are OK) - anything made from white flour - bagels, doughnuts, rolls, buns, biscuits,white and wheat breads, pizza crust and traditional pasta made of white flour & egg white(vegetarian pasta or spinach or wheat pasta is OK). Multi-grain bread is OK - like multi-grain flat bread or sandwich thins. Avoid alcohol in excess.  Exercise is also important. Eat all the vegetables you want - avoid fatty meats, especially red meat and dairy - especially cheese. Cheese is the most concentrated form of trans-fats which is the worst thing to clog up our arteries. Veggie cheese is OK which can be found in the fresh produce section at [employer] or [employer] or [employer].  ++++++++++++++++++++++ DASH Eating Plan  DASH stands for "Dietary Approaches to Stop Hypertension."  The DASH eating plan is a healthy eating plan that has been shown to reduce high blood pressure (hypertension). Additional health benefits may include reducing the risk of type 2 diabetes mellitus, heart disease, and stroke.  The DASH eating plan may also help with weight loss.  WHAT DO I NEED TO KNOW ABOUT THE DASH EATING PLAN? For the DASH eating plan, you will follow these general guidelines: . Choose foods with a percent daily value for sodium of less than 5% (as listed on the food label). . Use salt-free seasonings or herbs instead of table salt or sea salt. . Check with your health care provider or pharmacist before using salt substitutes. . Eat lower-sodium products, often labeled as "lower sodium" or "no salt added." . Eat fresh foods. . Eat more vegetables, fruits, and low-fat dairy products. . Choose whole grains. Look for the word "whole" as the first word in the ingredient list. . Choose fish . Limit sweets, desserts, sugars, and sugary drinks. . Choose heart-healthy fats. . Eat veggie cheese . Eat more home-cooked food and less restaurant, buffet, and fast food. . Limit fried foods. . Cook foods using methods other than frying. . Limit canned vegetables. If you do use them, rinse them well to decrease the sodium. . When eating at a restaurant, ask that your food be prepared with less salt, or no salt if possible.   WHAT FOODS CAN I EAT? Read Dr Joel Fuhrman's books on The End of Dieting & The End of Diabetes  Grains Whole grain or whole wheat bread. Brown rice. Whole grain or whole wheat pasta. Quinoa, bulgur, and whole grain cereals. Low-sodium cereals. Corn or whole wheat flour tortillas. Whole grain cornbread. Whole grain crackers. Low-sodium crackers.  Vegetables Fresh or frozen vegetables (raw, steamed, roasted, or grilled). Low-sodium or reduced-sodium tomato and vegetable juices. Low-sodium or reduced-sodium tomato sauce and paste. Low-sodium or reduced-sodium canned vegetables.  Fruits All fresh, canned (in natural juice), or frozen fruits.  Protein Products All fish and seafood. Dried beans, peas, or lentils. Unsalted nuts and seeds. Unsalted canned  beans.  Dairy Low-fat dairy products, such as skim or 1% milk, 2% or reduced-fat cheeses, low-fat ricotta or cottage cheese, or plain low-fat yogurt. Low-sodium or reduced-sodium cheeses.  Fats and Oils Tub margarines without trans fats. Light or reduced-fat mayonnaise and salad   dressings (reduced sodium). Avocado. Safflower, olive, or canola oils. Natural peanut or almond butter.  Other Unsalted popcorn and pretzels. The items listed above may not be a complete list of recommended foods or beverages. Contact your dietitian for more options.  ++++++++++++++++++++++++++++++++++++++++++++++++++++++++++++++++  WHAT FOODS ARE NOT RECOMMENDED?  Grains/ White flour or wheat flour White bread. White pasta. White rice. Refined cornbread. Bagels and croissants. Crackers that contain trans fat. Vegetables Creamed or fried vegetables. Vegetables in a . Regular canned vegetables. Regular canned tomato sauce and paste. Regular tomato and vegetable juices. Fruits Dried fruits. Canned fruit in light or heavy syrup. Fruit juice. Meat and Other Protein Products Meat in general - RED meat & White meat. Fatty cuts of meat. Ribs, chicken wings, all processed meats as bacon, sausage, bologna, salami, fatback, hot dogs, bratwurst and packaged luncheon meats. Dairy Whole or 2% milk, cream, half-and-half, and cream cheese. Whole-fat or sweetened yogurt. Full-fat cheeses or blue cheese. Non-dairy creamers and whipped toppings. Processed cheese, cheese spreads, or cheese curds.  Condiments Onion and garlic salt, seasoned salt, table salt, and sea salt. Canned and packaged gravies. Worcestershire sauce. Tartar sauce. Barbecue sauce. Teriyaki sauce. Soy sauce, including reduced sodium. Steak sauce. Fish sauce. Oyster sauce. Cocktail sauce. Horseradish. Ketchup and mustard. Meat flavorings and tenderizers. Bouillon cubes. Hot sauce. Tabasco sauce. Marinades. Taco seasonings. Relishes. Fats and Oils Butter, stick  margarine, lard, shortening and bacon fat. Coconut, palm kernel, or palm oils. Regular salad dressings. Pickles and olives. Salted popcorn and pretzels. The items listed above may not be a complete list of foods and beverages to avoid. 

## 2016-09-30 DIAGNOSIS — L812 Freckles: Secondary | ICD-10-CM | POA: Diagnosis not present

## 2016-09-30 DIAGNOSIS — L738 Other specified follicular disorders: Secondary | ICD-10-CM | POA: Diagnosis not present

## 2016-09-30 DIAGNOSIS — L821 Other seborrheic keratosis: Secondary | ICD-10-CM | POA: Diagnosis not present

## 2016-09-30 DIAGNOSIS — L565 Disseminated superficial actinic porokeratosis (DSAP): Secondary | ICD-10-CM | POA: Diagnosis not present

## 2016-09-30 DIAGNOSIS — L570 Actinic keratosis: Secondary | ICD-10-CM | POA: Diagnosis not present

## 2016-10-05 DIAGNOSIS — N401 Enlarged prostate with lower urinary tract symptoms: Secondary | ICD-10-CM | POA: Diagnosis not present

## 2016-10-05 DIAGNOSIS — N50811 Right testicular pain: Secondary | ICD-10-CM | POA: Diagnosis not present

## 2016-10-05 LAB — PSA: PSA: 3.69

## 2016-10-27 ENCOUNTER — Other Ambulatory Visit: Payer: BLUE CROSS/BLUE SHIELD

## 2016-10-27 DIAGNOSIS — Z125 Encounter for screening for malignant neoplasm of prostate: Secondary | ICD-10-CM | POA: Diagnosis not present

## 2016-10-28 ENCOUNTER — Ambulatory Visit: Payer: BLUE CROSS/BLUE SHIELD

## 2016-10-28 LAB — PSA: Prostate Specific Ag, Serum: 2.7 ng/mL (ref 0.0–4.0)

## 2016-11-01 ENCOUNTER — Telehealth: Payer: Self-pay

## 2016-11-01 NOTE — Telephone Encounter (Signed)
Patient called requesting refill on Xanax 0.25mg  1 tablet bid.  Patient states that he is going on a cruise on Thursday. MPulliam, CMA/RT(R)

## 2016-11-01 NOTE — Telephone Encounter (Signed)
-   Called and spoke with patient about his PSA being elevated slightly at 2.7.  We sent this RESULT to his urologist.  Told patient he will need to get in touch with his urologist as we drew this as a courtesy for that doctor and for the pt to compare to result that resulted just 2 wks prior whic was over 5.0 per pt- I have not seen that result.   -  Also regarding his Xanax-  asked to he's been getting the medicines from as per our medical records in Epic I do not see anybody giving it to him except for Dr. Patty Sermons with last filled being 04/04/2016.  Patient does not recall who recent provider is.  Explained to patient that I need to know who he is been getting meds from.   --> Patient told me he will call the office first thing tomorrow morning [phone] with whom he's been getting the medicine from and at what practice.  Please ask him this information and document in the chart- please include why pt says he is seeing the providers at [facility] for .        ( because they are not specialists.  They are general internists- which pt cannot have 2 PCP's.   )  For our info ONLY: ( Per the [facility] controlled substances website, it appears patient has been seeing an internist at [facility] since before 12/18/2014 and getting Xanax from them continuously.  Patient had not told me he had 2 PCP providers, and had informed me that he only gotten this medicine ( xanax) from his cardiologist prior. )

## 2016-11-02 DIAGNOSIS — I480 Paroxysmal atrial fibrillation: Secondary | ICD-10-CM | POA: Diagnosis not present

## 2016-11-02 DIAGNOSIS — I2699 Other pulmonary embolism without acute cor pulmonale: Secondary | ICD-10-CM | POA: Diagnosis not present

## 2016-11-02 DIAGNOSIS — R972 Elevated prostate specific antigen [PSA]: Secondary | ICD-10-CM | POA: Diagnosis not present

## 2016-11-02 DIAGNOSIS — E038 Other specified hypothyroidism: Secondary | ICD-10-CM | POA: Diagnosis not present

## 2016-11-02 NOTE — Telephone Encounter (Signed)
[facility] did not need Dr Sharee Holsterpalski to refill the Xanax.  Tyler relayed the message to Dr Sharee Holsterpalski. MPulliam, CMA/RT(R)

## 2016-11-24 NOTE — Progress Notes (Addendum)
Patient ID: Isaiah [name], Isaiah [name]                 DOB: 06/19/1960                    MRN: [id number]     HPI: Isaiah [name], Isaiah [name] is a 56 y.o. male patient referred to lipid clinic by Dr. Elease HashimotoNahser to lipid clinic to discuss PCSK9s. PMH is significant for HTN, HLD, hypothyroidism, PAF, OSA on CPAP, frequent DVT and prior bilateral PE found to have hypercoagulability state with abnormal lupus anticoagulant on lifelong Xarelto. Recent coronary CT angiogram 08/18/16 revealed < 30% LAD stenosis. Coronary calcium score was 44 which is in the 72nd percentile.   Pt is a pleasant gentleman who presents today to discuss PCSK9 therapy. He has previously tried Crestor which caused elevated LFTs. He also took daily Lipitor 15 years ago which caused myalgias after 2 weeks of therapy. He does not recall dosing for either statin and records are not available in our paper chart records. He is currently tolerating Zetia 10mg  daily and fish oil 1g BID. He would like to pursue PCSK9i therapy.   Current Medications: ezetimibe 10mg  daily, fish oil 1g BID Intolerances: Crestor (unsure of dose-prescribed by Dr. Patty SermonsBrackbill) daily for 1-2 months- elevated liver enzymes, Lipitor daily ~15 years ago (unsure of dose - prescribed by Dr. Patty SermonsBrackbill) - profound muscle aches after 2 weeks and subsided within a couple of weeks of stopping  Risk Factors: chest pain/angina, HTN, family history of CAD, elevated calcium score and <30% LAD stenosis on CT angiogram LDL goal: < 70mg /dL  Diet:  Breakfast: protein shake  Lunch: grilled chicken and spinach salad from [employer] Dinner: baked chicken with rice and vegetables  Drinks: no caffeine, water    Exercise: Does cardio and weightlifting at the gym 4-5 days a week  Family History: Cancer in his brother and mother. MI and stroke in maternal and paternal grandfather. HLD in mother. HTN in brother, father, and mother.  Social History: Never smoker, no smokeless tobacco. Drinks about 4.8oz of  alcohol per week (1-2 beers and 1-2 tequila on Friday and Saturday nights). Doesn't use illicit drugs.  Most Recent Lipid Panel: 09/13/16 TC 202, TG 123, HDL 46, LDL 131 (Zetia 10mg  daily)  Past Medical History:  Diagnosis Date  . Blood dyscrasia    lupus anticoagulant  . Diastolic dysfunction    Grade I per echo in 2/12  . DVT (deep venous thrombosis) (HCC) 2000's   "both legs in the calves; left went all the way up to my groin" (09/17/2012)  . Dysrhythmia   . GERD (gastroesophageal reflux disease)   . History of bronchitis   . Hypercoagulation syndrome (HCC)    secondary to circulating lupus anticoagulant  . Hyperlipidemia   . Hypertension   . Hypothyroidism    "wiped out from taking amiodarone" (09/17/2012)  . Lyme disease    history of   . Obesity (BMI 30-39.9) 07/17/2014  . PAF (paroxysmal atrial fibrillation) (HCC)    no recurrence since Feb 2012  . PONV (postoperative nausea and vomiting)    likes scopolomanine patch  . Pulmonary embolism (HCC) 09/17/2012   "just today; 3 on the right; 2 on the left" (09/17/2012)  . Situational stress   . Sleep apnea    uses CPAP    Current Outpatient Medications on File Prior to Visit  Medication Sig Dispense Refill  . amLODipine (NORVASC) 5 MG tablet Take 1 tablet (  5 mg total) by mouth daily. 90 tablet 3  . ezetimibe (ZETIA) 10 MG tablet Take 1 tablet (10 mg total) by mouth daily. 90 tablet 3  . lisinopril (PRINIVIL,ZESTRIL) 20 MG tablet Take 1 tablet (20 mg total) by mouth 2 (two) times daily. 180 tablet 3  . meloxicam (MOBIC) 15 MG tablet Take 1 tablet (15 mg total) by mouth daily. 90 tablet 1  . metoprolol succinate (TOPROL-XL) 25 MG 24 hr tablet TAKE 1 TABLET BY MOUTH EACH MORNING 90 tablet 3  . omega-3 acid ethyl esters (LOVAZA) 1 g capsule Take 1 capsule (1 g total) by mouth 2 (two) times daily. 180 capsule 3  . omeprazole (PRILOSEC) 20 MG capsule Take 1 capsule (20 mg total) by mouth daily. 30 capsule 0  . propafenone (RYTHMOL SR)  325 MG 12 hr capsule TAKE 1 CAPSULE BY MOUTH TWICE DAILY 180 capsule 3  . SYNTHROID 150 MCG tablet Take 300 mcg by mouth daily.   0  . XARELTO 20 MG TABS tablet Take 1 tablet (20 mg total) by mouth daily. 90 tablet 3   No current facility-administered medications on file prior to visit.     Allergies  Allergen Reactions  . Crestor [Rosuvastatin Calcium] Other (See Comments)    hepatitis  . Lipitor [Atorvastatin Calcium] Other (See Comments)    Feels like someone hit him with 2x4    Assessment/Plan:  1. Hyperlipidemia - Last LDL 131 while on Zetia, which remains above patient's goal of <70 mg/dL due to history of chest pain, elevated calcium score, and <30% LAD stenosis on CT angiogram. Patient reports multiple statin intolerances. Patient's diet and exercise have improved. Discussed statin rechallenge vs PCSK9i therapy and pt would like to pursue PCSK9i. Discussed expected LDL lowering, side effects, cost/coverage, and injection technique of PCSK9i therapy. Will submit prior authorization for Repatha. Patient is willing to pay cash price if necessary.  Pt seen with Josiah LoboBrittany Xitlalli Newhard, P4 pharmacy student

## 2016-11-25 ENCOUNTER — Ambulatory Visit (INDEPENDENT_AMBULATORY_CARE_PROVIDER_SITE_OTHER): Payer: BLUE CROSS/BLUE SHIELD | Admitting: Pharmacist Clinician (PhC)/ Clinical Pharmacy Specialist

## 2016-11-25 ENCOUNTER — Ambulatory Visit (INDEPENDENT_AMBULATORY_CARE_PROVIDER_SITE_OTHER): Payer: BLUE CROSS/BLUE SHIELD

## 2016-11-25 VITALS — BP 121/75 | HR 71

## 2016-11-25 DIAGNOSIS — E782 Mixed hyperlipidemia: Secondary | ICD-10-CM

## 2016-11-25 DIAGNOSIS — Z23 Encounter for immunization: Secondary | ICD-10-CM

## 2016-11-25 NOTE — Patient Instructions (Signed)
It was great to see you today.  We will get your paper chart from your previous cardiologist and begin your paperwork for a PCSK9 injectable cholesterol medication (Repatha) as soon as we get your chart.   We will give you a call once we hear back from the pharmacy with your copay and go from there.  Evolocumab injection What is this medicine? EVOLOCUMAB (e voe LOK ue mab) is known as a PCSK9 inhibitor. It is used to lower the level of cholesterol in the blood. It may be used alone or in combination with other cholesterol-lowering drugs. This drug may also be used to reduce the risk of heart attack, stroke, and certain types of heart surgery in patients with heart disease. This medicine may be used for other purposes; ask your health care provider or pharmacist if you have questions. COMMON BRAND NAME(S): REPATHA What should I tell my health care provider before I take this medicine? They need to know if you have any of these conditions: -an unusual or allergic reaction to evolocumab, other medicines, foods, dyes, or preservatives -pregnant or trying to get pregnant -breast-feeding How should I use this medicine? This medicine is for injection under the skin. You will be taught how to prepare and give this medicine. Use exactly as directed. Take your medicine at regular intervals. Do not take your medicine more often than directed. It is important that you put your used needles and syringes in a special sharps container. Do not put them in a trash can. If you do not have a sharps container, call your pharmacist or health care provider to get one. Talk to your pediatrician regarding the use of this medicine in children. While this drug may be prescribed for children as young as 13 years for selected conditions, precautions do apply. Overdosage: If you think you have taken too much of this medicine contact a poison control center or emergency room at once. NOTE: This medicine is only for you. Do  not share this medicine with others. What if I miss a dose? If you miss a dose, take it as soon as you can if there are more than 7 days until the next scheduled dose, or skip the missed dose and take the next dose according to your original schedule. Do not take double or extra doses. What may interact with this medicine? Interactions are not expected. This list may not describe all possible interactions. Give your health care provider a list of all the medicines, herbs, non-prescription drugs, or dietary supplements you use. Also tell them if you smoke, drink alcohol, or use illegal drugs. Some items may interact with your medicine. What should I watch for while using this medicine? You may need blood work while you are taking this medicine. What side effects may I notice from receiving this medicine? Side effects that you should report to your doctor or health care professional as soon as possible: -allergic reactions like skin rash, itching or hives, swelling of the face, lips, or tongue -signs and symptoms of infection like fever or chills; cough; sore throat; pain or trouble passing urine Side effects that usually do not require medical attention (report to your doctor or health care professional if they continue or are bothersome): -diarrhea -nausea -muscle pain -pain, redness, or irritation at site where injected This list may not describe all possible side effects. Call your doctor for medical advice about side effects. You may report side effects to FDA at 1-800-FDA-1088. Where should I keep  my medicine? Keep out of the reach of children. You will be instructed on how to store this medicine. Throw away any unused medicine after the expiration date on the label. NOTE: This sheet is a summary. It may not cover all possible information. If you have questions about this medicine, talk to your doctor, pharmacist, or health care provider.  2018 Elsevier/Gold Standard (2015-12-21  13:21:53)

## 2016-11-25 NOTE — Progress Notes (Signed)
Pt here for influenza vaccine.  Screening questionnaire reviewed, VIS provided to patient, and any/all patient questions answered.  T. Prisha Hiley, CMA  

## 2016-12-03 DIAGNOSIS — G4733 Obstructive sleep apnea (adult) (pediatric): Secondary | ICD-10-CM | POA: Diagnosis not present

## 2016-12-13 ENCOUNTER — Telehealth: Payer: Self-pay | Admitting: Pharmacist

## 2016-12-13 MED ORDER — EVOLOCUMAB 140 MG/ML ~~LOC~~ SOAJ: 1.0000 "pen " | SUBCUTANEOUS | 11 refills | Status: DC

## 2016-12-13 NOTE — Telephone Encounter (Signed)
Pt returned call to clinic and is aware.

## 2016-12-13 NOTE — Telephone Encounter (Signed)
Pt approved for Repatha through 12/06/17. Rx sent to specialty pharmacy. LMOM for pt.

## 2016-12-22 ENCOUNTER — Other Ambulatory Visit: Payer: Self-pay | Admitting: Cardiovascular Disease

## 2016-12-22 NOTE — Telephone Encounter (Signed)
Pt last saw Dr Elease HashimotoNahser 09/28/16, last labs 09/13/16 Cr 1.38, age 56, weight 117kg, CrCl 101.27, based on CrCl pt is on appropriate dosage of Xarelto 20mg  QD.  Will refill rx.

## 2017-01-27 ENCOUNTER — Other Ambulatory Visit (INDEPENDENT_AMBULATORY_CARE_PROVIDER_SITE_OTHER): Payer: BLUE CROSS/BLUE SHIELD

## 2017-01-27 DIAGNOSIS — Z87898 Personal history of other specified conditions: Secondary | ICD-10-CM

## 2017-01-27 NOTE — Progress Notes (Incomplete)
Impression and Recommendations:    No diagnosis found.  1. *** 2.   No orders of the defined types were placed in this encounter.   No orders of the defined types were placed in this encounter.   Gross side effects, risk and benefits, and alternatives of medications and treatment plan in general discussed with patient.  Patient is aware that all medications have potential side effects and we are unable to predict every side effect or drug-drug interaction that may occur.   Patient will call with any questions prior to using medication if they have concerns.  Expresses verbal understanding and consents to current therapy and treatment regimen.  No barriers to understanding were identified.  Red flag symptoms and signs discussed in detail.  Patient expressed understanding regarding what to do in case of emergency\urgent symptoms  Please see AVS handed out to patient at the end of our visit for further patient instructions/ counseling done pertaining to today's office visit.   No Follow-up on file.    Note: This note was prepared with assistance of Dragon voice recognition software. Occasional wrong-word or sound-a-like substitutions may have occurred due to the inherent limitations of voice recognition software.   This document serves as a record of services personally performed by [name], DO. It was created on her behalf by [name], a trained medical scribe. The creation of this record is based on the scribe's personal observations and the provider's statements to them.   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   ***    Wt Readings from Last 3 Encounters:  09/28/16 258 lb (117 kg)  09/28/16 257 lb (116.6 kg)  08/04/16 245 lb 6.4 oz (111.3 kg)   BP Readings from Last 3 Encounters:  11/25/16 121/75  09/28/16 137/82  09/28/16 124/70   Pulse Readings from Last 3 Encounters:  11/25/16 71  09/28/16 66  09/28/16 83   BMI Readings from Last 3 Encounters:  09/28/16 32.25 kg/m  09/28/16 32.12 kg/m  08/04/16 30.67 kg/m     Patient Care Team    Relationship Specialty Notifications Start End  [name], DO PCP - General Family Medicine  08/26/15   Arminda Resides, MD Consulting Physician Dermatology  10/01/15   Quintella Reichert, MD Consulting Physician Cardiology  10/01/15    Comment: for OSA/  Sleep Med  [name], Deloris Ping, MD Consulting Physician Cardiology  10/01/15   Adrian Prince, MD Consulting Physician Endocrinology  05/16/16    Comment: sees him for hypothyroidism  Jethro Bolus, MD Consulting Physician Urology  05/16/16      Patient Active Problem List   Diagnosis Date Noted   Sessile colonic polyp- descending colon 09/28/2016   Hypercoagulopathy (HCC) 08/05/2016   Seborrheic dermatitis of scalp 10/01/2015   Osteoarthritis of multiple joints 08/27/2015   Chronic anticoagulation- abnormal lupus anticoagulant 08/27/2015   Personal history of multiple DVTs (deep vein thrombosis) 08/27/2015   Other reactions to severe stress 08/27/2015   CPAP (continuous positive airway pressure) dependence 08/27/2015   GERD (gastroesophageal reflux disease) 08/27/2015   h/o Lyme disease- 2014 tick bite 08/27/2015   Memory changes- since Lyme Dx 08/27/2015   Obesity (BMI 30-39.9) 07/17/2014   OSA (obstructive sleep apnea) 05/02/2014   Dyslipidemia 05/10/2013   History of  chronic Myalgias since Lyme Dx 2014 05/10/2013   h/o PE (pulmonary embolism) 09/17/2012   Chest pain 01/27/2012   Hypothyroidism 12/17/2010   HTN  (hypertension) 10/01/2010   PAF (paroxysmal atrial fibrillation) (HCC) 10/01/2010   High risk medication use 10/01/2010    Past Medical history, Surgical history, Family history, Social history, Allergies and Medications have been entered into the medical record, reviewed and changed as needed.    No outpatient medications have been marked as taking for the 01/27/17 encounter (Appointment) with Thomasene Lotpalski, Deborah, DO.    Allergies:  Allergies  Allergen Reactions   Crestor [Rosuvastatin Calcium] Other (See Comments)    hepatitis   Lipitor [Atorvastatin Calcium] Other (See Comments)    Feels like someone hit him with 2x4     Review of Systems:  A fourteen system review of systems was performed and found to be positive as per HPI.  ***  Objective:   There were no vitals taken for this visit. There is no height or weight on file to calculate BMI. General:  Well Developed, well nourished, appropriate for stated age.  Neuro:  Alert and oriented,  extra-ocular muscles intact  HEENT:  Normocephalic, atraumatic, neck supple, no carotid bruits appreciated  Skin:  no gross rash, warm, pink. Cardiac:  RRR, S1 S2 Respiratory:  ECTA B/L and A/P, Not using accessory muscles, speaking in full sentences- unlabored. Vascular:  Ext warm, no cyanosis apprec.; cap RF less 2 sec. Psych:  No HI/SI, judgement and insight good, Euthymic mood. Full Affect.

## 2017-01-28 LAB — PSA: Prostate Specific Ag, Serum: 2.4 ng/mL (ref 0.0–4.0)

## 2017-02-20 ENCOUNTER — Encounter: Payer: Self-pay | Admitting: Adult Health

## 2017-02-20 ENCOUNTER — Ambulatory Visit: Payer: BLUE CROSS/BLUE SHIELD | Admitting: Adult Health

## 2017-02-20 VITALS — BP 108/68 | HR 78 | Temp 99.1°F | Ht 75.0 in | Wt 267.7 lb

## 2017-02-20 DIAGNOSIS — M791 Myalgia, unspecified site: Secondary | ICD-10-CM | POA: Diagnosis not present

## 2017-02-20 DIAGNOSIS — R509 Fever, unspecified: Secondary | ICD-10-CM | POA: Diagnosis not present

## 2017-02-20 DIAGNOSIS — R059 Cough, unspecified: Secondary | ICD-10-CM

## 2017-02-20 DIAGNOSIS — R05 Cough: Secondary | ICD-10-CM

## 2017-02-20 LAB — POCT INFLUENZA A/B
Influenza A, POC: POSITIVE — AB
Influenza B, POC: NEGATIVE

## 2017-02-20 MED ORDER — HYDROCOD POLST-CPM POLST ER 10-8 MG/5ML PO SUER: 5.0000 mL | Freq: Two times a day (BID) | ORAL | 0 refills | Status: DC | PRN

## 2017-02-20 MED ORDER — OSELTAMIVIR PHOSPHATE 75 MG PO CAPS: 75.0000 mg | ORAL_CAPSULE | Freq: Two times a day (BID) | ORAL | 0 refills | Status: DC

## 2017-02-20 NOTE — Patient Instructions (Signed)
Influenza, Adult Influenza ("the flu") is an infection in the lungs, nose, and throat (respiratory tract). It is caused by a virus. The flu causes many common cold symptoms, as well as a high fever and body aches. It can make you feel very sick. The flu spreads easily from person to person (is contagious). Getting a flu shot (influenza vaccination) every year is the best way to prevent the flu. Follow these instructions at home:  Take over-the-counter and prescription medicines only as told by your doctor.  Use a cool mist humidifier to add moisture (humidity) to the air in your home. This can make it easier to breathe.  Rest as needed.  Drink enough fluid to keep your pee (urine) clear or pale yellow.  Cover your mouth and nose when you cough or sneeze.  Wash your hands with soap and water often, especially after you cough or sneeze. If you cannot use soap and water, use hand sanitizer.  Stay home from work or school as told by your doctor. Unless you are visiting your doctor, try to avoid leaving home until your fever has been gone for 24 hours without the use of medicine.  Keep all follow-up visits as told by your doctor. This is important. How is this prevented?  Getting a yearly (annual) flu shot is the best way to avoid getting the flu. You may get the flu shot in late summer, fall, or winter. Ask your doctor when you should get your flu shot.  Wash your hands often or use hand sanitizer often.  Avoid contact with people who are sick during cold and flu season.  Eat healthy foods.  Drink plenty of fluids.  Get enough sleep.  Exercise regularly. Contact a doctor if:  You get new symptoms.  You have: ? Chest pain. ? Watery poop (diarrhea). ? A fever.  Your cough gets worse.  You start to have more mucus.  You feel sick to your stomach (nauseous).  You throw up (vomit). Get help right away if:  You start to be short of breath or have trouble breathing.  Your  skin or nails turn a bluish color.  You have very bad pain or stiffness in your neck.  You get a sudden headache.  You get sudden pain in your face or ear.  You cannot stop throwing up. This information is not intended to replace advice given to you by your health care provider. Make sure you discuss any questions you have with your health care provider. Document Released: 10/13/2007 Document Revised: 06/11/2015 Document Reviewed: 10/28/2014 Elsevier Interactive Patient Education  2017 [employer]   + Flu Test Tamiflu 75mg  BID x 5 days Increase fluids/rest/vit c Alternate OTC Acetaminophen and OTC Ibuprofen as needed. Recommend remaining home at least 48 hrs FEEL BETTER!

## 2017-02-20 NOTE — Progress Notes (Signed)
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  He has been taking OTC Advil and Mucinex with only minimal sx relief, last dose of NSAIDs was this am at 0600 He denies CP/dyspnea at rest/N/V/D/abdomional pain. 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  Patient Care Team    Relationship Specialty Notifications Start End  [name], DO PCP - General Family Medicine  08/26/15   Arminda Resides, MD Consulting Physician Dermatology  10/01/15   Quintella Reichert, MD Consulting Physician Cardiology  10/01/15    Comment: for OSA/  Sleep Med  [name], Deloris Ping, MD Consulting Physician Cardiology  10/01/15   Adrian Prince, MD Consulting Physician Endocrinology  05/16/16    Comment: sees him for hypothyroidism  Jethro Bolus, MD Consulting Physician Urology  05/16/16     Patient Active Problem List   Diagnosis Date Noted  . Fever 02/20/2017  . Sessile colonic polyp- descending colon 09/28/2016  . Hypercoagulopathy (HCC) 08/05/2016  . Seborrheic dermatitis of scalp 10/01/2015  . Osteoarthritis of multiple joints 08/27/2015  . Chronic anticoagulation- abnormal lupus anticoagulant 08/27/2015  . Personal history of multiple DVTs (deep vein thrombosis) 08/27/2015  . Other reactions to severe stress 08/27/2015  . CPAP (continuous positive airway pressure) dependence 08/27/2015  . GERD (gastroesophageal reflux disease) 08/27/2015  . h/o Lyme disease- 2014 tick bite 08/27/2015  . Memory changes- since Lyme Dx 08/27/2015  . Obesity (BMI 30-39.9) 07/17/2014  . OSA (obstructive sleep apnea) 05/02/2014  . Dyslipidemia 05/10/2013  . History of chronic Myalgias since Lyme Dx 2014 05/10/2013  . Cough 09/28/2012  . h/o PE (pulmonary embolism) 09/17/2012   . Chest pain 01/27/2012  . Hypothyroidism 12/17/2010  . HTN (hypertension) 10/01/2010  . PAF (paroxysmal atrial fibrillation) (HCC) 10/01/2010  . High risk medication use 10/01/2010     Past Medical History:  Diagnosis Date  . Blood dyscrasia    lupus anticoagulant  . Diastolic dysfunction    Grade I per echo in 2/12  . DVT (deep venous thrombosis) (HCC) 2000's   "both legs in the calves; left went all the way up to my groin" (09/17/2012)  . Dysrhythmia   . GERD (gastroesophageal reflux disease)   . History of bronchitis   . Hypercoagulation syndrome (HCC)    secondary to circulating lupus anticoagulant  . Hyperlipidemia   . Hypertension   . Hypothyroidism    "wiped out from taking amiodarone" (09/17/2012)  . Lyme disease    history of   . Obesity (BMI 30-39.9) 07/17/2014  . PAF (paroxysmal atrial fibrillation) (HCC)    no recurrence since Feb 2012  . PONV (postoperative nausea and vomiting)    likes scopolomanine patch  . Pulmonary embolism (HCC) 09/17/2012   "just today; 3 on the right; 2 on the left" (09/17/2012)  . Situational stress   . Sleep apnea    uses CPAP     Past Surgical History:  Procedure Laterality Date  . CARDIOVASCULAR STRESS TEST  07/17/2009   EF 59%  . CARDIOVERSION    . DISTAL BICEPS TENDON REPAIR Right 2011  . INGUINAL HERNIA REPAIR Right 1992  . INGUINAL HERNIA REPAIR Left 2013  . INSERTION OF MESH N/A 05/14/2015   Procedure: INSERTION OF MESH;  Surgeon:  Avel Peaceodd Rosenbower, MD;  Location: [facility];  Service: General;  Laterality: N/A;  . SHOULDER ARTHROSCOPY W/ ROTATOR CUFF REPAIR Left 09/04/2012  . SHOULDER SURGERY     left  . TOTAL HIP ARTHROPLASTY Right 11/26/2014   Procedure: RIGHT TOTAL HIP ARTHROPLASTY ANTERIOR APPROACH;  Surgeon: Ollen GrossFrank Aluisio, MD;  Location: [facility];  Service: Orthopedics;  Laterality: Right;  . TRANSTHORACIC ECHOCARDIOGRAM  03/05/2010   EF 60-65%  . UMBILICAL HERNIA REPAIR N/A 05/14/2015   Procedure: HERNIA REPAIR UMBILICAL ADULT  WITH MESH ;  Surgeon: Avel Peaceodd Rosenbower, MD;  Location: [facility];  Service: General;  Laterality: N/A;  . WISDOM TOOTH EXTRACTION     lower 2     Family History  Problem Relation Age of Onset  . Hypertension Mother   . Hyperlipidemia Mother   . Cancer Mother        breast  . Hypertension Father   . Cancer Brother        prostate  . Hypertension Brother   . Heart attack Maternal Grandfather      Social History   Substance and Sexual Activity  Drug Use No     Social History   Substance and Sexual Activity  Alcohol Use Yes  . Alcohol/week: 4.8 oz  . Types: 4 Cans of beer, 4 Shots of liquor per week   Comment: 09/17/2012 "1-2 beers and 1-2 tequila on Fri and Sat nights"     Social History   Tobacco Use  Smoking Status Never Smoker  Smokeless Tobacco Never Used     Outpatient Encounter Medications as of 02/20/2017  Medication Sig  . amLODipine (NORVASC) 5 MG tablet Take 1 tablet (5 mg total) by mouth daily.  . Evolocumab (REPATHA SURECLICK) 140 MG/ML SOAJ Inject 1 pen into the skin every 14 (fourteen) days.  Marland Kitchen. ezetimibe (ZETIA) 10 MG tablet Take 1 tablet (10 mg total) by mouth daily.  Marland Kitchen. lisinopril (PRINIVIL,ZESTRIL) 20 MG tablet Take 1 tablet (20 mg total) by mouth 2 (two) times daily.  . metoprolol succinate (TOPROL-XL) 25 MG 24 hr tablet TAKE 1 TABLET BY MOUTH EACH MORNING  . omega-3 acid ethyl esters (LOVAZA) 1 g capsule Take 1 capsule (1 g total) by mouth 2 (two) times daily.  Marland Kitchen. omeprazole (PRILOSEC) 20 MG capsule Take 1 capsule (20 mg total) by mouth daily.  . propafenone (RYTHMOL SR) 325 MG 12 hr capsule TAKE 1 CAPSULE BY MOUTH TWICE DAILY  . SYNTHROID 150 MCG tablet Take 300 mcg by mouth daily.   Carlena Hurl. XARELTO 20 MG TABS tablet TAKE 1 TABLET BY MOUTH DAILY  . ALPRAZolam (XANAX) 0.25 MG tablet Take 1 tablet by mouth every 4 (four) hours as needed.  . chlorpheniramine-HYDROcodone (TUSSIONEX) 10-8 MG/5ML SUER Take 5 mLs by mouth every 12 (twelve) hours as needed for  cough.  Marland Kitchen. oseltamivir (TAMIFLU) 75 MG capsule Take 1 capsule (75 mg total) by mouth 2 (two) times daily.  . [DISCONTINUED] meloxicam (MOBIC) 15 MG tablet Take 1 tablet (15 mg total) by mouth daily.   No facility-administered encounter medications on file as of 02/20/2017.     Allergies: Crestor [rosuvastatin calcium] and Lipitor [atorvastatin calcium]  Body mass index is 33.46 kg/m.  Blood pressure 108/68, pulse 78, temperature 99.1 F (37.3 C), temperature source Oral, height 6\' 3"  (1.905 m), weight 267 lb 11.2 oz (121.4 kg), SpO2 97 %.     Review of Systems  Constitutional: Positive for activity change, appetite change, chills, diaphoresis, fatigue and fever. Negative for  unexpected weight change.  HENT: Positive for congestion, postnasal drip, rhinorrhea, sinus pressure, sinus pain and sore throat. Negative for sneezing, trouble swallowing and voice change.   Eyes: Negative for visual disturbance.  Respiratory: Positive for cough. Negative for chest tightness, shortness of breath, wheezing and stridor.   Cardiovascular: Negative for chest pain, palpitations and leg swelling.  Gastrointestinal: Negative for abdominal distention, abdominal pain, anal bleeding, blood in stool, constipation, diarrhea, nausea, rectal pain and vomiting.  Neurological: Negative for dizziness and headaches.  Psychiatric/Behavioral: Positive for sleep disturbance.       Objective:   Physical Exam  Constitutional: He is oriented to person, place, and time. He appears well-developed and well-nourished. He has a sickly appearance.  HENT:  Head: Normocephalic and atraumatic.  Right Ear: Tympanic membrane, external ear and ear canal normal. Tympanic membrane is not erythematous and not bulging. No decreased hearing is noted.  Left Ear: Hearing, tympanic membrane, external ear and ear canal normal. Tympanic membrane is not erythematous and not bulging. No decreased hearing is noted.  Nose: Mucosal edema and  rhinorrhea present. Right sinus exhibits no maxillary sinus tenderness and no frontal sinus tenderness. Left sinus exhibits no maxillary sinus tenderness and no frontal sinus tenderness.  Mouth/Throat: Uvula is midline and mucous membranes are normal. Posterior oropharyngeal edema and posterior oropharyngeal erythema present. No oropharyngeal exudate or tonsillar abscesses.  Enlarged tonsils  Eyes: Conjunctivae are normal. Pupils are equal, round, and reactive to light.  Neck: Normal range of motion. Neck supple.  Cardiovascular: Normal rate, regular rhythm, normal heart sounds and intact distal pulses.  No murmur heard. Pulmonary/Chest: Effort normal and breath sounds normal. No respiratory distress. He has no wheezes. He has no rales. He exhibits no tenderness.  Lymphadenopathy:    He has no cervical adenopathy.  Neurological: He is alert and oriented to person, place, and time.  Skin: Skin is warm and dry. No rash noted. He is not diaphoretic. No erythema. No pallor.  Psychiatric: He has a normal mood and affect. His behavior is normal. Judgment and thought content normal.  Nursing note and vitals reviewed.         Assessment & Plan:   1. Cough   2. Fever, unspecified fever cause   3. Myalgia     Fever + Flu Test Tamiflu 75mg  BID x 5 days Increase fluids/rest/vit c Alternate OTC Acetaminophen and OTC Ibuprofen as needed. Recommend remaining home at least 48 hrs FEEL BETTER!  Cough [place] Controlled Substance Database reviewed- no contraindication noted Given Tussionex Increase fluids/rest/vit c    FOLLOW-UP:  Return if symptoms worsen or fail to improve.

## 2017-02-20 NOTE — Assessment & Plan Note (Signed)
[place] Controlled Substance Database reviewed- no contraindication noted Given Tussionex Increase fluids/rest/vit c

## 2017-02-20 NOTE — Assessment & Plan Note (Signed)
+   Flu Test Tamiflu 75mg  BID x 5 days Increase fluids/rest/vit c Alternate OTC Acetaminophen and OTC Ibuprofen as needed. Recommend remaining home at least 48 hrs FEEL BETTER!

## 2017-03-06 ENCOUNTER — Other Ambulatory Visit: Payer: Self-pay | Admitting: Cardiovascular Disease

## 2017-03-06 DIAGNOSIS — I2699 Other pulmonary embolism without acute cor pulmonale: Secondary | ICD-10-CM | POA: Diagnosis not present

## 2017-03-06 DIAGNOSIS — D6859 Other primary thrombophilia: Secondary | ICD-10-CM | POA: Diagnosis not present

## 2017-03-06 DIAGNOSIS — E7849 Other hyperlipidemia: Secondary | ICD-10-CM | POA: Diagnosis not present

## 2017-03-06 DIAGNOSIS — E038 Other specified hypothyroidism: Secondary | ICD-10-CM | POA: Diagnosis not present

## 2017-03-06 DIAGNOSIS — I480 Paroxysmal atrial fibrillation: Secondary | ICD-10-CM | POA: Diagnosis not present

## 2017-03-06 DIAGNOSIS — Z1331 Encounter for screening for depression: Secondary | ICD-10-CM | POA: Diagnosis not present

## 2017-04-06 ENCOUNTER — Telehealth: Payer: Self-pay | Admitting: Pharmacist

## 2017-04-06 DIAGNOSIS — E782 Mixed hyperlipidemia: Secondary | ICD-10-CM

## 2017-04-06 NOTE — Addendum Note (Signed)
Addended by: Sheylin Scharnhorst E on: 04/06/2017 01:53 PM   Modules accepted: Orders

## 2017-04-06 NOTE — Telephone Encounter (Signed)
LMOM fo pt to see if he has started Repatha injections. He will need follow up lab work scheduled.

## 2017-04-06 NOTE — Telephone Encounter (Signed)
Spoke with pt - he reports no issues with Repatha injections. Scheduled f/u lipid panel and LFTs same day as OV with Dr Elease HashimotoNahser. Pt is aware to fast.

## 2017-04-07 DIAGNOSIS — L565 Disseminated superficial actinic porokeratosis (DSAP): Secondary | ICD-10-CM | POA: Diagnosis not present

## 2017-04-07 DIAGNOSIS — L820 Inflamed seborrheic keratosis: Secondary | ICD-10-CM | POA: Diagnosis not present

## 2017-04-07 DIAGNOSIS — D225 Melanocytic nevi of trunk: Secondary | ICD-10-CM | POA: Diagnosis not present

## 2017-04-07 DIAGNOSIS — L812 Freckles: Secondary | ICD-10-CM | POA: Diagnosis not present

## 2017-04-07 DIAGNOSIS — D2272 Melanocytic nevi of left lower limb, including hip: Secondary | ICD-10-CM | POA: Diagnosis not present

## 2017-05-02 ENCOUNTER — Other Ambulatory Visit: Payer: BLUE CROSS/BLUE SHIELD

## 2017-05-02 DIAGNOSIS — I10 Essential (primary) hypertension: Secondary | ICD-10-CM

## 2017-05-02 DIAGNOSIS — R350 Frequency of micturition: Secondary | ICD-10-CM | POA: Diagnosis not present

## 2017-05-02 DIAGNOSIS — E785 Hyperlipidemia, unspecified: Secondary | ICD-10-CM

## 2017-05-02 DIAGNOSIS — N401 Enlarged prostate with lower urinary tract symptoms: Secondary | ICD-10-CM

## 2017-05-03 LAB — COMPREHENSIVE METABOLIC PANEL
ALT: 21 IU/L (ref 0–44)
AST: 30 IU/L (ref 0–40)
Albumin/Globulin Ratio: 1.9 (ref 1.2–2.2)
Albumin: 4.6 g/dL (ref 3.5–5.5)
Alkaline Phosphatase: 77 IU/L (ref 39–117)
BUN/Creatinine Ratio: 13 (ref 9–20)
BUN: 20 mg/dL (ref 6–24)
Bilirubin Total: 0.5 mg/dL (ref 0.0–1.2)
CALCIUM: 10.3 mg/dL — AB (ref 8.7–10.2)
CO2: 26 mmol/L (ref 20–29)
CREATININE: 1.55 mg/dL — AB (ref 0.76–1.27)
Chloride: 100 mmol/L (ref 96–106)
GFR calc Af Amer: 57 mL/min/{1.73_m2} — ABNORMAL LOW (ref >59)
GFR, EST NON AFRICAN AMERICAN: 50 mL/min/{1.73_m2} — AB (ref >59)
GLOBULIN, TOTAL: 2.4 g/dL (ref 1.5–4.5)
Glucose: 92 mg/dL (ref 65–99)
Potassium: 4.1 mmol/L (ref 3.5–5.2)
Sodium: 144 mmol/L (ref 134–144)
Total Protein: 7 g/dL (ref 6.0–8.5)

## 2017-05-03 LAB — LIPID PANEL
CHOL/HDL RATIO: 3.1 ratio (ref 0.0–5.0)
Cholesterol, Total: 135 mg/dL (ref 100–199)
HDL: 43 mg/dL (ref >39)
LDL CALC: 72 mg/dL (ref 0–99)
TRIGLYCERIDES: 98 mg/dL (ref 0–149)
VLDL Cholesterol Cal: 20 mg/dL (ref 5–40)

## 2017-05-03 LAB — PSA: Prostate Specific Ag, Serum: 3.1 ng/mL (ref 0.0–4.0)

## 2017-05-17 DIAGNOSIS — D6862 Lupus anticoagulant syndrome: Secondary | ICD-10-CM | POA: Diagnosis not present

## 2017-05-17 DIAGNOSIS — Z1211 Encounter for screening for malignant neoplasm of colon: Secondary | ICD-10-CM | POA: Diagnosis not present

## 2017-05-18 ENCOUNTER — Telehealth: Payer: Self-pay | Admitting: *Deleted

## 2017-05-18 NOTE — Telephone Encounter (Signed)
   [name] Pre-operative Risk Assessment    Request for surgical clearance:  1. What type of surgery is being performed? Colonoscopy   2. When is this surgery scheduled? 05/25/17   3. What type of clearance is required (medical clearance vs. Pharmacy clearance to hold med vs. Both)? Both  4. Are there any medications that need to be held prior to surgery and how long?Xarelto   5. Practice name and name of physician performing surgery?   [facility]  Dr. Adriana Mccallum   6. What is your office phone number [phone]   7.   What is your office fax number  [phone]  8.   Anesthesia type (None, local, MAC, general) ? Propofol   [name], Isaiah [name] 05/18/2017, 12:46 PM  _________________________________________________________________   (provider comments below)

## 2017-05-18 NOTE — Telephone Encounter (Signed)
1) Routing to pharmacy for review of Xarelto per pre-op protocol.     Pre-op staff,    Primary Cardiologist: [name]  Chart reviewed as part of pre-operative protocol coverage. 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  Pre-op covering staff: - Please schedule appointment and call patient to inform them. - Please contact requesting surgeon's office via preferred method (i.e, phone, fax) to inform them of need for appointment prior to surgery.  Eula Listen, PA-C  05/18/2017, 1:56 PM

## 2017-05-18 NOTE — Telephone Encounter (Signed)
Patient with diagnosis of Afib with history of PE and multiple DVT on Xarelto for anticoagulation.    Procedure: colonscopy Date of procedure: 05/25/17  CHADS2-VASc score of  2 (CHF, HTN, AGE, DM2, stroke/tia x 2, CAD, AGE, male)  CrCl 92 ml/min  Per office protocol, patient can hold Xarelto for 24 hours prior to procedure.  With history of multiple VTE would recommend resume Xarelto as soon as safe post procedure.

## 2017-05-19 ENCOUNTER — Encounter

## 2017-05-19 ENCOUNTER — Ambulatory Visit: Payer: BLUE CROSS/BLUE SHIELD | Admitting: Cardiovascular Disease

## 2017-05-19 ENCOUNTER — Encounter: Payer: Self-pay | Admitting: Cardiovascular Disease

## 2017-05-19 ENCOUNTER — Other Ambulatory Visit: Payer: BLUE CROSS/BLUE SHIELD | Admitting: *Deleted

## 2017-05-19 VITALS — BP 134/86 | HR 69 | Ht 75.0 in | Wt 250.0 lb

## 2017-05-19 DIAGNOSIS — I10 Essential (primary) hypertension: Secondary | ICD-10-CM

## 2017-05-19 DIAGNOSIS — I480 Paroxysmal atrial fibrillation: Secondary | ICD-10-CM | POA: Diagnosis not present

## 2017-05-19 DIAGNOSIS — E782 Mixed hyperlipidemia: Secondary | ICD-10-CM

## 2017-05-19 NOTE — Telephone Encounter (Signed)
   Primary Cardiologist: No primary care provider on file.  Chart reviewed as part of pre-operative protocol coverage. 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  Pt was seen by Dr. Elease Hashimoto today.  Xarelto can be held 24 hours prior to procedure and resume as soon as possible afterward.   I will route this recommendation to the requesting party via Epic fax function and remove from pre-op pool.  Please call with questions.  Nada Boozer, NP 05/19/2017, 2:31 PM

## 2017-05-19 NOTE — Telephone Encounter (Signed)
Pt saw Dr. Elease Hashimoto in the office today, but I don't see anything re: Cardiac Clearance.  I will fwd to Dr. Elease Hashimoto for his review.

## 2017-05-19 NOTE — Patient Instructions (Signed)
Medication Instructions:  The current medical regimen is effective;  continue present plan and medications.  Labwork: None  Testing/Procedures: None  Follow-Up: Follow up in 6 months with Dr Elease Hashimoto.  You will receive a letter in the mail 2 months before you are due.  Please call us when you receive this letter to schedule your follow up appointment.  If you need a refill on your cardiac medications before your next appointment, please call your pharmacy.  Thank you for choosing [facility]!!

## 2017-05-19 NOTE — Telephone Encounter (Signed)
Lmtcb to go over recommendations in regards to holding Xarelto 24 hours prior to procedure

## 2017-05-19 NOTE — Progress Notes (Signed)
CARDIOLOGY OFFICE NOTE  Date:  05/19/2017    Isaiah [name], Isaiah [name] Date of Birth: 1960-09-21 Medical Record #[id number]  PCP:  [name], DO  Cardiologist:  Former patient of Dr. Yevonne Pax.   Now Isaiah [name]  Chief Complaint  Patient presents with  . Atrial Fibrillation   Problem list 1. History of recurrent pulmonary embolus 2. Hypercoagulability - Lupus anticoagulant  3. Hypertension 4. Osteoarthritis-status post hip replacements 5.  Paroxysmal atrial fibrillation 6. Lyme disease - 2014    Isaiah [name], Isaiah [name] is a 57 y.o. male who presents today for a 4 month check. Former patient of Dr. Yevonne Pax.   He has a history of prior PE and found to have a hypercoagulability state with abnormal lupus anticoagulant - he is committed to lifelong anticoagulation and is on Xarelto. He has had frequent DVT, PAF, hypothyroidism, HLD and HTN.  He has sleep apnea and is on CPAP - followed by Dr. Mayford Knife.   In January 2014 the patient experienced some chest tightness and underwent a treadmill Myoview stress test which showed no evidence of ischemia and his ejection fraction was 53% with no wall motion abnormalities.   Last seen in November. Cardiac status was stable.   Comes back today. Here alone. He is doing well. He had his hip replacement back in November - did well. Back on track with losing weight - says he is down 20 pounds. BP is lower. No chest pain. Breathing is good. Rhythm is good. Asking about reduction of medicines. His significant other has been diagnosed with breast cancer and he has been helping with her.    June 19, 2015:  Isaiah [name] is seen today for initial visit.   He is a former patient of [facility]. Has hx of PAF - none in the past 6-8 years.    Seems to be triggered by large doses of caffiene.  Has cardioverted on 1 occasion but usually he will convert back into NSR after a day or so .   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   No weight lifting due to a recent hernia repair .   Dec. 1, 2017:  Isaiah [name] is seen today in follow-up for his paroxysmal atrial fib ablation, diastolic dysfunction, and hypertension. No palps  June 21, 2016: Still very active.  Used to lift heavy weight and fight MMA . Does cardio regularly. Does interval  , ellipitical  Dental practice is going well.    No episodes of atrial fib - none in the past 6-8 years  Has hx of DVT ,  He walks frequently when flying   August 04, 2016:  Isaiah [name] is seen today for work in visit. He started having episodes of chest pain last week.  He went on vacation a week ago Ate too much salt, Came home ,  Took Lasix for swelling  Had a non productive cough.   Chest tightness.   Pressure like sensation No pleuretic CP,  Difficult to take a deep breath No fever , dry cough.  Felt a bit better after Z-pac.  But chest pressure persisted.  Feels different from his pulmonary embolus pain   Echo in 2014 showed normal LV function   Sept. 12, 2018:  Isaiah [name] is seen back after having an episode of chest pain  Coronary CT angio shows a prox LAD stenosis of 30%.  Calcium score of 44 ( 72 percentile  for age )  On Zetia,  Had problems with crestor ( elevated liver enzymes)  and Lipitor ( profound muscle aches )  .   Feeling better.  Still working out . No CP   May 19, 2017:   Doing well. Went on [employer] and lost 20 lbs.   Feeling well .  Has not had any recurrent PAF  - last episode was 10 years ago   Past Medical History:  Diagnosis Date  . Blood dyscrasia    lupus anticoagulant  . Diastolic dysfunction    Grade I per echo in 2/12  . DVT (deep venous thrombosis) (HCC) 2000's   "both legs in the calves; left went all the way up to my groin" (09/17/2012)  . Dysrhythmia   . GERD (gastroesophageal reflux disease)   . History of bronchitis   . Hypercoagulation syndrome (HCC)    secondary to circulating lupus anticoagulant  .  Hyperlipidemia   . Hypertension   . Hypothyroidism    "wiped out from taking amiodarone" (09/17/2012)  . Lyme disease    history of   . Obesity (BMI 30-39.9) 07/17/2014  . PAF (paroxysmal atrial fibrillation) (HCC)    no recurrence since Feb 2012  . PONV (postoperative nausea and vomiting)    likes scopolomanine patch  . Pulmonary embolism (HCC) 09/17/2012   "just today; 3 on the right; 2 on the left" (09/17/2012)  . Situational stress   . Sleep apnea    uses CPAP    Past Surgical History:  Procedure Laterality Date  . CARDIOVASCULAR STRESS TEST  07/17/2009   EF 59%  . CARDIOVERSION    . DISTAL BICEPS TENDON REPAIR Right 2011  . INGUINAL HERNIA REPAIR Right 1992  . INGUINAL HERNIA REPAIR Left 2013  . INSERTION OF MESH N/A 05/14/2015   Procedure: INSERTION OF MESH;  Surgeon: Avel Peace, MD;  Location: [facility];  Service: General;  Laterality: N/A;  . SHOULDER ARTHROSCOPY W/ ROTATOR CUFF REPAIR Left 09/04/2012  . SHOULDER SURGERY     left  . TOTAL HIP ARTHROPLASTY Right 11/26/2014   Procedure: RIGHT TOTAL HIP ARTHROPLASTY ANTERIOR APPROACH;  Surgeon: Ollen Gross, MD;  Location: [facility];  Service: Orthopedics;  Laterality: Right;  . TRANSTHORACIC ECHOCARDIOGRAM  03/05/2010   EF 60-65%  . UMBILICAL HERNIA REPAIR N/A 05/14/2015   Procedure: HERNIA REPAIR UMBILICAL ADULT WITH MESH ;  Surgeon: Avel Peace, MD;  Location: [facility];  Service: General;  Laterality: N/A;  . WISDOM TOOTH EXTRACTION     lower 2     Medications: Current Outpatient Medications  Medication Sig Dispense Refill  . ALPRAZolam (XANAX) 0.25 MG tablet Take 1 tablet by mouth every 4 (four) hours as needed.  4  . amLODipine (NORVASC) 5 MG tablet Take 1 tablet (5 mg total) by mouth daily. 90 tablet 3  . Evolocumab (REPATHA SURECLICK) 140 MG/ML SOAJ Inject 1 pen into the skin every 14 (fourteen) days. 2 pen 11  . ezetimibe (ZETIA) 10 MG tablet Take 1 tablet (10 mg total) by mouth daily. 90 tablet 3  . lisinopril  (PRINIVIL,ZESTRIL) 20 MG tablet Take 1 tablet (20 mg total) by mouth 2 (two) times daily. 180 tablet 3  . metoprolol succinate (TOPROL-XL) 25 MG 24 hr tablet TAKE 1 TABLET BY MOUTH EACH MORNING 90 tablet 3  . omega-3 acid ethyl esters (LOVAZA) 1 g capsule TAKE 1 CAPSULE BY MOUTH TWICE DAILY 180 capsule 1  . omeprazole (PRILOSEC) 20 MG capsule Take 1 capsule (  20 mg total) by mouth daily. 30 capsule 0  . propafenone (RYTHMOL SR) 325 MG 12 hr capsule TAKE 1 CAPSULE BY MOUTH TWICE DAILY 180 capsule 3  . SYNTHROID 150 MCG tablet Take 300 mcg by mouth daily.   0  . XARELTO 20 MG TABS tablet TAKE 1 TABLET BY MOUTH DAILY 90 tablet 2   No current facility-administered medications for this visit.     Allergies: Allergies  Allergen Reactions  . Crestor [Rosuvastatin Calcium] Other (See Comments)    hepatitis  . Lipitor [Atorvastatin Calcium] Other (See Comments)    Feels like someone hit him with 2x4    Social History: The patient  reports that he has never smoked. He has never used smokeless tobacco. He reports that he drinks about 4.8 oz of alcohol per week. He reports that he does not use drugs.   Family History: The patient's family history includes Cancer in his brother and mother; Heart attack in his maternal grandfather; Hyperlipidemia in his mother; Hypertension in his brother, father, and mother.   Review of Systems: Please see the history of present illness.   Otherwise, the review of systems is positive for none.   All other systems are reviewed and negative.   Physical Exam: Blood pressure 134/86, pulse 69, height  (1.905 m), weight 250 lb (113.4 kg).  GEN:  Well nourished, well developed in no acute distress HEENT: Normal NECK: No JVD; No carotid bruits LYMPHATICS: No lymphadenopathy CARDIAC: RR , no murmurs RESPIRATORY:  Clear to auscultation without rales, wheezing or rhonchi  ABDOMEN: Soft, non-tender, non-distended MUSCULOSKELETAL:  No edema; No deformity  SKIN:  Warm and dry NEUROLOGIC:  Alert and oriented x 3   LABORATORY DATA:  EKG:   May 19, 2017: Normal sinus rhythm at 69.  Normal EKG.  Lab Results  Component Value Date   WBC 4.4 09/13/2016   HGB 15.3 09/13/2016   HCT 46.4 09/13/2016   PLT 184 09/13/2016   GLUCOSE 92 05/02/2017   CHOL 135 05/02/2017   TRIG 98 05/02/2017   HDL 43 05/02/2017   LDLCALC 72 05/02/2017   ALT 21 05/02/2017   AST 30 05/02/2017   NA 144 05/02/2017   K 4.1 05/02/2017   CL 100 05/02/2017   CREATININE 1.55 (H) 05/02/2017   BUN 20 05/02/2017   CO2 26 05/02/2017   TSH 0.590 09/13/2016   PSA 3.69 10/05/2016   INR 1.17 05/13/2015   HGBA1C 5.7 (H) 09/13/2016    BNP (last 3 results) No results for input(s): BNP in the last 8760 hours.  ProBNP (last 3 results) No results for input(s): PROBNP in the last 8760 hours.   Other Studies Reviewed Today:  Echo Study Conclusions from 2014  Left ventricle: The cavity size was normal. Wall thickness was increased in a pattern of mild LVH. Systolic function was normal. The estimated ejection fraction was in the range of 55% to 60%. There was an increased relative contribution of atrial contraction to ventricular filling.   Myoview Impression from 2014 Exercise Capacity: Good exercise capacity. BP Response: Normal blood pressure response. Clinical Symptoms: Mild dyspnea ECG Impression: No significant ST segment change suggestive of ischemia. Comparison with Prior Nuclear Study: No significant change from previous study  Overall Impression: Low risk stress nuclear study. No evidence for ischemia or infarction. EF mildly decreased.   LV Ejection Fraction: 53%. LV Wall Motion: Mild global hypokinesis.   ECG:   May 19, 2017:   NSR at 88.  Normal ECG   Assessment/Plan:  1. -  Chest tightness:  No further episodes of chest tightness. We performed a coronary CT angiogram which did reveal a 30% LAD stenosis. His coronary calcium score was 44 which is in  the 72nd percentile.  2. Hypercoagulable syndrome :   Continue Xarelto.  3. Paroxysmal atrial fibrillation,   he has not had any further episodes of atrial fibrillation since being on per path known.  His last episode was approximately 10 years ago.  4. Hypercholesterolemia -his lipids drawn earlier this month look good.  His LDL 72.  Triglyceride level is 99.  5. Essential hypertension -blood pressures well controlled.  Continue current medications.  6. Hypothyroidism followed by Dr. Evlyn Kanner  7. Osteoarthritis of right hip. Now s/p THR. Doing well.   Current medicines are reviewed with the patient today.  The patient does not have concerns regarding medicines other than what has been noted above.  Patient is agreeable to this plan and will call if any problems develop in the interim.   Will see him in 6 months .   Kristeen Miss, MD  05/19/2017 9:30 AM    [facility] [address] [place],  [address] [place], [place]  [address] Pager [phone] Phone: [phone]; Fax: [phone]

## 2017-05-22 NOTE — Telephone Encounter (Signed)
Pt called back and has been advised to hold Xarelto 24 hours prior to procedure. Pt thanked me for our help. I will let Dr. Haywood Pao office know of recommendations as well.

## 2017-05-25 DIAGNOSIS — D124 Benign neoplasm of descending colon: Secondary | ICD-10-CM | POA: Diagnosis not present

## 2017-05-25 DIAGNOSIS — K635 Polyp of colon: Secondary | ICD-10-CM | POA: Diagnosis not present

## 2017-05-25 DIAGNOSIS — Z1211 Encounter for screening for malignant neoplasm of colon: Secondary | ICD-10-CM | POA: Diagnosis not present

## 2017-05-26 DIAGNOSIS — R3912 Poor urinary stream: Secondary | ICD-10-CM | POA: Diagnosis not present

## 2017-05-26 DIAGNOSIS — N401 Enlarged prostate with lower urinary tract symptoms: Secondary | ICD-10-CM | POA: Diagnosis not present

## 2017-05-26 DIAGNOSIS — R351 Nocturia: Secondary | ICD-10-CM | POA: Diagnosis not present

## 2017-06-06 ENCOUNTER — Other Ambulatory Visit: Payer: Self-pay | Admitting: Family Medicine

## 2017-06-14 ENCOUNTER — Other Ambulatory Visit: Payer: Self-pay | Admitting: Family Medicine

## 2017-06-14 NOTE — Telephone Encounter (Signed)
I am unsure from last OV note for Dr. Sharee Holster when pt was to follow up for HTN.  Please review and refill if appropriate.  Tiajuana Amass, CMA

## 2017-06-22 ENCOUNTER — Other Ambulatory Visit: Payer: Self-pay | Admitting: Cardiovascular Disease

## 2017-07-03 ENCOUNTER — Other Ambulatory Visit: Payer: Self-pay | Admitting: Cardiovascular Disease

## 2017-07-05 DIAGNOSIS — I480 Paroxysmal atrial fibrillation: Secondary | ICD-10-CM | POA: Diagnosis not present

## 2017-07-05 DIAGNOSIS — E7849 Other hyperlipidemia: Secondary | ICD-10-CM | POA: Diagnosis not present

## 2017-07-05 DIAGNOSIS — E038 Other specified hypothyroidism: Secondary | ICD-10-CM | POA: Diagnosis not present

## 2017-07-05 DIAGNOSIS — N183 Chronic kidney disease, stage 3 (moderate): Secondary | ICD-10-CM | POA: Diagnosis not present

## 2017-07-05 DIAGNOSIS — D6859 Other primary thrombophilia: Secondary | ICD-10-CM | POA: Diagnosis not present

## 2017-07-27 ENCOUNTER — Other Ambulatory Visit: Payer: Self-pay | Admitting: Family Medicine

## 2017-07-28 DIAGNOSIS — M238X1 Other internal derangements of right knee: Secondary | ICD-10-CM | POA: Diagnosis not present

## 2017-07-28 DIAGNOSIS — M25561 Pain in right knee: Secondary | ICD-10-CM | POA: Diagnosis not present

## 2017-08-03 ENCOUNTER — Encounter: Payer: Self-pay | Admitting: Cardiology

## 2017-08-03 ENCOUNTER — Ambulatory Visit: Payer: BLUE CROSS/BLUE SHIELD | Admitting: Cardiology

## 2017-08-03 VITALS — BP 117/75 | HR 66 | Ht 75.0 in | Wt 245.0 lb

## 2017-08-03 DIAGNOSIS — E669 Obesity, unspecified: Secondary | ICD-10-CM

## 2017-08-03 DIAGNOSIS — G4733 Obstructive sleep apnea (adult) (pediatric): Secondary | ICD-10-CM

## 2017-08-03 DIAGNOSIS — I10 Essential (primary) hypertension: Secondary | ICD-10-CM

## 2017-08-03 NOTE — Progress Notes (Signed)
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    Referring MD: [name], DO   Chief Complaint  Patient presents with  . Sleep Apnea  . 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 He had oxygen desaturations as low as 82%. His Epworth sleepiness scale was elevated at 18 prior to going on CPAP.  he is doing well with his CPAP device and thinks that he has gotten used to it.  He tolerates the mask and feels the pressure is adequate.  Since going on CPAP he feels rested in the am and has no significant daytime sleepiness.  He denies any significant mouth or nasal dryness or nasal congestion.  He does not think that he snores.     Past Medical History:  Diagnosis Date  . Blood dyscrasia    lupus anticoagulant  . Diastolic dysfunction    Grade I per echo in 2/12  . DVT (deep venous thrombosis) (HCC) 2000's   "both legs in the calves; left went all the way up to my groin" (09/17/2012)  . Dysrhythmia   . GERD (gastroesophageal reflux disease)   . History of bronchitis   . Hypercoagulation syndrome (HCC)    secondary to circulating lupus anticoagulant  . Hyperlipidemia   . Hypertension   . Hypothyroidism    "wiped out from taking amiodarone" (09/17/2012)  . Lyme disease    history of   . Obesity (BMI 30-39.9) 07/17/2014  . PAF (paroxysmal atrial fibrillation) (HCC)    no recurrence since Feb 2012  . PONV (postoperative nausea and vomiting)    likes scopolomanine patch  . Pulmonary embolism (HCC) 09/17/2012   "just today; 3 on the right; 2 on the left" (09/17/2012)  . Situational stress   . Sleep apnea    uses CPAP    Past Surgical History:  Procedure Laterality Date  . CARDIOVASCULAR STRESS TEST  07/17/2009   EF 59%  . CARDIOVERSION      . DISTAL BICEPS TENDON REPAIR Right 2011  . INGUINAL HERNIA REPAIR Right 1992  . INGUINAL HERNIA REPAIR Left 2013  . INSERTION OF MESH N/A 05/14/2015   Procedure: INSERTION OF MESH;  Surgeon: Avel Peace, MD;  Location: [facility];  Service: General;  Laterality: N/A;  . SHOULDER ARTHROSCOPY W/ ROTATOR CUFF REPAIR Left 09/04/2012  . SHOULDER SURGERY     left  . TOTAL HIP ARTHROPLASTY Right 11/26/2014   Procedure: RIGHT TOTAL HIP ARTHROPLASTY ANTERIOR APPROACH;  Surgeon: Ollen Gross, MD;  Location: [facility];  Service: Orthopedics;  Laterality: Right;  . TRANSTHORACIC ECHOCARDIOGRAM  03/05/2010   EF 60-65%  . UMBILICAL HERNIA REPAIR N/A 05/14/2015   Procedure: HERNIA REPAIR UMBILICAL ADULT WITH MESH ;  Surgeon: Avel Peace, MD;  Location: [facility];  Service: General;  Laterality: N/A;  . WISDOM TOOTH EXTRACTION     lower 2    Current Medications: Current Meds  Medication Sig  . ALPRAZolam (XANAX) 0.25 MG tablet Take 1 tablet by mouth every 4 (four) hours as needed.  Marland Kitchen amLODipine (NORVASC) 5 MG tablet TAKE 1 TABLET BY MOUTH DAILY  . Evolocumab (REPATHA SURECLICK) 140 MG/ML SOAJ Inject 1 pen into the skin every  14 (fourteen) days.  Marland Kitchen. ezetimibe (ZETIA) 10 MG tablet TAKE 1 TABLET BY MOUTH DAILY  . lisinopril (PRINIVIL,ZESTRIL) 20 MG tablet TAKE 1 TABLET BY MOUTH TWICE DAILY  . metoprolol succinate (TOPROL-XL) 25 MG 24 hr tablet TAKE 1 TABLET BY MOUTH EACH MORNING  . omega-3 acid ethyl esters (LOVAZA) 1 g capsule TAKE 1 CAPSULE BY MOUTH TWICE DAILY  . omeprazole (PRILOSEC) 20 MG capsule Take 1 capsule (20 mg total) by mouth daily.  . propafenone (RYTHMOL SR) 325 MG 12 hr capsule TAKE 1 CAPSULE BY MOUTH TWICE DAILY  . SYNTHROID 150 MCG tablet Take 300 mcg by mouth daily.   Carlena Hurl. XARELTO 20 MG TABS tablet TAKE 1 TABLET BY MOUTH DAILY     Allergies:   Crestor [rosuvastatin calcium] and Lipitor [atorvastatin calcium]   Social History   Socioeconomic History  . Marital status: Divorced     Spouse name: Not on file  . Number of children: Not on file  . Years of education: Not on file  . Highest education level: Not on file  Occupational History  . Not on file  Social Needs  . Financial resource strain: Not on file  . Food insecurity:    Worry: Not on file    Inability: Not on file  . Transportation needs:    Medical: Not on file    Non-medical: Not on file  Tobacco Use  . Smoking status: Never Smoker  . Smokeless tobacco: Never Used  Substance and Sexual Activity  . Alcohol use: Yes    Alcohol/week: 4.8 oz    Types: 4 Cans of beer, 4 Shots of liquor per week    Comment: 09/17/2012 "1-2 beers and 1-2 tequila on Fri and Sat nights"  . Drug use: No  . Sexual activity: Yes  Lifestyle  . Physical activity:    Days per week: Not on file    Minutes per session: Not on file  . Stress: Not on file  Relationships  . Social connections:    Talks on phone: Not on file    Gets together: Not on file    Attends religious service: Not on file    Active member of club or organization: Not on file    Attends meetings of clubs or organizations: Not on file    Relationship status: Not on file  Other Topics Concern  . Not on file  Social History Narrative  . Not on file     Family History: The patient's family history includes Cancer in his brother and mother; Heart attack in his maternal grandfather; Hyperlipidemia in his mother; Hypertension in his brother, father, and mother.  ROS:   Please see the history of present illness.    ROS  All other systems reviewed and negative.   EKGs/Labs/Other Studies Reviewed:    The following studies were reviewed today: PAP download  EKG:  EKG is not ordered today.   Recent Labs: 09/13/2016: Hemoglobin 15.3; Platelets 184; TSH 0.590 05/02/2017: ALT 21; BUN 20; Creatinine, Ser 1.55; Potassium 4.1; Sodium 144   Recent Lipid Panel    Component Value Date/Time   CHOL 135 05/02/2017 0807   TRIG 98 05/02/2017 0807   HDL 43  05/02/2017 0807   CHOLHDL 3.1 05/02/2017 0807   CHOLHDL 4.9 12/18/2015 0915   VLDL 13 12/18/2015 0915   LDLCALC 72 05/02/2017 0807    Physical Exam:    VS:  BP 117/75 (BP Location: Left Arm, Patient Position: Sitting, Cuff Size: Large)  Pulse 66   Ht 6\' 3"  (1.905 m)   Wt 245 lb (111.1 kg)   SpO2 95%   BMI 30.62 kg/m     Wt Readings from Last 3 Encounters:  08/03/17 245 lb (111.1 kg)  05/19/17 250 lb (113.4 kg)  02/20/17 267 lb 11.2 oz (121.4 kg)     GEN:  Well nourished, well developed in no acute distress HEENT: Normal NECK: No JVD; No carotid bruits LYMPHATICS: No lymphadenopathy CARDIAC: RRR, no murmurs, rubs, gallops RESPIRATORY:  Clear to auscultation without rales, wheezing or rhonchi  ABDOMEN: Soft, non-tender, non-distended MUSCULOSKELETAL:  No edema; No deformity  SKIN: Warm and dry NEUROLOGIC:  Alert and oriented x 3 PSYCHIATRIC:  Normal affect   ASSESSMENT:    No diagnosis found. PLAN:    In order of problems listed above:  1.  OSA - the patient is tolerating PAP therapy well without any problems. The PAP download was reviewed today and showed an AHI of 0.2/hr on 14 cm H2O with 100% compliance in using more than 4 hours nightly.  The patient has been using and benefiting from PAP use and will continue to benefit from therapy.   2.  HTN -BP is borderline  controlled on exam today.  He will continue on amlodipine 5 mg daily, lisinopril 20 mg twice daily, Toprol-XL 25 mg daily.  3.  Obesity - I have encouraged him to get into a routine exercise program and cut back on carbs and portions.    Medication Adjustments/Labs and Tests Ordered: Current medicines are reviewed at length with the patient today.  Concerns regarding medicines are outlined above.  No orders of the defined types were placed in this encounter.  No orders of the defined types were placed in this encounter.   Signed, Armanda Magic, MD  08/03/2017 3:54 PM    [facility]

## 2017-08-03 NOTE — Patient Instructions (Signed)
Medication Instructions:  Your physician recommends that you continue on your current medications as directed. Please refer to the Current Medication list given to you today.  Labwork: None  Testing/Procedures: None  Follow-Up: Your physician wants you to follow-up in: 1 year with Dr. Turner. You will receive a reminder letter in the mail two months in advance. If you don't receive a letter, please call our office to schedule the follow-up appointment.   If you need a refill on your cardiac medications before your next appointment, please call your pharmacy. ' 

## 2017-08-28 ENCOUNTER — Other Ambulatory Visit: Payer: Self-pay | Admitting: Cardiovascular Disease

## 2017-09-01 ENCOUNTER — Ambulatory Visit: Payer: BLUE CROSS/BLUE SHIELD | Admitting: Family Medicine

## 2017-09-01 DIAGNOSIS — M9903 Segmental and somatic dysfunction of lumbar region: Secondary | ICD-10-CM | POA: Diagnosis not present

## 2017-09-01 DIAGNOSIS — M5386 Other specified dorsopathies, lumbar region: Secondary | ICD-10-CM | POA: Diagnosis not present

## 2017-09-01 DIAGNOSIS — M5136 Other intervertebral disc degeneration, lumbar region: Secondary | ICD-10-CM | POA: Diagnosis not present

## 2017-09-04 DIAGNOSIS — M9903 Segmental and somatic dysfunction of lumbar region: Secondary | ICD-10-CM | POA: Diagnosis not present

## 2017-09-04 DIAGNOSIS — M5136 Other intervertebral disc degeneration, lumbar region: Secondary | ICD-10-CM | POA: Diagnosis not present

## 2017-09-04 DIAGNOSIS — M5386 Other specified dorsopathies, lumbar region: Secondary | ICD-10-CM | POA: Diagnosis not present

## 2017-09-06 DIAGNOSIS — M9903 Segmental and somatic dysfunction of lumbar region: Secondary | ICD-10-CM | POA: Diagnosis not present

## 2017-09-06 DIAGNOSIS — M9904 Segmental and somatic dysfunction of sacral region: Secondary | ICD-10-CM | POA: Diagnosis not present

## 2017-09-06 DIAGNOSIS — M5386 Other specified dorsopathies, lumbar region: Secondary | ICD-10-CM | POA: Diagnosis not present

## 2017-09-11 DIAGNOSIS — M5136 Other intervertebral disc degeneration, lumbar region: Secondary | ICD-10-CM | POA: Diagnosis not present

## 2017-09-11 DIAGNOSIS — M5386 Other specified dorsopathies, lumbar region: Secondary | ICD-10-CM | POA: Diagnosis not present

## 2017-09-11 DIAGNOSIS — M9904 Segmental and somatic dysfunction of sacral region: Secondary | ICD-10-CM | POA: Diagnosis not present

## 2017-09-28 ENCOUNTER — Other Ambulatory Visit: Payer: Self-pay | Admitting: Cardiovascular Disease

## 2017-10-16 ENCOUNTER — Other Ambulatory Visit: Payer: Self-pay | Admitting: Cardiovascular Disease

## 2017-10-16 NOTE — Telephone Encounter (Signed)
Pt last saw Dr Mayford Knife 08/03/17, last labs 05/02/17 Creat 1.55, age 57, weight 111.1kg, CrCl 84.62, based on CrCl pt is on appropriate dosage of Xarelto 20mg  QD.  Will refill rx.

## 2017-11-17 ENCOUNTER — Encounter: Payer: Self-pay | Admitting: Cardiovascular Disease

## 2017-11-17 ENCOUNTER — Ambulatory Visit: Payer: BLUE CROSS/BLUE SHIELD | Admitting: Cardiovascular Disease

## 2017-11-17 VITALS — BP 118/84 | HR 77 | Ht 75.0 in | Wt 252.0 lb

## 2017-11-17 DIAGNOSIS — I480 Paroxysmal atrial fibrillation: Secondary | ICD-10-CM

## 2017-11-17 DIAGNOSIS — E782 Mixed hyperlipidemia: Secondary | ICD-10-CM | POA: Diagnosis not present

## 2017-11-17 LAB — HEPATIC FUNCTION PANEL
ALK PHOS: 80 IU/L (ref 39–117)
ALT: 43 IU/L (ref 0–44)
AST: 34 IU/L (ref 0–40)
Albumin: 4.1 g/dL (ref 3.5–5.5)
BILIRUBIN, DIRECT: 0.17 mg/dL (ref 0.00–0.40)
Bilirubin Total: 0.5 mg/dL (ref 0.0–1.2)
Total Protein: 6.5 g/dL (ref 6.0–8.5)

## 2017-11-17 LAB — BASIC METABOLIC PANEL
BUN / CREAT RATIO: 15 (ref 9–20)
BUN: 21 mg/dL (ref 6–24)
CO2: 22 mmol/L (ref 20–29)
Calcium: 9.4 mg/dL (ref 8.7–10.2)
Chloride: 102 mmol/L (ref 96–106)
Creatinine, Ser: 1.41 mg/dL — ABNORMAL HIGH (ref 0.76–1.27)
GFR calc non Af Amer: 56 mL/min/{1.73_m2} — ABNORMAL LOW (ref >59)
GFR, EST AFRICAN AMERICAN: 64 mL/min/{1.73_m2} (ref >59)
Glucose: 99 mg/dL (ref 65–99)
Potassium: 5 mmol/L (ref 3.5–5.2)
SODIUM: 139 mmol/L (ref 134–144)

## 2017-11-17 LAB — LIPID PANEL
CHOLESTEROL TOTAL: 102 mg/dL (ref 100–199)
Chol/HDL Ratio: 2.2 ratio (ref 0.0–5.0)
HDL: 47 mg/dL (ref >39)
LDL Calculated: 44 mg/dL (ref 0–99)
Triglycerides: 57 mg/dL (ref 0–149)
VLDL Cholesterol Cal: 11 mg/dL (ref 5–40)

## 2017-11-17 NOTE — Progress Notes (Signed)
CARDIOLOGY OFFICE NOTE  Date:  11/17/2017    Isaiah [name], [name] Date of Birth: [birth year] Medical Record #[id number]  PCP:  [name], DO  Cardiologist:  Former patient of Dr. Yevonne Pax.   Now Isaiah [name]  Chief Complaint  Patient presents with  . Atrial Fibrillation   Problem list 1. History of recurrent pulmonary embolus 2. Hypercoagulability - Lupus anticoagulant  3. Hypertension 4. Osteoarthritis-status post hip replacements 5.  Paroxysmal atrial fibrillation 6. 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 Former patient of Dr. Yevonne Pax.   He has a history of prior PE and found to have a hypercoagulability state with abnormal lupus anticoagulant - he is committed to lifelong anticoagulation and is on Xarelto. He has had frequent DVT, PAF, hypothyroidism, HLD and HTN.  He has sleep apnea and is on CPAP - followed by Dr. Mayford Knife.   In January 2014 the patient experienced some chest tightness and underwent a treadmill Myoview stress test which showed no evidence of ischemia and his ejection fraction was 53% with no wall motion abnormalities.   Last seen in November. Cardiac status was stable.   Comes back today. Here alone. He is doing well. He had his hip replacement back in November - did well. Back on track with losing weight - says he is down 20 pounds. BP is lower. No chest pain. Breathing is good. Rhythm is good. Asking about reduction of medicines. His significant other has been diagnosed with breast cancer and he has been helping with her.    June 19, 2015:  Isaiah [name] is seen today for initial visit.   He is a former patient of [facility]. Has hx of PAF - none in the past 6-8 years.    Seems to be triggered by large doses of caffiene.  Has cardioverted on 1 occasion but usually he will convert back into NSR after a day or so .   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   No weight lifting due to a recent hernia repair .   Dec. 1, 2017:  Isaiah [name] is seen today in follow-up for his paroxysmal atrial fib ablation, diastolic dysfunction, and hypertension. No palps  June 21, 2016: Still very active.  Used to lift heavy weight and fight MMA . Does cardio regularly. Does interval  , ellipitical  Dental practice is going well.    No episodes of atrial fib - none in the past 6-8 years  Has hx of DVT ,  He walks frequently when flying   August 04, 2016:  Isaiah [name] is seen today for work in visit. He started having episodes of chest pain last week.  He went on vacation a week ago Ate too much salt, Came home ,  Took Lasix for swelling  Had a non productive cough.   Chest tightness.   Pressure like sensation No pleuretic CP,  Difficult to take a deep breath No fever , dry cough.  Felt a bit better after Z-pac.  But chest pressure persisted.  Feels different from his pulmonary embolus pain   Echo in 2014 showed normal LV function   Sept. 12, 2018:  Isaiah [name] is seen back after having an episode of chest pain  Coronary CT angio shows a prox LAD stenosis of 30%.  Calcium score of 44 ( 72 percentile  for age )  On Zetia,  Had problems with crestor ( elevated liver enzymes)  and Lipitor ( profound muscle aches )  .   Feeling better.  Still working out . No CP   May 19, 2017:   Doing well. Went on [employer] and lost 20 lbs.   Feeling well .  Has not had any recurrent PAF  - last episode was 10 years ago   November 17, 2017:  Isaiah [name] is seen today for follow-up of his paroxysmal atrial fibrillation.  He also has a hypercoagulable state and is on Xarelto. Is a history of very mild coronary artery disease.  Coronary CT angiogram showed a proximal LAD stenosis of approximately 30%.  Coronary calcium score is 44.  He is on Zetia and Repatha. Very busy at work .   Has not been working out as much .  Has OSA - wears his CPAP ,  Seems to be working well    Past Medical History:  Diagnosis Date  . Blood dyscrasia    lupus anticoagulant  . Diastolic dysfunction    Grade I per echo in 2/12  . DVT (deep venous thrombosis) (HCC) 2000's   "both legs in the calves; left went all the way up to my groin" (09/17/2012)  . Dysrhythmia   . GERD (gastroesophageal reflux disease)   . History of bronchitis   . Hypercoagulation syndrome (HCC)    secondary to circulating lupus anticoagulant  . Hyperlipidemia   . Hypertension   . Hypothyroidism    "wiped out from taking amiodarone" (09/17/2012)  . Lyme disease    history of   . Obesity (BMI 30-39.9) 07/17/2014  . PAF (paroxysmal atrial fibrillation) (HCC)    no recurrence since Feb 2012  . PONV (postoperative nausea and vomiting)    likes scopolomanine patch  . Pulmonary embolism (HCC) 09/17/2012   "just today; 3 on the right; 2 on the left" (09/17/2012)  . Situational stress   . Sleep apnea    uses CPAP    Past Surgical History:  Procedure Laterality Date  . CARDIOVASCULAR STRESS TEST  07/17/2009   EF 59%  . CARDIOVERSION    . DISTAL BICEPS TENDON REPAIR Right 2011  . INGUINAL HERNIA REPAIR Right 1992  . INGUINAL HERNIA REPAIR Left 2013  . INSERTION OF MESH N/A 05/14/2015   Procedure: INSERTION OF MESH;  Surgeon: Avel Peace, MD;  Location: [facility];  Service: General;  Laterality: N/A;  . SHOULDER ARTHROSCOPY W/ ROTATOR CUFF REPAIR Left 09/04/2012  . SHOULDER SURGERY     left  . TOTAL HIP ARTHROPLASTY Right 11/26/2014   Procedure: RIGHT TOTAL HIP ARTHROPLASTY ANTERIOR APPROACH;  Surgeon: Ollen Gross, MD;  Location: [facility];  Service: Orthopedics;  Laterality: Right;  . TRANSTHORACIC ECHOCARDIOGRAM  03/05/2010   EF 60-65%  . UMBILICAL HERNIA REPAIR N/A 05/14/2015   Procedure: HERNIA REPAIR UMBILICAL ADULT WITH MESH ;  Surgeon: Avel Peace, MD;  Location: [facility];  Service: General;  Laterality: N/A;  . WISDOM TOOTH EXTRACTION     lower 2     Medications: Current Outpatient Medications    Medication Sig Dispense Refill  . ALPRAZolam (XANAX) 0.25 MG tablet Take 1 tablet by mouth every 4 (four) hours as needed.  4  . amLODipine (NORVASC) 5 MG tablet TAKE 1 TABLET BY MOUTH DAILY 90 tablet 3  . ezetimibe (ZETIA) 10 MG tablet TAKE 1 TABLET BY MOUTH DAILY 90 tablet 1  . lisinopril (PRINIVIL,ZESTRIL) 20 MG tablet  TAKE 1 TABLET BY MOUTH TWICE DAILY 180 tablet 3  . metoprolol succinate (TOPROL-XL) 25 MG 24 hr tablet TAKE 1 TABLET BY MOUTH EACH MORNING 90 tablet 3  . omega-3 acid ethyl esters (LOVAZA) 1 g capsule TAKE 1 CAPSULE BY MOUTH TWICE DAILY 180 capsule 2  . omeprazole (PRILOSEC) 20 MG capsule Take 1 capsule (20 mg total) by mouth daily. 30 capsule 0  . propafenone (RYTHMOL SR) 325 MG 12 hr capsule TAKE 1 CAPSULE BY MOUTH TWICE DAILY 180 capsule 3  . REPATHA SURECLICK 140 MG/ML SOAJ INJECT 1 PEN INTO THE SKIN EVERY 14 DAYS 2 pen 11  . SYNTHROID 150 MCG tablet Take 300 mcg by mouth daily.   0  . XARELTO 20 MG TABS tablet TAKE 1 TABLET BY MOUTH DAILY 90 tablet 1   No current facility-administered medications for this visit.     Allergies: Allergies  Allergen Reactions  . Crestor [Rosuvastatin Calcium] Other (See Comments)    hepatitis  . Lipitor [Atorvastatin Calcium] Other (See Comments)    Feels like someone hit him with 2x4    Social History: The patient  reports that he has never smoked. He has never used smokeless tobacco. He reports that he drinks about 8.0 standard drinks of alcohol per week. He reports that he does not use drugs.   Family History: The patient's family history includes Cancer in his brother and mother; Heart attack in his maternal grandfather; Hyperlipidemia in his mother; Hypertension in his brother, father, and mother.   Review of Systems: Please see the history of present illness.   Otherwise, the review of systems is positive for none.   All other systems are reviewed and negative.   Physical Exam: Blood pressure 118/84, pulse 77, height 6'  3" (1.905 m), weight 252 lb (114.3 kg), SpO2 96 %.  GEN:   Middle age male, NAD  HEENT: Normal NECK: No JVD; No carotid bruits LYMPHATICS: No lymphadenopathy CARDIAC: RR,   RESPIRATORY:  Clear to auscultation without rales, wheezing or rhonchi  ABDOMEN: Soft, non-tender, non-distended MUSCULOSKELETAL:  No edema; No deformity  SKIN: Warm and dry NEUROLOGIC:  Alert and oriented x 3   LABORATORY DATA:  EKG:   May 19, 2017: Normal sinus rhythm at 69.  Normal EKG.  Lab Results  Component Value Date   WBC 4.4 09/13/2016   HGB 15.3 09/13/2016   HCT 46.4 09/13/2016   PLT 184 09/13/2016   GLUCOSE 92 05/02/2017   CHOL 135 05/02/2017   TRIG 98 05/02/2017   HDL 43 05/02/2017   LDLCALC 72 05/02/2017   ALT 21 05/02/2017   AST 30 05/02/2017   NA 144 05/02/2017   K 4.1 05/02/2017   CL 100 05/02/2017   CREATININE 1.55 (H) 05/02/2017   BUN 20 05/02/2017   CO2 26 05/02/2017   TSH 0.590 09/13/2016   PSA 3.69 10/05/2016   INR 1.17 05/13/2015   HGBA1C 5.7 (H) 09/13/2016    BNP (last 3 results) No results for input(s): BNP in the last 8760 hours.  ProBNP (last 3 results) No results for input(s): PROBNP in the last 8760 hours.   Other Studies Reviewed Today:  Echo Study Conclusions from 2014  Left ventricle: The cavity size was normal. Wall thickness was increased in a pattern of mild LVH. Systolic function was normal. The estimated ejection fraction was in the range of 55% to 60%. There was an increased relative contribution of atrial contraction to ventricular filling.   Myoview Impression from  2014 Exercise Capacity: Good exercise capacity. BP Response: Normal blood pressure response. Clinical Symptoms: Mild dyspnea ECG Impression: No significant ST segment change suggestive of ischemia. Comparison with Prior Nuclear Study: No significant change from previous study  Overall Impression: Low risk stress nuclear study. No evidence for ischemia or infarction. EF  mildly decreased.   LV Ejection Fraction: 53%. LV Wall Motion: Mild global hypokinesis.   ECG:      Assessment/Plan:  1. -  Chest tightness:   No significant CAD .   Has a 30% LAD stenosis.   Is on aggressive lipid lowering therapy. Repatha and Zetia   2. Hypercoagulable syndrome :   Continue Xarelto     3. Paroxysmal atrial fibrillation,    On Propafenone.  Has maintained NSR   4. Hypercholesterolemia -  Continue Repatha and Zetia   5. Essential hypertension -  BP is well controlled.   6. Hypothyroidism followed by Dr. Evlyn Kanner  7. Osteoarthritis of right hip.  .   Current medicines are reviewed with the patient today.  The patient does not have concerns regarding medicines other than what has been noted above.  Patient is agreeable to this plan and will call if any problems develop in the interim.   Will see him in 6 months .   Kristeen Miss, MD  11/17/2017 9:10 AM    [facility] [address] [place],  [address] [place], [place]  [address] Pager [phone] Phone: [phone]; Fax: [phone]

## 2017-11-17 NOTE — Patient Instructions (Signed)
Medication Instructions:  Your physician recommends that you continue on your current medications as directed. Please refer to the Current Medication list given to you today.  If you need a refill on your cardiac medications before your next appointment, please call your pharmacy.   Lab work: TODAY - basic metabolic panel, liver panel, cholesterol  If you have labs (blood work) drawn today and your tests are completely normal, you will receive your results only by: Marland Kitchen MyChart Message (if you have MyChart) OR . A paper copy in the mail If you have any lab test that is abnormal or we need to change your treatment, we will call you to review the results.   Testing/Procedures: None Ordered   Follow-Up: At [facility], you and your health needs are our priority.  As part of our continuing mission to provide you with exceptional heart care, we have created designated Provider Care Teams.  These Care Teams include your primary Cardiologist (physician) and Advanced Practice Providers (APPs -  Physician Assistants and Nurse Practitioners) who all work together to provide you with the care you need, when you need it. You will need a follow up appointment in:  6 months.  Please call our office 2 months in advance to schedule this appointment.  You may see Dr. Elease Hashimoto or one of the following Advanced Practice Providers on your designated Care Team: Tereso Newcomer, PA-C [place], [place] . Berton Bon, NP

## 2017-11-21 DIAGNOSIS — E038 Other specified hypothyroidism: Secondary | ICD-10-CM | POA: Diagnosis not present

## 2017-11-21 DIAGNOSIS — N183 Chronic kidney disease, stage 3 (moderate): Secondary | ICD-10-CM | POA: Diagnosis not present

## 2017-11-21 DIAGNOSIS — Z23 Encounter for immunization: Secondary | ICD-10-CM | POA: Diagnosis not present

## 2017-11-21 DIAGNOSIS — R972 Elevated prostate specific antigen [PSA]: Secondary | ICD-10-CM | POA: Diagnosis not present

## 2017-11-21 DIAGNOSIS — D6859 Other primary thrombophilia: Secondary | ICD-10-CM | POA: Diagnosis not present

## 2017-11-21 DIAGNOSIS — I2699 Other pulmonary embolism without acute cor pulmonale: Secondary | ICD-10-CM | POA: Diagnosis not present

## 2017-11-29 DIAGNOSIS — G4733 Obstructive sleep apnea (adult) (pediatric): Secondary | ICD-10-CM | POA: Diagnosis not present

## 2017-12-08 ENCOUNTER — Encounter: Payer: Self-pay | Admitting: Family Medicine

## 2017-12-08 ENCOUNTER — Ambulatory Visit: Payer: BLUE CROSS/BLUE SHIELD | Admitting: Family Medicine

## 2017-12-08 VITALS — BP 119/67 | HR 67 | Temp 98.5°F | Ht 75.0 in | Wt 262.7 lb

## 2017-12-08 DIAGNOSIS — K219 Gastro-esophageal reflux disease without esophagitis: Secondary | ICD-10-CM

## 2017-12-08 DIAGNOSIS — Z86718 Personal history of other venous thrombosis and embolism: Secondary | ICD-10-CM

## 2017-12-08 DIAGNOSIS — I480 Paroxysmal atrial fibrillation: Secondary | ICD-10-CM | POA: Diagnosis not present

## 2017-12-08 DIAGNOSIS — I10 Essential (primary) hypertension: Secondary | ICD-10-CM | POA: Diagnosis not present

## 2017-12-08 DIAGNOSIS — Z79899 Other long term (current) drug therapy: Secondary | ICD-10-CM

## 2017-12-08 DIAGNOSIS — B070 Plantar wart: Secondary | ICD-10-CM

## 2017-12-08 DIAGNOSIS — E785 Hyperlipidemia, unspecified: Secondary | ICD-10-CM

## 2017-12-08 NOTE — Patient Instructions (Addendum)
Please obtain Compound W, 20 % salicylic acid, apply as many times per day as needed, especially when your feet have soaked to soften up.  Then apply disc treatment in addition.   Warts Warts are small growths on the skin. They are common and can occur on various areas of the body. A person may have one wart or multiple warts. Most warts are not painful, and they usually do not cause problems. However, warts can cause pain if they are large or occur in an area of the body where pressure will be applied to them, such as the bottom of the foot. In many cases, warts do not require treatment. They usually go away on their own over a period of many months to a couple years. Various treatments may be done for warts that cause problems or do not go away. Sometimes, warts go away and then come back again. What are the causes? Warts are caused by a type of virus that is called human papillomavirus (HPV). This virus can spread from person to person through direct contact. Warts can also spread to other areas of the body when a person scratches a wart and then scratches another area of his or her body. What increases the risk? Warts are more likely to develop in:  People who are 910-55 years of age.  People who have a weakened body defense system (immune system).  What are the signs or symptoms? A wart may be round or oval or have an irregular shape. Most warts have a rough surface. Warts may range in color from skin color to light yellow, brown, or gray. They are generally less than  inch (1.3 cm) in size. Most warts are painless, but some can be painful when pressure is applied to them. How is this diagnosed? A wart can usually be diagnosed from its appearance. In some cases, a tissue sample may be removed (biopsy) to be looked at under a microscope. How is this treated? In many cases, warts do not need treatment. If treatment is needed, options may include:  Applying medicated solutions, creams, or  patches to the wart. These may be over-the-counter or prescription medicines that make the skin soft so that layers will gradually shed away. In many cases, the medicine is applied one or two times per day and covered with a bandage.  Putting duct tape over the top of the wart (occlusion). You will leave the tape in place for as long as told by your health care provider, then you will replace it with a new strip of tape. This is done until the wart goes away.  Freezing the wart with liquid nitrogen (cryotherapy).  Burning the wart with: ? Laser treatment. ? An electrified probe (electrocautery).  Injection of a medicine (Candida antigen) into the wart to help the body's immune system to fight off the wart.  Surgery to remove the wart.  Follow these instructions at home:  Apply over-the-counter and prescription medicines only as told by your health care provider.  Do not apply over-the-counter wart medicines to your face or genitals before you ask your health care provider if it is okay to do so.  Do not scratch or pick at a wart.  Wash your hands after you touch a wart.  Avoid shaving hair that is over a wart.  Keep all follow-up visits as told by your health care provider. This is important. Contact a health care provider if:  Your warts do not improve after treatment.  You have redness, swelling, or pain at the site of a wart.  You have bleeding from a wart that does not stop with light pressure.  You have diabetes and you develop a wart. This information is not intended to replace advice given to you by your health care provider. Make sure you discuss any questions you have with your health care provider. Document Released: 10/13/2004 Document Revised: 06/17/2015 Document Reviewed: 03/31/2014 Elsevier Interactive Patient Education  [employer].

## 2017-12-08 NOTE — Progress Notes (Signed)
Impression and Recommendations:    1. High risk medication use   2. PAF (paroxysmal atrial fibrillation) (HCC)   3. Dyslipidemia   4. Essential hypertension   5. Personal history of multiple DVTs (deep vein thrombosis)   6. Verruca pedis   7. Gastroesophageal reflux disease, esophagitis presence not specified      Indication: Plantar Wart Medical necessity statement:  On physical examination, verruca noted on bottom of left foot, just proximal to the proximal fifth metatarsal.   Consent:  Discussed benefits and risks of procedure and verbal consent obtained from the patient.  Procedure:  Patient was prepped in clean fashion for the procedure.   Hyperkeratotic skin was shaved off from the surface of the verruca - down to the base were it just started to bleed.   Blood Loss:  less than 1 cc.  Post-Care Instructions:   Proper wound care discussed with patient.  If wound does not continue improve as expected, patient told to notify us immediately  1. Cholesterol -  Recent Lipid Panel: On 11/17/2017 was reviewed with patient. - LDL = down to 44 from 131.  Fantastic.  New med Repatha started by cardiology seems to be working very well for patient. - HDL = 47. - Patient managed on Repatha and Zetia, and continues on Lovaza- Managed by Cardiology - Treatment plan managed by Dr. Elease Hashimoto of Cardiology.  A-fib:  -Asymptomatic.  No complaints - Xarelto and Rythmol also managed by Dr. Elease Hashimoto of Cardiology   HTN:  -Well-controlled on current medications.  Elevated serum creatinine 3 weeks ago at 1.41 -  -Serum creatinine elevated at 1.41.  Told patient to avoid use of Advil or Aleve which he has been using occasionally for generalized "old age aches and pains".  Advised patient to limit them  2. Thyroid Management: - Dr. Evlyn Kanner of endocrinology will continue to manage patient's thyroid. - Continues on synthroid; taking 300 mcg per day.  See med list.  3. Mood: - Continues to  receive Xanax from Dr. Evlyn Kanner.  See med list.  - Reviewed the "spokes of the wheel" of mood and health management.  Stressed the importance of ongoing prudent habits, including regular exercise, appropriate sleep hygiene, healthful dietary habits, and prayer/meditation to relax.  4. Abdominal Concerns - Advised patient that he should attempt to avoid surgical intervention if at all possible.  5. BMI Counseling: Explained to patient what BMI refers to, and what it means medically.    Told patient to think about it as a "medical risk stratification measurement" and how increasing BMI is associated with increasing risk/ or worsening state of various diseases such as hypertension, hyperlipidemia, diabetes, premature OA, depression etc.  American Heart Association guidelines for healthy diet, basically Mediterranean diet, and exercise guidelines of 30 minutes 5 days per week or more discussed in detail.  Health counseling performed.  All questions answered.  6. Lifestyle & Preventative Health Maintenance: - Advised patient to continue working toward exercising to improve overall mental, physical, and emotional health.    - Encouraged patient to engage in daily physical activity, especially a formal exercise routine.  Recommended that the patient eventually strive for at least 150 minutes of moderate cardiovascular activity per week according to guidelines established by the [facility].   - Healthy dietary habits encouraged, including low-carb, and high amounts of lean protein in diet.   - Patient should also consume adequate amounts of water.  7. Follow-Up: - Prescriptions refilled today. - Re-check  fasting lab work as recommended. - Otherwise, continue to return for CPE and chronic follow-up as scheduled.   - Patient knows to call in sooner if desired to address acute concerns.   Gross side effects, risk and benefits, and alternatives of medications and treatment plan in general discussed with  patient.  Patient is aware that all medications have potential side effects and we are unable to predict every side effect or drug-drug interaction that may occur.   Patient will call with any questions prior to using medication if they have concerns.    Expresses verbal understanding and consents to current therapy and treatment regimen.  No barriers to understanding were identified.  Red flag symptoms and signs discussed in detail.  Patient expressed understanding regarding what to do in case of emergency\urgent symptoms  Please see AVS handed out to patient at the end of our visit for further patient instructions/ counseling done pertaining to today's office visit.   Return for 75mo or so.     Note:  This note was prepared with assistance of Dragon voice recognition software. Occasional wrong-word or sound-a-like substitutions may have occurred due to the inherent limitations of voice recognition software.   This document serves as a record of services personally performed by [name], DO. It was created on her behalf by [name], a trained medical scribe. The creation of this record is based on the scribe's personal observations and the provider's statements to them.   I have reviewed the above medical documentation for accuracy and completeness and I concur.  [name], DO 12/11/2017 8:28 AM       --------------------------------------------------------------------------------------------------   Subjective:     HPI: Isaiah [name], Isaiah [name] is a 56 y.o. male who presents to [facility] at [facility] today for issues as discussed below.  Endocrinology Management Dr. Evlyn Kanner of endocrinology manages patient's thyroid.  Still managed on synthroid; taking 300 mcg per day.  Cardiology - Cholesterol Management He continues to follow up with Dr. Elease Hashimoto of Cardiology.  Patient was told by his doctor that his current management on cholesterol is a  "miracle drug for him."  He is very compliant with treatment plan.  Mood Patient continues to use Xanax  States he is very tired.  Remarks he's trying to find an associate to come in and work with him because "they're working me to death over there."  However, feels that he cannot stop working at the rate he is working, because he has so many things to take care of.  Exercise Habits Patient is not exercising.  States that he hurts so bad with generalized aches and pains, that this prevents him from exercising.  He completely quit taking ibuprofen; hasn't taken it for 5-6 months.  Personal Life Patient is struggling emotionally at times because he has not been able to easily see or talk to his daughters.  He describes a moment recently where he was attempting to get in touch with his daughters, and expresses that this is a struggle for him.  He continues to date his girlfriend.   Wt Readings from Last 3 Encounters:  12/08/17 262 lb 11.2 oz (119.2 kg)  11/17/17 252 lb (114.3 kg)  08/03/17 245 lb (111.1 kg)   BP Readings from Last 3 Encounters:  12/08/17 119/67  11/17/17 118/84  08/03/17 117/75   Pulse Readings from Last 3 Encounters:  12/08/17 67  11/17/17 77  08/03/17 66   BMI Readings from Last 3 Encounters:  12/08/17 32.84 kg/m  11/17/17 31.50 kg/m  08/03/17 30.62 kg/m     Patient Care Team    Relationship Specialty Notifications Start End  Thomasene Lotpalski, Tongela Encinas, DO PCP - General Family Medicine  08/26/15   [name], Deloris PingPhilip J, MD PCP - Cardiology Cardiology Admissions 11/17/17   Arminda ResidesJones, Daniel, MD Consulting Physician Dermatology  10/01/15   Quintella Reicherturner, Traci R, MD Consulting Physician Cardiology  10/01/15    Comment: for OSA/  Sleep Med  Adrian PrinceSouth, Stephen, MD Consulting Physician Endocrinology  05/16/16    Comment: sees him for hypothyroidism  Jethro Bolusannenbaum, Sigmund, MD Consulting Physician Urology  05/16/16      Patient Active Problem List   Diagnosis Date Noted  .  Hypercoagulopathy (HCC) 08/05/2016    Priority: High  . Chronic anticoagulation- abnormal lupus anticoagulant 08/27/2015    Priority: High  . Personal history of multiple DVTs (deep vein thrombosis) 08/27/2015    Priority: High  . Other reactions to severe stress 08/27/2015    Priority: High  . CPAP (continuous positive airway pressure) dependence 08/27/2015    Priority: High  . OSA (obstructive sleep apnea) 05/02/2014    Priority: High  . Dyslipidemia 05/10/2013    Priority: High  . HTN (hypertension) 10/01/2010    Priority: High  . PAF (paroxysmal atrial fibrillation) (HCC) 10/01/2010    Priority: High  . Osteoarthritis of multiple joints 08/27/2015    Priority: Medium  . h/o Lyme disease- 2014 tick bite 08/27/2015    Priority: Medium  . Memory changes- since Lyme Dx 08/27/2015    Priority: Medium  . History of chronic Myalgias since Lyme Dx 2014 05/10/2013    Priority: Medium  . h/o Pulmonary embolism and infarction (HCC) 09/17/2012    Priority: Medium  . Sessile colonic polyp- descending colon 09/28/2016    Priority: Low  . Seborrheic dermatitis of scalp 10/01/2015    Priority: Low  . GERD (gastroesophageal reflux disease) 08/27/2015    Priority: Low  . Hypothyroidism- followed by Dr. Adrian PrinceStephen South of Endo 12/17/2010    Priority: Low  . High risk medication use 10/01/2010    Priority: Low  . Verruca pedis 12/11/2017  . Essential hypertension 12/11/2017  . Gastroesophageal reflux disease 12/11/2017  . Fever 02/20/2017  . Obesity (BMI 30-39.9) 07/17/2014  . Cough 09/28/2012  . Chest pain 01/27/2012    Past Medical history, Surgical history, Family history, Social history, Allergies and Medications have been entered into the medical record, reviewed and changed as needed.    Current Meds  Medication Sig  . ALPRAZolam (XANAX) 0.25 MG tablet Take 1 tablet by mouth every 4 (four) hours as needed.  Marland Kitchen. amLODipine (NORVASC) 5 MG tablet TAKE 1 TABLET BY MOUTH DAILY  .  ezetimibe (ZETIA) 10 MG tablet TAKE 1 TABLET BY MOUTH DAILY  . lisinopril (PRINIVIL,ZESTRIL) 20 MG tablet TAKE 1 TABLET BY MOUTH TWICE DAILY  . metoprolol succinate (TOPROL-XL) 25 MG 24 hr tablet TAKE 1 TABLET BY MOUTH EACH MORNING  . omega-3 acid ethyl esters (LOVAZA) 1 g capsule TAKE 1 CAPSULE BY MOUTH TWICE DAILY  . omeprazole (PRILOSEC) 20 MG capsule Take 1 capsule (20 mg total) by mouth daily.  . propafenone (RYTHMOL SR) 325 MG 12 hr capsule TAKE 1 CAPSULE BY MOUTH TWICE DAILY  . REPATHA SURECLICK 140 MG/ML SOAJ INJECT 1 PEN INTO THE SKIN EVERY 14 DAYS  . SYNTHROID 150 MCG tablet Take 300 mcg by mouth daily.   Carlena Hurl. XARELTO 20 MG TABS tablet TAKE 1 TABLET BY  MOUTH DAILY    Allergies:  Allergies  Allergen Reactions  . Crestor [Rosuvastatin Calcium] Other (See Comments)    hepatitis  . Lipitor [Atorvastatin Calcium] Other (See Comments)    Feels like someone hit him with 2x4     Review of Systems:  A fourteen system review of systems was performed and found to be positive as per HPI.   Objective:   Blood pressure 119/67, pulse 67, temperature 98.5 F (36.9 C), height 6\' 3"  (1.905 m), weight 262 lb 11.2 oz (119.2 kg), SpO2 97 %. Body mass index is 32.84 kg/m. General:  Well Developed, well nourished, appropriate for stated age.  Neuro:  Alert and oriented,  extra-ocular muscles intact  HEENT:  Normocephalic, atraumatic, neck supple, no carotid bruits appreciated  Skin: Plantar wart bottom of left foot, just proximal to the proximal fifth metatarsal.  No gross rash, warm, pink. Cardiac:  RRR, S1 S2 Respiratory:  ECTA B/L and A/P, Not using accessory muscles, speaking in full sentences- unlabored. Vascular:  Ext warm, no cyanosis apprec.; cap RF less 2 sec. Psych:  No HI/SI, judgement and insight good, Euthymic mood. Full Affect. Abdominal: Hernia that extends superiorly from umbilicus, approximately 10 cm in length along his linea alba

## 2017-12-11 DIAGNOSIS — B070 Plantar wart: Secondary | ICD-10-CM | POA: Insufficient documentation

## 2017-12-11 DIAGNOSIS — I10 Essential (primary) hypertension: Secondary | ICD-10-CM | POA: Insufficient documentation

## 2017-12-11 DIAGNOSIS — K219 Gastro-esophageal reflux disease without esophagitis: Secondary | ICD-10-CM | POA: Insufficient documentation

## 2018-01-02 DIAGNOSIS — M5136 Other intervertebral disc degeneration, lumbar region: Secondary | ICD-10-CM | POA: Diagnosis not present

## 2018-01-02 DIAGNOSIS — M5386 Other specified dorsopathies, lumbar region: Secondary | ICD-10-CM | POA: Diagnosis not present

## 2018-01-02 DIAGNOSIS — M9904 Segmental and somatic dysfunction of sacral region: Secondary | ICD-10-CM | POA: Diagnosis not present

## 2018-01-02 DIAGNOSIS — M9903 Segmental and somatic dysfunction of lumbar region: Secondary | ICD-10-CM | POA: Diagnosis not present

## 2018-01-04 DIAGNOSIS — N401 Enlarged prostate with lower urinary tract symptoms: Secondary | ICD-10-CM | POA: Diagnosis not present

## 2018-01-04 DIAGNOSIS — Z125 Encounter for screening for malignant neoplasm of prostate: Secondary | ICD-10-CM | POA: Diagnosis not present

## 2018-01-04 DIAGNOSIS — R351 Nocturia: Secondary | ICD-10-CM | POA: Diagnosis not present

## 2018-01-06 DIAGNOSIS — M9903 Segmental and somatic dysfunction of lumbar region: Secondary | ICD-10-CM | POA: Diagnosis not present

## 2018-01-06 DIAGNOSIS — M9904 Segmental and somatic dysfunction of sacral region: Secondary | ICD-10-CM | POA: Diagnosis not present

## 2018-01-06 DIAGNOSIS — M5386 Other specified dorsopathies, lumbar region: Secondary | ICD-10-CM | POA: Diagnosis not present

## 2018-01-06 DIAGNOSIS — M5136 Other intervertebral disc degeneration, lumbar region: Secondary | ICD-10-CM | POA: Diagnosis not present

## 2018-01-12 DIAGNOSIS — M9903 Segmental and somatic dysfunction of lumbar region: Secondary | ICD-10-CM | POA: Diagnosis not present

## 2018-01-12 DIAGNOSIS — M5136 Other intervertebral disc degeneration, lumbar region: Secondary | ICD-10-CM | POA: Diagnosis not present

## 2018-01-12 DIAGNOSIS — M9904 Segmental and somatic dysfunction of sacral region: Secondary | ICD-10-CM | POA: Diagnosis not present

## 2018-01-12 DIAGNOSIS — M5386 Other specified dorsopathies, lumbar region: Secondary | ICD-10-CM | POA: Diagnosis not present

## 2018-01-16 DIAGNOSIS — M9903 Segmental and somatic dysfunction of lumbar region: Secondary | ICD-10-CM | POA: Diagnosis not present

## 2018-01-16 DIAGNOSIS — M9904 Segmental and somatic dysfunction of sacral region: Secondary | ICD-10-CM | POA: Diagnosis not present

## 2018-01-16 DIAGNOSIS — M5386 Other specified dorsopathies, lumbar region: Secondary | ICD-10-CM | POA: Diagnosis not present

## 2018-01-16 DIAGNOSIS — M5136 Other intervertebral disc degeneration, lumbar region: Secondary | ICD-10-CM | POA: Diagnosis not present

## 2018-01-20 DIAGNOSIS — M9903 Segmental and somatic dysfunction of lumbar region: Secondary | ICD-10-CM | POA: Diagnosis not present

## 2018-01-20 DIAGNOSIS — M5136 Other intervertebral disc degeneration, lumbar region: Secondary | ICD-10-CM | POA: Diagnosis not present

## 2018-01-20 DIAGNOSIS — M5386 Other specified dorsopathies, lumbar region: Secondary | ICD-10-CM | POA: Diagnosis not present

## 2018-01-20 DIAGNOSIS — M9904 Segmental and somatic dysfunction of sacral region: Secondary | ICD-10-CM | POA: Diagnosis not present

## 2018-01-24 ENCOUNTER — Other Ambulatory Visit: Payer: Self-pay | Admitting: Family Medicine

## 2018-03-05 DIAGNOSIS — G4733 Obstructive sleep apnea (adult) (pediatric): Secondary | ICD-10-CM | POA: Diagnosis not present

## 2018-03-28 DIAGNOSIS — I2699 Other pulmonary embolism without acute cor pulmonale: Secondary | ICD-10-CM | POA: Diagnosis not present

## 2018-03-28 DIAGNOSIS — E038 Other specified hypothyroidism: Secondary | ICD-10-CM | POA: Diagnosis not present

## 2018-03-28 DIAGNOSIS — E7849 Other hyperlipidemia: Secondary | ICD-10-CM | POA: Diagnosis not present

## 2018-03-28 DIAGNOSIS — D126 Benign neoplasm of colon, unspecified: Secondary | ICD-10-CM | POA: Diagnosis not present

## 2018-03-30 ENCOUNTER — Other Ambulatory Visit: Payer: Self-pay | Admitting: Cardiovascular Disease

## 2018-03-30 NOTE — Telephone Encounter (Signed)
crcl 34ml/min, last ov 11/17/17

## 2018-05-11 DIAGNOSIS — M5136 Other intervertebral disc degeneration, lumbar region: Secondary | ICD-10-CM | POA: Diagnosis not present

## 2018-05-11 DIAGNOSIS — M9904 Segmental and somatic dysfunction of sacral region: Secondary | ICD-10-CM | POA: Diagnosis not present

## 2018-05-11 DIAGNOSIS — M5386 Other specified dorsopathies, lumbar region: Secondary | ICD-10-CM | POA: Diagnosis not present

## 2018-05-11 DIAGNOSIS — M9903 Segmental and somatic dysfunction of lumbar region: Secondary | ICD-10-CM | POA: Diagnosis not present

## 2018-05-14 DIAGNOSIS — M9903 Segmental and somatic dysfunction of lumbar region: Secondary | ICD-10-CM | POA: Diagnosis not present

## 2018-05-14 DIAGNOSIS — M9904 Segmental and somatic dysfunction of sacral region: Secondary | ICD-10-CM | POA: Diagnosis not present

## 2018-05-14 DIAGNOSIS — M5136 Other intervertebral disc degeneration, lumbar region: Secondary | ICD-10-CM | POA: Diagnosis not present

## 2018-05-14 DIAGNOSIS — M5386 Other specified dorsopathies, lumbar region: Secondary | ICD-10-CM | POA: Diagnosis not present

## 2018-05-22 ENCOUNTER — Ambulatory Visit: Payer: BLUE CROSS/BLUE SHIELD | Admitting: Family Medicine

## 2018-05-23 ENCOUNTER — Telehealth: Payer: Self-pay

## 2018-05-23 ENCOUNTER — Other Ambulatory Visit: Payer: Self-pay | Admitting: Family Medicine

## 2018-05-23 NOTE — Telephone Encounter (Signed)
Isaiah [name] at Dr. Sharol [name] called and requested a call from Dr. Sharee [name] - stated that he has a couple of questions for her but would not ask me or inform me what it was in regards to.  Call back number is [phone] (their office takes lunch from 1-2) MPulliam, CMA/RT(R)

## 2018-05-24 NOTE — Telephone Encounter (Signed)
Called number listed and left message on voicemail that I called back.  VM said they are "closed" due to covid, thus not sure anyone will get that message.  If they call back- take message and I will try again tomorrow or later on today. THNX

## 2018-05-28 NOTE — Telephone Encounter (Signed)
Noted MPulliam, CMA/RT(R)  

## 2018-05-30 ENCOUNTER — Ambulatory Visit: Payer: BLUE CROSS/BLUE SHIELD | Admitting: Adult Health

## 2018-06-08 ENCOUNTER — Telehealth: Payer: Self-pay

## 2018-06-08 NOTE — Telephone Encounter (Signed)
Obtained verbal consent from pt for video visit. Pt has been informed to have vitals ready for the appt.   YOUR CARDIOLOGY TEAM HAS ARRANGED FOR AN E-VISIT FOR YOUR APPOINTMENT - PLEASE REVIEW IMPORTANT INFORMATION BELOW SEVERAL DAYS PRIOR TO YOUR APPOINTMENT  Due to the recent COVID-19 pandemic, we are transitioning in-person office visits to tele-medicine visits in an effort to decrease unnecessary exposure to our patients, their families, and staff. These visits are billed to your insurance just like a normal visit is. We also encourage you to sign up for MyChart if you have not already done so. You will need a smartphone if possible. For patients that do not have this, we can still complete the visit using a regular telephone but do prefer a smartphone to enable video when possible. You may have a family member that lives with you that can help. If possible, we also ask that you have a blood pressure cuff and scale at home to measure your blood pressure, heart rate and weight prior to your scheduled appointment. Patients with clinical needs that need an in-person evaluation and testing will still be able to come to the office if absolutely necessary. If you have any questions, feel free to call our office.     YOUR PROVIDER WILL BE USING THE FOLLOWING PLATFORM TO COMPLETE YOUR VISIT: [email or web address] . IF USING MYCHART - How to Download the MyChart App to Your SmartPhone   - If Apple, go to [employer] and type in MyChart in the search bar and download the app. If Android, ask patient to go to [employer] and type in [place] in the search bar and download the app. The app is free but as with any other app downloads, your phone may require you to verify saved payment information or Apple/Android password.  - You will need to then log into the app with your MyChart username and password, and select [facility] as your healthcare provider to link the account.  - When it is time for your visit, go to the  MyChart app, find appointments, and click Begin Video Visit. Be sure to Select Allow for your device to access the Microphone and Camera for your visit. You will then be connected, and your provider will be with you shortly.  **If you have any issues connecting or need assistance, please contact MyChart service desk [phone])**  **If using a computer, in order to ensure the best quality for your visit, you will need to use either of the following Internet Browsers: [employer] or [employer]**  . IF USING DOXIMITY or [email or web address] - The staff will give you instructions on receiving your link to join the meeting the day of your visit.      2-3 DAYS BEFORE YOUR APPOINTMENT  You will receive a telephone call from one of our [facility] team members - your caller ID may say "Unknown caller." If this is a video visit, we will walk you through how to get the video launched on your phone. We will remind you check your blood pressure, heart rate and weight prior to your scheduled appointment. If you have an Apple Watch or Kardia, please upload any pertinent ECG strips the day before or morning of your appointment to MyChart. Our staff will also make sure you have reviewed the consent and agree to move forward with your scheduled tele-health visit.     THE DAY OF YOUR APPOINTMENT  Approximately 15 minutes prior to your scheduled appointment,  you will receive a telephone call from one of [facility] team - your caller ID may say "Unknown caller."  Our staff will confirm medications, vital signs for the day and any symptoms you may be experiencing. Please have this information available prior to the time of visit start. It may also be helpful for you to have a pad of paper and pen handy for any instructions given during your visit. They will also walk you through joining the smartphone meeting if this is a video visit.    CONSENT FOR TELE-HEALTH VISIT - PLEASE REVIEW  I hereby voluntarily  request, consent and authorize [facility] and its employed or contracted physicians, physician assistants, nurse practitioners or other licensed health care professionals (the Practitioner), to provide me with telemedicine health care services (the "Services") as deemed necessary by the treating Practitioner. I acknowledge and consent to receive the Services by the Practitioner via telemedicine. I understand that the telemedicine visit will involve communicating with the Practitioner through live audiovisual communication technology and the disclosure of certain medical information by electronic transmission. I acknowledge that I have been given the opportunity to request an in-person assessment or other available alternative prior to the telemedicine visit and am voluntarily participating in the telemedicine visit.  I understand that I have the right to withhold or withdraw my consent to the use of telemedicine in the course of my care at any time, without affecting my right to future care or treatment, and that the Practitioner or I may terminate the telemedicine visit at any time. I understand that I have the right to inspect all information obtained and/or recorded in the course of the telemedicine visit and may receive copies of available information for a reasonable fee.  I understand that some of the potential risks of receiving the Services via telemedicine include:  Marland Kitchen Delay or interruption in medical evaluation due to technological equipment failure or disruption; . Information transmitted may not be sufficient (e.g. poor resolution of images) to allow for appropriate medical decision making by the Practitioner; and/or  . In rare instances, security protocols could fail, causing a breach of personal health information.  Furthermore, I acknowledge that it is my responsibility to provide information about my medical history, conditions and care that is complete and accurate to the best of my  ability. I acknowledge that Practitioner's advice, recommendations, and/or decision may be based on factors not within their control, such as incomplete or inaccurate data provided by me or distortions of diagnostic images or specimens that may result from electronic transmissions. I understand that the practice of medicine is not an exact science and that Practitioner makes no warranties or guarantees regarding treatment outcomes. I acknowledge that I will receive a copy of this consent concurrently upon execution via email to the email address I last provided but may also request a printed copy by calling the office of [place].    I understand that my insurance will be billed for this visit.   I have read or had this consent read to me. . I understand the contents of this consent, which adequately explains the benefits and risks of the Services being provided via telemedicine.  . I have been provided ample opportunity to ask questions regarding this consent and the Services and have had my questions answered to my satisfaction. . I give my informed consent for the services to be provided through the use of telemedicine in my medical care  By participating in this telemedicine visit  I agree to the above.

## 2018-06-15 ENCOUNTER — Telehealth (INDEPENDENT_AMBULATORY_CARE_PROVIDER_SITE_OTHER): Payer: BLUE CROSS/BLUE SHIELD | Admitting: Cardiovascular Disease

## 2018-06-15 ENCOUNTER — Encounter: Payer: Self-pay | Admitting: Family Medicine

## 2018-06-15 ENCOUNTER — Other Ambulatory Visit: Payer: Self-pay

## 2018-06-15 ENCOUNTER — Encounter: Payer: Self-pay | Admitting: Cardiovascular Disease

## 2018-06-15 VITALS — BP 138/84 | HR 62 | Ht 75.0 in | Wt 262.0 lb

## 2018-06-15 DIAGNOSIS — Z7901 Long term (current) use of anticoagulants: Secondary | ICD-10-CM

## 2018-06-15 DIAGNOSIS — E785 Hyperlipidemia, unspecified: Secondary | ICD-10-CM | POA: Diagnosis not present

## 2018-06-15 DIAGNOSIS — Z7189 Other specified counseling: Secondary | ICD-10-CM

## 2018-06-15 DIAGNOSIS — I480 Paroxysmal atrial fibrillation: Secondary | ICD-10-CM | POA: Diagnosis not present

## 2018-06-15 NOTE — Patient Instructions (Addendum)
Medication Instructions:  Your physician recommends that you continue on your current medications as directed. Please refer to the Current Medication list given to you today.  If you need a refill on your cardiac medications before your next appointment, please call your pharmacy.    Lab work: Your physician recommends that you return for lab work on the day of your office visit with Dr. Elease Hashimoto.  You will need to FAST for this appointment - nothing to eat or drink after midnight the night before except water.    Testing/Procedures: None Ordered    Follow-Up: Your physician recommends that you return for a follow-up appointment on Thursday September 17 at 8:00 am with Dr. Elease Hashimoto

## 2018-06-15 NOTE — Progress Notes (Signed)
Virtual Visit via Telephone Note   This visit type was conducted due to national recommendations for restrictions regarding the COVID-19 Pandemic (e.g. social distancing) in an effort to limit this patient's exposure and mitigate transmission in our community.  Due to his co-morbid illnesses, this patient is at least at moderate risk for complications without adequate follow up.  This format is felt to be most appropriate for this patient at this time.  The patient did not have access to video technology/had technical difficulties with video requiring transitioning to audio format only (telephone).  All issues noted in this document were discussed and addressed.  No physical exam could be performed with this format.  Please refer to the patient's chart for his  consent to telehealth for [facility].   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 History of recurrent pulmonary embolus 2. Hypercoagulability - Lupus anticoagulant  3. Hypertension 4. Osteoarthritis-status post hip replacements 5.  Paroxysmal atrial fibrillation 6. 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 Former patient of Dr. Yevonne PaxBrackbill's.   He has a history of prior PE and found to have a hypercoagulability state with abnormal lupus anticoagulant - he is committed to lifelong anticoagulation and is on Xarelto. He has had frequent DVT, PAF, hypothyroidism, HLD and HTN.  He has sleep apnea and is on CPAP - followed by Dr. Mayford Knifeurner.   In January 2014 the patient experienced some chest tightness and underwent a treadmill Myoview stress test which showed no evidence of ischemia and his ejection fraction was 53% with no wall motion abnormalities.   Last seen in November. Cardiac  status was stable.   Comes back today. Here alone. He is doing well. He had his hip replacement back in November - did well. Back on track with losing weight - says he is down 20 pounds. BP is lower. No chest pain. Breathing is good. Rhythm is good. Asking about reduction of medicines. His significant other has been diagnosed with breast cancer and he has been helping with her.    June 19, 2015:  Isaiah [name] is seen today for initial visit.   He is a former patient of [facility]. Has hx of PAF - none in the past 6-8 years.    Seems to be triggered by large doses of caffiene.  Has cardioverted on 1 occasion but usually he will convert back into NSR after a day or so .   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   No weight lifting due to a recent hernia repair .   Dec. 1, 2017:  Isaiah [name] is seen today in follow-up for his paroxysmal atrial fib ablation, diastolic dysfunction, and hypertension. No palps  June 21, 2016: Still very active.  Used to lift heavy weight and fight MMA . Does cardio regularly. Does interval  , ellipitical  Dental practice is going well.    No episodes of atrial fib - none in the past 6-8 years  Has hx of DVT ,  He walks frequently when flying   August 04, 2016:  Isaiah [name] is seen today for work in visit. He started having episodes  of chest pain last week.  He went on vacation a week ago Ate too much salt, Came home ,  Took Lasix for swelling  Had a non productive cough.   Chest tightness.   Pressure like sensation No pleuretic CP,  Difficult to take a deep breath No fever , dry cough.  Felt a bit better after Z-pac.  But chest pressure persisted.  Feels different from his pulmonary embolus pain   Echo in 2014 showed normal LV function   Sept. 12, 2018:  Isaiah [name] is seen back after having an episode of chest pain  Coronary CT angio shows a prox LAD stenosis of 30%.  Calcium score of 44 ( 72  percentile for age )  On Zetia,  Had problems with crestor ( elevated liver enzymes)  and Lipitor ( profound muscle aches )  .   Feeling better.  Still working out . No CP   May 19, 2017:   Doing well. Went on [employer] and lost 20 lbs.   Feeling well .  Has not had any recurrent PAF  - last episode was 10 years ago   November 17, 2017:  Isaiah [name] is seen today for follow-up of his paroxysmal atrial fibrillation.  He also has a hypercoagulable state and is on Xarelto. Is a history of very mild coronary artery disease.  Coronary CT angiogram showed a proximal LAD stenosis of approximately 30%.  Coronary calcium score is 44.  He is on Zetia and Repatha. Very busy at work .   Has not been working out as much .  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 Has a coronary calcium score of 44.   Is on Repatha  Labs from Nov, 2019 look great   No recent episodes of AF    The patient does not have symptoms concerning for COVID-19 infection (fever, chills, cough, or new shortness of breath).    Past Medical History:  Diagnosis Date  . Blood dyscrasia    lupus anticoagulant  . Diastolic dysfunction    Grade I per echo in 2/12  . DVT (deep venous thrombosis) (HCC) 2000's   "both legs in the calves; left went all the way up to my groin" (09/17/2012)  . Dysrhythmia   . GERD (gastroesophageal reflux disease)   . History of bronchitis   . Hypercoagulation syndrome (HCC)    secondary to circulating lupus anticoagulant  . Hyperlipidemia   . Hypertension   . Hypothyroidism    "wiped out from taking amiodarone" (09/17/2012)  . Lyme disease    history of   . Obesity (BMI 30-39.9) 07/17/2014  . PAF (paroxysmal atrial fibrillation) (HCC)    no recurrence since Feb 2012  . PONV (postoperative nausea and  vomiting)    likes scopolomanine patch  . Pulmonary embolism (HCC) 09/17/2012   "just today; 3 on the right; 2 on the left" (09/17/2012)  . Situational stress   . Sleep apnea    uses CPAP   Past Surgical History:  Procedure Laterality Date  . CARDIOVASCULAR STRESS TEST  07/17/2009   EF 59%  . CARDIOVERSION    . DISTAL BICEPS TENDON REPAIR Right 2011  . INGUINAL HERNIA REPAIR Right 1992  . INGUINAL HERNIA REPAIR Left  2013  . INSERTION OF MESH N/A 05/14/2015   Procedure: INSERTION OF MESH;  Surgeon: Avel Peace, MD;  Location: [facility];  Service: General;  Laterality: N/A;  . SHOULDER ARTHROSCOPY W/ ROTATOR CUFF REPAIR Left 09/04/2012  . SHOULDER SURGERY     left  . TOTAL HIP ARTHROPLASTY Right 11/26/2014   Procedure: RIGHT TOTAL HIP ARTHROPLASTY ANTERIOR APPROACH;  Surgeon: Ollen Gross, MD;  Location: [facility];  Service: Orthopedics;  Laterality: Right;  . TRANSTHORACIC ECHOCARDIOGRAM  03/05/2010   EF 60-65%  . UMBILICAL HERNIA REPAIR N/A 05/14/2015   Procedure: HERNIA REPAIR UMBILICAL ADULT WITH MESH ;  Surgeon: Avel Peace, MD;  Location: [facility];  Service: General;  Laterality: N/A;  . WISDOM TOOTH EXTRACTION     lower 2     Current Meds  Medication Sig  . ALPRAZolam (XANAX) 0.25 MG tablet Take 1 tablet by mouth every 4 (four) hours as needed.  Marland Kitchen amLODipine (NORVASC) 5 MG tablet TAKE 1 TABLET BY MOUTH DAILY  . ezetimibe (ZETIA) 10 MG tablet TAKE 1 TABLET BY MOUTH DAILY  . lisinopril (ZESTRIL) 20 MG tablet TAKE 1 TABLET BY MOUTH TWICE DAILY  . metoprolol succinate (TOPROL-XL) 25 MG 24 hr tablet TAKE 1 TABLET BY MOUTH EVERY MORNING  . omega-3 acid ethyl esters (LOVAZA) 1 g capsule TAKE 1 CAPSULE BY MOUTH TWICE DAILY  . omeprazole (PRILOSEC) 20 MG capsule Take 1 capsule (20 mg total) by mouth daily.  . propafenone (RYTHMOL SR) 325 MG 12 hr capsule TAKE 1 CAPSULE BY MOUTH TWICE DAILY  . REPATHA SURECLICK 140 MG/ML SOAJ INJECT 1 PEN INTO THE SKIN EVERY 14 DAYS  . SYNTHROID 150 MCG  tablet Take 300 mcg by mouth daily.   Carlena Hurl 20 MG TABS tablet TAKE 1 TABLET BY MOUTH DAILY     Allergies:   Crestor [rosuvastatin calcium] and Lipitor [atorvastatin calcium]   Social History   Tobacco Use  . Smoking status: Never Smoker  . Smokeless tobacco: Never Used  Substance Use Topics  . Alcohol use: Yes    Alcohol/week: 8.0 standard drinks    Types: 4 Cans of beer, 4 Shots of liquor per week    Comment: 09/17/2012 "1-2 beers and 1-2 tequila on Fri and Sat nights"  . Drug use: No     Family Hx: The patient's family history includes Cancer in his brother and mother; Heart attack in his maternal grandfather; Hyperlipidemia in his mother; Hypertension in his brother, father, and mother.  ROS:   Please see the history of present illness.     All other systems reviewed and are negative.   Prior CV studies:   The following studies were reviewed today:    Labs/Other Tests and Data Reviewed:    EKG:  No ECG reviewed.  Recent Labs: 11/17/2017: ALT 43; BUN 21; Creatinine, Ser 1.41; Potassium 5.0; Sodium 139   Recent Lipid Panel Lab Results  Component Value Date/Time   CHOL 102 11/17/2017 09:36 AM   TRIG 57 11/17/2017 09:36 AM   HDL 47 11/17/2017 09:36 AM   CHOLHDL 2.2 11/17/2017 09:36 AM   CHOLHDL 4.9 12/18/2015 09:15 AM   LDLCALC 44 11/17/2017 09:36 AM    Wt Readings from Last 3 Encounters:  06/15/18 262 lb (118.8 kg)  12/08/17 262 lb 11.2 oz (119.2 kg)  11/17/17 252 lb (114.3 kg)     Objective:    Vital Signs:  BP 138/84   Pulse 62   Ht  (1.905 m)  Wt 262 lb (118.8 kg)   BMI 32.75 kg/m     ASSESSMENT & PLAN:    1. PAF:  No recent episodes of PAF.   On rhythmol ,  xarelto  2.   Hyperlipidemia.  He currently is on Repatha.  His LDL is well controlled.  Continue Repatha and continue Zetia.  3.  Hypercoagulable state: He has had recurrent pulmonary emboli.  He has an anticardiolipin antibody.  Continue Xarelto.  I will see him again in 4  months for follow-up visit.  We will get an EKG and fasting labs at that time.  COVID-19 Education: The signs and symptoms of COVID-19 were discussed with the patient and how to seek care for testing (follow up with PCP or arrange E-visit).  The importance of social distancing was discussed today.  Time:   Today, I have spent  19  minutes with the patient with telehealth technology discussing the above problems.     Medication Adjustments/Labs and Tests Ordered: Current medicines are reviewed at length with the patient today.  Concerns regarding medicines are outlined above.   Tests Ordered: No orders of the defined types were placed in this encounter.   Medication Changes: No orders of the defined types were placed in this encounter.   Disposition:  Follow up in 4 month(s)  Signed, Isaiah Miss, MD  06/15/2018 8:14 AM    [facility]

## 2018-06-16 ENCOUNTER — Other Ambulatory Visit: Payer: Self-pay | Admitting: Family Medicine

## 2018-06-18 ENCOUNTER — Other Ambulatory Visit: Payer: Self-pay | Admitting: Cardiovascular Disease

## 2018-07-30 ENCOUNTER — Other Ambulatory Visit: Payer: Self-pay | Admitting: Family Medicine

## 2018-07-30 NOTE — Telephone Encounter (Signed)
Please send this RF request to pt's cardiologist Dr Acie Fredrickson, or his nurses last name I believe is Designer, fashion/clothing, Therapist, sports. Thanks.

## 2018-07-31 NOTE — Telephone Encounter (Signed)
Please review and advise. MPulliam, CMA/RT(R)  

## 2018-08-02 ENCOUNTER — Telehealth: Payer: Self-pay | Admitting: Family Medicine

## 2018-08-02 NOTE — Telephone Encounter (Signed)
Patient called states he has been having some Rt Side around to back pain ( it doesn't feel like Statica but he doesn't know, no difficulty w/ urination and unsure if bladder or kidney?? --- Advised provider & med asst are in room w/ patient but forwarding urgent message for one of them to call him back.   Per patient call this ph# [phone] ask fr him.  --glh

## 2018-08-02 NOTE — Telephone Encounter (Signed)
Called and spoke to the patient pain is in the right hip area x 2 weeks.  Patient advised per Dr. Raliegh Scarlet to ice the area and use tylenol as needed.  If pain continues to call the office for appointment with Mina Marble, NP early next week.  Patient advised if pain increases to go for treatment at an urgent care while we are out of the office. MPulliam, CMA/RT(R)

## 2018-08-06 DIAGNOSIS — M9903 Segmental and somatic dysfunction of lumbar region: Secondary | ICD-10-CM | POA: Diagnosis not present

## 2018-08-06 DIAGNOSIS — M9904 Segmental and somatic dysfunction of sacral region: Secondary | ICD-10-CM | POA: Diagnosis not present

## 2018-08-06 DIAGNOSIS — M5386 Other specified dorsopathies, lumbar region: Secondary | ICD-10-CM | POA: Diagnosis not present

## 2018-08-06 DIAGNOSIS — M5136 Other intervertebral disc degeneration, lumbar region: Secondary | ICD-10-CM | POA: Diagnosis not present

## 2018-08-15 DIAGNOSIS — M5386 Other specified dorsopathies, lumbar region: Secondary | ICD-10-CM | POA: Diagnosis not present

## 2018-08-15 DIAGNOSIS — M9903 Segmental and somatic dysfunction of lumbar region: Secondary | ICD-10-CM | POA: Diagnosis not present

## 2018-08-15 DIAGNOSIS — M9904 Segmental and somatic dysfunction of sacral region: Secondary | ICD-10-CM | POA: Diagnosis not present

## 2018-08-15 DIAGNOSIS — M5136 Other intervertebral disc degeneration, lumbar region: Secondary | ICD-10-CM | POA: Diagnosis not present

## 2018-08-29 ENCOUNTER — Other Ambulatory Visit: Payer: Self-pay | Admitting: Family Medicine

## 2018-09-07 ENCOUNTER — Telehealth: Payer: Self-pay | Admitting: Cardiology

## 2018-09-07 DIAGNOSIS — G4733 Obstructive sleep apnea (adult) (pediatric): Secondary | ICD-10-CM

## 2018-09-07 NOTE — Telephone Encounter (Signed)
New message:     Patient calling he needs a  travling size CPAP. Please call patient.

## 2018-09-07 NOTE — Telephone Encounter (Signed)
Spoke with the patient regarding his request for a travel size CPAP. He says he is in need of a prescription for this, states Dr. Radford Pax has written him a rx for the smaller CPAP previously but pt lost the prescription. I will route to [facility] for advisement. He had f/u appt with [facility] scheduled for October.

## 2018-09-08 NOTE — Telephone Encounter (Signed)
Please order a travel CPAP at 14cm H2O with heated humidity

## 2018-09-10 NOTE — Addendum Note (Signed)
Addended by: Freada Bergeron on: 09/10/2018 04:27 PM   Modules accepted: Orders

## 2018-09-10 NOTE — Telephone Encounter (Signed)
Prescription printed today placed in envelope and put at front desk for patient pick up.

## 2018-09-20 ENCOUNTER — Ambulatory Visit: Payer: BLUE CROSS/BLUE SHIELD | Admitting: Family Medicine

## 2018-09-21 ENCOUNTER — Other Ambulatory Visit: Payer: Self-pay | Admitting: Family Medicine

## 2018-09-21 DIAGNOSIS — I10 Essential (primary) hypertension: Secondary | ICD-10-CM

## 2018-09-27 ENCOUNTER — Telehealth: Payer: Self-pay | Admitting: Nurse Practitioner

## 2018-09-27 NOTE — Telephone Encounter (Signed)
Left detailed message for patient to call back regarding his virtual appointment with Dr. Acie Fredrickson on 9/17. I asked him to call back to reschedule to a day that Dr. Acie Fredrickson is in the office.

## 2018-10-01 NOTE — Telephone Encounter (Signed)
Left 2nd message for patient requesting him to call back to reschedule appointment to a day that Dr. Acie Fredrickson is in the office

## 2018-10-03 NOTE — Telephone Encounter (Signed)
Spoke with Isaiah [name] who states that he needs to reschedule his appt with Dr. Cathie Olden on 9/17. Isaiah [name] states that he will call our office back to get a new schedule date and time.

## 2018-10-04 ENCOUNTER — Ambulatory Visit: Payer: BLUE CROSS/BLUE SHIELD | Admitting: Cardiovascular Disease

## 2018-10-09 ENCOUNTER — Other Ambulatory Visit: Payer: Self-pay

## 2018-10-09 ENCOUNTER — Other Ambulatory Visit: Payer: Self-pay | Admitting: Cardiovascular Disease

## 2018-10-09 ENCOUNTER — Encounter: Payer: Self-pay | Admitting: Family Medicine

## 2018-10-09 ENCOUNTER — Ambulatory Visit (INDEPENDENT_AMBULATORY_CARE_PROVIDER_SITE_OTHER): Payer: BC Managed Care – PPO | Admitting: Family Medicine

## 2018-10-09 VITALS — BP 127/69 | HR 75 | Temp 98.8°F | Wt 256.5 lb

## 2018-10-09 DIAGNOSIS — N183 Chronic kidney disease, stage 3 unspecified: Secondary | ICD-10-CM

## 2018-10-09 DIAGNOSIS — E782 Mixed hyperlipidemia: Secondary | ICD-10-CM | POA: Diagnosis not present

## 2018-10-09 DIAGNOSIS — Z842 Family history of other diseases of the genitourinary system: Secondary | ICD-10-CM

## 2018-10-09 DIAGNOSIS — D6859 Other primary thrombophilia: Secondary | ICD-10-CM

## 2018-10-09 DIAGNOSIS — Z7901 Long term (current) use of anticoagulants: Secondary | ICD-10-CM

## 2018-10-09 DIAGNOSIS — I10 Essential (primary) hypertension: Secondary | ICD-10-CM | POA: Diagnosis not present

## 2018-10-09 DIAGNOSIS — E66811 Obesity, class 1: Secondary | ICD-10-CM

## 2018-10-09 DIAGNOSIS — Z1321 Encounter for screening for nutritional disorder: Secondary | ICD-10-CM

## 2018-10-09 DIAGNOSIS — I480 Paroxysmal atrial fibrillation: Secondary | ICD-10-CM

## 2018-10-09 DIAGNOSIS — E669 Obesity, unspecified: Secondary | ICD-10-CM

## 2018-10-09 DIAGNOSIS — Z86718 Personal history of other venous thrombosis and embolism: Secondary | ICD-10-CM

## 2018-10-09 DIAGNOSIS — E559 Vitamin D deficiency, unspecified: Secondary | ICD-10-CM

## 2018-10-09 MED ORDER — AMLODIPINE BESYLATE 5 MG PO TABS: 5.0000 mg | ORAL_TABLET | Freq: Every day | ORAL | 3 refills | Status: DC

## 2018-10-09 MED ORDER — LISINOPRIL 20 MG PO TABS: 20.0000 mg | ORAL_TABLET | Freq: Two times a day (BID) | ORAL | 3 refills | Status: DC

## 2018-10-09 NOTE — Telephone Encounter (Signed)
Prescription refill request for Xarelto received.   Last office visit: Isaiah [name] ( 06-15-2018) Weight: 119.2 kg Age: 58 y.o. Scr: 1.41 (11-17-2017) CrCl: 99 ml/min  Prescription refill sent.

## 2018-10-09 NOTE — Progress Notes (Signed)
Impression and Recommendations:    1. Essential hypertension   2. Chronic kidney disease (CKD), stage III (moderate) (HCC)   3. Mixed hyperlipidemia   4. PAF (paroxysmal atrial fibrillation) (HCC)   5. Hypercoagulopathy (HCC)   6. Chronic anticoagulation- abnormal lupus anticoagulant   7. Personal history of multiple DVTs (deep vein thrombosis)   8. Hypertension, unspecified type   9. Family history of prostate problems   10. Obesity (BMI 30.0-34.9)   11. Encounter for vitamin deficiency screening   12. Vitamin D insufficiency     Essential Hypertension - Stable at this time. - Continue treatment plan as established.  See med list below. - Patient tolerating meds well without complication.  Denies S-E  - Lifestyle changes such as dash diet and engaging in a regular exercise program discussed with patient.  Educational handouts provided.  - Ambulatory BP monitoring encouraged. Keep log and bring in next OV.  - Will continue to monitor.  Chronic kidney disease (CKD), stage III (moderate)  - Discussed that patient's GFR was 50 last check, and serum creatinine was 1.55. - Advised patient to avoid nephrotoxic substances, hydrate adequately, and engage in physical activity. - Education provided today regarding preservation of organ health. - Patient knows to continue avoiding NSAID's like ibuprofen.  - Will continue to monitor and re-check as recommended.  Sleep Apnea, managed on CPAP - Per pt, stable at this time. - Advised pt to continue maintenance of CPAP as advised. - Will continue to monitor.  PAF (paroxysmal atrial fibrillation) - Stable at this time. - Continue management and follow-up with specialists as established. - Will continue to monitor.  Chronic anticoagulation, Hypercoagulopathy, Personal history of multiple DVTs - Treatment plan managed by Cardiology. - Patient knows to continue to obtain follow-up and management through Cardiology. - Will  continue to monitor.  Cholesterol - Managed by Dr. Elease Hashimoto - Fasting lipid profile last checked 11/17/2017.  Triglycerides = 57, down from 98 prior. HDL = 47, up from 43 prior. LDL = 44, down from 72 prior.  - Continue treatment plan as established by Dr. Elease Hashimoto.  See med list. - Patient knows to obtain management from specialist as established.  - Will continue to monitor.  BMI Counseling - Body mass index is 32.06 kg/m Explained to patient what BMI refers to, and what it means medically.    Told patient to think about it as a "medical risk stratification measurement" and how increasing BMI is associated with increasing risk/ or worsening state of various diseases such as hypertension, hyperlipidemia, diabetes, premature OA, depression etc.  American Heart Association guidelines for healthy diet, basically Mediterranean diet, and exercise guidelines of 30 minutes 5 days per week or more discussed in detail.  Health counseling performed.  All questions answered.  Lifestyle & Preventative Health Maintenance - Advised patient to continue working toward exercising to improve overall mental, physical, and emotional health.    - Reviewed the "spokes of the wheel" of mood and health management.  Stressed the importance of ongoing prudent habits, including regular exercise, appropriate sleep hygiene, healthful dietary habits, and prayer/meditation to relax.  - Encouraged patient to engage in daily physical activity, especially a formal exercise routine.  Recommended that the patient eventually strive for at least 150 minutes of moderate cardiovascular activity per week according to guidelines established by the [facility].   - Healthy dietary habits encouraged, including low-carb, and high amounts of lean protein in diet.   - Patient should also  consume adequate amounts of water.  Recommendations - Need for fasting blood work in near future. - COVID antibody test ordered today.  - Return in 4-6  months, sooner if concerns about BP.  - Discussed need for patient to continue to obtain chronic management and screenings as advised, including with all established specialists.  Educated patient at length about the critical importance of keeping health maintenance up to date.  - Participated in lengthy conversation and all questions were answered.  Spent over with pt in office today for direct pt care.  Orders Placed This Encounter  Procedures  . Comprehensive metabolic panel  . Lipid panel  . CBC with Differential/Platelet  . VITAMIN D 25 Hydroxy (Vit-D Deficiency, Fractures)  . Microalbumin / creatinine urine ratio  . PSA, total and free    Meds ordered this encounter  Medications  . lisinopril (ZESTRIL) 20 MG tablet    Sig: Take 1 tablet (20 mg total) by mouth 2 (two) times daily.    Dispense:  180 tablet    Refill:  3    Needs ov for further RF  . amLODipine (NORVASC) 5 MG tablet    Sig: Take 1 tablet (5 mg total) by mouth daily.    Dispense:  90 tablet    Refill:  3    Medications Discontinued During This Encounter  Medication Reason  . amLODipine (NORVASC) 5 MG tablet Reorder  . lisinopril (ZESTRIL) 20 MG tablet Reorder     Gross side effects, risk and benefits, and alternatives of medications and treatment plan in general discussed with patient.  Patient is aware that all medications have potential side effects and we are unable to predict every side effect or drug-drug interaction that may occur.   Patient will call with any questions prior to using medication if they have concerns.    Expresses verbal understanding and consents to current therapy and treatment regimen.  No barriers to understanding were identified.  Red flag symptoms and signs discussed in detail.  Patient expressed understanding regarding what to do in case of emergency\urgent symptoms  Please see AVS handed out to patient at the end of our visit for further patient instructions/ counseling  done pertaining to today's office visit.   Return for 4-60mo f/up htn- sooner if concerns for OV; near future fasting labs.     Note:  This note was prepared with assistance of Dragon voice recognition software. Occasional wrong-word or sound-a-like substitutions may have occurred due to the inherent limitations of voice recognition software.   This document serves as a record of services personally performed by [name], DO. It was created on her behalf by [name], a trained medical scribe. The creation of this record is based on the scribe's personal observations and the provider's statements to them.   I have reviewed the above medical documentation for accuracy and completeness and I concur.  [name], DO 10/13/2018 3:40 PM   --------------------------------------------------------------------------------------------------------------------------------------------------------------------------------------------------------------------------------------------    Subjective:     HPI: Isaiah [name], Isaiah [name] is a 58 y.o. male who presents to [facility] today for issues as discussed below.  Says he's been frustrated with everything that's going on with COVID.  Explains how he's been frustrated with the way that both his clinic and the country have been handling COVID.  Patient believes that he has likely been exposed to COVID at his workplace, given the nature of his job.  Notes his significant other Lawson Fiscal is  doing well with her breast cancer; she has been placed on furlough for a year and a half.  Her son graduated high school and now she has moved in with the patient; her son now lives at her house while she lives with the patient.  He says "it's different [to live with her] after ten years of me being alone and seeing her on Fridays and Saturdays.  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  Continues having concerns about his relationships with his daughters.  - Chronic Back Pain Says he hurts due to concerns with his spine and may seek further surgery in the future, but says "I still function."    - Weight Gain Notes he weighed 233 lbs in the past and wants to lose weight again.  Notes he doesn't eat super healthfully while he goes on trips and rides motorcycles.  - Sleep Apnea, managed with CPAP Feels his sleep apnea is well controlled.  - Cardiac Health Taking all of his medications. Continues to follow up with cardiology.  1. HTN HPI:  -  His blood pressure has been controlled at home.  Pt is checking it at home.   - Patient reports good compliance with blood pressure medications.  Says his heart has been "great;" taking his medications religiously.  - Denies medication S-E   - Smoking Status noted   - He denies new onset of: chest pain, exercise intolerance, shortness of breath, dizziness, visual changes, headache, lower extremity swelling or claudication.   Last 3 blood pressure readings in our office are as follows: BP Readings from Last 3 Encounters:  10/09/18 127/69  06/15/18 138/84  12/08/17 119/67    Filed Weights   10/09/18 1614  Weight: 256 lb 8 oz (116.3 kg)         Wt Readings from Last 3 Encounters:  10/09/18 256 lb 8 oz (116.3 kg)  06/15/18 262 lb (118.8 kg)  12/08/17 262 lb 11.2 oz (119.2 kg)   BP Readings from Last 3 Encounters:  10/09/18 127/69  06/15/18 138/84  12/08/17 119/67   Pulse Readings from Last 3 Encounters:  10/09/18 75  06/15/18 62  12/08/17 67   BMI Readings from Last 3 Encounters:  10/09/18 32.06 kg/m  06/15/18 32.75 kg/m  12/08/17 32.84 kg/m     Patient Care Team    Relationship Specialty Notifications Start End  Mellody Dance, DO PCP - General Family Medicine  08/26/15   [name], Wonda Cheng, MD PCP - Cardiology Cardiology Admissions 11/17/17    Danella Sensing, MD Consulting Physician Dermatology  10/01/15   Sueanne Margarita, MD Consulting Physician Cardiology  10/01/15    Comment: for OSA/  Sleep Med  Reynold Bowen, MD Consulting Physician Endocrinology  05/16/16    Comment: sees him for hypothyroidism  Carolan Clines, MD (Inactive) Consulting Physician Urology  05/16/16      Patient Active Problem List   Diagnosis Date Noted  . Hypercoagulopathy ([place]) 08/05/2016    Priority: High  . Chronic anticoagulation- abnormal lupus anticoagulant 08/27/2015    Priority: High  . Personal history of multiple DVTs (deep vein thrombosis) 08/27/2015    Priority: High  . Other reactions to severe stress 08/27/2015    Priority: High  . CPAP (continuous positive airway pressure) dependence 08/27/2015    Priority: High  . OSA (obstructive sleep apnea) 05/02/2014    Priority: High  .  Dyslipidemia 05/10/2013    Priority: High  . HTN (hypertension) 10/01/2010    Priority: High  . PAF (paroxysmal atrial fibrillation) (HCC) 10/01/2010    Priority: High  . Osteoarthritis of multiple joints 08/27/2015    Priority: Medium  . h/o Lyme disease- 2014 tick bite 08/27/2015    Priority: Medium  . Memory changes- since Lyme Dx 08/27/2015    Priority: Medium  . History of chronic Myalgias since Lyme Dx 2014 05/10/2013    Priority: Medium  . h/o Pulmonary embolism and infarction (HCC) 09/17/2012    Priority: Medium  . Sessile colonic polyp- descending colon 09/28/2016    Priority: Low  . Seborrheic dermatitis of scalp 10/01/2015    Priority: Low  . GERD (gastroesophageal reflux disease) 08/27/2015    Priority: Low  . Hypothyroidism- followed by Dr. Adrian [facility] of End[facility] 12/17/2010    Priority: Low  . High risk medication use 10/01/2010    Priority: Low  . Chronic kidney disease (CKD), stage III (moderate) (HCC) 10/09/2018  . Verruca pedis 12/11/2017  . Essential hypertension 12/11/2017  . Gastroesophageal reflux disease 12/11/2017  . Pain  in right knee 07/28/2017  . Fever 02/20/2017  . Obesity (BMI 30-39.9) 07/17/2014  . Cough 09/28/2012  . Chest pain 01/27/2012    Past Medical history, Surgical history, Family history, Social history, Allergies and Medications have been entered into the medical record, reviewed and changed as needed.    Current Meds  Medication Sig  . ALPRAZolam (XANAX) 0.25 MG tablet Take 1 tablet by mouth every 4 (four) hours as needed.  Marland Kitchen. amLODipine (NORVASC) 5 MG tablet Take 1 tablet (5 mg total) by mouth daily.  Marland Kitchen. ezetimibe (ZETIA) 10 MG tablet TAKE 1 TABLET BY MOUTH DAILY  . lisinopril (ZESTRIL) 20 MG tablet Take 1 tablet (20 mg total) by mouth 2 (two) times daily.  . metoprolol succinate (TOPROL-XL) 25 MG 24 hr tablet TAKE 1 TABLET BY MOUTH EVERY MORNING  . omega-3 acid ethyl esters (LOVAZA) 1 g capsule TAKE 1 CAPSULE BY MOUTH TWICE DAILY  . omeprazole (PRILOSEC) 20 MG capsule Take 1 capsule (20 mg total) by mouth daily.  . propafenone (RYTHMOL SR) 325 MG 12 hr capsule TAKE 1 CAPSULE BY MOUTH TWICE DAILY  . REPATHA SURECLICK 140 MG/ML SOAJ INJECT 1 PEN INTO THE SKIN EVERY 14 DAYS  . SYNTHROID 150 MCG tablet Take 300 mcg by mouth daily.   Carlena Hurl. XARELTO 20 MG TABS tablet TAKE 1 TABLET BY MOUTH DAILY  . [DISCONTINUED] amLODipine (NORVASC) 5 MG tablet TAKE 1 TABLET BY MOUTH DAILY  . [DISCONTINUED] lisinopril (ZESTRIL) 20 MG tablet TAKE 1 TABLET BY MOUTH TWICE DAILY    Allergies:  Allergies  Allergen Reactions  . Crestor [Rosuvastatin Calcium] Other (See Comments)    hepatitis  . Lipitor [Atorvastatin Calcium] Other (See Comments)    Feels like someone hit him with 2x4     Review of Systems:  A fourteen system review of systems was performed and found to be positive as per HPI.   Objective:   Blood pressure 127/69, pulse 75, temperature 98.8 F (37.1 C), temperature source Oral, weight 256 lb 8 oz (116.3 kg), SpO2 97 %. Body mass index is 32.06 kg/m. General:  Well Developed, well  nourished, appropriate for stated age.  Neuro:  Alert and oriented,  extra-ocular muscles intact  HEENT:  Normocephalic, atraumatic, neck supple, no carotid bruits appreciated  Skin:  no gross rash, warm, pink. Cardiac:  RRR, S1 S2 Respiratory:  ECTA B/L and A/P, Not using accessory muscles, speaking in full sentences- unlabored. Vascular:  Ext warm, no cyanosis apprec.; cap RF less 2 sec. Psych:  No HI/SI, judgement and insight good, Euthymic mood. Full Affect.

## 2018-10-11 ENCOUNTER — Telehealth: Payer: Self-pay | Admitting: *Deleted

## 2018-10-11 NOTE — Telephone Encounter (Signed)

## 2018-10-12 ENCOUNTER — Telehealth: Payer: Self-pay | Admitting: Cardiovascular Disease

## 2018-10-12 ENCOUNTER — Other Ambulatory Visit: Payer: Self-pay

## 2018-10-12 ENCOUNTER — Other Ambulatory Visit (INDEPENDENT_AMBULATORY_CARE_PROVIDER_SITE_OTHER): Payer: BC Managed Care – PPO

## 2018-10-12 DIAGNOSIS — E669 Obesity, unspecified: Secondary | ICD-10-CM | POA: Diagnosis not present

## 2018-10-12 DIAGNOSIS — E559 Vitamin D deficiency, unspecified: Secondary | ICD-10-CM

## 2018-10-12 DIAGNOSIS — Z842 Family history of other diseases of the genitourinary system: Secondary | ICD-10-CM | POA: Diagnosis not present

## 2018-10-12 DIAGNOSIS — I10 Essential (primary) hypertension: Secondary | ICD-10-CM

## 2018-10-12 DIAGNOSIS — Z1321 Encounter for screening for nutritional disorder: Secondary | ICD-10-CM

## 2018-10-12 DIAGNOSIS — N183 Chronic kidney disease, stage 3 unspecified: Secondary | ICD-10-CM

## 2018-10-12 DIAGNOSIS — E782 Mixed hyperlipidemia: Secondary | ICD-10-CM

## 2018-10-12 NOTE — Telephone Encounter (Signed)
  Patient would like to know if he needs to do any lab work before visit. If so, please let him know so he can get that scheduled. Appt is scheduled for 11/23/18

## 2018-10-12 NOTE — Telephone Encounter (Signed)
Advised patient that no additional lab work is needed prior to his appointment with Dr. Acie Fredrickson. Patient had lab work done today by PCP. He thanked me for the call.

## 2018-10-13 LAB — CBC WITH DIFFERENTIAL/PLATELET
Basophils Absolute: 0.1 10*3/uL (ref 0.0–0.2)
Basos: 1 %
EOS (ABSOLUTE): 0.1 10*3/uL (ref 0.0–0.4)
Eos: 1 %
Hematocrit: 47.9 % (ref 37.5–51.0)
Hemoglobin: 15.7 g/dL (ref 13.0–17.7)
Immature Grans (Abs): 0 10*3/uL (ref 0.0–0.1)
Immature Granulocytes: 0 %
Lymphocytes Absolute: 1.3 10*3/uL (ref 0.7–3.1)
Lymphs: 29 %
MCH: 26.8 pg (ref 26.6–33.0)
MCHC: 32.8 g/dL (ref 31.5–35.7)
MCV: 82 fL (ref 79–97)
Monocytes Absolute: 0.5 10*3/uL (ref 0.1–0.9)
Monocytes: 10 %
Neutrophils Absolute: 2.6 10*3/uL (ref 1.4–7.0)
Neutrophils: 59 %
Platelets: 236 10*3/uL (ref 150–450)
RBC: 5.86 x10E6/uL — ABNORMAL HIGH (ref 4.14–5.80)
RDW: 16.4 % — ABNORMAL HIGH (ref 11.6–15.4)
WBC: 4.4 10*3/uL (ref 3.4–10.8)

## 2018-10-13 LAB — COMPREHENSIVE METABOLIC PANEL
ALT: 25 IU/L (ref 0–44)
AST: 29 IU/L (ref 0–40)
Albumin/Globulin Ratio: 1.8 (ref 1.2–2.2)
Albumin: 4.4 g/dL (ref 3.8–4.9)
Alkaline Phosphatase: 84 IU/L (ref 39–117)
BUN/Creatinine Ratio: 16 (ref 9–20)
BUN: 26 mg/dL — ABNORMAL HIGH (ref 6–24)
Bilirubin Total: 0.8 mg/dL (ref 0.0–1.2)
CO2: 25 mmol/L (ref 20–29)
Calcium: 10 mg/dL (ref 8.7–10.2)
Chloride: 100 mmol/L (ref 96–106)
Creatinine, Ser: 1.62 mg/dL — ABNORMAL HIGH (ref 0.76–1.27)
GFR calc Af Amer: 54 mL/min/{1.73_m2} — ABNORMAL LOW (ref >59)
GFR calc non Af Amer: 47 mL/min/{1.73_m2} — ABNORMAL LOW (ref >59)
Globulin, Total: 2.5 g/dL (ref 1.5–4.5)
Glucose: 93 mg/dL (ref 65–99)
Potassium: 5.2 mmol/L (ref 3.5–5.2)
Sodium: 138 mmol/L (ref 134–144)
Total Protein: 6.9 g/dL (ref 6.0–8.5)

## 2018-10-13 LAB — HEMOGLOBIN A1C
Est. average glucose Bld gHb Est-mCnc: 120 mg/dL
Hgb A1c MFr Bld: 5.8 % — ABNORMAL HIGH (ref 4.8–5.6)

## 2018-10-13 LAB — LIPID PANEL
Chol/HDL Ratio: 2.3 ratio (ref 0.0–5.0)
Cholesterol, Total: 103 mg/dL (ref 100–199)
HDL: 44 mg/dL (ref >39)
LDL Chol Calc (NIH): 46 mg/dL (ref 0–99)
Triglycerides: 55 mg/dL (ref 0–149)
VLDL Cholesterol Cal: 13 mg/dL (ref 5–40)

## 2018-10-13 LAB — MICROALBUMIN / CREATININE URINE RATIO
Creatinine, Urine: 112.1 mg/dL
Microalb/Creat Ratio: 3 mg/g creat (ref 0–29)
Microalbumin, Urine: 3.1 ug/mL

## 2018-10-13 LAB — PSA, TOTAL AND FREE
PSA, Free Pct: 15.6 %
PSA, Free: 1.03 ng/mL
Prostate Specific Ag, Serum: 6.6 ng/mL — ABNORMAL HIGH (ref 0.0–4.0)

## 2018-10-13 LAB — VITAMIN D 25 HYDROXY (VIT D DEFICIENCY, FRACTURES): Vit D, 25-Hydroxy: 68.5 ng/mL (ref 30.0–100.0)

## 2018-10-15 ENCOUNTER — Encounter: Payer: Self-pay | Admitting: Family Medicine

## 2018-10-15 DIAGNOSIS — E785 Hyperlipidemia, unspecified: Secondary | ICD-10-CM | POA: Diagnosis not present

## 2018-10-15 DIAGNOSIS — I480 Paroxysmal atrial fibrillation: Secondary | ICD-10-CM | POA: Diagnosis not present

## 2018-10-15 DIAGNOSIS — D6859 Other primary thrombophilia: Secondary | ICD-10-CM | POA: Diagnosis not present

## 2018-10-15 DIAGNOSIS — N183 Chronic kidney disease, stage 3 (moderate): Secondary | ICD-10-CM | POA: Diagnosis not present

## 2018-10-15 DIAGNOSIS — E039 Hypothyroidism, unspecified: Secondary | ICD-10-CM | POA: Diagnosis not present

## 2018-10-15 DIAGNOSIS — Z23 Encounter for immunization: Secondary | ICD-10-CM | POA: Diagnosis not present

## 2018-10-15 NOTE — Addendum Note (Signed)
Addended by: Amado Coe on: 10/15/2018 01:23 PM   Modules accepted: Orders

## 2018-10-16 ENCOUNTER — Other Ambulatory Visit: Payer: Self-pay | Admitting: Cardiovascular Disease

## 2018-10-18 ENCOUNTER — Encounter: Payer: Self-pay | Admitting: Family Medicine

## 2018-10-18 DIAGNOSIS — R351 Nocturia: Secondary | ICD-10-CM | POA: Diagnosis not present

## 2018-10-18 DIAGNOSIS — R972 Elevated prostate specific antigen [PSA]: Secondary | ICD-10-CM | POA: Diagnosis not present

## 2018-10-18 DIAGNOSIS — N401 Enlarged prostate with lower urinary tract symptoms: Secondary | ICD-10-CM | POA: Diagnosis not present

## 2018-10-19 ENCOUNTER — Other Ambulatory Visit: Payer: Self-pay

## 2018-10-19 ENCOUNTER — Encounter: Payer: Self-pay | Admitting: *Deleted

## 2018-10-19 ENCOUNTER — Telehealth (INDEPENDENT_AMBULATORY_CARE_PROVIDER_SITE_OTHER): Payer: BC Managed Care – PPO | Admitting: Cardiology

## 2018-10-19 ENCOUNTER — Encounter: Payer: Self-pay | Admitting: Cardiology

## 2018-10-19 VITALS — BP 122/69 | Ht 75.0 in | Wt 250.0 lb

## 2018-10-19 DIAGNOSIS — G4733 Obstructive sleep apnea (adult) (pediatric): Secondary | ICD-10-CM

## 2018-10-19 DIAGNOSIS — E669 Obesity, unspecified: Secondary | ICD-10-CM | POA: Diagnosis not present

## 2018-10-19 DIAGNOSIS — I10 Essential (primary) hypertension: Secondary | ICD-10-CM

## 2018-10-19 NOTE — Progress Notes (Signed)
Virtual Visit via Telephone Note   This visit type was conducted due to national recommendations for restrictions regarding the COVID-19 Pandemic (e.g. social distancing) in an effort to limit this patient's exposure and mitigate transmission in our community.  Due to his co-morbid illnesses, this patient is at least at moderate risk for complications without adequate follow up.  This format is felt to be most appropriate for this patient at this time.  All issues noted in this document were discussed and addressed.  A limited physical exam was performed with this format.  Please refer to the patient's chart for his consent to telehealth for [facility].   Evaluation Performed:  Follow-up visit  This visit type was conducted due to national recommendations for restrictions regarding the COVID-19 Pandemic (e.g. social distancing).  This format is felt to be most appropriate for this patient at this time.  All issues noted in this document were discussed and addressed.  No physical exam was performed (except for noted visual exam findings with Video Visits).  Please refer to the patient's chart (MyChart message for video visits and phone note for telephone visits) for the patient's consent to telehealth for [facility].  Date:  10/19/2018   ID:  Isaiah RavensBlake S Dutko, Isaiah [name], DOB Apr 04, 1960, MRN [id number]  Patient Location:  Home  Provider location:   [place]  PCP:  Thomasene Lotpalski, Deborah, DO  Cardiologist:  Kristeen MissPhilip [name], MD  Sleep Medicine:  Armanda Magicraci Turner, MD Electrophysiologist:  None   Chief Complaint:  OSA  History of Present Illness:    Isaiah [name], Isaiah [name] is a 58 y.o. male who presents via audio/video conferencing for a telehealth visit today.    Isaiah [name], Isaiah [name] is a 58 y.o. male with a hx of severe OSA with an AHI of 33 events per hour. He had oxygen desaturations as low as 82%. His Epworth sleepiness scale was elevated at 18 prior to going on CPAP.   He is doing well with  his CPAP device and thinks that he has gotten used to it.  He tolerates the mask and feels the pressure is adequate.  Since going on CPAP He feels rested in the am and has no significant daytime sleepiness.  He denies any significant mouth or nasal dryness or nasal congestion.  He does not think that he snores.    The patient does not have symptoms concerning for COVID-19 infection (fever, chills, cough, or new shortness of breath).   Prior CV studies:   The following studies were reviewed today:  PAP download compliance report  Past Medical History:  Diagnosis Date  . Blood dyscrasia    lupus anticoagulant  . Diastolic dysfunction    Grade I per echo in 2/12  . DVT (deep venous thrombosis) (HCC) 2000's   "both legs in the calves; left went all the way up to my groin" (09/17/2012)  . Dysrhythmia   . GERD (gastroesophageal reflux disease)   . History of bronchitis   . Hypercoagulation syndrome (HCC)    secondary to circulating lupus anticoagulant  . Hyperlipidemia   . Hypertension   . Hypothyroidism    "wiped out from taking amiodarone" (09/17/2012)  . Lyme disease    history of   . Obesity (BMI 30-39.9) 07/17/2014  . PAF (paroxysmal atrial fibrillation) (HCC)    no recurrence since Feb 2012  . PONV (postoperative nausea and vomiting)    likes scopolomanine patch  . Pulmonary embolism (HCC) 09/17/2012   "just today; 3  on the right; 2 on the left" (09/17/2012)  . Situational stress   . Sleep apnea    uses CPAP   Past Surgical History:  Procedure Laterality Date  . CARDIOVASCULAR STRESS TEST  07/17/2009   EF 59%  . CARDIOVERSION    . DISTAL BICEPS TENDON REPAIR Right 2011  . INGUINAL HERNIA REPAIR Right 1992  . INGUINAL HERNIA REPAIR Left 2013  . INSERTION OF MESH N/A 05/14/2015   Procedure: INSERTION OF MESH;  Surgeon: Avel Peace, MD;  Location: [facility];  Service: General;  Laterality: N/A;  . SHOULDER ARTHROSCOPY W/ ROTATOR CUFF REPAIR Left 09/04/2012  . SHOULDER SURGERY      left  . TOTAL HIP ARTHROPLASTY Right 11/26/2014   Procedure: RIGHT TOTAL HIP ARTHROPLASTY ANTERIOR APPROACH;  Surgeon: Ollen Gross, MD;  Location: [facility];  Service: Orthopedics;  Laterality: Right;  . TRANSTHORACIC ECHOCARDIOGRAM  03/05/2010   EF 60-65%  . UMBILICAL HERNIA REPAIR N/A 05/14/2015   Procedure: HERNIA REPAIR UMBILICAL ADULT WITH MESH ;  Surgeon: Avel Peace, MD;  Location: [facility];  Service: General;  Laterality: N/A;  . WISDOM TOOTH EXTRACTION     lower 2     Current Meds  Medication Sig  . ALPRAZolam (XANAX) 0.25 MG tablet Take 1 tablet by mouth every 4 (four) hours as needed.  Marland Kitchen amLODipine (NORVASC) 5 MG tablet Take 1 tablet (5 mg total) by mouth daily.  Marland Kitchen ezetimibe (ZETIA) 10 MG tablet TAKE 1 TABLET BY MOUTH DAILY  . lisinopril (ZESTRIL) 20 MG tablet Take 1 tablet (20 mg total) by mouth 2 (two) times daily.  . metoprolol succinate (TOPROL-XL) 25 MG 24 hr tablet TAKE 1 TABLET BY MOUTH EVERY MORNING  . omega-3 acid ethyl esters (LOVAZA) 1 g capsule TAKE 1 CAPSULE BY MOUTH TWICE DAILY  . omeprazole (PRILOSEC) 20 MG capsule Take 1 capsule (20 mg total) by mouth daily.  . propafenone (RYTHMOL SR) 325 MG 12 hr capsule TAKE 1 CAPSULE BY MOUTH TWICE DAILY  . REPATHA SURECLICK 140 MG/ML SOAJ INJECT 1 PEN INTO THE SKIN EVERY 14 DAYS  . SYNTHROID 150 MCG tablet Take 300 mcg by mouth daily.   Carlena Hurl 20 MG TABS tablet TAKE 1 TABLET BY MOUTH DAILY     Allergies:   Crestor [rosuvastatin calcium] and Lipitor [atorvastatin calcium]   Social History   Tobacco Use  . Smoking status: Never Smoker  . Smokeless tobacco: Never Used  Substance Use Topics  . Alcohol use: Yes    Alcohol/week: 8.0 standard drinks    Types: 4 Cans of beer, 4 Shots of liquor per week    Comment: 09/17/2012 "1-2 beers and 1-2 tequila on Fri and Sat nights"  . Drug use: No     Family Hx: The patient's family history includes Cancer in his brother and mother; Heart attack in his maternal grandfather;  Hyperlipidemia in his mother; Hypertension in his brother, father, and mother.  ROS:   Please see the history of present illness.     All other systems reviewed and are negative.   Labs/Other Tests and Data Reviewed:    Recent Labs: 10/12/2018: ALT 25; BUN 26; Creatinine, Ser 1.62; Hemoglobin 15.7; Platelets 236; Potassium 5.2; Sodium 138   Recent Lipid Panel Lab Results  Component Value Date/Time   CHOL 103 10/12/2018 09:27 AM   TRIG 55 10/12/2018 09:27 AM   HDL 44 10/12/2018 09:27 AM   CHOLHDL 2.3 10/12/2018 09:27 AM   CHOLHDL 4.9 12/18/2015 09:15 AM  LDLCALC 46 10/12/2018 09:27 AM    Wt Readings from Last 3 Encounters:  10/19/18 250 lb (113.4 kg)  10/09/18 256 lb 8 oz (116.3 kg)  06/15/18 262 lb (118.8 kg)     Objective:    Vital Signs:  BP 122/69   Ht 6\' 3"  (1.905 m)   Wt 250 lb (113.4 kg)   BMI 31.25 kg/m    ASSESSMENT & PLAN:    1.  OSA -  The patient is tolerating PAP therapy well without any problems. The PAP download was reviewed today and showed an AHI of 0.2/hr on 14 cm H2O with 70% compliance in using more than 4 hours nightly.  The patient has been using and benefiting from PAP use and will continue to benefit from therapy.   2.  HTN -BP controlled -continue Lisinopril 20mg  daily, amlodipine 5mg  daily and Toprol XL 25mg  daily  3.  Obesity -I have encouraged him to get into a routine exercise program and cut back on carbs and portions.   COVID-19 Education: The signs and symptoms of COVID-19 were discussed with the patient and how to seek care for testing (follow up with PCP or arrange E-visit).  The importance of social distancing was discussed today.  Patient Risk:   After full review of this patient's clinical status, I feel that they are at least moderate risk at this time.  Time:   Today, I have spent 20 minutes directly with the patient on telemedicine discussing medical problems including OSA, HTN.  We also reviewed the symptoms of COVID 19  and the ways to protect against contracting the virus with telehealth technology.  I spent an additional 5 minutes reviewing patient's chart including PAP compliance download.  Medication Adjustments/Labs and Tests Ordered: Current medicines are reviewed at length with the patient today.  Concerns regarding medicines are outlined above.  Tests Ordered: No orders of the defined types were placed in this encounter.  Medication Changes: No orders of the defined types were placed in this encounter.   Disposition:  Follow up in 1 year(s)  Signed, Fransico Him, MD  10/19/2018 8:53 AM    [facility]

## 2018-10-19 NOTE — Patient Instructions (Signed)
Medication Instructions:   Your physician recommends that you continue on your current medications as directed. Please refer to the Current Medication list given to you today.  If you need a refill on your cardiac medications before your next appointment, please call your pharmacy.    Follow-Up: At [facility], you and your health needs are our priority.  As part of our continuing mission to provide you with exceptional heart care, we have created designated Provider Care Teams.  These Care Teams include your primary Cardiologist (physician) and Advanced Practice Providers (APPs -  Physician Assistants and Nurse Practitioners) who all work together to provide you with the care you need, when you need it.  Your physician wants you to follow-up in: ONE YEAR WITH DR. TURNER You will receive a reminder letter in the mail two months in advance. If you don't receive a letter, please call our office to schedule the follow-up appointment.      

## 2018-10-25 DIAGNOSIS — L820 Inflamed seborrheic keratosis: Secondary | ICD-10-CM | POA: Diagnosis not present

## 2018-10-25 DIAGNOSIS — L812 Freckles: Secondary | ICD-10-CM | POA: Diagnosis not present

## 2018-10-25 DIAGNOSIS — L565 Disseminated superficial actinic porokeratosis (DSAP): Secondary | ICD-10-CM | POA: Diagnosis not present

## 2018-10-25 DIAGNOSIS — L570 Actinic keratosis: Secondary | ICD-10-CM | POA: Diagnosis not present

## 2018-10-25 DIAGNOSIS — D1801 Hemangioma of skin and subcutaneous tissue: Secondary | ICD-10-CM | POA: Diagnosis not present

## 2018-10-25 DIAGNOSIS — L111 Transient acantholytic dermatosis [Grover]: Secondary | ICD-10-CM | POA: Diagnosis not present

## 2018-10-29 DIAGNOSIS — G4733 Obstructive sleep apnea (adult) (pediatric): Secondary | ICD-10-CM | POA: Diagnosis not present

## 2018-11-07 DIAGNOSIS — N401 Enlarged prostate with lower urinary tract symptoms: Secondary | ICD-10-CM | POA: Diagnosis not present

## 2018-11-07 DIAGNOSIS — R351 Nocturia: Secondary | ICD-10-CM | POA: Diagnosis not present

## 2018-11-14 DIAGNOSIS — N401 Enlarged prostate with lower urinary tract symptoms: Secondary | ICD-10-CM | POA: Diagnosis not present

## 2018-11-14 DIAGNOSIS — R351 Nocturia: Secondary | ICD-10-CM | POA: Diagnosis not present

## 2018-11-14 DIAGNOSIS — R972 Elevated prostate specific antigen [PSA]: Secondary | ICD-10-CM | POA: Diagnosis not present

## 2018-11-15 ENCOUNTER — Telehealth: Payer: Self-pay | Admitting: *Deleted

## 2018-11-15 ENCOUNTER — Telehealth: Payer: Self-pay | Admitting: Cardiovascular Disease

## 2018-11-15 DIAGNOSIS — M1612 Unilateral primary osteoarthritis, left hip: Secondary | ICD-10-CM | POA: Diagnosis not present

## 2018-11-15 DIAGNOSIS — M25552 Pain in left hip: Secondary | ICD-10-CM | POA: Diagnosis not present

## 2018-11-15 NOTE — Telephone Encounter (Signed)
° ° °  Dr Gae Bon would like to know if he can do a virtual visit on 11/6 instead of in office Please advise

## 2018-11-15 NOTE — Telephone Encounter (Signed)
Clinical pharmacist to review, h/o PAF, recurrent PE, positive lupus anticoagulant

## 2018-11-15 NOTE — Telephone Encounter (Signed)
   [facility] Pre-operative Risk Assessment    Request for surgical clearance:  1. What type of surgery is being performed? PROSTATE BX   2. When is this surgery scheduled? TBD   3. What type of clearance is required (medical clearance vs. Pharmacy clearance to hold med vs. Both)? BOTH  4. Are there any medications that need to be held prior to surgery and how long? XARELTO 2 DAYS PRIOR TO BX   5. Practice name and name of physician performing surgery? [facility]; DR  BELL   6. What is your office phone number [phone]    7.   What is your office fax number [phone]  8.   Anesthesia type (None, local, MAC, general) ? NOT LISTED    Isaiah [name] 11/15/2018, 4:37 PM  _________________________________________________________________   (provider comments below)

## 2018-11-16 ENCOUNTER — Other Ambulatory Visit: Payer: Self-pay

## 2018-11-16 ENCOUNTER — Ambulatory Visit (INDEPENDENT_AMBULATORY_CARE_PROVIDER_SITE_OTHER): Payer: BC Managed Care – PPO | Admitting: Nurse Practitioner

## 2018-11-16 VITALS — BP 128/80 | HR 65 | Ht 75.0 in | Wt 261.0 lb

## 2018-11-16 DIAGNOSIS — E782 Mixed hyperlipidemia: Secondary | ICD-10-CM | POA: Diagnosis not present

## 2018-11-16 DIAGNOSIS — N1831 Chronic kidney disease, stage 3a: Secondary | ICD-10-CM | POA: Diagnosis not present

## 2018-11-16 DIAGNOSIS — I480 Paroxysmal atrial fibrillation: Secondary | ICD-10-CM

## 2018-11-16 DIAGNOSIS — E785 Hyperlipidemia, unspecified: Secondary | ICD-10-CM | POA: Diagnosis not present

## 2018-11-16 DIAGNOSIS — E039 Hypothyroidism, unspecified: Secondary | ICD-10-CM | POA: Diagnosis not present

## 2018-11-16 DIAGNOSIS — G4733 Obstructive sleep apnea (adult) (pediatric): Secondary | ICD-10-CM

## 2018-11-16 DIAGNOSIS — I10 Essential (primary) hypertension: Secondary | ICD-10-CM | POA: Diagnosis not present

## 2018-11-16 DIAGNOSIS — I129 Hypertensive chronic kidney disease with stage 1 through stage 4 chronic kidney disease, or unspecified chronic kidney disease: Secondary | ICD-10-CM | POA: Diagnosis not present

## 2018-11-16 DIAGNOSIS — N183 Chronic kidney disease, stage 3 unspecified: Secondary | ICD-10-CM | POA: Diagnosis not present

## 2018-11-16 NOTE — Telephone Encounter (Signed)
I would agree with holding Xarelto for 2 days prior to prostate bx. Restart as soon as possible once the urologist gives the [facility]

## 2018-11-16 NOTE — Patient Instructions (Signed)
Medication Instructions:  Your physician recommends that you continue on your current medications as directed. Please refer to the Current Medication list given to you today.  *If you need a refill on your cardiac medications before your next appointment, please call your pharmacy*   Lab Work: None Ordered    Testing/Procedures: None Ordered   Follow-Up: At [facility], you and your health needs are our priority.  As part of our continuing mission to provide you with exceptional heart care, we have created designated Provider Care Teams.  These Care Teams include your primary Cardiologist (physician) and Advanced Practice Providers (APPs -  Physician Assistants and Nurse Practitioners) who all work together to provide you with the care you need, when you need it.  Your next appointment:   Friday Nov. 6  The format for your next appointment:   Virtual Visit   Provider:   Mertie Moores, MD

## 2018-11-16 NOTE — Telephone Encounter (Signed)
Patient with diagnosis of lupus anticoagulant, recurrent DVT/PE and afib on Xarelto for anticoagulation.    Procedure: PROSTATE BX  Date of procedure: TBD  CHADS2-VASc score of  5 (CHF, HTN, DVT/PE x 2, CAD)  CrCl 70 ml/min  Patient is high risk off anticoagulation due to lupus anticoagulant and hx of multiple DVT/PE. Prostate biopsy is a higher bleed risk procedure. Policy would be to hold for 2 days prior. Due to high risk off anticoagulation, I will defer to Dr. Acie Fredrickson.

## 2018-11-16 NOTE — Progress Notes (Signed)
1.) Reason for visit: EKG  2.) Name of MD requesting visit: Dr. Acie Fredrickson  3.) H&P:  Patient presents for 12 lead ekg due to his recent visits with Dr. Acie Fredrickson being virtual  4.) ROS related to problem: He reports no concerns  5.) Assessment and plan per MD: Virtual visit planned for Friday Nov. 6 with Dr. Acie Fredrickson

## 2018-11-16 NOTE — Telephone Encounter (Signed)
Spoke with patient and advised that we would prefer to do an in person visit since his last ECG was May 2019. He asks if he can come by the office today and have ECG and then change appointment to virtual. I scheduled him accordingly and he thanked me for the call.

## 2018-11-20 NOTE — Telephone Encounter (Signed)
   Primary Cardiologist: Mertie Moores, MD  Chart reviewed as part of pre-operative protocol coverage. Given past medical history and time since last visit, based on ACC/AHA guidelines, JAISHON KRISHER, [name] would be at acceptable risk for the planned procedure without further cardiovascular testing.   Per pharmacy, patient with diagnosis of lupus anticoagulant, recurrent DVT/PE and afib on Xarelto for anticoagulation. CHADS2-VASc score of  5 (CHF, HTN, DVT/PE x 2, CAD) and CrCl 70 ml/min  Patient is high risk off anticoagulation due to lupus anticoagulant and hx of multiple DVT/PE. Prostate biopsy is a higher bleed risk procedure. Policy would be to hold for 2 days prior. Due to high risk off anticoagulation, recommendations were deferred to Dr. Acie Fredrickson who states that patient can proceed with procedure, holding Xarelto 2 days prior to biopsy then resume as soon as possible once cleared from Urologist perspective.   I will route this recommendation to the requesting party via Epic fax function and remove from pre-op pool.  Please call with questions.  [name], NP 11/20/2018, 8:23 AM

## 2018-11-22 NOTE — Progress Notes (Signed)
Virtual Visit via Video Note   This visit type was conducted due to national recommendations for restrictions regarding the COVID-19 Pandemic (e.g. social distancing) in an effort to limit this patient's exposure and mitigate transmission in our community.  Due to his co-morbid illnesses, this patient is at least at moderate risk for complications without adequate follow up.  This format is felt to be most appropriate for this patient at this time.  All issues noted in this document were discussed and addressed.  A limited physical exam was performed with this format.  Please refer to the patient's chart for his consent to telehealth for [facility].   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  He is seen today by virtual visit.  Sees a nephrologist.   Creatinine is a bit elevated  Kidney issues likely related to motrin No cardiac symptoms. Exercising regulalry  Limited by some arthritis    Needs to have a prostate bx next Friday Will hold Xarelto for 2 days prior to procedre ( wed  And tnhursday night.   Plan to restart on sat   The patient does not have symptoms concerning for COVID-19 infection (fever, chills, cough, or new shortness of breath).    Past Medical History:  Diagnosis Date  . Blood dyscrasia    lupus anticoagulant  . Diastolic dysfunction    Grade I per echo in 2/12  . DVT (deep venous thrombosis) ([place]) 2000's   "both legs in the calves; left went all the way up to my groin" (09/17/2012)  . Dysrhythmia   . GERD  (gastroesophageal reflux disease)   . History of bronchitis   . Hypercoagulation syndrome (HCC)    secondary to circulating lupus anticoagulant  . Hyperlipidemia   . Hypertension   . Hypothyroidism    "wiped out from taking amiodarone" (09/17/2012)  . Lyme disease    history of   . Obesity (BMI 30-39.9) 07/17/2014  . PAF (paroxysmal atrial fibrillation) ([place])    no recurrence since Feb 2012  . PONV (postoperative nausea and vomiting)    likes scopolomanine patch  . Pulmonary embolism ([place]) 09/17/2012   "just today; 3 on the right; 2 on the left" (09/17/2012)  . Situational stress   . Sleep apnea    uses CPAP   Past Surgical History:  Procedure Laterality Date  . CARDIOVASCULAR STRESS TEST  07/17/2009   EF 59%  . CARDIOVERSION    . DISTAL BICEPS TENDON REPAIR Right 2011  . INGUINAL HERNIA REPAIR Right 1992  . INGUINAL HERNIA REPAIR Left 2013  . INSERTION OF MESH N/A 05/14/2015   Procedure: INSERTION OF MESH;  Surgeon: Jackolyn Confer, MD;  Location: [facility];  Service: General;  Laterality: N/A;  . SHOULDER ARTHROSCOPY W/ ROTATOR CUFF REPAIR Left 09/04/2012  . SHOULDER SURGERY     left  . TOTAL HIP ARTHROPLASTY Right 11/26/2014   Procedure: RIGHT TOTAL HIP ARTHROPLASTY ANTERIOR APPROACH;  Surgeon: Gaynelle Arabian, MD;  Location: [facility];  Service: Orthopedics;  Laterality: Right;  . TRANSTHORACIC ECHOCARDIOGRAM  03/05/2010  EF 60-65%  . UMBILICAL HERNIA REPAIR N/A 05/14/2015   Procedure: HERNIA REPAIR UMBILICAL ADULT WITH MESH ;  Surgeon: Avel Peaceodd Rosenbower, MD;  Location: [facility];  Service: General;  Laterality: N/A;  . WISDOM TOOTH EXTRACTION     lower 2     No outpatient medications have been marked as taking for the 11/23/18 encounter (Appointment) with Evin Loiseau, Deloris PingPhilip J, MD.     Allergies:   Crestor [rosuvastatin calcium] and Lipitor [atorvastatin calcium]   Social History   Tobacco Use  . Smoking status: Never Smoker  . Smokeless tobacco: Never Used  Substance Use Topics  .  Alcohol use: Yes    Alcohol/week: 8.0 standard drinks    Types: 4 Cans of beer, 4 Shots of liquor per week    Comment: 09/17/2012 "1-2 beers and 1-2 tequila on Fri and Sat nights"  . Drug use: No     Family Hx: The patient's family history includes Cancer in his brother and mother; Heart attack in his maternal grandfather; Hyperlipidemia in his mother; Hypertension in his brother, father, and mother.  ROS:   Please see the history of present illness.     All other systems reviewed and are negative.   Prior CV studies:   The following studies were reviewed today:    Labs/Other Tests and Data Reviewed:    EKG:   Oct. 30, 2020 .  NSR at 65  .  Normal ECG    Recent Labs: 10/12/2018: ALT 25; BUN 26; Creatinine, Ser 1.62; Hemoglobin 15.7; Platelets 236; Potassium 5.2; Sodium 138   Recent Lipid Panel Lab Results  Component Value Date/Time   CHOL 103 10/12/2018 09:27 AM   TRIG 55 10/12/2018 09:27 AM   HDL 44 10/12/2018 09:27 AM   CHOLHDL 2.3 10/12/2018 09:27 AM   CHOLHDL 4.9 12/18/2015 09:15 AM   LDLCALC 46 10/12/2018 09:27 AM    Wt Readings from Last 3 Encounters:  11/16/18 261 lb (118.4 kg)  10/19/18 250 lb (113.4 kg)  10/09/18 256 lb 8 oz (116.3 kg)     Objective:    Vital Signs:  There were no vitals taken for this visit.   VITAL SIGNS:  reviewed GEN:  no acute distress EYES:  sclerae anicteric, EOMI - Extraocular Movements Intact RESPIRATORY:  normal respiratory effort, symmetric expansion CARDIOVASCULAR:  no peripheral edema SKIN:  no rash, lesions or ulcers. MUSCULOSKELETAL:  no obvious deformities. NEURO:  alert and oriented x 3, no obvious focal deficit PSYCH:  normal affect  ASSESSMENT & PLAN:    1. PAF:  Doing well.   Remains in NSR . Cont. propafanone  Cont xarelto   2.  Hypercoagulability.  Patient has a history of recurrent pulmonary emboli.  He is on Xarelto. He needs to have a prostate biopsy.  I given the okay for him to hold his Xarelto for 2  days prior to the biopsy and for 1 day after the biopsy.  He will hold the Xarelto on Wednesday and Thursday nights and restarted on Saturday evening.  See him in 4 months for follow-up office visit.  COVID-19 Education: The signs and symptoms of COVID-19 were discussed with the patient and how to seek care for testing (follow up with PCP or arrange E-visit).  The importance of social distancing was discussed today.  Time:   Today, I have spent  18  minutes with the patient with telehealth technology discussing the above problems.     Medication Adjustments/Labs and Tests Ordered: Current medicines  are reviewed at length with the patient today.  Concerns regarding medicines are outlined above.   Tests Ordered: No orders of the defined types were placed in this encounter.   Medication Changes: No orders of the defined types were placed in this encounter.   Follow Up:  In Person in 4 month(s)  Signed, Kristeen Miss, MD  11/22/2018 9:56 AM    [facility]

## 2018-11-23 ENCOUNTER — Other Ambulatory Visit: Payer: Self-pay

## 2018-11-23 ENCOUNTER — Telehealth (INDEPENDENT_AMBULATORY_CARE_PROVIDER_SITE_OTHER): Payer: BC Managed Care – PPO | Admitting: Cardiovascular Disease

## 2018-11-23 ENCOUNTER — Encounter: Payer: Self-pay | Admitting: Cardiovascular Disease

## 2018-11-23 VITALS — BP 126/70 | HR 72

## 2018-11-23 DIAGNOSIS — I2699 Other pulmonary embolism without acute cor pulmonale: Secondary | ICD-10-CM | POA: Diagnosis not present

## 2018-11-23 DIAGNOSIS — I480 Paroxysmal atrial fibrillation: Secondary | ICD-10-CM | POA: Diagnosis not present

## 2018-11-23 NOTE — Patient Instructions (Addendum)
Medication Instructions:  Your physician recommends that you continue on your current medications as directed. Please refer to the Current Medication list given to you today.  Dr. Acie Fredrickson has advised that you may hold Xarelto for 2 days prior to your biopsy and restart it the day after *If you need a refill on your cardiac medications before your next appointment, please call your pharmacy*  Lab Work: None Ordered    Testing/Procedures: None Ordered   Follow-Up: At [facility], you and your health needs are our priority.  As part of our continuing mission to provide you with exceptional heart care, we have created designated Provider Care Teams.  These Care Teams include your primary Cardiologist (physician) and Advanced Practice Providers (APPs -  Physician Assistants and Nurse Practitioners) who all work together to provide you with the care you need, when you need it.  Your next appointment:   4 months on Friday March 12 at 8:00 am  The format for your next appointment:   In Person  Provider:   You may see Mertie Moores, MD or one of the following Advanced Practice Providers on your designated Care Team:    Richardson Dopp, PA-C  [place], [place]  Daune Perch, [place]

## 2018-11-30 DIAGNOSIS — R972 Elevated prostate specific antigen [PSA]: Secondary | ICD-10-CM | POA: Diagnosis not present

## 2018-11-30 DIAGNOSIS — Z8042 Family history of malignant neoplasm of prostate: Secondary | ICD-10-CM | POA: Diagnosis not present

## 2018-12-10 ENCOUNTER — Other Ambulatory Visit: Payer: Self-pay | Admitting: Family Medicine

## 2018-12-10 DIAGNOSIS — N183 Chronic kidney disease, stage 3 unspecified: Secondary | ICD-10-CM

## 2018-12-10 DIAGNOSIS — I10 Essential (primary) hypertension: Secondary | ICD-10-CM

## 2018-12-28 DIAGNOSIS — G4733 Obstructive sleep apnea (adult) (pediatric): Secondary | ICD-10-CM | POA: Diagnosis not present

## 2018-12-31 ENCOUNTER — Telehealth: Payer: Self-pay

## 2018-12-31 NOTE — Telephone Encounter (Signed)
Patient with diagnosis of lupus anticoagulant, recurrent DVT/PE and atrial fibrillation on Xarelto for anticoagulation.    Procedure: Left total hip arthroplasty  Date of procedure: 02/20/19  CHADS2-VASc score of 5 (CHF, HTN, DVT/PE x 2, CAD)  CrCl ~70 mL/min  Patient is high risk off anticoagulation due to lupus anticoagulant and hx of multiple DVT/PE. Left THA is a higher bleed risk procedure. Protocol would be to hold for 3 days prior as these procedures are now majority spinal anesthesia. Due to higher risk off anticoagulation, will defer to Dr. Acie Fredrickson.  For orthopedic procedures please be sure to resume therapeutic (not prophylactic) dosing.

## 2018-12-31 NOTE — Telephone Encounter (Signed)
   [facility] Pre-operative Risk Assessment    Request for surgical clearance:  1. What type of surgery is being performed? Left total hip arthroplasty   2. When is this surgery scheduled? 02/20/19   3. What type of clearance is required (medical clearance vs. Pharmacy clearance to hold med vs. Both)? Pharmacy  4. Are there any medications that need to be held prior to surgery and how long? Xarelto   5. Practice name and name of physician performing surgery? [facility]/Frank Aluisio, MD   6. What is your office phone number [phone]    7.   What is your office fax number [phone]  8.   Anesthesia type (None, local, MAC, general) ? Choice   Mady Haagensen 12/31/2018, 8:41 AM  _________________________________________________________________   (provider comments below)

## 2018-12-31 NOTE — Telephone Encounter (Signed)
The safest plan would be to have him take Lovenox for the days he is off the Xarelto .  Will defer to Pharmacists to give specifics for Lovenox bridging . Agree that he needs full anticoagulation after his surgery - not just the DVT prophylaxis  doses

## 2019-01-01 NOTE — Telephone Encounter (Signed)
Spoke with patient. He states that he only held xarelto one day last time they did his other hip. As long as we can confirm that they will not be using spinal anesethisa and surgeon is ok with a 1 day hold, then this will be fine to avoid having to do a lovenox bridge. Will reach out to Dr. Wynelle Link office.

## 2019-01-01 NOTE — Telephone Encounter (Signed)
I have called and left a voicemail for patient to call back  Will need to set up an appointment for patient to come in to review lovenox bridge for surgery in feb. Patient on Xarelto and will need bridge.

## 2019-01-02 NOTE — Telephone Encounter (Signed)
Voicemail from Dr. Wynelle Link staff stating that patient could have general ansesthia and hold anticoagulation one day or he could have spinal anesesthia and hold anticoagulation 2-3 days (from our standpoint it would be 3 with a lovenox bridge). I spoke with patient who states he would like to have general anesthesia and hold just one day therefore not needing lovenox bridge.  I have called Dr. Wynelle Link office and spoke with [facility] and advised of patients preference.  In summary patient may hold Xarelto 1 day prior to procedure as long as general anesesthia is used.

## 2019-01-02 NOTE — Telephone Encounter (Signed)
   Primary Cardiologist: Mertie Moores, MD  Chart reviewed as part of pre-operative protocol coverage. 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   Patient with diagnosis of lupus anticoagulant, recurrent DVT/PE and atrial fibrillationonXareltofor anticoagulation.   Procedure: Left total hip arthroplasty Date of procedure: 02/20/19  CHADS2-VASc score of 5(CHF, HTN,DVT/PEx 2, CAD)  Patient is high risk off anticoagulation due to lupus anticoagulant and hx of multiple DVT/PE. Left THA is a higher bleed risk procedure.   Voicemail from Dr. Wynelle Link staff stating that patient could have general ansesthia and hold anticoagulation one day or he could have spinal anesesthia and hold anticoagulation 2-3 days (from our standpoint it would be 3 with a lovenox bridge). Pharmacy spoke with patient who states he would like to have general anesthesia and hold just one day therefore not needing lovenox bridge.   Dr. Anne Fu  office contacted and pharmacy spoke with [name] and advised of patients preference.  In summary patient may hold Xarelto 1 day prior to procedure as long as general anesesthia is used.  For orthopedic procedures please be sure to resume therapeutic (not prophylactic) dosing.   I will route this recommendation to the requesting party via Epic fax function and remove from pre-op pool.  Please call with questions.  [name], NP 01/02/2019, 1:02 PM

## 2019-01-04 NOTE — Telephone Encounter (Signed)
I agree with the note by Esther Hardy

## 2019-01-16 ENCOUNTER — Other Ambulatory Visit: Payer: Self-pay | Admitting: Family Medicine

## 2019-01-25 ENCOUNTER — Ambulatory Visit: Payer: BC Managed Care – PPO | Admitting: Family Medicine

## 2019-01-25 ENCOUNTER — Other Ambulatory Visit: Payer: Self-pay

## 2019-01-25 ENCOUNTER — Other Ambulatory Visit: Payer: BC Managed Care – PPO

## 2019-01-25 DIAGNOSIS — E785 Hyperlipidemia, unspecified: Secondary | ICD-10-CM

## 2019-01-25 DIAGNOSIS — I10 Essential (primary) hypertension: Secondary | ICD-10-CM | POA: Diagnosis not present

## 2019-01-25 DIAGNOSIS — E039 Hypothyroidism, unspecified: Secondary | ICD-10-CM

## 2019-01-25 DIAGNOSIS — Z79899 Other long term (current) drug therapy: Secondary | ICD-10-CM | POA: Diagnosis not present

## 2019-01-25 DIAGNOSIS — N183 Chronic kidney disease, stage 3 unspecified: Secondary | ICD-10-CM | POA: Diagnosis not present

## 2019-01-25 DIAGNOSIS — Z0000 Encounter for general adult medical examination without abnormal findings: Secondary | ICD-10-CM

## 2019-01-25 NOTE — H&P (Signed)
TOTAL HIP ADMISSION H&P  Patient is admitted for left total hip arthroplasty.  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  Onset of symptoms was gradual starting several years ago with gradually worsening course since that time.The patient noted no past surgery on the left hip(s).  Patient currently rates pain in the left hip at 7 out of 10 with activity. Patient has worsening of pain with activity and weight bearing, pain that interfers with activities of daily living and crepitus. Patient has evidence of advanced osteoarthritis in the left hip though not yet bone-on-bone by imaging studies. This condition presents safety issues increasing the risk of falls. There is no current active infection.  Patient Active Problem List   Diagnosis Date Noted  . Chronic kidney disease (CKD), stage III (moderate) 10/09/2018  . Verruca pedis 12/11/2017  . Essential hypertension 12/11/2017  . Gastroesophageal reflux disease 12/11/2017  . Pain in right knee 07/28/2017  . Fever 02/20/2017  . Sessile colonic polyp- descending colon 09/28/2016  . Hypercoagulopathy (HCC) 08/05/2016  . Seborrheic dermatitis of scalp 10/01/2015  . Osteoarthritis of multiple joints 08/27/2015  . Chronic anticoagulation- abnormal lupus anticoagulant 08/27/2015  . Personal history of multiple DVTs (deep vein thrombosis) 08/27/2015  . Other reactions to severe stress 08/27/2015  . CPAP (continuous positive airway pressure) dependence 08/27/2015  . GERD (gastroesophageal reflux disease) 08/27/2015  . h/o Lyme disease- 2014 tick bite 08/27/2015  . Memory changes- since Lyme Dx 08/27/2015  . Obesity (BMI 30-39.9) 07/17/2014  . OSA (obstructive sleep apnea) 05/02/2014  . Dyslipidemia  05/10/2013  . History of chronic Myalgias since Lyme Dx 2014 05/10/2013  . Cough 09/28/2012  . h/o Pulmonary embolism and infarction (HCC) 09/17/2012  . Chest pain 01/27/2012  . Hypothyroidism- followed by Dr. Adrian Prince of Endo 12/17/2010  . HTN (hypertension) 10/01/2010  . PAF (paroxysmal atrial fibrillation) (HCC) 10/01/2010  . High risk medication use 10/01/2010   Past Medical History:  Diagnosis Date  . Blood dyscrasia    lupus anticoagulant  . Diastolic dysfunction    Grade I per echo in 2/12  . DVT (deep venous thrombosis) (HCC) 2000's   "both legs in the calves; left went all the way up to my groin" (09/17/2012)  . Dysrhythmia   . GERD (gastroesophageal reflux disease)   . History of bronchitis   . Hypercoagulation syndrome (HCC)    secondary to circulating lupus anticoagulant  . Hyperlipidemia   . Hypertension   . Hypothyroidism    "wiped out from taking amiodarone" (09/17/2012)  . Lyme disease    history of   . Obesity (BMI 30-39.9) 07/17/2014  . PAF (paroxysmal atrial fibrillation) (HCC)    no recurrence since Feb 2012  . PONV (postoperative nausea and vomiting)    likes scopolomanine patch  . Pulmonary embolism (HCC) 09/17/2012   "just today; 3 on the right; 2 on the left" (09/17/2012)  . Situational stress   . Sleep apnea    uses CPAP    Past Surgical History:  Procedure Laterality Date  . CARDIOVASCULAR STRESS TEST  07/17/2009   EF 59%  . CARDIOVERSION    . DISTAL BICEPS TENDON REPAIR Right 2011  . INGUINAL HERNIA REPAIR Right 1992  . INGUINAL HERNIA REPAIR Left 2013  .  INSERTION OF MESH N/A 05/14/2015   Procedure: INSERTION OF MESH;  Surgeon: Jackolyn Confer, MD;  Location: [facility];  Service: General;  Laterality: N/A;  . SHOULDER ARTHROSCOPY W/ ROTATOR CUFF REPAIR Left 09/04/2012  . SHOULDER SURGERY     left  . TOTAL HIP ARTHROPLASTY Right 11/26/2014   Procedure: RIGHT TOTAL HIP ARTHROPLASTY ANTERIOR APPROACH;  Surgeon: Gaynelle Arabian, MD;  Location: [facility];   Service: Orthopedics;  Laterality: Right;  . TRANSTHORACIC ECHOCARDIOGRAM  03/05/2010   EF 60-65%  . UMBILICAL HERNIA REPAIR N/A 05/14/2015   Procedure: HERNIA REPAIR UMBILICAL ADULT WITH MESH ;  Surgeon: Jackolyn Confer, MD;  Location: [facility];  Service: General;  Laterality: N/A;  . WISDOM TOOTH EXTRACTION     lower 2    No current facility-administered medications for this encounter.   Current Outpatient Medications  Medication Sig Dispense Refill Last Dose  . ALPRAZolam (XANAX) 0.25 MG tablet Take 1 tablet by mouth every 4 (four) hours as needed.  4   . amLODipine (NORVASC) 5 MG tablet TAKE 1 TABLET BY MOUTH DAILY 90 tablet 3   . ezetimibe (ZETIA) 10 MG tablet TAKE 1 TABLET BY MOUTH DAILY 90 tablet 1   . lisinopril (ZESTRIL) 20 MG tablet Take 20 mg by mouth 2 (two) times daily.     . metoprolol succinate (TOPROL-XL) 25 MG 24 hr tablet TAKE 1 TABLET BY MOUTH EVERY MORNING 90 tablet 2   . omega-3 acid ethyl esters (LOVAZA) 1 g capsule TAKE 1 CAPSULE BY MOUTH TWICE DAILY 180 capsule 2   . omeprazole (PRILOSEC) 20 MG capsule Take 1 capsule (20 mg total) by mouth daily. 30 capsule 0   . propafenone (RYTHMOL SR) 325 MG 12 hr capsule TAKE 1 CAPSULE BY MOUTH TWICE DAILY 180 capsule 3   . REPATHA SURECLICK 751 MG/ML SOAJ INJECT 1 PEN INTO THE SKIN EVERY 14 DAYS 2 pen 11   . SYNTHROID 150 MCG tablet Take 300 mcg by mouth daily.   0   . XARELTO 20 MG TABS tablet TAKE 1 TABLET BY MOUTH DAILY 90 tablet 1    Allergies  Allergen Reactions  . Crestor [Rosuvastatin Calcium] Other (See Comments)    hepatitis  . Lipitor [Atorvastatin Calcium] Other (See Comments)    Feels like someone hit him with 2x4    Social History   Tobacco Use  . Smoking status: Never Smoker  . Smokeless tobacco: Never Used  Substance Use Topics  . Alcohol use: Yes    Alcohol/week: 8.0 standard drinks    Types: 4 Cans of beer, 4 Shots of liquor per week    Comment: 09/17/2012 "1-2 beers and 1-2 tequila on Fri and Sat  nights"    Family History  Problem Relation Age of Onset  . Hypertension Mother   . Hyperlipidemia Mother   . Cancer Mother        breast  . Hypertension Father   . Cancer Brother        prostate  . Hypertension Brother   . Heart attack Maternal Grandfather      Review of Systems  Constitutional: Negative for chills and fever.  HENT: Negative for congestion, sore throat and tinnitus.   Eyes: Negative for photophobia and pain.  Respiratory: Negative for cough, shortness of breath and wheezing.   Cardiovascular: Negative for chest pain and palpitations.  Gastrointestinal: Negative for nausea and vomiting.  Genitourinary: Negative for dysuria, frequency and urgency.  Neurological: Negative for dizziness, weakness and headaches.  Objective:  Physical Exam  Well nourished and well developed.  General: Alert and oriented x3, cooperative and pleasant, no acute distress.  Head: normocephalic, atraumatic, neck supple.  Eyes: EOMI.  Respiratory: breath sounds clear in all fields, no wheezing, rales, or rhonchi. Cardiovascular: Regular rate and rhythm, no murmurs, gallops or rubs.  Abdomen: non-tender to palpation and soft, normoactive bowel sounds. Musculoskeletal:  Left hip shows full extension with further flexion 210 external rotation of 20 and internal rotation of 15 with pain in the groin.  Calves soft and nontender. Motor function intact in LE. Strength 5/5 LE bilaterally. Neuro: Distal pulses 2+. Sensation to light touch intact in LE.  Labs:   Estimated body mass index is 32.62 kg/m as calculated from the following:   Height as of 11/16/18: 6\' 3"  (1.905 m).   Weight as of 11/16/18: 118.4 kg.   Imaging Review Plain radiographs demonstrate severe degenerative joint disease of the left hip(s). The bone quality appears to be adequate for age and reported activity level.  Assessment/Plan:  End stage arthritis, left hip(s)  The patient history, physical  examination, clinical judgement of the provider and imaging studies are consistent with end stage degenerative joint disease of the left hip(s) and total hip arthroplasty is deemed medically necessary. The treatment options including medical management, injection therapy, arthroscopy and arthroplasty were discussed at length. The risks and benefits of total hip arthroplasty were presented and reviewed. The risks due to aseptic loosening, infection, stiffness, dislocation/subluxation,  thromboembolic complications and other imponderables were discussed.  The patient acknowledged the explanation, agreed to proceed with the plan and consent was signed. Patient is being admitted for inpatient treatment for surgery, pain control, PT, OT, prophylactic antibiotics, VTE prophylaxis, progressive ambulation and ADL's and discharge planning.The patient is planning to be discharged home.   Anticipated LOS equal to or greater than 2 midnights due to - Age 55 and older with one or more of the following:  - Obesity  - Expected need for hospital services (PT, OT, Nursing) required for safe  discharge  - Anticipated need for postoperative skilled nursing care or inpatient rehab  - Active co-morbidities: DVT/VTE and Cardiac Arrhythmia OR   - Unanticipated findings during/Post Surgery: None  - Patient is a high risk of re-admission due to: None  Therapy Plans: HEP Disposition: Home with girlfriend Planned DVT Prophylaxis: Xarelto 20 mg daily DME needed: None PCP: Dr. 78 Cardiologist: Dr. Evlyn Kanner TXA: Topical (hx DVT/PE) Allergies: Crestor, lipitor Anesthesia Concerns: Nausea BMI: 30.7  - Patient was instructed on what medications to stop prior to surgery. - Follow-up visit in 2 weeks with Dr. Elease Hashimoto - Begin physical therapy following surgery - Pre-operative lab work as pre-surgical testing - Prescriptions will be provided in hospital at time of discharge  [name], PA-C Orthopedic  Surgery [facility] [place]

## 2019-01-26 ENCOUNTER — Other Ambulatory Visit (HOSPITAL_COMMUNITY)
Admission: RE | Admit: 2019-01-26 | Discharge: 2019-01-26 | Disposition: A | Discharge status: Home / Self Care | Payer: BC Managed Care – PPO | Source: Ambulatory Visit | Attending: Orthopedic Surgery | Admitting: Orthopedic Surgery

## 2019-01-26 DIAGNOSIS — Z20822 Contact with and (suspected) exposure to covid-19: Secondary | ICD-10-CM | POA: Diagnosis not present

## 2019-01-26 LAB — COMPREHENSIVE METABOLIC PANEL
ALT: 107 IU/L — ABNORMAL HIGH (ref 0–44)
AST: 50 IU/L — ABNORMAL HIGH (ref 0–40)
Albumin/Globulin Ratio: 1.6 (ref 1.2–2.2)
Albumin: 4.2 g/dL (ref 3.8–4.9)
Alkaline Phosphatase: 122 IU/L — ABNORMAL HIGH (ref 39–117)
BUN/Creatinine Ratio: 21 — ABNORMAL HIGH (ref 9–20)
BUN: 29 mg/dL — ABNORMAL HIGH (ref 6–24)
Bilirubin Total: 0.5 mg/dL (ref 0.0–1.2)
CO2: 22 mmol/L (ref 20–29)
Calcium: 9.6 mg/dL (ref 8.7–10.2)
Chloride: 103 mmol/L (ref 96–106)
Creatinine, Ser: 1.41 mg/dL — ABNORMAL HIGH (ref 0.76–1.27)
GFR calc Af Amer: 64 mL/min/{1.73_m2} (ref >59)
GFR calc non Af Amer: 55 mL/min/{1.73_m2} — ABNORMAL LOW (ref >59)
Globulin, Total: 2.6 g/dL (ref 1.5–4.5)
Glucose: 107 mg/dL — ABNORMAL HIGH (ref 65–99)
Potassium: 4.5 mmol/L (ref 3.5–5.2)
Sodium: 140 mmol/L (ref 134–144)
Total Protein: 6.8 g/dL (ref 6.0–8.5)

## 2019-01-26 LAB — LIPID PANEL
Chol/HDL Ratio: 3 ratio (ref 0.0–5.0)
Cholesterol, Total: 134 mg/dL (ref 100–199)
HDL: 44 mg/dL (ref >39)
LDL Chol Calc (NIH): 52 mg/dL (ref 0–99)
Triglycerides: 239 mg/dL — ABNORMAL HIGH (ref 0–149)
VLDL Cholesterol Cal: 38 mg/dL (ref 5–40)

## 2019-01-26 LAB — CBC WITH DIFFERENTIAL/PLATELET
Basophils Absolute: 0.1 10*3/uL (ref 0.0–0.2)
Basos: 1 %
EOS (ABSOLUTE): 0.1 10*3/uL (ref 0.0–0.4)
Eos: 2 %
Hematocrit: 46.1 % (ref 37.5–51.0)
Hemoglobin: 15.6 g/dL (ref 13.0–17.7)
Immature Grans (Abs): 0 10*3/uL (ref 0.0–0.1)
Immature Granulocytes: 0 %
Lymphocytes Absolute: 1.5 10*3/uL (ref 0.7–3.1)
Lymphs: 36 %
MCH: 29.8 pg (ref 26.6–33.0)
MCHC: 33.8 g/dL (ref 31.5–35.7)
MCV: 88 fL (ref 79–97)
Monocytes Absolute: 0.5 10*3/uL (ref 0.1–0.9)
Monocytes: 13 %
Neutrophils Absolute: 2.1 10*3/uL (ref 1.4–7.0)
Neutrophils: 48 %
Platelets: 206 10*3/uL (ref 150–450)
RBC: 5.24 x10E6/uL (ref 4.14–5.80)
RDW: 14.6 % (ref 11.6–15.4)
WBC: 4.3 10*3/uL (ref 3.4–10.8)

## 2019-01-26 LAB — T3: T3, Total: 80 ng/dL (ref 71–180)

## 2019-01-26 LAB — VITAMIN D 25 HYDROXY (VIT D DEFICIENCY, FRACTURES): Vit D, 25-Hydroxy: 55.7 ng/mL (ref 30.0–100.0)

## 2019-01-26 LAB — TSH: TSH: 0.944 u[IU]/mL (ref 0.450–4.500)

## 2019-01-26 LAB — HEMOGLOBIN A1C
Est. average glucose Bld gHb Est-mCnc: 123 mg/dL
Hgb A1c MFr Bld: 5.9 % — ABNORMAL HIGH (ref 4.8–5.6)

## 2019-01-26 LAB — T4, FREE: Free T4: 1.72 ng/dL (ref 0.82–1.77)

## 2019-01-27 LAB — NOVEL CORONAVIRUS, NAA (HOSP ORDER, SEND-OUT TO REF LAB; TAT 18-24 HRS): SARS-CoV-2, NAA: NOT DETECTED

## 2019-01-28 ENCOUNTER — Other Ambulatory Visit: Payer: Self-pay | Admitting: Cardiovascular Disease

## 2019-01-28 ENCOUNTER — Other Ambulatory Visit: Payer: Self-pay | Admitting: Family Medicine

## 2019-01-28 ENCOUNTER — Encounter: Payer: Self-pay | Admitting: Family Medicine

## 2019-01-28 NOTE — Patient Instructions (Signed)
DUE TO COVID-19 ONLY ONE VISITOR IS ALLOWED TO COME WITH YOU AND STAY IN THE WAITING ROOM ONLY DURING PRE OP AND PROCEDURE DAY OF SURGERY. THE 1 VISITOR MAY VISIT WITH YOU AFTER SURGERY IN YOUR PRIVATE ROOM DURING VISITING HOURS ONLY!  YOU NEED TO HAVE A COVID 19 TEST ON: 01/26/19 , THIS TEST MUST BE DONE BEFORE SURGERY, COME  [place], [name]  , [address].  ([name]) ONCE YOUR COVID TEST IS COMPLETED, PLEASE BEGIN THE QUARANTINE INSTRUCTIONS AS OUTLINED IN YOUR HANDOUT.                [name], [name]     Your procedure is scheduled on:01/30/2019    Report to [place]  Entrance    Report to admitting at: 12:00 PM     Call this number if you have problems the morning of surgery [phone]     Remember:    [place], NO [name].     Take these medicines the morning of surgery with A SIP OF WATER: amlodipine,metoprolol,ezetimibe,omeprazole,tamsulosin,propafenone.Tylenol,xanax as needed.                                   You may not have any metal on your body including hair pins and              piercings  Do not wear jewelry,lotions, powders or perfumes, deodorant             Men may shave face and neck.     Do not bring valuables to the hospital. [place].  Contacts, dentures or bridgework may not be worn into surgery.  Leave suitcase in the car. After surgery it may be brought to your room.     Patients discharged the day of surgery will not be allowed to drive home. IF YOU ARE HAVING SURGERY AND GOING HOME THE SAME DAY, YOU MUST HAVE AN ADULT TO DRIVE YOU HOME AND  BE WITH YOU FOR 24 HOURS. YOU MAY GO HOME BY TAXI OR UBER OR ORTHERWISE, BUT AN ADULT MUST ACCOMPANY YOU HOME AND STAY WITH YOU FOR 24 HOURS.  Name and phone number of your driver:  Special Instructions: N/A              Please read over the  following fact sheets you were given: _____________________________________________________________________             NO SOLID FOOD AFTER MIDNIGHT THE NIGHT PRIOR TO SURGERY. NOTHING BY MOUTH EXCEPT CLEAR LIQUIDS UNTIL: 11:30 am . PLEASE FINISH ENSURE DRINK PER SURGEON ORDER  WHICH NEEDS TO BE COMPLETED AT:11:30 am .   CLEAR LIQUID DIET   Foods Allowed                                                                     Foods Excluded  Coffee and tea, regular and decaf  liquids that you cannot  Plain Jell-O any favor except red or purple                                           see through such as: Fruit ices (not with fruit pulp)                                     milk, soups, orange juice  Iced Popsicles                                    All solid food Carbonated beverages, regular and diet                                    Cranberry, grape and apple juices Sports drinks like Gatorade Lightly seasoned clear broth or consume(fat free) Sugar, honey syrup  Sample Menu Breakfast                                Lunch                                     Supper Cranberry juice                    Beef broth                            Chicken broth Jell-O                                     Grape juice                           Apple juice Coffee or tea                        Jell-O                                      Popsicle                                                Coffee or tea                        Coffee or tea  _____________________________________________________________________   [facility] - Preparing for Surgery Before surgery, you can play an important role.  Because skin is not sterile, your skin needs to be as free of germs as possible.  You can reduce the number of germs on your skin by washing with CHG (chlorahexidine gluconate) soap before surgery.  CHG is an antiseptic cleaner which  kills germs and bonds with the skin to continue  killing germs even after washing. Please DO NOT use if you have an allergy to CHG or antibacterial soaps.  If your skin becomes reddened/irritated stop using the CHG and inform your nurse when you arrive at Short Stay. Do not shave (including legs and underarms) for at least 48 hours prior to the first CHG shower.  You may shave your face/neck. Please follow these instructions carefully:  1.  Shower with CHG Soap the night before surgery and the  morning of Surgery.  2.  If you choose to wash your hair, wash your hair first as usual with your  normal  shampoo.  3.  After you shampoo, rinse your hair and body thoroughly to remove the  shampoo.                           4.  Use CHG as you would any other liquid soap.  You can apply chg directly  to the skin and wash                       Gently with a scrungie or clean washcloth.  5.  Apply the CHG Soap to your body ONLY FROM THE NECK DOWN.   Do not use on face/ open                           Wound or open sores. Avoid contact with eyes, ears mouth and genitals (private parts).                       Wash face,  Genitals (private parts) with your normal soap.             6.  Wash thoroughly, paying special attention to the area where your surgery  will be performed.  7.  Thoroughly rinse your body with warm water from the neck down.  8.  DO NOT shower/wash with your normal soap after using and rinsing off  the CHG Soap.                9.  Pat yourself dry with a clean towel.            10.  Wear clean pajamas.            11.  Place clean sheets on your bed the night of your first shower and do not  sleep with pets. Day of Surgery : Do not apply any lotions/deodorants the morning of surgery.  Please wear clean clothes to the hospital/surgery center.  FAILURE TO FOLLOW THESE INSTRUCTIONS MAY RESULT IN THE CANCELLATION OF YOUR SURGERY PATIENT SIGNATURE_________________________________  NURSE  SIGNATURE__________________________________  ________________________________________________________________________    Isaiah [name]  An incentive spirometer is a tool that can help keep your lungs clear and active. This tool measures how well you are filling your lungs with each breath. Taking long deep breaths may help reverse or decrease the chance of developing breathing (pulmonary) problems (especially infection) following:  A long period of time when you are unable to move or be active. BEFORE THE PROCEDURE   If the spirometer includes an indicator to show your best effort, your nurse or respiratory therapist will set it to a desired goal.  If possible, sit up straight or lean slightly forward. Try not to slouch.  Hold the incentive spirometer  in an upright position. INSTRUCTIONS FOR USE  1. Sit on the edge of your bed if possible, or sit up as far as you can in bed or on a chair. 2. Hold the incentive spirometer in an upright position. 3. Breathe out normally. 4. Place the mouthpiece in your mouth and seal your lips tightly around it. 5. Breathe in slowly and as deeply as possible, raising the piston or the ball toward the top of the column. 6. Hold your breath for 3-5 seconds or for as long as possible. Allow the piston or ball to fall to the bottom of the column. 7. Remove the mouthpiece from your mouth and breathe out normally. 8. Rest for a few seconds and repeat Steps 1 through 7 at least 10 times every 1-2 hours when you are awake. Take your time and take a few normal breaths between deep breaths. 9. The spirometer may include an indicator to show your best effort. Use the indicator as a goal to work toward during each repetition. 10. After each set of 10 deep breaths, practice coughing to be sure your lungs are clear. If you have an incision (the cut made at the time of surgery), support your incision when coughing by placing a pillow or rolled up towels firmly  against it. Once you are able to get out of bed, walk around indoors and cough well. You may stop using the incentive spirometer when instructed by your caregiver.  RISKS AND COMPLICATIONS  Take your time so you do not get dizzy or light-headed.  If you are in pain, you may need to take or ask for pain medication before doing incentive spirometry. It is harder to take a deep breath if you are having pain. AFTER USE  Rest and breathe slowly and easily.  It can be helpful to keep track of a log of your progress. Your caregiver can provide you with a simple table to help with this. If you are using the spirometer at home, follow these instructions: [place] IF:   You are having difficultly using the spirometer.  You have trouble using the spirometer as often as instructed.  Your pain medication is not giving enough relief while using the spirometer.  You develop fever of 100.5 F (38.1 C) or higher. SEEK IMMEDIATE MEDICAL CARE IF:   You cough up bloody sputum that had not been present before.  You develop fever of 102 F (38.9 C) or greater.  You develop worsening pain at or near the incision site. MAKE SURE YOU:   Understand these instructions.  Will watch your condition.  Will get help right away if you are not doing well or get worse. Document Released: 05/16/2006 Document Revised: 03/28/2011 Document Reviewed: 07/17/2006 [facility] Patient Information 2014 [place], [place].   ________________________________________________________________________

## 2019-01-29 ENCOUNTER — Encounter (HOSPITAL_COMMUNITY)
Admission: RE | Admit: 2019-01-29 | Discharge: 2019-01-29 | Disposition: A | Discharge status: Home / Self Care | Payer: BC Managed Care – PPO | Source: Ambulatory Visit | Attending: Orthopedic Surgery | Admitting: Orthopedic Surgery

## 2019-01-29 ENCOUNTER — Encounter (HOSPITAL_COMMUNITY): Payer: Self-pay

## 2019-01-29 ENCOUNTER — Other Ambulatory Visit: Payer: Self-pay

## 2019-01-29 DIAGNOSIS — Z01812 Encounter for preprocedural laboratory examination: Secondary | ICD-10-CM | POA: Insufficient documentation

## 2019-01-29 DIAGNOSIS — Z01818 Encounter for other preprocedural examination: Secondary | ICD-10-CM | POA: Insufficient documentation

## 2019-01-29 HISTORY — DX: Unspecified osteoarthritis, unspecified site: M19.90

## 2019-01-29 LAB — PROTIME-INR
INR: 1.1 (ref 0.8–1.2)
Prothrombin Time: 14.2 seconds (ref 11.4–15.2)

## 2019-01-29 LAB — SURGICAL PCR SCREEN
MRSA, PCR: NEGATIVE
Staphylococcus aureus: POSITIVE — AB

## 2019-01-29 LAB — APTT: aPTT: 36 seconds (ref 24–36)

## 2019-01-29 NOTE — Progress Notes (Addendum)
PCP - Clearance: Almira Bar CMA. 12/31/18. EPIC Cardiologist - Kristeen Miss. Tele visit: 11/23/18. EPIC  Chest x-ray -  EKG - 11/16/18. EPIC Stress Test -  ECHO -  Cardiac Cath -   Sleep Study - Yes CPAP - Yes. Pt. Was instructed to bring tubing and mask.  Fasting Blood Sugar -  Checks Blood Sugar _____ times a day  Blood Thinner Instructions: Xarelto. Hold one day before surgery as per Dr. Elease Hashimoto instructions. Pt. stopped today (01/29/19) Aspirin Instructions: Last Dose:  Anesthesia review:   Patient denies shortness of breath, fever, cough and chest pain at PAT appointment   Patient verbalized understanding of instructions that were given to them at the PAT appointment. Patient was also instructed that they will need to review over the PAT instructions again at home before surgery.

## 2019-01-29 NOTE — Progress Notes (Signed)
Anesthesia Chart Review   Case: [id number] Date/Time: 01/30/19 1420   Procedure: TOTAL HIP ARTHROPLASTY ANTERIOR APPROACH (Left Hip) - 11min   Anesthesia type: Choice   Pre-op diagnosis: left hip osteoarthritis   [facility]: [facility] ROOM 10 / [facility]   Surgeons: Gaynelle Arabian, MD      DISCUSSION:59 y.o. never smoker with h/o PONV (pt reports likes scopolamine patch), HTN, PAF (on Xarelto), chronic anticoagulation- abnormal lupus anticoagulant, DVT, PE 2014, HLD, GERD, sleep apnea w/CPAP, CKD Stage III, left hip OA scheduled for above procedure 01/30/2019 with Dr. Gaynelle Arabian.   Pt cleared by cardiology 01/02/2019.  Per [name], NP, "Chart reviewed as part of pre-operative protocol coverage. Given past medical history and time since last visit, based on ACC/AHA guidelines, SHOUA RESSLER, [name] would be at acceptable risk for the planned procedure without further cardiovascular testing.  Patient with diagnosisoflupus anticoagulant, recurrent DVT/PE and atrial fibrillationonXareltofor anticoagulation. Procedure:Left total hip arthroplasty Date of procedure:02/20/19 CHADS2-VASc score of5(CHF, HTN,DVT/PEx 2, CAD) Patient is high risk off anticoagulation due to lupus anticoagulant and hx of multiple DVT/PE.Left THA is a higher bleed risk procedure.  Voicemail from Dr. Wynelle Link staff stating that patient could have general ansesthia and hold anticoagulation one day or he could have spinal anesesthia and hold anticoagulation 2-3 days (from our standpoint it would be 3 with a lovenox bridge). Pharmacy spoke with patient who states he would like to have general anesthesia and hold just one day therefore not needing lovenox bridge.  Dr. Anne Fu  office contacted and pharmacy spoke with Faith and advised of patients preference. In summary patient may hold Xarelto 1 day prior to procedure as long as general anesesthia is used."  Anticipate pt can proceed with planned procedure barring acute  status change.   VS: There were no vitals taken for this visit.  PROVIDERSMellody Dance, DO is PCP last seen 10/09/2018, stable at this visit.    Mertie Moores, MD is Cardiologist  LABS: Labs reviewed: Acceptable for surgery. (all labs ordered are listed, but only abnormal results are displayed)  Labs Reviewed - No data to display   IMAGES:   EKG: 11/16/2018 Rate 65 NSR  CV: Echo 08/12/2016 Study Conclusions  - Left ventricle: The cavity size was mildly dilated. Systolic   function was normal. The estimated ejection fraction was in the   range of 55% to 60%. Wall motion was normal; there were no   regional wall motion abnormalities. There was an increased   relative contribution of atrial contraction to ventricular   filling. Doppler parameters are consistent with abnormal left   ventricular relaxation (grade 1 diastolic dysfunction). - Aorta: Aortic root dimension: 39 mm (ED). - Aortic root: The aortic root was mildly dilated. - Mitral valve: There was mild regurgitation. - Right ventricle: The cavity size was mildly dilated. Wall   thickness was normal. - Tricuspid valve: There was trivial regurgitation. Past Medical History:  Diagnosis Date  . Arthritis   . Blood dyscrasia    lupus anticoagulant  . Diastolic dysfunction    Grade I per echo in 2/12  . DVT (deep venous thrombosis) ([facility]) 2000's   "both legs in the calves; left went all the way up to my groin" (09/17/2012)  . Dysrhythmia   . GERD (gastroesophageal reflux disease)   . History of bronchitis   . Hypercoagulation syndrome (HCC)    secondary to circulating lupus anticoagulant  . Hyperlipidemia   . Hypertension   . Hypothyroidism    "  wiped out from taking amiodarone" (09/17/2012)  . Lyme disease    history of   . Obesity (BMI 30-39.9) 07/17/2014  . PAF (paroxysmal atrial fibrillation) (HCC)    no recurrence since Feb 2012  . PONV (postoperative nausea and vomiting)    likes scopolomanine patch   . Pulmonary embolism (HCC) 09/17/2012   "just today; 3 on the right; 2 on the left" (09/17/2012)  . Situational stress   . Sleep apnea    uses CPAP    Past Surgical History:  Procedure Laterality Date  . CARDIOVASCULAR STRESS TEST  07/17/2009   EF 59%  . CARDIOVERSION    . DISTAL BICEPS TENDON REPAIR Right 2011  . INGUINAL HERNIA REPAIR Right 1992  . INGUINAL HERNIA REPAIR Left 2013  . INSERTION OF MESH N/A 05/14/2015   Procedure: INSERTION OF MESH;  Surgeon: Avel Peace, MD;  Location: [facility];  Service: General;  Laterality: N/A;  . SHOULDER ARTHROSCOPY W/ ROTATOR CUFF REPAIR Left 09/04/2012  . SHOULDER SURGERY     left  . TOTAL HIP ARTHROPLASTY Right 11/26/2014   Procedure: RIGHT TOTAL HIP ARTHROPLASTY ANTERIOR APPROACH;  Surgeon: Ollen Gross, MD;  [facility];  Service: Orthopedics;  Laterality: Right;  . TRANSTHORACIC ECHOCARDIOGRAM  03/05/2010   EF 60-65%  . UMBILICAL HERNIA REPAIR N/A 05/14/2015   Procedure: HERNIA REPAIR UMBILICAL ADULT WITH MESH ;  Surgeon: Avel Peace, MD;  [facility];  Service: General;  Laterality: N/A;  . WISDOM TOOTH EXTRACTION     lower 2    MEDICATIONS: No current facility-administered medications for this encounter.   Marland Kitchen acetaminophen (TYLENOL) 500 MG tablet  . ALPRAZolam (XANAX) 0.25 MG tablet  . amLODipine (NORVASC) 5 MG tablet  . ezetimibe (ZETIA) 10 MG tablet  . fluocinonide (LIDEX) 0.05 % external solution  . metoprolol succinate (TOPROL-XL) 25 MG 24 hr tablet  . omeprazole (PRILOSEC) 20 MG capsule  . propafenone (RYTHMOL SR) 325 MG 12 hr capsule  . REPATHA SURECLICK 140 MG/ML SOAJ  . SYNTHROID 150 MCG tablet  . tamsulosin (FLOMAX) 0.4 MG CAPS capsule  . XARELTO 20 MG TABS tablet  . lisinopril (ZESTRIL) 20 MG tablet  . omega-3 acid ethyl esters (LOVAZA) 1 g capsule    Janey Genta [facility] Pre-Surgical Testing [phone] 01/29/19  12:16 PM

## 2019-01-30 ENCOUNTER — Observation Stay (HOSPITAL_COMMUNITY): Payer: BC Managed Care – PPO

## 2019-01-30 ENCOUNTER — Inpatient Hospital Stay (HOSPITAL_COMMUNITY): Payer: BC Managed Care – PPO

## 2019-01-30 ENCOUNTER — Telehealth (HOSPITAL_COMMUNITY): Payer: Self-pay | Admitting: *Deleted

## 2019-01-30 ENCOUNTER — Encounter (HOSPITAL_COMMUNITY)
Admission: RE | Disposition: A | Discharge status: Home / Self Care | Payer: Self-pay | Source: Home / Self Care | Attending: Orthopedic Surgery

## 2019-01-30 ENCOUNTER — Inpatient Hospital Stay (HOSPITAL_COMMUNITY): Payer: BC Managed Care – PPO | Admitting: Physician Assistant

## 2019-01-30 ENCOUNTER — Inpatient Hospital Stay (HOSPITAL_COMMUNITY)
Admission: RE | Admit: 2019-01-30 | Discharge: 2019-01-31 | DRG: 470 | Disposition: A | Discharge status: Home / Self Care | Payer: BC Managed Care – PPO | Attending: Orthopedic Surgery | Admitting: Orthopedic Surgery

## 2019-01-30 ENCOUNTER — Encounter (HOSPITAL_COMMUNITY): Payer: Self-pay | Admitting: Orthopedic Surgery

## 2019-01-30 DIAGNOSIS — N183 Chronic kidney disease, stage 3 unspecified: Secondary | ICD-10-CM | POA: Diagnosis present

## 2019-01-30 DIAGNOSIS — Z419 Encounter for procedure for purposes other than remedying health state, unspecified: Secondary | ICD-10-CM

## 2019-01-30 DIAGNOSIS — M25752 Osteophyte, left hip: Secondary | ICD-10-CM | POA: Diagnosis not present

## 2019-01-30 DIAGNOSIS — E669 Obesity, unspecified: Secondary | ICD-10-CM | POA: Diagnosis not present

## 2019-01-30 DIAGNOSIS — Z79890 Hormone replacement therapy (postmenopausal): Secondary | ICD-10-CM

## 2019-01-30 DIAGNOSIS — Z86010 Personal history of colonic polyps: Secondary | ICD-10-CM | POA: Diagnosis not present

## 2019-01-30 DIAGNOSIS — I129 Hypertensive chronic kidney disease with stage 1 through stage 4 chronic kidney disease, or unspecified chronic kidney disease: Secondary | ICD-10-CM | POA: Diagnosis not present

## 2019-01-30 DIAGNOSIS — E785 Hyperlipidemia, unspecified: Secondary | ICD-10-CM | POA: Diagnosis not present

## 2019-01-30 DIAGNOSIS — Z96641 Presence of right artificial hip joint: Secondary | ICD-10-CM | POA: Diagnosis present

## 2019-01-30 DIAGNOSIS — I480 Paroxysmal atrial fibrillation: Secondary | ICD-10-CM | POA: Diagnosis present

## 2019-01-30 DIAGNOSIS — Z9181 History of falling: Secondary | ICD-10-CM | POA: Diagnosis not present

## 2019-01-30 DIAGNOSIS — K219 Gastro-esophageal reflux disease without esophagitis: Secondary | ICD-10-CM | POA: Diagnosis not present

## 2019-01-30 DIAGNOSIS — Z8619 Personal history of other infectious and parasitic diseases: Secondary | ICD-10-CM

## 2019-01-30 DIAGNOSIS — G4733 Obstructive sleep apnea (adult) (pediatric): Secondary | ICD-10-CM | POA: Diagnosis not present

## 2019-01-30 DIAGNOSIS — M1612 Unilateral primary osteoarthritis, left hip: Principal | ICD-10-CM | POA: Diagnosis present

## 2019-01-30 DIAGNOSIS — M169 Osteoarthritis of hip, unspecified: Secondary | ICD-10-CM | POA: Diagnosis present

## 2019-01-30 DIAGNOSIS — Z96649 Presence of unspecified artificial hip joint: Secondary | ICD-10-CM

## 2019-01-30 DIAGNOSIS — D6862 Lupus anticoagulant syndrome: Secondary | ICD-10-CM | POA: Diagnosis not present

## 2019-01-30 DIAGNOSIS — Z86711 Personal history of pulmonary embolism: Secondary | ICD-10-CM

## 2019-01-30 DIAGNOSIS — Z8349 Family history of other endocrine, nutritional and metabolic diseases: Secondary | ICD-10-CM | POA: Diagnosis not present

## 2019-01-30 DIAGNOSIS — Z803 Family history of malignant neoplasm of breast: Secondary | ICD-10-CM | POA: Diagnosis not present

## 2019-01-30 DIAGNOSIS — Z8249 Family history of ischemic heart disease and other diseases of the circulatory system: Secondary | ICD-10-CM

## 2019-01-30 DIAGNOSIS — Z8042 Family history of malignant neoplasm of prostate: Secondary | ICD-10-CM | POA: Diagnosis not present

## 2019-01-30 DIAGNOSIS — Z96642 Presence of left artificial hip joint: Secondary | ICD-10-CM | POA: Diagnosis not present

## 2019-01-30 DIAGNOSIS — Z6832 Body mass index (BMI) 32.0-32.9, adult: Secondary | ICD-10-CM | POA: Diagnosis not present

## 2019-01-30 DIAGNOSIS — E039 Hypothyroidism, unspecified: Secondary | ICD-10-CM | POA: Diagnosis not present

## 2019-01-30 DIAGNOSIS — Z471 Aftercare following joint replacement surgery: Secondary | ICD-10-CM | POA: Diagnosis not present

## 2019-01-30 DIAGNOSIS — Z79899 Other long term (current) drug therapy: Secondary | ICD-10-CM

## 2019-01-30 DIAGNOSIS — Z20822 Contact with and (suspected) exposure to covid-19: Secondary | ICD-10-CM | POA: Diagnosis not present

## 2019-01-30 DIAGNOSIS — Z86718 Personal history of other venous thrombosis and embolism: Secondary | ICD-10-CM

## 2019-01-30 DIAGNOSIS — Z7901 Long term (current) use of anticoagulants: Secondary | ICD-10-CM | POA: Diagnosis not present

## 2019-01-30 HISTORY — PX: TOTAL HIP ARTHROPLASTY: SHX124

## 2019-01-30 LAB — TYPE AND SCREEN
ABO/RH(D): O POS
Antibody Screen: NEGATIVE

## 2019-01-30 SURGERY — ARTHROPLASTY, HIP, TOTAL, ANTERIOR APPROACH
Anesthesia: General | Site: Hip | Laterality: Left

## 2019-01-30 MED ORDER — EZETIMIBE 10 MG PO TABS
10.0000 mg | ORAL_TABLET | Freq: Every day | ORAL | Status: DC
  Administered 2019-01-30: 10 mg via ORAL
  Filled 2019-01-30: qty 1

## 2019-01-30 MED ORDER — FENTANYL CITRATE (PF) 250 MCG/5ML IJ SOLN
INTRAMUSCULAR | Status: AC
  Filled 2019-01-30: qty 5

## 2019-01-30 MED ORDER — METHOCARBAMOL 500 MG IVPB - SIMPLE MED
500.0000 mg | Freq: Four times a day (QID) | INTRAVENOUS | Status: DC | PRN
  Administered 2019-01-30: 500 mg via INTRAVENOUS
  Filled 2019-01-30: qty 50

## 2019-01-30 MED ORDER — FENTANYL CITRATE (PF) 100 MCG/2ML IJ SOLN
INTRAMUSCULAR | Status: DC | PRN
  Administered 2019-01-30: 12.5 ug via INTRAVENOUS
  Administered 2019-01-30 (×2): 100 ug via INTRAVENOUS
  Administered 2019-01-30: 50 ug via INTRAVENOUS

## 2019-01-30 MED ORDER — PROPAFENONE HCL ER 325 MG PO CP12
325.0000 mg | ORAL_CAPSULE | Freq: Two times a day (BID) | ORAL | Status: DC
  Administered 2019-01-30 – 2019-01-31 (×2): 325 mg via ORAL
  Filled 2019-01-30 (×2): qty 1

## 2019-01-30 MED ORDER — FENTANYL CITRATE (PF) 100 MCG/2ML IJ SOLN
INTRAMUSCULAR | Status: AC
  Filled 2019-01-30: qty 2

## 2019-01-30 MED ORDER — ONDANSETRON HCL 4 MG/2ML IJ SOLN
INTRAMUSCULAR | Status: AC
  Filled 2019-01-30: qty 2

## 2019-01-30 MED ORDER — EPHEDRINE SULFATE-NACL 50-0.9 MG/10ML-% IV SOSY
PREFILLED_SYRINGE | INTRAVENOUS | Status: DC | PRN
  Administered 2019-01-30 (×4): 5 mg via INTRAVENOUS

## 2019-01-30 MED ORDER — TRAMADOL HCL 50 MG PO TABS
50.0000 mg | ORAL_TABLET | Freq: Four times a day (QID) | ORAL | Status: DC | PRN
  Administered 2019-01-31: 100 mg via ORAL
  Filled 2019-01-30: qty 2

## 2019-01-30 MED ORDER — ONDANSETRON HCL 4 MG PO TABS: 4.0000 mg | ORAL_TABLET | Freq: Four times a day (QID) | ORAL | Status: DC | PRN

## 2019-01-30 MED ORDER — DEXAMETHASONE SODIUM PHOSPHATE 10 MG/ML IJ SOLN
8.0000 mg | Freq: Once | INTRAMUSCULAR | Status: AC
  Administered 2019-01-30: 8 mg via INTRAVENOUS

## 2019-01-30 MED ORDER — PHENOL 1.4 % MT LIQD
1.0000 | OROMUCOSAL | Status: DC | PRN
  Filled 2019-01-30: qty 177

## 2019-01-30 MED ORDER — SODIUM CHLORIDE 0.9 % IV SOLN: INTRAVENOUS | Status: DC

## 2019-01-30 MED ORDER — PROPOFOL 10 MG/ML IV BOLUS
INTRAVENOUS | Status: AC
  Filled 2019-01-30: qty 20

## 2019-01-30 MED ORDER — MAGNESIUM CITRATE PO SOLN: 1.0000 | Freq: Once | ORAL | Status: DC | PRN

## 2019-01-30 MED ORDER — METOCLOPRAMIDE HCL 5 MG/ML IJ SOLN: 5.0000 mg | Freq: Three times a day (TID) | INTRAMUSCULAR | Status: DC | PRN

## 2019-01-30 MED ORDER — BUPIVACAINE HCL 0.25 % IJ SOLN
INTRAMUSCULAR | Status: AC
  Filled 2019-01-30: qty 1

## 2019-01-30 MED ORDER — CEFAZOLIN SODIUM-DEXTROSE 2-4 GM/100ML-% IV SOLN
2.0000 g | INTRAVENOUS | Status: AC
  Administered 2019-01-30: 2 g via INTRAVENOUS
  Filled 2019-01-30: qty 100

## 2019-01-30 MED ORDER — METHOCARBAMOL 500 MG IVPB - SIMPLE MED
INTRAVENOUS | Status: AC
  Filled 2019-01-30: qty 50

## 2019-01-30 MED ORDER — HYDROCODONE-ACETAMINOPHEN 7.5-325 MG PO TABS
1.0000 | ORAL_TABLET | ORAL | Status: DC | PRN
  Filled 2019-01-30: qty 2

## 2019-01-30 MED ORDER — CEFAZOLIN SODIUM-DEXTROSE 2-4 GM/100ML-% IV SOLN
2.0000 g | Freq: Four times a day (QID) | INTRAVENOUS | Status: AC
  Administered 2019-01-30 – 2019-01-31 (×2): 2 g via INTRAVENOUS
  Filled 2019-01-30 (×2): qty 100

## 2019-01-30 MED ORDER — ALPRAZOLAM 0.25 MG PO TABS
0.2500 mg | ORAL_TABLET | Freq: Two times a day (BID) | ORAL | Status: DC
  Administered 2019-01-30 – 2019-01-31 (×2): 0.25 mg via ORAL
  Filled 2019-01-30 (×2): qty 1

## 2019-01-30 MED ORDER — ONDANSETRON HCL 4 MG/2ML IJ SOLN
INTRAMUSCULAR | Status: DC | PRN
  Administered 2019-01-30: 4 mg via INTRAVENOUS

## 2019-01-30 MED ORDER — BUPIVACAINE HCL 0.25 % IJ SOLN
INTRAMUSCULAR | Status: DC | PRN
  Administered 2019-01-30: 40 mL

## 2019-01-30 MED ORDER — MORPHINE SULFATE (PF) 2 MG/ML IV SOLN
0.5000 mg | INTRAVENOUS | Status: DC | PRN
  Administered 2019-01-30: 1 mg via INTRAVENOUS
  Filled 2019-01-30: qty 1

## 2019-01-30 MED ORDER — TRANEXAMIC ACID 1000 MG/10ML IV SOLN
2000.0000 mg | Freq: Once | INTRAVENOUS | Status: DC
  Filled 2019-01-30: qty 20

## 2019-01-30 MED ORDER — ROCURONIUM BROMIDE 10 MG/ML (PF) SYRINGE
PREFILLED_SYRINGE | INTRAVENOUS | Status: AC
  Filled 2019-01-30: qty 10

## 2019-01-30 MED ORDER — ROCURONIUM BROMIDE 50 MG/5ML IV SOSY
PREFILLED_SYRINGE | INTRAVENOUS | Status: DC | PRN
  Administered 2019-01-30: 60 mg via INTRAVENOUS

## 2019-01-30 MED ORDER — AMLODIPINE BESYLATE 5 MG PO TABS
5.0000 mg | ORAL_TABLET | Freq: Every day | ORAL | Status: DC
  Administered 2019-01-31: 5 mg via ORAL
  Filled 2019-01-30: qty 1

## 2019-01-30 MED ORDER — SCOPOLAMINE 1 MG/3DAYS TD PT72
1.0000 | MEDICATED_PATCH | TRANSDERMAL | Status: DC
  Administered 2019-01-30: 1.5 mg via TRANSDERMAL
  Filled 2019-01-30: qty 1

## 2019-01-30 MED ORDER — MIDAZOLAM HCL 5 MG/5ML IJ SOLN
INTRAMUSCULAR | Status: DC | PRN
  Administered 2019-01-30: 2 mg via INTRAVENOUS

## 2019-01-30 MED ORDER — PANTOPRAZOLE SODIUM 40 MG PO TBEC
40.0000 mg | DELAYED_RELEASE_TABLET | Freq: Every day | ORAL | Status: DC
  Administered 2019-01-31: 40 mg via ORAL
  Filled 2019-01-30 (×2): qty 1

## 2019-01-30 MED ORDER — POLYETHYLENE GLYCOL 3350 17 G PO PACK: 17.0000 g | PACK | Freq: Every day | ORAL | Status: DC | PRN

## 2019-01-30 MED ORDER — CHLORHEXIDINE GLUCONATE 4 % EX LIQD: 60.0000 mL | Freq: Once | CUTANEOUS | Status: DC

## 2019-01-30 MED ORDER — LACTATED RINGERS IV SOLN: INTRAVENOUS | Status: DC

## 2019-01-30 MED ORDER — TAMSULOSIN HCL 0.4 MG PO CAPS
0.4000 mg | ORAL_CAPSULE | Freq: Every evening | ORAL | Status: DC
  Administered 2019-01-30: 0.4 mg via ORAL
  Filled 2019-01-30: qty 1

## 2019-01-30 MED ORDER — METHOCARBAMOL 500 MG PO TABS
500.0000 mg | ORAL_TABLET | Freq: Four times a day (QID) | ORAL | Status: DC | PRN
  Administered 2019-01-30 – 2019-01-31 (×2): 500 mg via ORAL
  Filled 2019-01-30 (×3): qty 1

## 2019-01-30 MED ORDER — PHENYLEPHRINE HCL-NACL 10-0.9 MG/250ML-% IV SOLN
INTRAVENOUS | Status: DC | PRN
  Administered 2019-01-30: 35 ug/min via INTRAVENOUS

## 2019-01-30 MED ORDER — 0.9 % SODIUM CHLORIDE (POUR BTL) OPTIME
TOPICAL | Status: DC | PRN
  Administered 2019-01-30: 14:00:00 1000 mL

## 2019-01-30 MED ORDER — ONDANSETRON HCL 4 MG/2ML IJ SOLN: 4.0000 mg | Freq: Once | INTRAMUSCULAR | Status: DC | PRN

## 2019-01-30 MED ORDER — BISACODYL 10 MG RE SUPP: 10.0000 mg | Freq: Every day | RECTAL | Status: DC | PRN

## 2019-01-30 MED ORDER — LEVOTHYROXINE SODIUM 100 MCG PO TABS
300.0000 ug | ORAL_TABLET | Freq: Every day | ORAL | Status: DC
  Administered 2019-01-31: 300 ug via ORAL
  Filled 2019-01-30: qty 3

## 2019-01-30 MED ORDER — POVIDONE-IODINE 10 % EX SWAB
2.0000 "application " | Freq: Once | CUTANEOUS | Status: AC
  Administered 2019-01-30: 2 via TOPICAL

## 2019-01-30 MED ORDER — MIDAZOLAM HCL 2 MG/2ML IJ SOLN
INTRAMUSCULAR | Status: AC
  Filled 2019-01-30: qty 2

## 2019-01-30 MED ORDER — FENTANYL CITRATE (PF) 100 MCG/2ML IJ SOLN
25.0000 ug | INTRAMUSCULAR | Status: DC | PRN
  Administered 2019-01-30 (×2): 50 ug via INTRAVENOUS

## 2019-01-30 MED ORDER — DOCUSATE SODIUM 100 MG PO CAPS
100.0000 mg | ORAL_CAPSULE | Freq: Two times a day (BID) | ORAL | Status: DC
  Administered 2019-01-30 – 2019-01-31 (×2): 100 mg via ORAL
  Filled 2019-01-30 (×2): qty 1

## 2019-01-30 MED ORDER — PROPOFOL 500 MG/50ML IV EMUL
INTRAVENOUS | Status: AC
  Filled 2019-01-30: qty 50

## 2019-01-30 MED ORDER — MENTHOL 3 MG MT LOZG: 1.0000 | LOZENGE | OROMUCOSAL | Status: DC | PRN

## 2019-01-30 MED ORDER — ONDANSETRON HCL 4 MG/2ML IJ SOLN: 4.0000 mg | Freq: Four times a day (QID) | INTRAMUSCULAR | Status: DC | PRN

## 2019-01-30 MED ORDER — ACETAMINOPHEN 325 MG PO TABS: 325.0000 mg | ORAL_TABLET | Freq: Four times a day (QID) | ORAL | Status: DC | PRN

## 2019-01-30 MED ORDER — ACETAMINOPHEN 10 MG/ML IV SOLN
1000.0000 mg | Freq: Four times a day (QID) | INTRAVENOUS | Status: DC
  Administered 2019-01-30: 1000 mg via INTRAVENOUS
  Filled 2019-01-30: qty 100

## 2019-01-30 MED ORDER — PHENYLEPHRINE HCL (PRESSORS) 10 MG/ML IV SOLN
INTRAVENOUS | Status: AC
  Filled 2019-01-30: qty 1

## 2019-01-30 MED ORDER — PROPOFOL 10 MG/ML IV BOLUS
INTRAVENOUS | Status: DC | PRN
  Administered 2019-01-30: 200 mg via INTRAVENOUS

## 2019-01-30 MED ORDER — METOPROLOL SUCCINATE ER 25 MG PO TB24
25.0000 mg | ORAL_TABLET | Freq: Every day | ORAL | Status: DC
  Administered 2019-01-31: 25 mg via ORAL
  Filled 2019-01-30: qty 1

## 2019-01-30 MED ORDER — SUGAMMADEX SODIUM 200 MG/2ML IV SOLN
INTRAVENOUS | Status: DC | PRN
  Administered 2019-01-30: 225 mg via INTRAVENOUS

## 2019-01-30 MED ORDER — HYDROCODONE-ACETAMINOPHEN 5-325 MG PO TABS
1.0000 | ORAL_TABLET | ORAL | Status: DC | PRN
  Administered 2019-01-30 – 2019-01-31 (×3): 2 via ORAL
  Administered 2019-01-31: 1 via ORAL
  Filled 2019-01-30 (×4): qty 2

## 2019-01-30 MED ORDER — FENTANYL CITRATE (PF) 100 MCG/2ML IJ SOLN
INTRAMUSCULAR | Status: AC
  Filled 2019-01-30: qty 4

## 2019-01-30 MED ORDER — LIDOCAINE 2% (20 MG/ML) 5 ML SYRINGE
INTRAMUSCULAR | Status: AC
  Filled 2019-01-30: qty 5

## 2019-01-30 MED ORDER — RIVAROXABAN 10 MG PO TABS
10.0000 mg | ORAL_TABLET | Freq: Every day | ORAL | Status: DC
  Administered 2019-01-31: 10 mg via ORAL
  Filled 2019-01-30: qty 1

## 2019-01-30 MED ORDER — LIDOCAINE 2% (20 MG/ML) 5 ML SYRINGE
INTRAMUSCULAR | Status: DC | PRN
  Administered 2019-01-30: 60 mg via INTRAVENOUS

## 2019-01-30 MED ORDER — METOCLOPRAMIDE HCL 5 MG PO TABS: 5.0000 mg | ORAL_TABLET | Freq: Three times a day (TID) | ORAL | Status: DC | PRN

## 2019-01-30 MED ORDER — DEXAMETHASONE SODIUM PHOSPHATE 10 MG/ML IJ SOLN
INTRAMUSCULAR | Status: AC
  Filled 2019-01-30: qty 1

## 2019-01-30 MED ORDER — DEXAMETHASONE SODIUM PHOSPHATE 10 MG/ML IJ SOLN
10.0000 mg | Freq: Once | INTRAMUSCULAR | Status: AC
  Administered 2019-01-31: 10 mg via INTRAVENOUS
  Filled 2019-01-30: qty 1

## 2019-01-30 SURGICAL SUPPLY — 46 items
BAG DECANTER FOR FLEXI CONT (MISCELLANEOUS) IMPLANT
BAG SPEC THK2 15X12 ZIP CLS (MISCELLANEOUS)
BAG ZIPLOCK 12X15 (MISCELLANEOUS) IMPLANT
BLADE SAG 18X100X1.27 (BLADE) ×2 IMPLANT
COVER PERINEAL POST (MISCELLANEOUS) ×2 IMPLANT
COVER SURGICAL LIGHT HANDLE (MISCELLANEOUS) ×2 IMPLANT
COVER WAND RF STERILE (DRAPES) IMPLANT
CUP ACETBLR 54 OD PINNACLE (Hips) ×2 IMPLANT
DECANTER SPIKE VIAL GLASS SM (MISCELLANEOUS) ×2 IMPLANT
DRAPE STERI IOBAN 125X83 (DRAPES) ×2 IMPLANT
DRAPE U-SHAPE 47X51 STRL (DRAPES) ×4 IMPLANT
DRSG ADAPTIC 3X8 NADH LF (GAUZE/BANDAGES/DRESSINGS) ×2 IMPLANT
DRSG MEPILEX BORDER 4X4 (GAUZE/BANDAGES/DRESSINGS) ×2 IMPLANT
DRSG MEPILEX BORDER 4X8 (GAUZE/BANDAGES/DRESSINGS) ×2 IMPLANT
DURAPREP 26ML APPLICATOR (WOUND CARE) ×2 IMPLANT
ELECT REM PT RETURN 15FT ADLT (MISCELLANEOUS) ×2 IMPLANT
EVACUATOR 1/8 PVC DRAIN (DRAIN) ×2 IMPLANT
GLOVE BIO SURGEON STRL SZ 6 (GLOVE) IMPLANT
GLOVE BIO SURGEON STRL SZ7 (GLOVE) IMPLANT
GLOVE BIO SURGEON STRL SZ8 (GLOVE) ×2 IMPLANT
GLOVE BIOGEL PI IND STRL 6.5 (GLOVE) IMPLANT
GLOVE BIOGEL PI IND STRL 7.0 (GLOVE) IMPLANT
GLOVE BIOGEL PI IND STRL 8 (GLOVE) ×1 IMPLANT
GLOVE BIOGEL PI INDICATOR 6.5 (GLOVE)
GLOVE BIOGEL PI INDICATOR 7.0 (GLOVE)
GLOVE BIOGEL PI INDICATOR 8 (GLOVE) ×1
GOWN STRL REUS W/TWL LRG LVL3 (GOWN DISPOSABLE) ×2 IMPLANT
GOWN STRL REUS W/TWL XL LVL3 (GOWN DISPOSABLE) IMPLANT
HEAD CERAMIC DELTA 36 PLUS 1.5 (Hips) ×2 IMPLANT
HOLDER FOLEY CATH W/STRAP (MISCELLANEOUS) IMPLANT
KIT TURNOVER KIT A (KITS) IMPLANT
LINER MARATHON NEUT +4X54X36 (Hips) ×2 IMPLANT
MANIFOLD NEPTUNE II (INSTRUMENTS) ×2 IMPLANT
PACK ANTERIOR HIP CUSTOM (KITS) ×2 IMPLANT
PENCIL SMOKE EVACUATOR COATED (MISCELLANEOUS) ×2 IMPLANT
STEM FEMORAL SZ5 HIGH ACTIS (Stem) ×1 IMPLANT
STRIP CLOSURE SKIN 1/2X4 (GAUZE/BANDAGES/DRESSINGS) ×2 IMPLANT
SUT ETHIBOND NAB CT1 #1 30IN (SUTURE) ×2 IMPLANT
SUT MNCRL AB 4-0 PS2 18 (SUTURE) ×2 IMPLANT
SUT STRATAFIX 0 PDS 27 VIOLET (SUTURE) ×2
SUT VIC AB 2-0 CT1 27 (SUTURE) ×4
SUT VIC AB 2-0 CT1 TAPERPNT 27 (SUTURE) ×2 IMPLANT
SUTURE STRATFX 0 PDS 27 VIOLET (SUTURE) ×1 IMPLANT
SYR 50ML LL SCALE MARK (SYRINGE) IMPLANT
TRAY FOLEY MTR SLVR 16FR STAT (SET/KITS/TRAYS/PACK) IMPLANT
YANKAUER SUCT BULB TIP 10FT TU (MISCELLANEOUS) ×2 IMPLANT

## 2019-01-30 NOTE — Anesthesia Postprocedure Evaluation (Signed)
Anesthesia Post Note  Patient: Isaiah [name], Isaiah [name]  Procedure(s) Performed: TOTAL HIP ARTHROPLASTY ANTERIOR APPROACH (Left Hip)     Patient location during evaluation: PACU Anesthesia Type: General Level of consciousness: awake and alert Pain management: pain level controlled Vital Signs Assessment: post-procedure vital signs reviewed and stable Respiratory status: spontaneous breathing, nonlabored ventilation, respiratory function stable and patient connected to nasal cannula oxygen Cardiovascular status: blood pressure returned to baseline and stable Postop Assessment: no apparent nausea or vomiting Anesthetic complications: no    Last Vitals:  Vitals:   01/30/19 1734 01/30/19 1844  BP: (!) 159/92 126/89  Pulse: (!) 59 95  Resp: 13   Temp: 36.7 C   SpO2: 100% 94%    Last Pain:  Vitals:   01/30/19 1812  TempSrc:   PainSc: 6                  Kennieth Rad

## 2019-01-30 NOTE — Evaluation (Signed)
Physical Therapy Evaluation Patient Details Name: Isaiah [name], Isaiah [name] MRN: [id number] DOB: 01/06/1961 Today's Date: 01/30/2019   History of Present Illness  Patient is 59 y.o. male s/p Lt THA anterior approach on 01/30/19 with PMH significant for HTN, HLD, DVT, OA, GERD, and Rt THA.    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 Patient reports independence with mobility at baseline. Patient is now limited by functional impairments (see PT problem list below) and requires min assist for transfers and gait with RW. Patient was able to ambulate ~60 feet with RW and min assist with cues for safe management. Patient instructed in exercise to facilitate circulation. Patient will benefit from continued skilled PT interventions to address impairments and progress towards PLOF. Acute PT will follow to progress mobility and stair training in preparation for safe discharge home.    Follow Up Recommendations Follow surgeon's recommendation for DC plan and follow-up therapies    Equipment Recommendations  None recommended by PT    Recommendations for Other Services       Precautions / Restrictions Precautions Precautions: Fall Restrictions Weight Bearing Restrictions: No      Mobility  Bed Mobility Overal bed mobility: Needs Assistance Bed Mobility: Supine to Sit     Supine to sit: HOB elevated;Min assist     General bed mobility comments: cues for LE mobility and use of bed rail to pivot/roll to EOB, assistance required to raise trunk upright.  Transfers Overall transfer level: Needs assistance Equipment used: Rolling walker (2 wheeled) Transfers: Sit to/from Stand Sit to Stand: Min assist;From elevated surface         General transfer comment: repeated verbal cues for safe hand placement and technique with RW required, assist required to complete power up and rise, pt guarding against WB on Lt LE upon  standing  Ambulation/Gait Ambulation/Gait assistance: Min assist Gait Distance (Feet): 60 Feet Assistive device: Rolling walker (2 wheeled) Gait Pattern/deviations: Step-to pattern;Decreased stride length;Decreased stance time - left;Decreased step length - right Gait velocity: decreased   General Gait Details: cues for safe hand placement on RW and safe step pattern, no overt LOB, pt reliant on UE support on RW to reduce WB on Lt LE. pt requried assist for safe RW positioning.  Stairs            Wheelchair Mobility    Modified Rankin (Stroke Patients Only)       Balance Overall balance assessment: Needs assistance Sitting-balance support: Feet supported Sitting balance-Leahy Scale: Good     Standing balance support: During functional activity;Bilateral upper extremity supported Standing balance-Leahy Scale: Poor          Pertinent Vitals/Pain Pain Assessment: 0-10 Pain Score: 8  Pain Location: Lt hip Pain Descriptors / Indicators: Sore;Aching Pain Intervention(s): Limited activity within patient's tolerance;Monitored during session;Repositioned    Home Living Family/patient expects to be discharged to:: Private residence Living Arrangements: Non-relatives/Friends Available Help at Discharge: Available 24 hours/day Type of Home: House Home Access: Stairs to enter Entrance Stairs-Rails: Doctor, general practice of Steps: 3 Home Layout: Multi-level;Able to live on main level with bedroom/bathroom Home Equipment: Dan Humphreys - 2 wheels;Cane - single point      Prior Function Level of Independence: Independent         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   Dominant Hand: Left    Extremity/Trunk Assessment   Upper Extremity Assessment Upper Extremity Assessment:  Overall WFL for tasks assessed    Lower Extremity Assessment Lower Extremity Assessment: Overall WFL for tasks assessed;LLE deficits/detail LLE: Unable to fully assess due to  pain LLE Sensation: WNL LLE Coordination: WNL    Cervical / Trunk Assessment Cervical / Trunk Assessment: Normal  Communication   Communication: No difficulties  Cognition Arousal/Alertness: Awake/alert Behavior During Therapy: WFL for tasks assessed/performed Overall Cognitive Status: Within Functional Limits for tasks assessed         General Comments      Exercises Total Joint Exercises Ankle Circles/Pumps: AROM;Both;Seated;10 reps   Assessment/Plan    PT Assessment Patient needs continued PT services  PT Problem List Decreased strength;Decreased balance;Decreased mobility;Decreased range of motion;Decreased activity tolerance;Decreased knowledge of use of DME       PT Treatment Interventions DME instruction;Functional mobility training;Balance training;Patient/family education;Gait training;Therapeutic activities;Therapeutic exercise;Stair training    PT Goals (Current goals can be found in the Care Plan section)  Acute Rehab PT Goals Patient Stated Goal: to get home tomorrow PT Goal Formulation: With patient Time For Goal Achievement: 02/06/19 Potential to Achieve Goals: Good    Frequency 7X/week    AM-PAC PT "6 Clicks" Mobility  Outcome Measure Help needed turning from your back to your side while in a flat bed without using bedrails?: A Little Help needed moving from lying on your back to sitting on the side of a flat bed without using bedrails?: A Little Help needed moving to and from a bed to a chair (including a wheelchair)?: A Little Help needed standing up from a chair using your arms (e.g., wheelchair or bedside chair)?: A Little Help needed to walk in hospital room?: A Little Help needed climbing 3-5 steps with a railing? : A Lot 6 Click Score: 17    End of Session Equipment Utilized During Treatment: Gait belt Activity Tolerance: Patient tolerated treatment well Patient left: in chair;with call bell/phone within reach;with chair alarm set;with  family/visitor present Nurse Communication: Mobility status PT Visit Diagnosis: Muscle weakness (generalized) (M62.81);Difficulty in walking, not elsewhere classified (R26.2)    Time: 6270-3500 PT Time Calculation (min) (ACUTE ONLY): 36 min   Charges:   PT Evaluation $PT Eval Low Complexity: 1 Low PT Treatments $Gait Training: 8-22 mins       Verner Mould, DPT Physical Therapist with [facility] [phone]  01/30/2019 7:05 PM

## 2019-01-30 NOTE — Progress Notes (Signed)
Paged for CPAP order.

## 2019-01-30 NOTE — Anesthesia Procedure Notes (Signed)
Procedure Name: Intubation Date/Time: 01/30/2019 1:43 PM Performed by: Maxwell Caul, CRNA Pre-anesthesia Checklist: Patient identified, Emergency Drugs available, Suction available and Patient being monitored Patient Re-evaluated:Patient Re-evaluated prior to induction Oxygen Delivery Method: Circle system utilized Preoxygenation: Pre-oxygenation with 100% oxygen Induction Type: IV induction Ventilation: Mask ventilation without difficulty Laryngoscope Size: Mac and 4 Grade View: Grade I Tube type: Oral Tube size: 7.5 mm Number of attempts: 1 Airway Equipment and Method: Stylet and Oral airway Placement Confirmation: ETT inserted through vocal cords under direct vision,  positive ETCO2 and breath sounds checked- equal and bilateral Secured at: 21 cm Tube secured with: Tape Dental Injury: Teeth and Oropharynx as per pre-operative assessment

## 2019-01-30 NOTE — Anesthesia Preprocedure Evaluation (Signed)
Anesthesia Evaluation  Patient identified by MRN, date of birth, ID band Patient awake    Reviewed: Allergy & Precautions, NPO status , Patient's Chart, lab work & pertinent test results, reviewed documented beta blocker date and time   History of Anesthesia Complications (+) PONV  Airway Mallampati: II  TM Distance: >3 FB     Dental  (+) Dental Advisory Given   Pulmonary sleep apnea ,    breath sounds clear to auscultation       Cardiovascular hypertension, Pt. on medications and Pt. on home beta blockers + DVT  + dysrhythmias  Rhythm:Regular Rate:Normal     Neuro/Psych negative neurological ROS     GI/Hepatic Neg liver ROS, GERD  ,  Endo/Other  Hypothyroidism   Renal/GU Renal disease     Musculoskeletal  (+) Arthritis ,   Abdominal   Peds  Hematology  (+) Blood dyscrasia, ,   Anesthesia Other Findings   Reproductive/Obstetrics                             Lab Results  Component Value Date   WBC 4.3 01/25/2019   HGB 15.6 01/25/2019   HCT 46.1 01/25/2019   MCV 88 01/25/2019   PLT 206 01/25/2019   Lab Results  Component Value Date   CREATININE 1.41 (H) 01/25/2019   BUN 29 (H) 01/25/2019   NA 140 01/25/2019   K 4.5 01/25/2019   CL 103 01/25/2019   CO2 22 01/25/2019    Anesthesia Physical Anesthesia Plan  ASA: III  Anesthesia Plan: General   Post-op Pain Management:    Induction: Intravenous  PONV Risk Score and Plan: 3 and Dexamethasone, Ondansetron, Treatment may vary due to age or medical condition and Midazolam  Airway Management Planned: Oral ETT  Additional Equipment:   Intra-op Plan:   Post-operative Plan: Extubation in OR  Informed Consent: I have reviewed the patients History and Physical, chart, labs and discussed the procedure including the risks, benefits and alternatives for the proposed anesthesia with the patient or authorized representative who  has indicated his/her understanding and acceptance.     Dental advisory given  Plan Discussed with: CRNA  Anesthesia Plan Comments:         Anesthesia Quick Evaluation

## 2019-01-30 NOTE — Interval H&P Note (Signed)
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  The various methods of treatment have been discussed with the patient and family. After consideration of risks, benefits and other options for treatment, the patient has consented to  Procedure(s) with comments: TOTAL HIP ARTHROPLASTY ANTERIOR APPROACH (Left) - as a surgical intervention.  The patient's history has been reviewed, patient examined, no change in status, stable for surgery.  I have reviewed the patient's chart and labs.  Questions were answered to the patient's satisfaction.     [name]

## 2019-01-30 NOTE — Transfer of Care (Signed)
Immediate Anesthesia Transfer of Care Note  Patient: [name], [name]  Procedure(s) Performed: TOTAL HIP ARTHROPLASTY ANTERIOR APPROACH (Left Hip)  Patient Location: PACU  Anesthesia Type:General  Level of Consciousness: awake, alert  and oriented  Airway & Oxygen Therapy: Patient Spontanous Breathing and Patient connected to face mask oxygen  Post-op Assessment: Report given to RN, Post -op Vital signs reviewed and stable and Patient moving all extremities X 4  Post vital signs: Reviewed and stable  Last Vitals:  Vitals Value Taken Time  BP 141/80 01/30/19 1524  Temp    Pulse 85 01/30/19 1526  Resp 15 01/30/19 1526  SpO2 96 % 01/30/19 1526  Vitals shown include unvalidated device data.  Last Pain:  Vitals:   01/30/19 1146  TempSrc:   PainSc: 1          Complications: No apparent anesthesia complications

## 2019-01-30 NOTE — Op Note (Signed)
OPERATIVE REPORT- TOTAL HIP ARTHROPLASTY   PREOPERATIVE DIAGNOSIS: Osteoarthritis of the Left hip.   POSTOPERATIVE DIAGNOSIS: Osteoarthritis of the Left  hip.   PROCEDURE: Left total hip arthroplasty, anterior approach.   SURGEON: Gaynelle Arabian, MD   ASSISTANT: Griffith Citron, PA-C  ANESTHESIA:  General  ESTIMATED BLOOD LOSS:-250 mL    DRAINS: Hemovac x1.   COMPLICATIONS: None   CONDITION: PACU - hemodynamically stable.   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   PROCEDURE IN DETAIL: After successful administration of spinal  anesthetic, the traction boots for the [facility] bed were placed on both  feet and the patient was placed onto the [facility] bed, boots placed into the leg  holders. The Left hip was then isolated from the perineum with plastic  drapes and prepped and draped in the usual sterile fashion. ASIS and  greater trochanter were marked and a oblique incision was made, starting  at about 1 cm lateral and 2 cm distal to the ASIS and coursing towards  the anterior cortex of the femur. The skin was cut with a 10 blade  through subcutaneous tissue to the level of the fascia overlying the  tensor fascia lata muscle. The fascia was then incised in line with the  incision at the junction of the anterior third and posterior 2/3rd. The  muscle was teased off the fascia and then the interval between the TFL  and the rectus was developed. The Hohmann retractor was then placed at  the top of the femoral neck over the capsule. The vessels overlying the  capsule were cauterized and the fat on top of the capsule was removed.  A Hohmann retractor was then placed anterior underneath the rectus  femoris to give exposure to the entire anterior capsule. A T-shaped   capsulotomy was performed. The edges were tagged and the femoral head  was identified.       Osteophytes are removed off the superior acetabulum.  The femoral neck was then cut in situ with an oscillating saw. Traction  was then applied to the left lower extremity utilizing the [facility]  traction. The femoral head was then removed. Retractors were placed  around the acetabulum and then circumferential removal of the labrum was  performed. Osteophytes were also removed. Reaming starts at 49 mm to  medialize and  Increased in 2 mm increments to 53 mm. We reamed in  approximately 40 degrees of abduction, 20 degrees anteversion. A 54 mm  pinnacle acetabular shell was then impacted in anatomic position under  fluoroscopic guidance with excellent purchase. We did not need to place  any additional dome screws. A 36 mm neutral + 4 marathon liner was then  placed into the acetabular shell.       The femoral lift was then placed along the lateral aspect of the femur  just distal to the vastus ridge. The leg was  externally rotated and capsule  was stripped off the inferior aspect of the femoral neck down to the  level of the lesser trochanter, this was done with electrocautery. The femur was lifted after this was performed. The  leg was then placed in an extended and adducted position essentially delivering the femur. We also removed the capsule superiorly and the piriformis from the  piriformis fossa to gain excellent exposure of the  proximal femur. Rongeur was used to remove some cancellous bone to get  into the lateral portion of the proximal femur for placement of the  initial starter reamer. The starter broaches was placed  the starter broach  and was shown to go down the center of the canal. Broaching  with the Actis system was then performed starting at size 0  coursing  Up to size 5. A size 5 had excellent torsional and rotational  and axial stability. The trial high offset neck was then placed   with a 36 + 1.5 trial head. The hip was then reduced. We confirmed that  the stem was in the canal both on AP and lateral x-rays. It also has excellent sizing. The hip was reduced with outstanding stability through full extension and full external rotation.. AP pelvis was taken and the leg lengths were measured and found to be equal. Hip was then dislocated again and the femoral head and neck removed. The  femoral broach was removed. Size 5 Actis stem with a high offset  neck was then impacted into the femur following native anteversion. Has  excellent purchase in the canal. Excellent torsional and rotational and  axial stability. It is confirmed to be in the canal on AP and lateral  fluoroscopic views. The 36 + 1.5 ceramic head was placed and the hip  reduced with outstanding stability. Again AP pelvis was taken and it  confirmed that the leg lengths were equal. The wound was then copiously  irrigated with saline solution and the capsule reattached and repaired  with Ethibond suture. 30 ml of .25% Bupivicaine was  injected into the capsule and into the edge of the tensor fascia lata as well as subcutaneous tissue. The fascia overlying the tensor fascia lata was then closed with a running #1 V-Loc. Subcu was closed with interrupted 2-0 Vicryl and subcuticular running 4-0 Monocryl. Incision was cleaned  and dried. Steri-Strips and a bulky sterile dressing applied. Hemovac  drain was hooked to suction and then the patient was awakened and transported to  recovery in stable condition.        Please note that a surgical assistant was a medical necessity for this procedure to perform it in a safe and expeditious manner. Assistant was necessary to provide appropriate retraction of vital neurovascular structures and to prevent femoral fracture and allow for anatomic placement of the prosthesis.  Ollen Gross, M.D.

## 2019-01-30 NOTE — Discharge Instructions (Signed)
Dr. Frank Aluisio Total Joint Specialist [facility] [address] [place], [place] [address] [phone]  ANTERIOR APPROACH TOTAL HIP REPLACEMENT POSTOPERATIVE DIRECTIONS     Hip Rehabilitation, Guidelines Following Surgery  The results of a hip operation are greatly improved after range of motion and muscle strengthening exercises. Follow all safety measures which are given to protect your hip. If any of these exercises cause increased pain or swelling in your joint, decrease the amount until you are comfortable again. Then slowly increase the exercises. Call your caregiver if you have problems or questions.   HOME CARE INSTRUCTIONS  . Remove items at home which could result in a fall. This includes throw rugs or furniture in walking pathways.   ICE to the affected hip as frequently as 20-30 minutes an hour and then as needed for pain and swelling. Continue to use ice on the hip for pain and swelling from surgery. You may notice swelling that will progress down to the foot and ankle. This is normal after surgery. Elevate the leg when you are not up walking on it.    Continue to use the breathing machine which will help keep your temperature down.  It is common for your temperature to cycle up and down following surgery, especially at night when you are not up moving around and exerting yourself.  The breathing machine keeps your lungs expanded and your temperature down.  DIET You may resume your previous home diet once your are discharged from the hospital.  DRESSING / WOUND CARE / SHOWERING . You may remove the adhesive bandage 2 days following surgery. Leave the tape strips across the incision in place until your first follow-up appointment. Cover the incision with a 4x4 gauze and paper tape. Change the dressing daily with new gauze and paper tape for 7-10 days following surgery. . You may begin showering 3 days following surgery, but do not submerge the incision under  water. . Allow water and soap to run over the incision, pat dry, and apply a fresh gauze dressing.  ACTIVITY . For the first 3-5 days, it is important to rest and keep the operative leg elevated. You should, as a general rule, rest for 50 minutes and walk/stretch for 10 minutes per hour. After 5 days, you may slowly increase activity as tolerated.  . Perform the exercises you were provided twice a day for about 15-20 minutes each session. Begin these 2 days following surgery. . Walk with your walker as instructed. Use the walker until you are comfortable transitioning to a cane. Walk with the cane in the opposite hand of the operative leg. You may discontinue the cane once you are comfortable and walking steadily. . Avoid periods of inactivity such as sitting longer than an hour when not asleep. This helps prevent blood clots.  . Do not drive a car for 6 weeks or until released by your surgeon.  . Do not drive while taking narcotics.  TED HOSE STOCKINGS Wear the elastic stockings on both legs for three weeks following surgery during the day. You may remove them at night while sleeping.  WEIGHT BEARING Weight bearing as tolerated with assist device (walker, cane, etc) as directed, use it as long as suggested by your surgeon or therapist, typically at least 4-6 weeks.  POSTOPERATIVE CONSTIPATION PROTOCOL Constipation - defined medically as fewer than three stools per week and severe constipation as less than one stool per week.  One of the most common issues patients have   following surgery is constipation.  Even if you have a regular bowel pattern at home, your normal regimen is likely to be disrupted due to multiple reasons following surgery.  Combination of anesthesia, postoperative narcotics, change in appetite and fluid intake all can affect your bowels.  In order to avoid complications following surgery, here are some recommendations in order to help you during your recovery  period.  . Colace (docusate) - Pick up an over-the-counter form of Colace or another stool softener and take twice a day as long as you are requiring postoperative pain medications.  Take with a full glass of water daily.  If you experience loose stools or diarrhea, hold the colace until you stool forms back up.  If your symptoms do not get better within 1 week or if they get worse, check with your doctor. . Dulcolax (bisacodyl) - Pick up over-the-counter and take as directed by the product packaging as needed to assist with the movement of your bowels.  Take with a full glass of water.  Use this product as needed if not relieved by Colace only.  . MiraLax (polyethylene glycol) - Pick up over-the-counter to have on hand.  MiraLax is a solution that will increase the amount of water in your bowels to assist with bowel movements.  Take as directed and can mix with a glass of water, juice, soda, coffee, or tea.  Take if you go more than two days without a movement.Do not use MiraLax more than once per day. Call your doctor if you are still constipated or irregular after using this medication for 7 days in a row.  If you continue to have problems with postoperative constipation, please contact the office for further assistance and recommendations.  If you experience "the worst abdominal pain ever" or develop nausea or vomiting, please contact the office immediatly for further recommendations for treatment.  ITCHING  If you experience itching with your medications, try taking only a single pain pill, or even half a pain pill at a time.  You can also use Benadryl over the counter for itching or also to help with sleep.   MEDICATIONS See your medication summary on the "After Visit Summary" that the nursing staff will review with you prior to discharge.  You may have some home medications which will be placed on hold until you complete the course of blood thinner medication.  It is important for you to complete  the blood thinner medication as prescribed by your surgeon.  Continue your approved medications as instructed at time of discharge.  PRECAUTIONS If you experience chest pain or shortness of breath - call 911 immediately for transfer to the hospital emergency department.  If you develop a fever greater that 101 F, purulent drainage from wound, increased redness or drainage from wound, foul odor from the wound/dressing, or calf pain - CONTACT YOUR SURGEON.                                                   FOLLOW-UP APPOINTMENTS Make sure you keep all of your appointments after your operation with your surgeon and caregivers. You should call the office at the above phone number and make an appointment for approximately two weeks after the date of your surgery or on the date instructed by your surgeon outlined in the "After Visit Summary".    RANGE OF MOTION AND STRENGTHENING EXERCISES  These exercises are designed to help you keep full movement of your hip joint. Follow your caregiver's or physical therapist's instructions. Perform all exercises about fifteen times, three times per day or as directed. Exercise both hips, even if you have had only one joint replacement. These exercises can be done on a training (exercise) mat, on the floor, on a table or on a bed. Use whatever works the best and is most comfortable for you. Use music or television while you are exercising so that the exercises are a pleasant break in your day. This will make your life better with the exercises acting as a break in routine you can look forward to.  . Lying on your back, slowly slide your foot toward your buttocks, raising your knee up off the floor. Then slowly slide your foot back down until your leg is straight again.  . Lying on your back spread your legs as far apart as you can without causing discomfort.  . Lying on your side, raise your upper leg and foot straight up from the floor as far as is comfortable. Slowly lower  the leg and repeat.  . Lying on your back, tighten up the muscle in the front of your thigh (quadriceps muscles). You can do this by keeping your leg straight and trying to raise your heel off the floor. This helps strengthen the largest muscle supporting your knee.  . Lying on your back, tighten up the muscles of your buttocks both with the legs straight and with the knee bent at a comfortable angle while keeping your heel on the floor.   IF YOU ARE TRANSFERRED TO A SKILLED REHAB FACILITY If the patient is transferred to a skilled rehab facility following release from the hospital, a list of the current medications will be sent to the facility for the patient to continue.  When discharged from the skilled rehab facility, please have the facility set up the patient's Home Health Physical Therapy prior to being released. Also, the skilled facility will be responsible for providing the patient with their medications at time of release from the facility to include their pain medication, the muscle relaxants, and their blood thinner medication. If the patient is still at the rehab facility at time of the two week follow up appointment, the skilled rehab facility will also need to assist the patient in arranging follow up appointment in our office and any transportation needs.  MAKE SURE YOU:  . Understand these instructions.  . Get help right away if you are not doing well or get worse.    Pick up stool softner and laxative for home use following surgery while on pain medications. Do not submerge incision under water. Please use good hand washing techniques while changing dressing each day. May shower starting three days after surgery. Please use a clean towel to pat the incision dry following showers. Continue to use ice for pain and swelling after surgery. Do not use any lotions or creams on the incision until instructed by your surgeon.  

## 2019-01-31 ENCOUNTER — Encounter: Payer: Self-pay | Admitting: *Deleted

## 2019-01-31 DIAGNOSIS — G4733 Obstructive sleep apnea (adult) (pediatric): Secondary | ICD-10-CM | POA: Diagnosis present

## 2019-01-31 DIAGNOSIS — Z8042 Family history of malignant neoplasm of prostate: Secondary | ICD-10-CM | POA: Diagnosis not present

## 2019-01-31 DIAGNOSIS — Z8249 Family history of ischemic heart disease and other diseases of the circulatory system: Secondary | ICD-10-CM | POA: Diagnosis not present

## 2019-01-31 DIAGNOSIS — Z96641 Presence of right artificial hip joint: Secondary | ICD-10-CM | POA: Diagnosis present

## 2019-01-31 DIAGNOSIS — I480 Paroxysmal atrial fibrillation: Secondary | ICD-10-CM | POA: Diagnosis present

## 2019-01-31 DIAGNOSIS — M25752 Osteophyte, left hip: Secondary | ICD-10-CM | POA: Diagnosis present

## 2019-01-31 DIAGNOSIS — K219 Gastro-esophageal reflux disease without esophagitis: Secondary | ICD-10-CM | POA: Diagnosis present

## 2019-01-31 DIAGNOSIS — N183 Chronic kidney disease, stage 3 unspecified: Secondary | ICD-10-CM | POA: Diagnosis present

## 2019-01-31 DIAGNOSIS — Z9181 History of falling: Secondary | ICD-10-CM | POA: Diagnosis not present

## 2019-01-31 DIAGNOSIS — E669 Obesity, unspecified: Secondary | ICD-10-CM | POA: Diagnosis present

## 2019-01-31 DIAGNOSIS — Z86718 Personal history of other venous thrombosis and embolism: Secondary | ICD-10-CM | POA: Diagnosis not present

## 2019-01-31 DIAGNOSIS — Z86711 Personal history of pulmonary embolism: Secondary | ICD-10-CM | POA: Diagnosis not present

## 2019-01-31 DIAGNOSIS — D6862 Lupus anticoagulant syndrome: Secondary | ICD-10-CM | POA: Diagnosis present

## 2019-01-31 DIAGNOSIS — Z8619 Personal history of other infectious and parasitic diseases: Secondary | ICD-10-CM | POA: Diagnosis not present

## 2019-01-31 DIAGNOSIS — Z20822 Contact with and (suspected) exposure to covid-19: Secondary | ICD-10-CM | POA: Diagnosis present

## 2019-01-31 DIAGNOSIS — M1612 Unilateral primary osteoarthritis, left hip: Secondary | ICD-10-CM | POA: Diagnosis present

## 2019-01-31 DIAGNOSIS — Z803 Family history of malignant neoplasm of breast: Secondary | ICD-10-CM | POA: Diagnosis not present

## 2019-01-31 DIAGNOSIS — E785 Hyperlipidemia, unspecified: Secondary | ICD-10-CM | POA: Diagnosis present

## 2019-01-31 DIAGNOSIS — Z6832 Body mass index (BMI) 32.0-32.9, adult: Secondary | ICD-10-CM | POA: Diagnosis not present

## 2019-01-31 DIAGNOSIS — I129 Hypertensive chronic kidney disease with stage 1 through stage 4 chronic kidney disease, or unspecified chronic kidney disease: Secondary | ICD-10-CM | POA: Diagnosis present

## 2019-01-31 DIAGNOSIS — E039 Hypothyroidism, unspecified: Secondary | ICD-10-CM | POA: Diagnosis present

## 2019-01-31 DIAGNOSIS — Z7901 Long term (current) use of anticoagulants: Secondary | ICD-10-CM | POA: Diagnosis not present

## 2019-01-31 DIAGNOSIS — Z86010 Personal history of colonic polyps: Secondary | ICD-10-CM | POA: Diagnosis not present

## 2019-01-31 DIAGNOSIS — Z8349 Family history of other endocrine, nutritional and metabolic diseases: Secondary | ICD-10-CM | POA: Diagnosis not present

## 2019-01-31 LAB — CBC
HCT: 43.5 % (ref 39.0–52.0)
Hemoglobin: 14.1 g/dL (ref 13.0–17.0)
MCH: 29.9 pg (ref 26.0–34.0)
MCHC: 32.4 g/dL (ref 30.0–36.0)
MCV: 92.4 fL (ref 80.0–100.0)
Platelets: 201 10*3/uL (ref 150–400)
RBC: 4.71 MIL/uL (ref 4.22–5.81)
RDW: 14.4 % (ref 11.5–15.5)
WBC: 9.4 10*3/uL (ref 4.0–10.5)
nRBC: 0 % (ref 0.0–0.2)

## 2019-01-31 LAB — BASIC METABOLIC PANEL
Anion gap: 8 (ref 5–15)
BUN: 16 mg/dL (ref 6–20)
CO2: 25 mmol/L (ref 22–32)
Calcium: 9 mg/dL (ref 8.9–10.3)
Chloride: 103 mmol/L (ref 98–111)
Creatinine, Ser: 1.3 mg/dL — ABNORMAL HIGH (ref 0.61–1.24)
GFR calc Af Amer: >60 mL/min (ref >60)
GFR calc non Af Amer: >60 mL/min (ref >60)
Glucose, Bld: 135 mg/dL — ABNORMAL HIGH (ref 70–99)
Potassium: 5.1 mmol/L (ref 3.5–5.1)
Sodium: 136 mmol/L (ref 135–145)

## 2019-01-31 MED ORDER — TRAMADOL HCL 50 MG PO TABS: 50.0000 mg | ORAL_TABLET | Freq: Four times a day (QID) | ORAL | 0 refills | Status: DC | PRN

## 2019-01-31 MED ORDER — HYDROCODONE-ACETAMINOPHEN 5-325 MG PO TABS: 1.0000 | ORAL_TABLET | Freq: Four times a day (QID) | ORAL | 0 refills | Status: DC | PRN

## 2019-01-31 MED ORDER — METHOCARBAMOL 500 MG PO TABS: 500.0000 mg | ORAL_TABLET | Freq: Four times a day (QID) | ORAL | 0 refills | Status: DC | PRN

## 2019-01-31 NOTE — Progress Notes (Signed)
D/C instructions given to patient. Patient had no questions. NT or writer will wheel patient out once family brings the car to the front of the building

## 2019-01-31 NOTE — Progress Notes (Signed)
Physical Therapy Treatment Patient Details Name: Isaiah [name], Isaiah [name] MRN: [id number] DOB: Aug 16, 1960 Today's Date: 01/31/2019    History of Present Illness Patient is 59 y.o. male s/p Lt THA anterior approach on 01/30/19 with PMH significant for HTN, HLD, DVT, OA, GERD, and Rt THA.    PT Comments    Pt very motivated and progressing well with mobility.  Pt ambulated in halls, reviewed car transfers and performed HEP with assist - progression and written instruction provided and reviewed - spouse present for entire session.    Follow Up Recommendations  Follow surgeon's recommendation for DC plan and follow-up therapies     Equipment Recommendations  None recommended by PT    Recommendations for Other Services       Precautions / Restrictions Precautions Precautions: Fall Restrictions Weight Bearing Restrictions: No    Mobility  Bed Mobility Overal bed mobility: Needs Assistance Bed Mobility: Supine to Sit     Supine to sit: Min assist     General bed mobility comments: cues for sequence and use of R LE to self assist  Transfers Overall transfer level: Needs assistance Equipment used: Rolling walker (2 wheeled) Transfers: Sit to/from Stand Sit to Stand: Min guard;Supervision         General transfer comment: cues for LE management and use of UEs to self assist  Ambulation/Gait Ambulation/Gait assistance: Min guard;Supervision Gait Distance (Feet): 300 Feet Assistive device: Rolling walker (2 wheeled) Gait Pattern/deviations: Step-to pattern;Step-through pattern;Decreased step length - right;Decreased step length - left;Shuffle;Antalgic Gait velocity: decreased   General Gait Details: cues for posture, position from RW and initial sequence   Stairs Stairs: Yes Stairs assistance: Min guard Stair Management: Two rails;Step to pattern;Forwards Number of Stairs: 7 General stair comments: min cues for sequence   Wheelchair Mobility    Modified Rankin  (Stroke Patients Only)       Balance Overall balance assessment: Needs assistance Sitting-balance support: Feet supported Sitting balance-Leahy Scale: Good     Standing balance support: During functional activity;Bilateral upper extremity supported Standing balance-Leahy Scale: Fair                              Cognition Arousal/Alertness: Awake/alert Behavior During Therapy: WFL for tasks assessed/performed Overall Cognitive Status: Within Functional Limits for tasks assessed                                        Exercises Total Joint Exercises Ankle Circles/Pumps: AROM;Both;Seated;15 reps Quad Sets: AROM;Both;10 reps;Supine Heel Slides: AAROM;Left;20 reps;Supine Hip ABduction/ADduction: AAROM;Left;15 reps;Supine Long Arc Quad: AROM;Left;10 reps;Supine    General Comments        Pertinent Vitals/Pain Pain Assessment: 0-10 Pain Score: 5  Pain Location: Lt hip Pain Descriptors / Indicators: Sore;Aching Pain Intervention(s): Limited activity within patient's tolerance;Monitored during session;Premedicated before session    Home Living                      Prior Function            PT Goals (current goals can now be found in the care plan section) Acute Rehab PT Goals Patient Stated Goal: to get home tomorrow PT Goal Formulation: With patient Time For Goal Achievement: 02/06/19 Potential to Achieve Goals: Good Progress towards PT goals: Progressing toward goals    Frequency    7X/week  PT Plan Current plan remains appropriate    Co-evaluation              AM-PAC PT "6 Clicks" Mobility   Outcome Measure  Help needed turning from your back to your side while in a flat bed without using bedrails?: A Little Help needed moving from lying on your back to sitting on the side of a flat bed without using bedrails?: A Little Help needed moving to and from a bed to a chair (including a wheelchair)?: A  Little Help needed standing up from a chair using your arms (e.g., wheelchair or bedside chair)?: A Little Help needed to walk in hospital room?: A Little Help needed climbing 3-5 steps with a railing? : A Little 6 Click Score: 18    End of Session Equipment Utilized During Treatment: Gait belt Activity Tolerance: Patient tolerated treatment well Patient left: in chair;with call bell/phone within reach;with chair alarm set;with family/visitor present Nurse Communication: Mobility status PT Visit Diagnosis: Muscle weakness (generalized) (M62.81);Difficulty in walking, not elsewhere classified (R26.2)     Time: 2585-2778 PT Time Calculation (min) (ACUTE ONLY): 46 min  Charges:  $Gait Training: 8-22 mins $Therapeutic Exercise: 8-22 mins $Therapeutic Activity: 8-22 mins                     Debe Coder PT Acute Rehabilitation Services Pager [phone] Office [phone]    Isaiah [name] 01/31/2019, 1:01 PM

## 2019-01-31 NOTE — Progress Notes (Signed)
   Subjective: 1 Day Post-Op Procedure(s) (LRB): TOTAL HIP ARTHROPLASTY ANTERIOR APPROACH (Left) Patient reports pain as mild.   Patient seen in rounds with Dr. [name]. Patient is well, and has had no acute complaints or problems. No SOB or chest pain. Voiding well. Positive flatus. Has some soreness in the left hip. Walked 60 feet with PT yesterday.  Plan is to go Home after hospital stay.  Objective: Vital signs in last 24 hours: Temp:  [97.8 F (36.6 C)-99.1 F (37.3 C)] 98.9 F (37.2 C) (01/14 0510) Pulse Rate:  [59-95] 75 (01/14 0510) Resp:  [9-18] 16 (01/14 0510) BP: (101-159)/(72-92) 113/76 (01/14 0510) SpO2:  [93 %-100 %] 98 % (01/14 0510) Weight:  [116.5 kg] 116.5 kg (01/13 1134)  Intake/Output from previous day:  Intake/Output Summary (Last 24 hours) at 01/31/2019 0754 Last data filed at 01/31/2019 0506 Gross per 24 hour  Intake 2257.5 ml  Output 1180 ml  Net 1077.5 ml     Labs: Recent Labs    01/31/19 0423  HGB 14.1   Recent Labs    01/31/19 0423  WBC 9.4  RBC 4.71  HCT 43.5  PLT 201   Recent Labs    01/31/19 0423  NA 136  K 5.1  CL 103  CO2 25  BUN 16  CREATININE 1.30*  GLUCOSE 135*  CALCIUM 9.0   Recent Labs    01/29/19 1339  INR 1.1    EXAM General - Patient is Alert and Oriented Extremity - Neurologically intact Intact pulses distally Dorsiflexion/Plantar flexion intact No cellulitis present Compartment soft Dressing - dressing C/D/I Motor Function - intact, moving foot and toes well on exam.  Hemovac pulled without difficulty.  Past Medical History:  Diagnosis Date  . Arthritis   . Blood dyscrasia    lupus anticoagulant  . Diastolic dysfunction    Grade I per echo in 2/12  . DVT (deep venous thrombosis) (HCC) 2000's   "both legs in the calves; left went all the way up to my groin" (09/17/2012)  . Dysrhythmia   . GERD (gastroesophageal reflux disease)   . History of bronchitis   . Hypercoagulation syndrome (HCC)    secondary to circulating lupus anticoagulant  . Hyperlipidemia   . Hypertension   . Hypothyroidism    "wiped out from taking amiodarone" (09/17/2012)  . Lyme disease    history of   . Obesity (BMI 30-39.9) 07/17/2014  . PAF (paroxysmal atrial fibrillation) (HCC)    no recurrence since Feb 2012  . PONV (postoperative nausea and vomiting)    likes scopolomanine patch  . Pulmonary embolism (HCC) 09/17/2012   "just today; 3 on the right; 2 on the left" (09/17/2012)  . Situational stress   . Sleep apnea    uses CPAP    Assessment/Plan: 1 Day Post-Op Procedure(s) (LRB): TOTAL HIP ARTHROPLASTY ANTERIOR APPROACH (Left) Principal Problem:   OA (osteoarthritis) of hip Active Problems:   Osteoarthritis of left hip  Estimated body mass index is 32.1 kg/m as calculated from the following:   Height as of this encounter: 6\' 3"  (1.905 m).   Weight as of this encounter: 116.5 kg. Advance diet Up with therapy D/C IV fluids when tolerating POs well  DVT Prophylaxis - Xarelto Weight Bearing As Tolerated Hemovac Pulled  Continue therapy today. DC home if he is progressing well and meeting goals. Follow up in office in 2 weeks. Discharge instructions given.   , PA-C Orthopaedic Surgery 01/31/2019, 7:54 AM

## 2019-02-01 NOTE — Discharge Summary (Signed)
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  Admit date: 01/30/2019 Discharge date: 01/31/2019  Primary Diagnosis: Primary osteoarthritis left hip   Admission Diagnoses:  Past Medical History:  Diagnosis Date  . Arthritis   . Blood dyscrasia    lupus anticoagulant  . Diastolic dysfunction    Grade I per echo in 2/12  . DVT (deep venous thrombosis) (HCC) 2000's   "both legs in the calves; left went all the way up to my groin" (09/17/2012)  . Dysrhythmia   . GERD (gastroesophageal reflux disease)   . History of bronchitis   . Hypercoagulation syndrome (HCC)    secondary to circulating lupus anticoagulant  . Hyperlipidemia   . Hypertension   . Hypothyroidism    "wiped out from taking amiodarone" (09/17/2012)  . Lyme disease    history of   . Obesity (BMI 30-39.9) 07/17/2014  . PAF (paroxysmal atrial fibrillation) (HCC)    no recurrence since Feb 2012  . PONV (postoperative nausea and vomiting)    likes scopolomanine patch  . Pulmonary embolism (HCC) 09/17/2012   "just today; 3 on the right; 2 on the left" (09/17/2012)  . Situational stress   . Sleep apnea    uses CPAP   Discharge Diagnoses:   Principal Problem:   OA (osteoarthritis) of hip Active Problems:   Osteoarthritis of left hip  Estimated body mass index is 32.1 kg/m as calculated from the following:   Height as of this encounter: 6\' 3"  (1.905 m).   Weight as of this encounter: 116.5 kg.  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  Onset of symptoms was gradual starting several years ago with gradually worsening course since that time.The patient noted no past surgery on  the left hip(s).  Patient currently rates pain in the left hip at 7 out of 10 with activity. Patient has worsening of pain with activity and weight bearing, pain that interfers with activities of daily living and crepitus. Patient has evidence of advanced osteoarthritis in the left hip though not yet bone-on-bone by imaging studies. This condition presents safety issues increasing the risk of falls. There is no current active infection.   Laboratory Data: Admission on 01/30/2019, Discharged on 01/31/2019  Component Date Value Ref Range Status  . WBC 01/31/2019 9.4  4.0 - 10.5 K/uL Final  . RBC 01/31/2019 4.71  4.22 - 5.81 MIL/uL Final  . Hemoglobin 01/31/2019 14.1  13.0 - 17.0 g/dL Final  . HCT 02/02/2019 43.5  39.0 - 52.0 % Final  . MCV 01/31/2019 92.4  80.0 - 100.0 fL Final  . MCH 01/31/2019 29.9  26.0 - 34.0 pg Final  . MCHC 01/31/2019 32.4  30.0 - 36.0 g/dL Final  . RDW 02/02/2019 14.4  11.5 - 15.5 % Final  . Platelets 01/31/2019 201  150 - 400 K/uL Final  . nRBC 01/31/2019 0.0  0.0 - 0.2 % Final   Performed at [facility], [address] [place], [place]  . Sodium 01/31/2019 136  135 - 145 mmol/L Final  . Potassium 01/31/2019 5.1  3.5 - 5.1 mmol/L Final  . Chloride 01/31/2019 103  98 - 111 mmol/L Final  .  CO2 01/31/2019 25  22 - 32 mmol/L Final  . Glucose, Bld 01/31/2019 135* 70 - 99 mg/dL Final  . BUN 16/10/9602 16  6 - 20 mg/dL Final  . Creatinine, Ser 01/31/2019 1.30* 0.61 - 1.24 mg/dL Final  . Calcium 54/09/8117 9.0  8.9 - 10.3 mg/dL Final  . GFR calc non Af Amer 01/31/2019 >60  >60 mL/min Final  . GFR calc Af Amer 01/31/2019 >60  >60 mL/min Final  . Anion gap 01/31/2019 8  5 - 15 Final   Performed at [facility], [address], [place], [place] [address]  Hospital Outpatient Visit on 01/29/2019  Component Date Value Ref Range Status  . aPTT 01/29/2019 36  24 - 36 seconds Final   Performed at [facility],  [address], [place], [place] [address]  . Prothrombin Time 01/29/2019 14.2  11.4 - 15.2 seconds Final  . INR 01/29/2019 1.1  0.8 - 1.2 Final   Comment: (NOTE) INR goal varies based on device and disease states. Performed at [facility], [address] [place], [place] [address]   . ABO/RH(D) 01/29/2019 O POS   Final  . Antibody Screen 01/29/2019 NEG   Final  . Sample Expiration 01/29/2019 02/02/2019,2359   Final  . Extend sample reason 01/29/2019    Final                   Value:NO TRANSFUSIONS OR PREGNANCY IN THE PAST 3 MONTHS Performed at [facility], [address] [place], [place] [address]   . MRSA, PCR 01/29/2019 NEGATIVE  NEGATIVE Final  . Staphylococcus aureus 01/29/2019 POSITIVE* NEGATIVE Final   Comment: (NOTE) The Xpert SA Assay (FDA approved for NASAL specimens in patients 9 years of age and older), is one component of a comprehensive surveillance program. It is not intended to diagnose infection nor to guide or monitor treatment. Performed at [facility], [address], [place], [place] [address]   Hospital Outpatient Visit on 01/26/2019  Component Date Value Ref Range Status  . SARS-CoV-2, NAA 01/26/2019 NOT DETECTED  NOT DETECTED Final   Comment: (NOTE) This nucleic acid amplification test was developed and its performance characteristics determined by [employer]. Nucleic acid amplification tests include PCR and TMA. This test has not been FDA cleared or approved. This test has been authorized by FDA under an Emergency Use Authorization (EUA). This test is only authorized for the duration of time the declaration that circumstances exist justifying the authorization of the emergency use of in vitro diagnostic tests for detection of SARS-CoV-2 virus and/or diagnosis of COVID-19 infection under section 564(b)(1) of the Act, 21 U.S.C. 841LKG-4(W) (1), unless the authorization is terminated  or revoked sooner. When diagnostic testing is negative, the possibility of a false negative result should be considered in the context of a patient's recent exposures and the presence of clinical signs and symptoms consistent with COVID-19. An individual without symptoms of COVID- 19 and who is not shedding SARS-CoV-2 vi                          rus would expect to have a negative (not detected) result in this assay. Performed At: [facility] [address] [place], [place] [id number] Jolene Schimke MD YQ:[id number]   . Coronavirus Source 01/26/2019 NASOPHARYNGEAL   Final   Performed at [facility], [address], [place], [place] [address]  Lab on 01/25/2019  Component Date Value Ref Range Status  . WBC 01/25/2019 4.3  3.4 - 10.8 x10E3/uL Final  . RBC 01/25/2019 5.24  4.14 - 5.80 x10E6/uL Final  . Hemoglobin 01/25/2019 15.6  13.0 - 17.7 g/dL Final  . Hematocrit 16/10/960401/08/2019 46.1  37.5 - 51.0 % Final  . MCV 01/25/2019 88  79 - 97 fL Final  . MCH 01/25/2019 29.8  26.6 - 33.0 pg Final  . MCHC 01/25/2019 33.8  31.5 - 35.7 g/dL Final  . RDW 54/09/811901/08/2019 14.6  11.6 - 15.4 % Final  . Platelets 01/25/2019 206  150 - 450 x10E3/uL Final  . Neutrophils 01/25/2019 48  Not Estab. % Final  . Lymphs 01/25/2019 36  Not Estab. % Final  . Monocytes 01/25/2019 13  Not Estab. % Final  . Eos 01/25/2019 2  Not Estab. % Final  . Basos 01/25/2019 1  Not Estab. % Final  . Neutrophils Absolute 01/25/2019 2.1  1.4 - 7.0 x10E3/uL Final  . Lymphocytes Absolute 01/25/2019 1.5  0.7 - 3.1 x10E3/uL Final  . Monocytes Absolute 01/25/2019 0.5  0.1 - 0.9 x10E3/uL Final  . EOS (ABSOLUTE) 01/25/2019 0.1  0.0 - 0.4 x10E3/uL Final  . Basophils Absolute 01/25/2019 0.1  0.0 - 0.2 x10E3/uL Final  . Immature Granulocytes 01/25/2019 0  Not Estab. % Final  . Immature Grans (Abs) 01/25/2019 0.0  0.0 - 0.1 x10E3/uL Final  . Glucose 01/25/2019 107* 65 - 99 mg/dL Final  . BUN 14/78/295601/08/2019 29* 6 - 24 mg/dL Final  .  Creatinine, Ser 01/25/2019 1.41* 0.76 - 1.27 mg/dL Final  . GFR calc non Af Amer 01/25/2019 55* >59 mL/min/1.73 Final  . GFR calc Af Amer 01/25/2019 64  >59 mL/min/1.73 Final  . BUN/Creatinine Ratio 01/25/2019 21* 9 - 20 Final  . Sodium 01/25/2019 140  134 - 144 mmol/L Final  . Potassium 01/25/2019 4.5  3.5 - 5.2 mmol/L Final  . Chloride 01/25/2019 103  96 - 106 mmol/L Final  . CO2 01/25/2019 22  20 - 29 mmol/L Final  . Calcium 01/25/2019 9.6  8.7 - 10.2 mg/dL Final  . Total Protein 01/25/2019 6.8  6.0 - 8.5 g/dL Final  . Albumin 21/30/865701/08/2019 4.2  3.8 - 4.9 g/dL Final  . Globulin, Total 01/25/2019 2.6  1.5 - 4.5 g/dL Final  . Albumin/Globulin Ratio 01/25/2019 1.6  1.2 - 2.2 Final  . Bilirubin Total 01/25/2019 0.5  0.0 - 1.2 mg/dL Final  . Alkaline Phosphatase 01/25/2019 122* 39 - 117 IU/L Final  . AST 01/25/2019 50* 0 - 40 IU/L Final  . ALT 01/25/2019 107* 0 - 44 IU/L Final  . Hgb A1c MFr Bld 01/25/2019 5.9* 4.8 - 5.6 % Final   Comment:          Prediabetes: 5.7 - 6.4          Diabetes: >6.4          Glycemic control for adults with diabetes: <7.0   . Est. average glucose Bld gHb Est-m* 01/25/2019 123  mg/dL Final  . Cholesterol, Total 01/25/2019 134  100 - 199 mg/dL Final  . Triglycerides 01/25/2019 239* 0 - 149 mg/dL Final  . HDL 84/69/629501/08/2019 44  >39 mg/dL Final  . VLDL Cholesterol Cal 01/25/2019 38  5 - 40 mg/dL Final  . LDL Chol Calc (NIH) 01/25/2019 52  0 - 99 mg/dL Final  . Chol/HDL Ratio 01/25/2019 3.0  0.0 - 5.0 ratio Final   Comment:  T. Chol/HDL Ratio                                             Men  Women                               1/2 Avg.Risk  3.4    3.3                                   Avg.Risk  5.0    4.4                                2X Avg.Risk  9.6    7.1                                3X Avg.Risk 23.4   11.0   . T3, Total 01/25/2019 80  71 - 180 ng/dL Final  . Free T4 78/29/5621 1.72  0.82 - 1.77 ng/dL Final  . TSH 30/86/5784  0.944  0.450 - 4.500 uIU/mL Final  . Vit D, 25-Hydroxy 01/25/2019 55.7  30.0 - 100.0 ng/mL Final   Comment: Vitamin D deficiency has been defined by the Institute of Medicine and an Endocrine Society practice guideline as a level of serum 25-OH vitamin D less than 20 ng/mL (1,2). The Endocrine Society went on to further define vitamin D insufficiency as a level between 21 and 29 ng/mL (2). 1. IOM (Institute of Medicine). 2010. Dietary reference    intakes for calcium and D. [place]: The    [employer]. 2. Holick MF, Binkley [place], Bischoff-Ferrari HA, et al.    Evaluation, treatment, and prevention of vitamin D    deficiency: an Endocrine Society clinical practice    guideline. JCEM. 2011 Jul; 96(7):1911-30.      X-Rays:DG Pelvis Portable  Result Date: 01/30/2019 CLINICAL DATA:  Postop EXAM: PORTABLE PELVIS 1-2 VIEWS COMPARISON:  11/26/2014 FINDINGS: Previous right hip replacement. Interval left hip replacement with intact hardware and normal alignment. Surgical drain over the left hip with small soft tissue gas consistent with recent surgery IMPRESSION: Status post left hip replacement with expected postsurgical change Electronically Signed   By: Jasmine Pang M.D.   On: 01/30/2019 15:43   DG C-Arm 1-60 Min-No Report  Result Date: 01/30/2019 Fluoroscopy was utilized by the requesting physician.  No radiographic interpretation.   DG HIP OPERATIVE UNILAT W OR W/O PELVIS LEFT  Result Date: 01/30/2019 CLINICAL DATA:  Left hip replacement. EXAM: OPERATIVE left HIP (WITH PELVIS IF PERFORMED) 4 VIEWS TECHNIQUE: Fluoroscopic spot image(s) were submitted for interpretation post-operatively. Radiation exposure index: 2.5606 mGy. COMPARISON:  November 26, 2014. FINDINGS: The left acetabular and femoral components are well situated. IMPRESSION: Status post left total hip arthroplasty hip arthroplasty. Electronically Signed   By: Lupita Raider M.D.   On: 01/30/2019 15:12     EKG: Orders placed or performed in visit on 11/16/18  . EKG 12-Lead     Hospital Course: Patient was admitted to [facility] and taken to the OR and underwent the above state procedure without complications.  Patient tolerated the  procedure well and was later transferred to the recovery room and then to the orthopaedic floor for postoperative care.  They were given PO and IV analgesics for pain control following their surgery.  They were given 24 hours of postoperative antibiotics of  Anti-infectives (From admission, onward)   Start     Dose/Rate Route Frequency Ordered Stop   01/30/19 2030  ceFAZolin (ANCEF) IVPB 2g/100 mL premix     2 g 200 mL/hr over 30 Minutes Intravenous Every 6 hours 01/30/19 1627 01/31/19 0524   01/30/19 1130  ceFAZolin (ANCEF) IVPB 2g/100 mL premix     2 g 200 mL/hr over 30 Minutes Intravenous On call to O.R. 01/30/19 1121 01/30/19 1356     and started on DVT prophylaxis in the form of Aspirin.   PT and OT were ordered for total hip protocol.  The patient was allowed to be WBAT with therapy. Discharge planning was consulted to help with postop disposition and equipment needs.  Patient had a good night on the evening of surgery.  They started to get up OOB with therapy on day one.  Hemovac drain was pulled without difficulty. The patient had progressed with therapy and meeting their goals.  Incision was healing well.  Patient was seen in rounds and was ready to go home.  Diet: Cardiac diet Activity:WBAT Follow-up:in 2 weeks Disposition - Home Discharged Condition: stable   Discharge Instructions    Call MD / Call 911   Complete by: As directed    If you experience chest pain or shortness of breath, CALL 911 and be transported to the hospital emergency room.  If you develope a fever above 101 F, pus (white drainage) or increased drainage or redness at the wound, or calf pain, call your surgeon's office.   Constipation Prevention   Complete by: As  directed    Drink plenty of fluids.  Prune juice may be helpful.  You may use a stool softener, such as Colace (over the counter) 100 mg twice a day.  Use MiraLax (over the counter) for constipation as needed.   Diet - low sodium heart healthy   Complete by: As directed    Discharge instructions   Complete by: As directed    Dr. Gaynelle Arabian Total Joint Specialist [facility] [address], [place], [place] [address] [phone]  ANTERIOR APPROACH TOTAL HIP REPLACEMENT POSTOPERATIVE DIRECTIONS     Hip Rehabilitation, Guidelines Following Surgery  The results of a hip operation are greatly improved after range of motion and muscle strengthening exercises. Follow all safety measures which are given to protect your hip. If any of these exercises cause increased pain or swelling in your joint, decrease the amount until you are comfortable again. Then slowly increase the exercises. Call your caregiver if you have problems or questions.   HOME CARE INSTRUCTIONS  Remove items at home which could result in a fall. This includes throw rugs or furniture in walking pathways.  ICE to the affected hip as frequently as 20-30 minutes an hour and then as needed for pain and swelling. Continue to use ice on the hip for pain and swelling from surgery. You may notice swelling that will progress down to the foot and ankle. This is normal after surgery. Elevate the leg when you are not up walking on it.   Continue to use the breathing machine which will help keep your temperature down.  It is common for your temperature to cycle up and down  following surgery, especially at night when you are not up moving around and exerting yourself.  The breathing machine keeps your lungs expanded and your temperature down.  DIET You may resume your previous home diet once your are discharged from the hospital.  DRESSING / WOUND CARE / SHOWERING You may remove the adhesive bandage 2 days following surgery.  Leave the tape strips across the incision in place until your first follow-up appointment. Cover the incision with a 4x4 gauze and paper tape. Change the dressing daily with new gauze and paper tape for 7-10 days following surgery. You may begin showering 3 days following surgery, but do not submerge the incision under water. Allow water and soap to run over the incision, pat dry, and apply a fresh gauze dressing.  ACTIVITY For the first 3-5 days, it is important to rest and keep the operative leg elevated. You should, as a general rule, rest for 50 minutes and walk/stretch for 10 minutes per hour. After 5 days, you may slowly increase activity as tolerated.  Perform the exercises you were provided twice a day for about 15-20 minutes each session. Begin these 2 days following surgery. Walk with your walker as instructed. Use the walker until you are comfortable transitioning to a cane. Walk with the cane in the opposite hand of the operative leg. You may discontinue the cane once you are comfortable and walking steadily. Avoid periods of inactivity such as sitting longer than an hour when not asleep. This helps prevent blood clots.  Do not drive a car for 6 weeks or until released by your surgeon.  Do not drive while taking narcotics.  TED HOSE STOCKINGS Wear the elastic stockings on both legs for three weeks following surgery during the day. You may remove them at night while sleeping.  WEIGHT BEARING Weight bearing as tolerated with assist device (walker, cane, etc) as directed, use it as long as suggested by your surgeon or therapist, typically at least 4-6 weeks.  POSTOPERATIVE CONSTIPATION PROTOCOL Constipation - defined medically as fewer than three stools per week and severe constipation as less than one stool per week.  One of the most common issues patients have following surgery is constipation.  Even if you have a regular bowel pattern at home, your normal regimen is likely to be  disrupted due to multiple reasons following surgery.  Combination of anesthesia, postoperative narcotics, change in appetite and fluid intake all can affect your bowels.  In order to avoid complications following surgery, here are some recommendations in order to help you during your recovery period.  Colace (docusate) - Pick up an over-the-counter form of Colace or another stool softener and take twice a day as long as you are requiring postoperative pain medications.  Take with a full glass of water daily.  If you experience loose stools or diarrhea, hold the colace until you stool forms back up.  If your symptoms do not get better within 1 week or if they get worse, check with your doctor. Dulcolax (bisacodyl) - Pick up over-the-counter and take as directed by the product packaging as needed to assist with the movement of your bowels.  Take with a full glass of water.  Use this product as needed if not relieved by Colace only.  MiraLax (polyethylene glycol) - Pick up over-the-counter to have on hand.  MiraLax is a solution that will increase the amount of water in your bowels to assist with bowel movements.  Take as directed and can mix  with a glass of water, juice, soda, coffee, or tea.  Take if you go more than two days without a movement.Do not use MiraLax more than once per day. Call your doctor if you are still constipated or irregular after using this medication for 7 days in a row.  If you continue to have problems with postoperative constipation, please contact the office for further assistance and recommendations.  If you experience "the worst abdominal pain ever" or develop nausea or vomiting, please contact the office immediatly for further recommendations for treatment.  ITCHING  If you experience itching with your medications, try taking only a single pain pill, or even half a pain pill at a time.  You can also use Benadryl over the counter for itching or also to help with sleep.    MEDICATIONS See your medication summary on the "After Visit Summary" that the nursing staff will review with you prior to discharge.  You may have some home medications which will be placed on hold until you complete the course of blood thinner medication.  It is important for you to complete the blood thinner medication as prescribed by your surgeon.  Continue your approved medications as instructed at time of discharge.  PRECAUTIONS If you experience chest pain or shortness of breath - call 911 immediately for transfer to the hospital emergency department.  If you develop a fever greater that 101 F, purulent drainage from wound, increased redness or drainage from wound, foul odor from the wound/dressing, or calf pain - CONTACT YOUR SURGEON.                                                   FOLLOW-UP APPOINTMENTS Make sure you keep all of your appointments after your operation with your surgeon and caregivers. You should call the office at the above phone number and make an appointment for approximately two weeks after the date of your surgery or on the date instructed by your surgeon outlined in the "After Visit Summary".  RANGE OF MOTION AND STRENGTHENING EXERCISES  These exercises are designed to help you keep full movement of your hip joint. Follow your caregiver's or physical therapist's instructions. Perform all exercises about fifteen times, three times per day or as directed. Exercise both hips, even if you have had only one joint replacement. These exercises can be done on a training (exercise) mat, on the floor, on a table or on a bed. Use whatever works the best and is most comfortable for you. Use music or television while you are exercising so that the exercises are a pleasant break in your day. This will make your life better with the exercises acting as a break in routine you can look forward to.  Lying on your back, slowly slide your foot toward your buttocks, raising your knee up  off the floor. Then slowly slide your foot back down until your leg is straight again.  Lying on your back spread your legs as far apart as you can without causing discomfort.  Lying on your side, raise your upper leg and foot straight up from the floor as far as is comfortable. Slowly lower the leg and repeat.  Lying on your back, tighten up the muscle in the front of your thigh (quadriceps muscles). You can do this by keeping your leg straight and trying to  raise your heel off the floor. This helps strengthen the largest muscle supporting your knee.  Lying on your back, tighten up the muscles of your buttocks both with the legs straight and with the knee bent at a comfortable angle while keeping your heel on the floor.   IF YOU ARE TRANSFERRED TO A SKILLED REHAB FACILITY If the patient is transferred to a skilled rehab facility following release from the hospital, a list of the current medications will be sent to the facility for the patient to continue.  When discharged from the skilled rehab facility, please have the facility set up the patient's Home Health Physical Therapy prior to being released. Also, the skilled facility will be responsible for providing the patient with their medications at time of release from the facility to include their pain medication, the muscle relaxants, and their blood thinner medication. If the patient is still at the rehab facility at time of the two week follow up appointment, the skilled rehab facility will also need to assist the patient in arranging follow up appointment in our office and any transportation needs.  MAKE SURE YOU:  Understand these instructions.  Get help right away if you are not doing well or get worse.    Pick up stool softner and laxative for home use following surgery while on pain medications. Do not submerge incision under water. Please use good hand washing techniques while changing dressing each day. May shower starting three days  after surgery. Please use a clean towel to pat the incision dry following showers. Continue to use ice for pain and swelling after surgery. Do not use any lotions or creams on the incision until instructed by your surgeon.   Increase activity slowly as tolerated   Complete by: As directed      Allergies as of 01/31/2019      Reactions   Crestor [rosuvastatin Calcium] Other (See Comments)   hepatitis   Lipitor [atorvastatin Calcium] Other (See Comments)   Feels like someone hit him with 2x4      Medication List    TAKE these medications   acetaminophen 500 MG tablet Commonly known as: TYLENOL Take 1,000 mg by mouth every 6 (six) hours as needed (for pain.).   ALPRAZolam 0.25 MG tablet Commonly known as: XANAX Take 1 tablet by mouth 2 (two) times daily.   amLODipine 5 MG tablet Commonly known as: NORVASC TAKE 1 TABLET BY MOUTH DAILY   ezetimibe 10 MG tablet Commonly known as: ZETIA TAKE 1 TABLET BY MOUTH DAILY What changed: when to take this   fluocinonide 0.05 % external solution Commonly known as: LIDEX Apply 1 application topically daily as needed (scalp irritation.).   HYDROcodone-acetaminophen 5-325 MG tablet Commonly known as: NORCO/VICODIN Take 1-2 tablets by mouth every 6 (six) hours as needed for moderate pain (pain score 4-6).   lisinopril 20 MG tablet Commonly known as: ZESTRIL TAKE 1 TABLET BY MOUTH TWICE DAILY   methocarbamol 500 MG tablet Commonly known as: ROBAXIN Take 1 tablet (500 mg total) by mouth every 6 (six) hours as needed for muscle spasms.   metoprolol succinate 25 MG 24 hr tablet Commonly known as: TOPROL-XL TAKE 1 TABLET BY MOUTH EVERY MORNING What changed: when to take this   omega-3 acid ethyl esters 1 g capsule Commonly known as: LOVAZA TAKE 1 CAPSULE BY MOUTH TWICE DAILY   omeprazole 20 MG capsule Commonly known as: PRILOSEC Take 1 capsule (20 mg total) by mouth daily.   propafenone  325 MG 12 hr capsule Commonly known as:  RYTHMOL SR TAKE 1 CAPSULE BY MOUTH TWICE DAILY   Repatha SureClick 140 MG/ML Soaj Generic drug: Evolocumab INJECT 1 PEN INTO THE SKIN EVERY 14 DAYS What changed: See the new instructions.   Synthroid 150 MCG tablet Generic drug: levothyroxine Take 300 mcg by mouth daily.   tamsulosin 0.4 MG Caps capsule Commonly known as: FLOMAX Take 0.4 mg by mouth every evening.   traMADol 50 MG tablet Commonly known as: ULTRAM Take 1-2 tablets (50-100 mg total) by mouth every 6 (six) hours as needed for moderate pain.   Xarelto 20 MG Tabs tablet Generic drug: rivaroxaban TAKE 1 TABLET BY MOUTH DAILY What changed:   how much to take  when to take this      Follow-up Information    Ollen Gross, MD. Schedule an appointment as soon as possible for a visit on 02/14/2019.   Specialty: Orthopedic Surgery Contact information: [address] [place] [address] [place] [address] [phone]           Signed: Dimitri Ped, PA-C Orthopaedic Surgery 02/01/2019, 9:37 AM

## 2019-02-07 ENCOUNTER — Telehealth: Payer: Self-pay

## 2019-02-07 NOTE — Telephone Encounter (Addendum)
**Note De-Identified Isaiah [name] Obfuscation** Letter received from [facility] stating that they denied the pts Omega-3-Acid-Ethyl Esters. Reason:  The pt must first try and fail 2 alternatives on his plans formulary (the pt may need to call plan to get list of preferred meds if he does not have a copy himself) .  Will forward to Dr Elease Hashimoto and his nurse for advisement to the pt.

## 2019-02-11 NOTE — Telephone Encounter (Signed)
Called patient to ask about Lovaza and he states he has not taken it in about 6 months. He states at a recent appointment with Dr. Sharee Holster, they discussed the medication and attempted to refill it. He states he does not want to pay out of pocket for it and we can remove it from his list. He thanked me for the call.

## 2019-02-15 DIAGNOSIS — Z96642 Presence of left artificial hip joint: Secondary | ICD-10-CM | POA: Insufficient documentation

## 2019-02-25 DIAGNOSIS — N1831 Chronic kidney disease, stage 3a: Secondary | ICD-10-CM | POA: Diagnosis not present

## 2019-02-25 DIAGNOSIS — E785 Hyperlipidemia, unspecified: Secondary | ICD-10-CM | POA: Diagnosis not present

## 2019-02-25 DIAGNOSIS — I480 Paroxysmal atrial fibrillation: Secondary | ICD-10-CM | POA: Diagnosis not present

## 2019-02-25 DIAGNOSIS — E039 Hypothyroidism, unspecified: Secondary | ICD-10-CM | POA: Diagnosis not present

## 2019-03-08 DIAGNOSIS — Z96642 Presence of left artificial hip joint: Secondary | ICD-10-CM | POA: Diagnosis not present

## 2019-03-08 DIAGNOSIS — Z471 Aftercare following joint replacement surgery: Secondary | ICD-10-CM | POA: Diagnosis not present

## 2019-03-11 ENCOUNTER — Other Ambulatory Visit: Payer: Self-pay | Admitting: Cardiovascular Disease

## 2019-03-29 ENCOUNTER — Ambulatory Visit (INDEPENDENT_AMBULATORY_CARE_PROVIDER_SITE_OTHER): Payer: BC Managed Care – PPO | Admitting: Cardiovascular Disease

## 2019-03-29 ENCOUNTER — Encounter: Payer: Self-pay | Admitting: Cardiovascular Disease

## 2019-03-29 ENCOUNTER — Other Ambulatory Visit: Payer: Self-pay

## 2019-03-29 VITALS — BP 112/78 | HR 73 | Ht 75.0 in | Wt 268.8 lb

## 2019-03-29 DIAGNOSIS — I2699 Other pulmonary embolism without acute cor pulmonale: Secondary | ICD-10-CM | POA: Diagnosis not present

## 2019-03-29 DIAGNOSIS — E785 Hyperlipidemia, unspecified: Secondary | ICD-10-CM | POA: Diagnosis not present

## 2019-03-29 DIAGNOSIS — I480 Paroxysmal atrial fibrillation: Secondary | ICD-10-CM

## 2019-03-29 NOTE — Progress Notes (Signed)
CARDIOLOGY OFFICE NOTE  Date:  03/29/2019    [name], [name] Date of Birth: 02-May-1960 Medical Record #[id number]  PCP:  [name], DO  Cardiologist:  Former patient of Dr. Yevonne Pax.   Now Kenard Morawski  Chief Complaint  Patient presents with  . Atrial Fibrillation  . Hypertension   Problem list 1. History of recurrent pulmonary embolus 2. Hypercoagulability - Lupus anticoagulant  3. Hypertension 4. Osteoarthritis-status post hip replacements 5.  Paroxysmal atrial fibrillation 6. 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 Former patient of Dr. Yevonne Pax.   He has a history of prior PE and found to have a hypercoagulability state with abnormal lupus anticoagulant - he is committed to lifelong anticoagulation and is on Xarelto. He has had frequent DVT, PAF, hypothyroidism, HLD and HTN.  He has sleep apnea and is on CPAP - followed by Dr. Mayford Knife.   In January 2014 the patient experienced some chest tightness and underwent a treadmill Myoview stress test which showed no evidence of ischemia and his ejection fraction was 53% with no wall motion abnormalities.   Last seen in November. Cardiac status was stable.   Comes back today. Here alone. He is doing well. He had his hip replacement back in November - did well. Back on track with losing weight - says he is down 20 pounds. BP is lower. No chest pain. Breathing is good. Rhythm is good. Asking about reduction of medicines. His significant other has been diagnosed with breast cancer and he has been helping with her.    June 19, 2015:  Isaiah [name] is seen today for initial visit.   He is a former patient of [facility]. Has hx of PAF - none in the past 6-8 years.    Seems to be triggered by large doses of caffiene.  Has cardioverted on 1 occasion but usually he will convert back into NSR after a day or so .   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   No weight lifting due to a recent hernia repair .   Dec. 1, 2017:  Isaiah [name] is seen today in follow-up for his paroxysmal atrial fib ablation, diastolic dysfunction, and hypertension. No palps  June 21, 2016: Still very active.  Used to lift heavy weight and fight MMA . Does cardio regularly. Does interval  , ellipitical  Dental practice is going well.    No episodes of atrial fib - none in the past 6-8 years  Has hx of DVT ,  He walks frequently when flying   August 04, 2016:  Isaiah [name] is seen today for work in visit. He started having episodes of chest pain last week.  He went on vacation a week ago Ate too much salt, Came home ,  Took Lasix for swelling  Had a non productive cough.   Chest tightness.   Pressure like sensation No pleuretic CP,  Difficult to take a deep breath No fever , dry cough.  Felt a bit better after Z-pac.  But chest pressure persisted.  Feels different from his pulmonary embolus pain   Echo in 2014 showed normal LV function   Sept. 12, 2018:  Isaiah [name] is seen back after having an episode of chest pain  Coronary CT angio shows a prox LAD stenosis of 30%.  Calcium score of 44 (  72 percentile for age )  On Zetia,  Had problems with crestor ( elevated liver enzymes)  and Lipitor ( profound muscle aches )  .   Feeling better.  Still working out . No CP   May 19, 2017:   Doing well. Went on [employer] and lost 20 lbs.   Feeling well .  Has not had any recurrent PAF  - last episode was 10 years ago   November 17, 2017:  Isaiah [name] is seen today for follow-up of his paroxysmal atrial fibrillation.  He also has a hypercoagulable state and is on Xarelto. Is a history of very mild coronary artery disease.  Coronary CT angiogram showed a proximal LAD stenosis of approximately 30%.  Coronary calcium score is 44.  He is on Zetia and Repatha. Very busy at work .   Has not been working out as much .  Has OSA - wears his CPAP ,   Seems to be working well   March 29, 2019:  Isaiah [name] is seen today for follow-up of his paroxysmal atrial fibrillation and hypercoagulable state.  He remains on Xarelto.  He has very mild coronary artery disease.  Coronary CT angiogram showed a proximal stenosis of approximately 30%.  His coronary calcium score is 44.  Remains on Zetia and Repatha. Had a hip replacement this past year  No covid   Past Medical History:  Diagnosis Date  . Arthritis   . Blood dyscrasia    lupus anticoagulant  . Diastolic dysfunction    Grade I per echo in 2/12  . DVT (deep venous thrombosis) (HCC) 2000's   "both legs in the calves; left went all the way up to my groin" (09/17/2012)  . Dysrhythmia   . GERD (gastroesophageal reflux disease)   . History of bronchitis   . Hypercoagulation syndrome (HCC)    secondary to circulating lupus anticoagulant  . Hyperlipidemia   . Hypertension   . Hypothyroidism    "wiped out from taking amiodarone" (09/17/2012)  . Lyme disease    history of   . Obesity (BMI 30-39.9) 07/17/2014  . PAF (paroxysmal atrial fibrillation) (HCC)    no recurrence since Feb 2012  . PONV (postoperative nausea and vomiting)    likes scopolomanine patch  . Pulmonary embolism (HCC) 09/17/2012   "just today; 3 on the right; 2 on the left" (09/17/2012)  . Situational stress   . Sleep apnea    uses CPAP    Past Surgical History:  Procedure Laterality Date  . CARDIOVASCULAR STRESS TEST  07/17/2009   EF 59%  . CARDIOVERSION    . DISTAL BICEPS TENDON REPAIR Right 2011  . INGUINAL HERNIA REPAIR Right 1992  . INGUINAL HERNIA REPAIR Left 2013  . INSERTION OF MESH N/A 05/14/2015   Procedure: INSERTION OF MESH;  Surgeon: Avel Peace, MD;  Location: [facility];  Service: General;  Laterality: N/A;  . SHOULDER ARTHROSCOPY W/ ROTATOR CUFF REPAIR Left 09/04/2012  . SHOULDER SURGERY     left  . TOTAL HIP ARTHROPLASTY Right 11/26/2014   Procedure: RIGHT TOTAL HIP ARTHROPLASTY ANTERIOR APPROACH;  Surgeon:  Ollen Gross, MD;  Location: [facility];  Service: Orthopedics;  Laterality: Right;  . TOTAL HIP ARTHROPLASTY Left 01/30/2019   Procedure: TOTAL HIP ARTHROPLASTY ANTERIOR APPROACH;  Surgeon: Ollen Gross, MD;  Location: [facility];  Service: Orthopedics;  Laterality: Left;   . TRANSTHORACIC ECHOCARDIOGRAM  03/05/2010   EF 60-65%  . UMBILICAL HERNIA REPAIR N/A 05/14/2015   Procedure: HERNIA  REPAIR UMBILICAL ADULT WITH MESH ;  Surgeon: Jackolyn Confer, MD;  Location: [facility];  Service: General;  Laterality: N/A;  . WISDOM TOOTH EXTRACTION     lower 2     Medications: Current Outpatient Medications  Medication Sig Dispense Refill  . acetaminophen (TYLENOL) 500 MG tablet Take 1,000 mg by mouth every 6 (six) hours as needed (for pain.).    Marland Kitchen ALPRAZolam (XANAX) 0.25 MG tablet Take 1 tablet by mouth 2 (two) times daily.   4  . amLODipine (NORVASC) 5 MG tablet TAKE 1 TABLET BY MOUTH DAILY 90 tablet 3  . ezetimibe (ZETIA) 10 MG tablet TAKE 1 TABLET BY MOUTH DAILY 90 tablet 1  . fluocinonide (LIDEX) 0.05 % external solution Apply 1 application topically daily as needed (scalp irritation.).     Marland Kitchen HYDROcodone-acetaminophen (NORCO/VICODIN) 5-325 MG tablet Take 1-2 tablets by mouth every 6 (six) hours as needed for moderate pain (pain score 4-6). 40 tablet 0  . lisinopril (ZESTRIL) 20 MG tablet TAKE 1 TABLET BY MOUTH TWICE DAILY 180 tablet 0  . methocarbamol (ROBAXIN) 500 MG tablet Take 1 tablet (500 mg total) by mouth every 6 (six) hours as needed for muscle spasms. 40 tablet 0  . metoprolol succinate (TOPROL-XL) 25 MG 24 hr tablet TAKE 1 TABLET BY MOUTH EACH MORNING 90 tablet 2  . omeprazole (PRILOSEC) 20 MG capsule Take 1 capsule (20 mg total) by mouth daily. 30 capsule 0  . propafenone (RYTHMOL SR) 325 MG 12 hr capsule TAKE 1 CAPSULE BY MOUTH TWICE DAILY 180 capsule 3  . REPATHA SURECLICK 177 MG/ML SOAJ INJECT 1 PEN INTO THE SKIN EVERY 14 DAYS 2 pen 11  . SYNTHROID 150 MCG tablet Take 300 mcg by mouth  daily.   0  . tamsulosin (FLOMAX) 0.4 MG CAPS capsule Take 0.4 mg by mouth every evening.     . traMADol (ULTRAM) 50 MG tablet Take 1-2 tablets (50-100 mg total) by mouth every 6 (six) hours as needed for moderate pain. 40 tablet 0  . XARELTO 20 MG TABS tablet TAKE 1 TABLET BY MOUTH DAILY 90 tablet 1   No current facility-administered medications for this visit.    Allergies: Allergies  Allergen Reactions  . Crestor [Rosuvastatin Calcium] Other (See Comments)    hepatitis  . Lipitor [Atorvastatin Calcium] Other (See Comments)    Feels like someone hit him with 2x4    Social History: The patient  reports that he has never smoked. He has never used smokeless tobacco. He reports current alcohol use of about 8.0 standard drinks of alcohol per week. He reports that he does not use drugs.   Family History: The patient's family history includes Cancer in his brother and mother; Heart attack in his maternal grandfather; Hyperlipidemia in his mother; Hypertension in his brother, father, and mother.   Review of Systems: Please see the history of present illness.   Otherwise, the review of systems is positive for none.   All other systems are reviewed and negative.   Physical Exam: Height 6\' 3"  (1.905 m), weight 268 lb 12.8 oz (121.9 kg).  GEN:  Well nourished, well developed in no acute distress HEENT: Normal NECK: No JVD; No carotid bruits LYMPHATICS: No lymphadenopathy CARDIAC: RRR,    RESPIRATORY:  Clear to auscultation without rales, wheezing or rhonchi  ABDOMEN: Soft, non-tender, non-distended MUSCULOSKELETAL:  No edema; No deformity  SKIN: Warm and dry NEUROLOGIC:  Alert and oriented x 3    LABORATORY DATA:  EKG:  Lab Results  Component Value Date   WBC 9.4 01/31/2019   HGB 14.1 01/31/2019   HCT 43.5 01/31/2019   PLT 201 01/31/2019   GLUCOSE 135 (H) 01/31/2019   CHOL 134 01/25/2019   TRIG 239 (H) 01/25/2019   HDL 44 01/25/2019   LDLCALC 52 01/25/2019   ALT 107  (H) 01/25/2019   AST 50 (H) 01/25/2019   NA 136 01/31/2019   K 5.1 01/31/2019   CL 103 01/31/2019   CREATININE 1.30 (H) 01/31/2019   BUN 16 01/31/2019   CO2 25 01/31/2019   TSH 0.944 01/25/2019   PSA 3.69 10/05/2016   INR 1.1 01/29/2019   HGBA1C 5.9 (H) 01/25/2019    BNP (last 3 results) No results for input(s): BNP in the last 8760 hours.  ProBNP (last 3 results) No results for input(s): PROBNP in the last 8760 hours.   Other Studies Reviewed Today:  Echo Study Conclusions from 2014  Left ventricle: The cavity size was normal. Wall thickness was increased in a pattern of mild LVH. Systolic function was normal. The estimated ejection fraction was in the range of 55% to 60%. There was an increased relative contribution of atrial contraction to ventricular filling.   Myoview Impression from 2014 Exercise Capacity: Good exercise capacity. BP Response: Normal blood pressure response. Clinical Symptoms: Mild dyspnea ECG Impression: No significant ST segment change suggestive of ischemia. Comparison with Prior Nuclear Study: No significant change from previous study  Overall Impression: Low risk stress nuclear study. No evidence for ischemia or infarction. EF mildly decreased.   LV Ejection Fraction: 53%. LV Wall Motion: Mild global hypokinesis.   ECG:      Assessment/Plan:  1. -  Chest tightness:      No further episodes of chest   2. Hypercoagulable syndrome :    On Xarelto ,    3. Paroxysmal atrial fibrillation,     Cont RythmolSR 325 BID   4. Hypercholesterolemia -   On repatha and zetia.   Trigs were elevated during his last blood draw in January.  He admits to gaining some weight.  I encouraged him to get back onto good diet, exercise, weight loss program.  5. Essential hypertension -   continue current medications.  Blood pressure is well controlled.  6. Hypothyroidism followed by Dr. Evlyn Kanner  7. Osteoarthritis of right hip.  .  Status post  hip replacement.  Current medicines are reviewed with the patient today.  The patient does not have concerns regarding medicines other than what has been noted above.  Patient is agreeable to this plan and will call if any problems develop in the interim.   Will see him in 6 months .   Kristeen Miss, MD  03/29/2019 8:12 AM    [facility] [address] [place],  [address] [place], [place]  [address] Pager [phone] Phone: [phone]; Fax: [phone]

## 2019-03-29 NOTE — Patient Instructions (Addendum)
Medication Instructions:  *If you need a refill on your cardiac medications before your next appointment, please call your pharmacy*  Lab Work: Your physician recommends that you return for lab work in:4 months at office visit or before. Please be fasting for these labs. Lipid/Liver panel and BMET.  If you have labs (blood work) drawn today and your tests are completely normal, you will receive your results only by: Marland Kitchen MyChart Message (if you have MyChart) OR . A paper copy in the mail If you have any lab test that is abnormal or we need to change your treatment, we will call you to review the results.  Follow-Up: At [facility], you and your health needs are our priority.  As part of our continuing mission to provide you with exceptional heart care, we have created designated Provider Care Teams.  These Care Teams include your primary Cardiologist (physician) and Advanced Practice Providers (APPs -  Physician Assistants and Nurse Practitioners) who all work together to provide you with the care you need, when you need it.  We recommend signing up for the patient portal called "MyChart".  Sign up information is provided on this After Visit Summary.  MyChart is used to connect with patients for Virtual Visits (Telemedicine).  Patients are able to view lab/test results, encounter notes, upcoming appointments, etc.  Non-urgent messages can be sent to your provider as well.   To learn more about what you can do with MyChart, go to [email or web address].    Your next appointment:   4 month(s)  The format for your next appointment:   In Person  Provider:   You may see Kristeen Miss, MD or one of the following Advanced Practice Providers on your designated Care Team:    Tereso Newcomer, PA-C  [place], [place]  [place], [place]

## 2019-04-15 ENCOUNTER — Other Ambulatory Visit: Payer: Self-pay | Admitting: Cardiovascular Disease

## 2019-04-15 DIAGNOSIS — G4733 Obstructive sleep apnea (adult) (pediatric): Secondary | ICD-10-CM | POA: Diagnosis not present

## 2019-04-15 NOTE — Telephone Encounter (Signed)
Age 59, weight 121.9kg, SCr 1.3 on 01/31/19, CrCl > 100, afib/VTE/lupus indication, last OV March 2021

## 2019-04-23 ENCOUNTER — Telehealth: Payer: Self-pay | Admitting: Cardiovascular Disease

## 2019-04-23 ENCOUNTER — Telehealth: Payer: Self-pay

## 2019-04-23 NOTE — Telephone Encounter (Signed)
Pt called stating that his blood pressure currently is 155/101.  Pt denies any symptoms though.  Per Dr. Sharee Holster, advised pt to continue with his blood pressure medications and checking blood pressures at home and if they continue to consistently run high, he should contact his cardiologist.  Pt further stated that his heart was "skipping" two days ago, but that he does not feel like his heart is out of rhythm.  Advised pt that it would be best for him to contact his cardiologist for recommendations.  Pt expressed understanding and is agreeable.  Tiajuana Amass, CMA

## 2019-04-23 NOTE — Telephone Encounter (Signed)
Patient states that over the weekend he did not feel great. This morning he took his blood pressure it was 142/96. When he got to work he was still not feeling well and rechecked his BP it was 155/101. Patient went home to rest and took an extra dose of lisinopril 20 mg. He states his most recent blood pressure was 128/78. His HR has been running 75-80 but he does not feel that he has been out of rhythm. He denies any chest pain, dizziness, shortness of breath or headaches. He states that he was on a keto diet but was out of town over the weekend and ate normally. He also states that Friday night he did have a bit too much to drink and ended up taking medications much later than normal on Saturday.  Advised patient to continue medication as prescribed and continue to check his BP. If blood pressure continues to be increased or he develops any symptoms then he will let us know.

## 2019-04-23 NOTE — Telephone Encounter (Signed)
New Message    Pt c/o BP issue:  1. What are your last 5 BP readings? 155/101, 128/78(after he took an additional lisinprol today) all readings are from today.  2. Are you having any other symptoms (ex. Dizziness, headache, blurred vision, passed out)? Patient states that he had a slight headache and he was just not feeling good today. So he has been at home rest.  3. What is your medication issue?

## 2019-04-24 NOTE — Telephone Encounter (Signed)
Agree with Nicholes Calamity, RN. I think his elevated BP is due to eating and drinking more than he typically does. Continue to monitor and let us know if his bp remains elevated.

## 2019-04-24 NOTE — Telephone Encounter (Signed)
Follow up ° ° °Patient is returning your call. Please call. ° ° ° °

## 2019-04-24 NOTE — Telephone Encounter (Signed)
lpmtcb 4/7 

## 2019-04-24 NOTE — Telephone Encounter (Signed)
I spoke with the patient and gave him Dr Harvie Bridge recommendation.  He verbalized understanding and will keep [place] updated.

## 2019-04-26 DIAGNOSIS — I2699 Other pulmonary embolism without acute cor pulmonale: Secondary | ICD-10-CM | POA: Diagnosis not present

## 2019-04-26 DIAGNOSIS — R1084 Generalized abdominal pain: Secondary | ICD-10-CM | POA: Diagnosis not present

## 2019-04-26 DIAGNOSIS — N1831 Chronic kidney disease, stage 3a: Secondary | ICD-10-CM | POA: Diagnosis not present

## 2019-04-26 DIAGNOSIS — R112 Nausea with vomiting, unspecified: Secondary | ICD-10-CM | POA: Diagnosis not present

## 2019-06-06 DIAGNOSIS — R972 Elevated prostate specific antigen [PSA]: Secondary | ICD-10-CM | POA: Diagnosis not present

## 2019-07-01 DIAGNOSIS — R7401 Elevation of levels of liver transaminase levels: Secondary | ICD-10-CM | POA: Diagnosis not present

## 2019-07-01 DIAGNOSIS — E7849 Other hyperlipidemia: Secondary | ICD-10-CM | POA: Diagnosis not present

## 2019-07-01 DIAGNOSIS — R82998 Other abnormal findings in urine: Secondary | ICD-10-CM | POA: Diagnosis not present

## 2019-07-01 DIAGNOSIS — R7302 Impaired glucose tolerance (oral): Secondary | ICD-10-CM | POA: Diagnosis not present

## 2019-07-01 DIAGNOSIS — N1831 Chronic kidney disease, stage 3a: Secondary | ICD-10-CM | POA: Diagnosis not present

## 2019-07-01 DIAGNOSIS — E038 Other specified hypothyroidism: Secondary | ICD-10-CM | POA: Diagnosis not present

## 2019-07-04 ENCOUNTER — Other Ambulatory Visit: Payer: Self-pay

## 2019-07-04 MED ORDER — PROPAFENONE HCL ER 325 MG PO CP12: 325.0000 mg | ORAL_CAPSULE | Freq: Two times a day (BID) | ORAL | 2 refills | Status: DC

## 2019-07-15 DIAGNOSIS — G4733 Obstructive sleep apnea (adult) (pediatric): Secondary | ICD-10-CM | POA: Diagnosis not present

## 2019-07-16 ENCOUNTER — Telehealth: Payer: Self-pay | Admitting: Physician Assistant

## 2019-07-16 MED ORDER — EZETIMIBE 10 MG PO TABS: 10.0000 mg | ORAL_TABLET | Freq: Every day | ORAL | 0 refills | Status: DC

## 2019-07-16 NOTE — Telephone Encounter (Signed)
Can you please call patient to schedule OV per [facility] last note 09/2018.   Please advise patient apt needed for further refills. AS, CMA

## 2019-07-18 DIAGNOSIS — N401 Enlarged prostate with lower urinary tract symptoms: Secondary | ICD-10-CM | POA: Diagnosis not present

## 2019-07-18 DIAGNOSIS — R351 Nocturia: Secondary | ICD-10-CM | POA: Diagnosis not present

## 2019-08-18 ENCOUNTER — Emergency Department (HOSPITAL_COMMUNITY)
Admission: EM | Admit: 2019-08-18 | Discharge: 2019-08-19 | Disposition: A | Discharge status: Home / Self Care | Payer: BC Managed Care – PPO | Attending: Emergency Medicine | Admitting: Emergency Medicine

## 2019-08-18 ENCOUNTER — Emergency Department (HOSPITAL_COMMUNITY): Payer: BC Managed Care – PPO

## 2019-08-18 ENCOUNTER — Encounter (HOSPITAL_COMMUNITY): Payer: Self-pay

## 2019-08-18 DIAGNOSIS — R0789 Other chest pain: Secondary | ICD-10-CM

## 2019-08-18 DIAGNOSIS — E039 Hypothyroidism, unspecified: Secondary | ICD-10-CM | POA: Insufficient documentation

## 2019-08-18 DIAGNOSIS — Z79899 Other long term (current) drug therapy: Secondary | ICD-10-CM | POA: Insufficient documentation

## 2019-08-18 DIAGNOSIS — N183 Chronic kidney disease, stage 3 unspecified: Secondary | ICD-10-CM | POA: Diagnosis not present

## 2019-08-18 DIAGNOSIS — I129 Hypertensive chronic kidney disease with stage 1 through stage 4 chronic kidney disease, or unspecified chronic kidney disease: Secondary | ICD-10-CM | POA: Insufficient documentation

## 2019-08-18 DIAGNOSIS — R0602 Shortness of breath: Secondary | ICD-10-CM | POA: Diagnosis not present

## 2019-08-18 DIAGNOSIS — Z7901 Long term (current) use of anticoagulants: Secondary | ICD-10-CM | POA: Insufficient documentation

## 2019-08-18 DIAGNOSIS — R079 Chest pain, unspecified: Secondary | ICD-10-CM | POA: Diagnosis not present

## 2019-08-18 LAB — I-STAT CHEM 8, ED
BUN: 15 mg/dL (ref 6–20)
Calcium, Ion: 1.19 mmol/L (ref 1.15–1.40)
Chloride: 105 mmol/L (ref 98–111)
Creatinine, Ser: 1.4 mg/dL — ABNORMAL HIGH (ref 0.61–1.24)
Glucose, Bld: 97 mg/dL (ref 70–99)
HCT: 47 % (ref 39.0–52.0)
Hemoglobin: 16 g/dL (ref 13.0–17.0)
Potassium: 3.7 mmol/L (ref 3.5–5.1)
Sodium: 142 mmol/L (ref 135–145)
TCO2: 23 mmol/L (ref 22–32)

## 2019-08-18 LAB — CBC
HCT: 48.1 % (ref 39.0–52.0)
Hemoglobin: 15.3 g/dL (ref 13.0–17.0)
MCH: 26.2 pg (ref 26.0–34.0)
MCHC: 31.8 g/dL (ref 30.0–36.0)
MCV: 82.2 fL (ref 80.0–100.0)
Platelets: 240 10*3/uL (ref 150–400)
RBC: 5.85 MIL/uL — ABNORMAL HIGH (ref 4.22–5.81)
RDW: 16.8 % — ABNORMAL HIGH (ref 11.5–15.5)
WBC: 5.5 10*3/uL (ref 4.0–10.5)
nRBC: 0 % (ref 0.0–0.2)

## 2019-08-18 LAB — TROPONIN I (HIGH SENSITIVITY): Troponin I (High Sensitivity): 8 ng/L (ref <18)

## 2019-08-18 LAB — BASIC METABOLIC PANEL
Anion gap: 8 (ref 5–15)
BUN: 13 mg/dL (ref 6–20)
CO2: 21 mmol/L — ABNORMAL LOW (ref 22–32)
Calcium: 9.3 mg/dL (ref 8.9–10.3)
Chloride: 110 mmol/L (ref 98–111)
Creatinine, Ser: 1.38 mg/dL — ABNORMAL HIGH (ref 0.61–1.24)
GFR calc Af Amer: >60 mL/min (ref >60)
GFR calc non Af Amer: 56 mL/min — ABNORMAL LOW (ref >60)
Glucose, Bld: 101 mg/dL — ABNORMAL HIGH (ref 70–99)
Potassium: 3.9 mmol/L (ref 3.5–5.1)
Sodium: 139 mmol/L (ref 135–145)

## 2019-08-18 MED ORDER — IOHEXOL 350 MG/ML SOLN
75.0000 mL | Freq: Once | INTRAVENOUS | Status: AC | PRN
  Administered 2019-08-18: 75 mL via INTRAVENOUS

## 2019-08-18 NOTE — ED Triage Notes (Signed)
Pt reports acute onset SOB, hard time getting deep breath onset this AM. Has been at elevated altitude for the last 8 days, flew back home yesterday. Pt reports hx of PE, DVT, compliant w/ xarelto.

## 2019-08-19 LAB — TROPONIN I (HIGH SENSITIVITY): Troponin I (High Sensitivity): 10 ng/L (ref <18)

## 2019-08-19 NOTE — ED Provider Notes (Signed)
[facility] EMERGENCY DEPARTMENT Provider Note   CSN: [id number] Arrival date & time: 08/18/19  2216     History Chief Complaint  Patient presents with  . Shortness of Breath    Isaiah [name], Isaiah [name] is a 59 y.o. male.  Patient presents to the emergency department for evaluation of chest discomfort.  Patient reports a pressure over the center of his chest that makes him feel like he cannot catch his breath.  Patient just got back from an 8-day vacation out west where he was bike riding.  He just flew back home and is concerned that he might have a PE.  He has had DVT and PE in the past.     HPI: A 59 year old patient with a history of hypertension, hypercholesterolemia and obesity presents for evaluation of chest pain. Initial onset of pain was more than 6 hours ago. The patient's chest pain is described as heaviness/pressure/tightness and is not worse with exertion. The patient's chest pain is not middle- or left-sided, is not well-localized, is not sharp and does not radiate to the arms/jaw/neck. The patient does not complain of nausea and denies diaphoresis. The patient has no history of stroke, has no history of peripheral artery disease, has not smoked in the past 90 days, denies any history of treated diabetes and has no relevant family history of coronary artery disease (first degree relative at less than age 21).   Past Medical History:  Diagnosis Date  . Arthritis   . Blood dyscrasia    lupus anticoagulant  . Diastolic dysfunction    Grade I per echo in 2/12  . DVT (deep venous thrombosis) (HCC) 2000's   "both legs in the calves; left went all the way up to my groin" (09/17/2012)  . Dysrhythmia   . GERD (gastroesophageal reflux disease)   . History of bronchitis   . Hypercoagulation syndrome (HCC)    secondary to circulating lupus anticoagulant  . Hyperlipidemia   . Hypertension   . Hypothyroidism    "wiped out from taking amiodarone" (09/17/2012)  . Lyme  disease    history of   . Obesity (BMI 30-39.9) 07/17/2014  . PAF (paroxysmal atrial fibrillation) (HCC)    no recurrence since Feb 2012  . PONV (postoperative nausea and vomiting)    likes scopolomanine patch  . Pulmonary embolism (HCC) 09/17/2012   "just today; 3 on the right; 2 on the left" (09/17/2012)  . Situational stress   . Sleep apnea    uses CPAP    Patient Active Problem List   Diagnosis Date Noted  . Osteoarthritis of left hip 01/30/2019  . Chronic kidney disease (CKD), stage III (moderate) 10/09/2018  . Verruca pedis 12/11/2017  . Essential hypertension 12/11/2017  . Gastroesophageal reflux disease 12/11/2017  . Pain in right knee 07/28/2017  . Fever 02/20/2017  . Sessile colonic polyp- descending colon 09/28/2016  . Hypercoagulopathy (HCC) 08/05/2016  . Seborrheic dermatitis of scalp 10/01/2015  . Osteoarthritis of multiple joints 08/27/2015  . Chronic anticoagulation- abnormal lupus anticoagulant 08/27/2015  . Personal history of multiple DVTs (deep vein thrombosis) 08/27/2015  . Other reactions to severe stress 08/27/2015  . CPAP (continuous positive airway pressure) dependence 08/27/2015  . GERD (gastroesophageal reflux disease) 08/27/2015  . h/o Lyme disease- 2014 tick bite 08/27/2015  . Memory changes- since Lyme Dx 08/27/2015  . OA (osteoarthritis) of hip 11/26/2014  . Obesity (BMI 30-39.9) 07/17/2014  . OSA (obstructive sleep apnea) 05/02/2014  . Dyslipidemia 05/10/2013  .  History of chronic Myalgias since Lyme Dx 2014 05/10/2013  . Cough 09/28/2012  . h/o Pulmonary embolism and infarction (HCC) 09/17/2012  . Chest pain 01/27/2012  . Hypothyroidism- followed by Dr. Adrian Prince of [facility] 12/17/2010  . HTN (hypertension) 10/01/2010  . PAF (paroxysmal atrial fibrillation) (HCC) 10/01/2010  . High risk medication use 10/01/2010    Past Surgical History:  Procedure Laterality Date  . CARDIOVASCULAR STRESS TEST  07/17/2009   EF 59%  . CARDIOVERSION    .  DISTAL BICEPS TENDON REPAIR Right 2011  . INGUINAL HERNIA REPAIR Right 1992  . INGUINAL HERNIA REPAIR Left 2013  . INSERTION OF MESH N/A 05/14/2015   Procedure: INSERTION OF MESH;  Surgeon: Avel Peace, MD;  Location: [facility];  Service: General;  Laterality: N/A;  . SHOULDER ARTHROSCOPY W/ ROTATOR CUFF REPAIR Left 09/04/2012  . SHOULDER SURGERY     left  . TOTAL HIP ARTHROPLASTY Right 11/26/2014   Procedure: RIGHT TOTAL HIP ARTHROPLASTY ANTERIOR APPROACH;  Surgeon: Ollen Gross, MD;  Location: [facility];  Service: Orthopedics;  Laterality: Right;  . TOTAL HIP ARTHROPLASTY Left 01/30/2019   Procedure: TOTAL HIP ARTHROPLASTY ANTERIOR APPROACH;  Surgeon: Ollen Gross, MD;  Location: [facility];  Service: Orthopedics;  Laterality: Left;   . TRANSTHORACIC ECHOCARDIOGRAM  03/05/2010   EF 60-65%  . UMBILICAL HERNIA REPAIR N/A 05/14/2015   Procedure: HERNIA REPAIR UMBILICAL ADULT WITH MESH ;  Surgeon: Avel Peace, MD;  Location: [facility];  Service: General;  Laterality: N/A;  . WISDOM TOOTH EXTRACTION     lower 2       Family History  Problem Relation Age of Onset  . Hypertension Mother   . Hyperlipidemia Mother   . Cancer Mother        breast  . Hypertension Father   . Cancer Brother        prostate  . Hypertension Brother   . Heart attack Maternal Grandfather     Social History   Tobacco Use  . Smoking status: Never Smoker  . Smokeless tobacco: Never Used  Vaping Use  . Vaping Use: Never used  Substance Use Topics  . Alcohol use: Yes    Alcohol/week: 8.0 standard drinks    Types: 4 Cans of beer, 4 Shots of liquor per week    Comment: 09/17/2012 "1-2 beers and 1-2 tequila on Fri and Sat nights"  . Drug use: No    Home Medications Prior to Admission medications   Medication Sig Start Date End Date Taking? Authorizing Provider  acetaminophen (TYLENOL) 500 MG tablet Take 1,000 mg by mouth every 6 (six) hours as needed (for pain.).    [provider]  ALPRAZolam  Prudy Feeler) 0.25 MG tablet Take 1 tablet by mouth 2 (two) times daily.  01/25/17   [provider]  amLODipine (NORVASC) 5 MG tablet TAKE 1 TABLET BY MOUTH DAILY 12/11/18   Opalski, Gavin Pound, DO  ezetimibe (ZETIA) 10 MG tablet Take 1 tablet (10 mg total) by mouth daily. **PATIENT NEEDS APT FOR FURTHER REFILLS** 07/16/19   Mayer Masker, PA-C  fluocinonide (LIDEX) 0.05 % external solution Apply 1 application topically daily as needed (scalp irritation.).  10/25/18   [provider]  HYDROcodone-acetaminophen (NORCO/VICODIN) 5-325 MG tablet Take 1-2 tablets by mouth every 6 (six) hours as needed for moderate pain (pain score 4-6). 01/31/19   Constable, Amber, PA-C  lisinopril (ZESTRIL) 20 MG tablet TAKE 1 TABLET BY MOUTH TWICE DAILY 01/28/19   [name], DO  methocarbamol (ROBAXIN) 500 MG tablet Take 1 tablet (500 mg total) by mouth every 6 (six) hours as needed for muscle spasms. 01/31/19   Celedonio Savage, Amber, PA-C  metoprolol succinate (TOPROL-XL) 25 MG 24 hr tablet TAKE 1 TABLET BY MOUTH EACH MORNING 03/11/19   [name], Deloris Ping, MD  omeprazole (PRILOSEC) 20 MG capsule Take 1 capsule (20 mg total) by mouth daily. 03/18/14   Azalia Bilis, MD  propafenone (RYTHMOL SR) 325 MG 12 hr capsule Take 1 capsule (325 mg total) by mouth 2 (two) times daily. 07/04/19   [name], Deloris Ping, MD  REPATHA SURECLICK 140 MG/ML SOAJ INJECT 1 PEN INTO THE SKIN EVERY 14 DAYS 10/16/18   [name], Deloris Ping, MD  rivaroxaban (XARELTO) 20 MG TABS tablet Take 1 tablet (20 mg total) by mouth daily with supper. 04/15/19   [name], Deloris Ping, MD  SYNTHROID 150 MCG tablet Take 300 mcg by mouth daily.  07/07/14   [provider]  tamsulosin (FLOMAX) 0.4 MG CAPS capsule Take 0.4 mg by mouth every evening.  01/21/19   [provider]  traMADol (ULTRAM) 50 MG tablet Take 1-2 tablets (50-100 mg total) by mouth every 6 (six) hours as needed for moderate pain. 01/31/19   Constable, Amber, PA-C  lisinopril (ZESTRIL) 20 MG  tablet Take 1 tablet (20 mg total) by mouth 2 (two) times daily. Needs office visit 08/30/18   [name], DO    Allergies    Crestor [rosuvastatin calcium] and Lipitor [atorvastatin calcium]  Review of Systems   Review of Systems  Respiratory: Positive for chest tightness and shortness of breath.   All other systems reviewed and are negative.   Physical Exam Updated Vital Signs BP 127/84 (BP Location: Left Arm)   Pulse 71   Temp 98.1 F (36.7 C) (Oral)   Resp 16   Ht 6\' 3"  (1.905 m)   Wt (!) 122 kg   SpO2 99%   BMI 33.62 kg/m   Physical Exam Vitals and nursing note reviewed.  Constitutional:      General: He is not in acute distress.    Appearance: Normal appearance. He is well-developed.  HENT:     Head: Normocephalic and atraumatic.     Right Ear: Hearing normal.     Left Ear: Hearing normal.     Nose: Nose normal.  Eyes:     Conjunctiva/sclera: Conjunctivae normal.     Pupils: Pupils are equal, round, and reactive to light.  Cardiovascular:     Rate and Rhythm: Regular rhythm.     Heart sounds: S1 normal and S2 normal. No murmur heard.  No friction rub. No gallop.   Pulmonary:     Effort: Pulmonary effort is normal. No respiratory distress.     Breath sounds: Normal breath sounds.  Chest:     Chest wall: No tenderness.  Abdominal:     General: Bowel sounds are normal.     Palpations: Abdomen is soft.     Tenderness: There is no abdominal tenderness. There is no guarding or rebound. Negative signs include Murphy's sign and McBurney's sign.     Hernia: No hernia is present.  Musculoskeletal:        General: Normal range of motion.     Cervical back: Normal range of motion and neck supple.  Skin:    General: Skin is warm and dry.     Findings: No rash.  Neurological:     Mental Status: He is alert and oriented to person,  place, and time.     GCS: GCS eye subscore is 4. GCS verbal subscore is 5. GCS motor subscore is 6.     Cranial Nerves: No cranial  nerve deficit.     Sensory: No sensory deficit.     Coordination: Coordination normal.  Psychiatric:        Speech: Speech normal.        Behavior: Behavior normal.        Thought Content: Thought content normal.     ED Results / Procedures / Treatments   Labs (all labs ordered are listed, but only abnormal results are displayed) Labs Reviewed  BASIC METABOLIC PANEL - Abnormal; Notable for the following components:      Result Value   CO2 21 (*)    Glucose, Bld 101 (*)    Creatinine, Ser 1.38 (*)    GFR calc non Af Amer 56 (*)    All other components within normal limits  CBC - Abnormal; Notable for the following components:   RBC 5.85 (*)    RDW 16.8 (*)    All other components within normal limits  I-STAT CHEM 8, ED - Abnormal; Notable for the following components:   Creatinine, Ser 1.40 (*)    All other components within normal limits  TROPONIN I (HIGH SENSITIVITY)  TROPONIN I (HIGH SENSITIVITY)    EKG EKG Interpretation  Date/Time:  Sunday August 18 2019 22:34:52 EDT Ventricular Rate:  74 PR Interval:  174 QRS Duration: 102 QT Interval:  366 QTC Calculation: 406 R Axis:   56 Text Interpretation: Normal sinus rhythm Normal ECG Confirmed by Gilda Crease [phone]) on 08/19/2019 4:05:03 AM   Radiology DG Chest 2 View  Result Date: 08/18/2019 CLINICAL DATA:  Chest pain EXAM: CHEST - 2 VIEW COMPARISON:  July 29, 2016 FINDINGS: The heart size and mediastinal contours are within normal limits. Both lungs are clear. Degenerative changes seen in the midthoracic spine. IMPRESSION: No active cardiopulmonary disease. Electronically Signed   By: Jonna Clark M.D.   On: 08/18/2019 23:26   CT Angio Chest PE W and/or Wo Contrast  Result Date: 08/19/2019 CLINICAL DATA:  Shortness of breath history of pulmonary embolism EXAM: CT ANGIOGRAPHY CHEST WITH CONTRAST TECHNIQUE: Multidetector CT imaging of the chest was performed using the standard protocol during bolus  administration of intravenous contrast. Multiplanar CT image reconstructions and MIPs were obtained to evaluate the vascular anatomy. CONTRAST:  67mL OMNIPAQUE IOHEXOL 350 MG/ML SOLN COMPARISON:  September 17, 2012 FINDINGS: Cardiovascular: There is a optimal opacification of the pulmonary arteries. There is no central,segmental, or subsegmental filling defects within the pulmonary arteries. The heart is normal in size. No pericardial effusion or thickening. No evidence right heart strain. There is normal three-vessel brachiocephalic anatomy without proximal stenosis. Minimal coronary artery calcifications are seen. There is scattered aortic atherosclerosis. Mediastinum/Nodes: No hilar, mediastinal, or axillary adenopathy. Thyroid gland, trachea, and esophagus demonstrate no significant findings. Lungs/Pleura: The lungs are clear. No pleural effusion or pneumothorax. No airspace consolidation. Upper Abdomen: No acute abnormalities present in the visualized portions of the upper abdomen. Musculoskeletal: No chest wall abnormality. No acute or significant osseous findings. Degenerative changes are seen in midthoracic spine. Review of the MIP images confirms the above findings. IMPRESSION: No central, segmental, or subsegmental pulmonary embolism. No acute intrathoracic pathology to explain the patient's symptoms. Aortic Atherosclerosis (ICD10-I70.0). Electronically Signed   By: Jonna Clark M.D.   On: 08/19/2019 00:25    Procedures Procedures (including critical care  time)  Medications Ordered in ED Medications  iohexol (OMNIPAQUE) 350 MG/ML injection 75 mL (75 mLs Intravenous Contrast Given 08/18/19 2345)    ED Course  I have reviewed the triage vital signs and the nursing notes.  Pertinent labs & imaging results that were available during my care of the patient were reviewed by me and considered in my medical decision making (see chart for details).    MDM Rules/Calculators/A&P HEAR Score: 3                         Patient presents to the emergency department for evaluation of chest discomfort.  He feels short of breath and like he cannot catch his breath.  Patient is concerned because of his history of PE and recent travel.  Work-up has been reassuring.  EKG unremarkable, troponin negative x2.  CT angiography does not show any evidence of PE or other abnormality.  Patient has been taking Diamox for the week because he has had a history of altitude sickness and was at altitude for his vacation.  This may factor in.  Reviewing his records reveals he has had stress tests in the past that were negative.  He does have some cardiac risk factors but his heart score is 3 and he is therefore appropriate for outpatient follow-up if symptoms continue.  Patient reassured, increase hydration at home.  Final Clinical Impression(s) / ED Diagnoses Final diagnoses:  Chest pressure    Rx / DC Orders ED Discharge Orders    None       Jackson Fetters, Canary Brimhristopher J, MD 08/19/19 [phone]

## 2019-08-25 ENCOUNTER — Encounter: Payer: Self-pay | Admitting: Emergency Medicine

## 2019-08-25 ENCOUNTER — Ambulatory Visit: Payer: Self-pay

## 2019-08-25 ENCOUNTER — Other Ambulatory Visit: Payer: Self-pay

## 2019-08-25 ENCOUNTER — Ambulatory Visit
Admission: EM | Admit: 2019-08-25 | Discharge: 2019-08-25 | Disposition: A | Discharge status: Home / Self Care | Payer: BC Managed Care – PPO | Attending: Physician Assistant | Admitting: Physician Assistant

## 2019-08-25 DIAGNOSIS — Z1152 Encounter for screening for COVID-19: Secondary | ICD-10-CM | POA: Diagnosis not present

## 2019-08-25 DIAGNOSIS — R509 Fever, unspecified: Secondary | ICD-10-CM | POA: Diagnosis not present

## 2019-08-25 DIAGNOSIS — R0981 Nasal congestion: Secondary | ICD-10-CM

## 2019-08-25 NOTE — ED Provider Notes (Signed)
[facility]    CSN: [id number] Arrival date & time: 08/25/19  1201      History   Chief Complaint Chief Complaint  Patient presents with  . Cough  . 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   59 year old male comes in for 5-day history of shortness of breath, fatigue, cough.  He was seen at the ED 08/18/2019, at that time, complained of chest pressure with shortness of breath. CT angio was negative.  Heart score of 3, was discharged for outpatient follow-up. Since then, shob has improved. However, having decreased appetite, fatigue, nonproductive cough. tmax 100.9. never smoker. No COVID ordered at ED visit     Past Medical History:  Diagnosis Date  . Arthritis   . Blood dyscrasia    lupus anticoagulant  . Diastolic dysfunction    Grade I per echo in 2/12  . DVT (deep venous thrombosis) (HCC) 2000's   "both legs in the calves; left went all the way up to my groin" (09/17/2012)  . Dysrhythmia   . GERD (gastroesophageal reflux disease)   . History of bronchitis   . Hypercoagulation syndrome (HCC)    secondary to circulating lupus anticoagulant  . Hyperlipidemia   . Hypertension   . Hypothyroidism    "wiped out from taking amiodarone" (09/17/2012)  . Lyme disease    history of   . Obesity (BMI 30-39.9) 07/17/2014  . PAF (paroxysmal atrial fibrillation) (HCC)    no recurrence since Feb 2012  . PONV (postoperative nausea and vomiting)    likes scopolomanine patch  . Pulmonary embolism (HCC) 09/17/2012   "just today; 3 on the right; 2 on the left" (09/17/2012)  . Situational stress   . Sleep apnea    uses CPAP    Patient Active Problem List   Diagnosis Date Noted  . Osteoarthritis of left hip 01/30/2019  . Chronic kidney disease (CKD), stage III (moderate) 10/09/2018  . Verruca pedis 12/11/2017  . Essential hypertension 12/11/2017  . Gastroesophageal reflux disease 12/11/2017  . Pain in right knee 07/28/2017  . Fever 02/20/2017  . Sessile  colonic polyp- descending colon 09/28/2016  . Hypercoagulopathy (HCC) 08/05/2016  . Seborrheic dermatitis of scalp 10/01/2015  . Osteoarthritis of multiple joints 08/27/2015  . Chronic anticoagulation- abnormal lupus anticoagulant 08/27/2015  . Personal history of multiple DVTs (deep vein thrombosis) 08/27/2015  . Other reactions to severe stress 08/27/2015  . CPAP (continuous positive airway pressure) dependence 08/27/2015  . GERD (gastroesophageal reflux disease) 08/27/2015  . h/o Lyme disease- 2014 tick bite 08/27/2015  . Memory changes- since Lyme Dx 08/27/2015  . OA (osteoarthritis) of hip 11/26/2014  . Obesity (BMI 30-39.9) 07/17/2014  . OSA (obstructive sleep apnea) 05/02/2014  . Dyslipidemia 05/10/2013  . History of chronic Myalgias since Lyme Dx 2014 05/10/2013  . Cough 09/28/2012  . h/o Pulmonary embolism and infarction (HCC) 09/17/2012  . Chest pain 01/27/2012  . Hypothyroidism- [facility] 12/17/2010  . HTN (hypertension) 10/01/2010  . PAF (paroxysmal atrial fibrillation) (HCC) 10/01/2010  . High risk medication use 10/01/2010    Past Surgical History:  Procedure Laterality Date  . CARDIOVASCULAR STRESS TEST  07/17/2009   EF 59%  . CARDIOVERSION    . DISTAL BICEPS TENDON REPAIR Right 2011  . INGUINAL HERNIA REPAIR Right 1992  . INGUINAL HERNIA REPAIR Left 2013  . INSERTION OF MESH N/A 05/14/2015   Procedure: INSERTION OF MESH;  Surgeon: Tawanna Coolerodd  Rosenbower, MD;  Location: [facility];  Service: General;  Laterality: N/A;  . SHOULDER ARTHROSCOPY W/ ROTATOR CUFF REPAIR Left 09/04/2012  . SHOULDER SURGERY     left  . TOTAL HIP ARTHROPLASTY Right 11/26/2014   Procedure: RIGHT TOTAL HIP ARTHROPLASTY ANTERIOR APPROACH;  Surgeon: Ollen Gross, MD;  Location: [facility];  Service: Orthopedics;  Laterality: Right;  . TOTAL HIP ARTHROPLASTY Left 01/30/2019   Procedure: TOTAL HIP ARTHROPLASTY ANTERIOR APPROACH;  Surgeon: Ollen Gross, MD;  Location: [facility];   Service: Orthopedics;  Laterality: Left;   . TRANSTHORACIC ECHOCARDIOGRAM  03/05/2010   EF 60-65%  . UMBILICAL HERNIA REPAIR N/A 05/14/2015   Procedure: HERNIA REPAIR UMBILICAL ADULT WITH MESH ;  Surgeon: Avel Peace, MD;  Location: [facility];  Service: General;  Laterality: N/A;  . WISDOM TOOTH EXTRACTION     lower 2       Home Medications    Prior to Admission medications   Medication Sig Start Date End Date Taking? Authorizing Provider  acetaminophen (TYLENOL) 500 MG tablet Take 1,000 mg by mouth every 6 (six) hours as needed (for pain.).    [provider]  ALPRAZolam Prudy Feeler) 0.25 MG tablet Take 1 tablet by mouth 2 (two) times daily.  01/25/17   [provider]  amLODipine (NORVASC) 5 MG tablet TAKE 1 TABLET BY MOUTH DAILY 12/11/18   Opalski, Gavin Pound, DO  ezetimibe (ZETIA) 10 MG tablet Take 1 tablet (10 mg total) by mouth daily. **PATIENT NEEDS APT FOR FURTHER REFILLS** 07/16/19   Mayer Masker, PA-C  fluocinonide (LIDEX) 0.05 % external solution Apply 1 application topically daily as needed (scalp irritation.).  10/25/18   [provider]  HYDROcodone-acetaminophen (NORCO/VICODIN) 5-325 MG tablet Take 1-2 tablets by mouth every 6 (six) hours as needed for moderate pain (pain score 4-6). 01/31/19   Constable, Amber, PA-C  lisinopril (ZESTRIL) 20 MG tablet TAKE 1 TABLET BY MOUTH TWICE DAILY 01/28/19   Opalski, Gavin Pound, DO  methocarbamol (ROBAXIN) 500 MG tablet Take 1 tablet (500 mg total) by mouth every 6 (six) hours as needed for muscle spasms. 01/31/19   Celedonio Savage, Amber, PA-C  metoprolol succinate (TOPROL-XL) 25 MG 24 hr tablet TAKE 1 TABLET BY MOUTH EACH MORNING 03/11/19   [name], Deloris Ping, MD  omeprazole (PRILOSEC) 20 MG capsule Take 1 capsule (20 mg total) by mouth daily. 03/18/14   Azalia Bilis, MD  propafenone (RYTHMOL SR) 325 MG 12 hr capsule Take 1 capsule (325 mg total) by mouth 2 (two) times daily. 07/04/19   [name], Deloris Ping, MD  REPATHA SURECLICK  140 MG/ML SOAJ INJECT 1 PEN INTO THE SKIN EVERY 14 DAYS 10/16/18   [name], Deloris Ping, MD  rivaroxaban (XARELTO) 20 MG TABS tablet Take 1 tablet (20 mg total) by mouth daily with supper. 04/15/19   [name], Deloris Ping, MD  SYNTHROID 150 MCG tablet Take 300 mcg by mouth daily.  07/07/14   [provider]  tamsulosin (FLOMAX) 0.4 MG CAPS capsule Take 0.4 mg by mouth every evening.  01/21/19   [provider]  traMADol (ULTRAM) 50 MG tablet Take 1-2 tablets (50-100 mg total) by mouth every 6 (six) hours as needed for moderate pain. 01/31/19   Constable, Amber, PA-C  lisinopril (ZESTRIL) 20 MG tablet Take 1 tablet (20 mg total) by mouth 2 (two) times daily. Needs office visit 08/30/18   [name], DO    Family History Family History  Problem Relation Age of Onset  . Hypertension Mother   .  Hyperlipidemia Mother   . Cancer Mother        breast  . Hypertension Father   . Cancer Brother        prostate  . Hypertension Brother   . Heart attack Maternal Grandfather     Social History Social History   Tobacco Use  . Smoking status: Never Smoker  . Smokeless tobacco: Never Used  Vaping Use  . Vaping Use: Never used  Substance Use Topics  . Alcohol use: Yes    Alcohol/week: 8.0 standard drinks    Types: 4 Cans of beer, 4 Shots of liquor per week    Comment: 09/17/2012 "1-2 beers and 1-2 tequila on Fri and Sat nights"  . Drug use: No     Allergies   Crestor [rosuvastatin calcium] and Lipitor [atorvastatin calcium]   Review of Systems Review of Systems  Reason unable to perform ROS: See HPI as above.     Physical Exam Triage Vital Signs ED Triage Vitals  Enc Vitals Group     BP 08/25/19 1211 129/81     Pulse Rate 08/25/19 1211 89     Resp 08/25/19 1211 18     Temp 08/25/19 1211 98.5 F (36.9 C)     Temp Source 08/25/19 1211 Oral     SpO2 08/25/19 1211 96 %     Weight --      Height --      Head Circumference --      Peak Flow --      Pain Score 08/25/19  1219 5     Pain Loc --      Pain Edu? --      Excl. in GC? --    No data found.  Updated Vital Signs BP 129/81 (BP Location: Left Arm)   Pulse 89   Temp 98.5 F (36.9 C) (Oral)   Resp 18   SpO2 96%   Physical Exam Constitutional:      General: He is not in acute distress.    Appearance: Normal appearance. He is not ill-appearing, toxic-appearing or diaphoretic.  HENT:     Head: Normocephalic and atraumatic.     Mouth/Throat:     Mouth: Mucous membranes are moist.     Pharynx: Oropharynx is clear. Uvula midline.  Cardiovascular:     Rate and Rhythm: Normal rate and regular rhythm.     Heart sounds: Normal heart sounds. No murmur heard.  No friction rub. No gallop.   Pulmonary:     Effort: Pulmonary effort is normal. No accessory muscle usage, prolonged expiration, respiratory distress or retractions.     Comments: Lungs clear to auscultation without adventitious lung sounds. Musculoskeletal:     Cervical back: Normal range of motion and neck supple.  Skin:    General: Skin is warm and dry.  Neurological:     General: No focal deficit present.     Mental Status: He is alert and oriented to person, place, and time.      UC Treatments / Results  Labs (all labs ordered are listed, but only abnormal results are displayed) Labs Reviewed  NOVEL CORONAVIRUS, NAA    EKG   Radiology No results found.  Procedures Procedures (including critical care time)  Medications Ordered in UC Medications - No data to display  Initial Impression / Assessment and Plan / UC Course  I have reviewed the triage vital signs and the nursing notes.  Pertinent labs & imaging results that were available during my  care of the patient were reviewed by me and considered in my medical decision making (see chart for details).    Patient currently afebrile, nontoxic. Stable vitals. LCTAB. COVID testing ordered. Symptomatic treatment discussed. Return precautions given.  Final Clinical  Impressions(s) / UC Diagnoses   Final diagnoses:  Fever, unspecified  Nasal congestion    ED Prescriptions    None     PDMP not reviewed this encounter.   Belinda Fisher, PA-C 08/25/19 1437

## 2019-08-25 NOTE — Discharge Instructions (Signed)
COVID PCR testing ordered. I would like you to quarantine until testing results. You can take over the counter flonase/nasacort to help with nasal congestion/drainage. Tylenol/motrin for pain and fever. Keep hydrated, urine should be clear to pale yellow in color. If experiencing shortness of breath, trouble breathing, go to the emergency department for further evaluation needed.  

## 2019-08-25 NOTE — ED Triage Notes (Signed)
Pt sts cough with fatigue and SOB x 5 days; pt sts nasal congestion currently; pt sts fever started today; pt was seen [facility] for [facility] which didn't show any

## 2019-08-26 LAB — NOVEL CORONAVIRUS, NAA: SARS-CoV-2, NAA: DETECTED — AB

## 2019-08-26 LAB — SARS-COV-2, NAA 2 DAY TAT

## 2019-08-27 ENCOUNTER — Other Ambulatory Visit (HOSPITAL_COMMUNITY): Payer: Self-pay | Admitting: Oncology

## 2019-08-27 ENCOUNTER — Ambulatory Visit (HOSPITAL_COMMUNITY)
Admission: RE | Admit: 2019-08-27 | Discharge: 2019-08-27 | Disposition: A | Discharge status: Home / Self Care | Payer: BC Managed Care – PPO | Source: Ambulatory Visit | Attending: Pulmonary Disease | Admitting: Pulmonary Disease

## 2019-08-27 ENCOUNTER — Telehealth (HOSPITAL_COMMUNITY): Payer: Self-pay | Admitting: Oncology

## 2019-08-27 DIAGNOSIS — U071 COVID-19: Secondary | ICD-10-CM

## 2019-08-27 MED ORDER — ALBUTEROL SULFATE HFA 108 (90 BASE) MCG/ACT IN AERS: 2.0000 | INHALATION_SPRAY | Freq: Once | RESPIRATORY_TRACT | Status: DC | PRN

## 2019-08-27 MED ORDER — SODIUM CHLORIDE 0.9 % IV SOLN: INTRAVENOUS | Status: DC | PRN

## 2019-08-27 MED ORDER — EPINEPHRINE 0.3 MG/0.3ML IJ SOAJ: 0.3000 mg | Freq: Once | INTRAMUSCULAR | Status: DC | PRN

## 2019-08-27 MED ORDER — FAMOTIDINE IN NACL 20-0.9 MG/50ML-% IV SOLN: 20.0000 mg | Freq: Once | INTRAVENOUS | Status: DC | PRN

## 2019-08-27 MED ORDER — SODIUM CHLORIDE 0.9 % IV SOLN
1200.0000 mg | Freq: Once | INTRAVENOUS | Status: AC
  Administered 2019-08-27: 1200 mg via INTRAVENOUS
  Filled 2019-08-27: qty 600
  Filled 2019-08-27: qty 10

## 2019-08-27 MED ORDER — METHYLPREDNISOLONE SODIUM SUCC 125 MG IJ SOLR: 125.0000 mg | Freq: Once | INTRAMUSCULAR | Status: DC | PRN

## 2019-08-27 MED ORDER — DIPHENHYDRAMINE HCL 50 MG/ML IJ SOLN: 50.0000 mg | Freq: Once | INTRAMUSCULAR | Status: DC | PRN

## 2019-08-27 NOTE — Discharge Instructions (Signed)

## 2019-08-27 NOTE — Progress Notes (Signed)
Error

## 2019-08-27 NOTE — Progress Notes (Signed)
  Diagnosis: COVID-19  Physician: Dr. Patrick Wright  Procedure: [facility] Med: casirivimab\imdevimab infusion - Provided patient with casirivimab\imdevimab fact sheet for patients, parents and caregivers prior to infusion.  Complications: No immediate complications noted.  Discharge: Discharged home   Isaiah [name] S Kairon Shock 08/27/2019  

## 2019-08-27 NOTE — Telephone Encounter (Signed)
I connected by phone with  Mr. Vastine on 08/27/2019 at 1pm to discuss the potential use of an new treatment for mild to moderate COVID-19 viral infection in non-hospitalized patients.   This patient is a age/sex that meets the FDA criteria for Emergency Use Authorization of casirivimab\imdevimab.  Has a (+) direct SARS-CoV-2 viral test result 1. Has mild or moderate COVID-19  2. Is ? 57 years of age and weighs ? 40 kg 3. Is NOT hospitalized due to COVID-19 4. Is NOT requiring oxygen therapy or requiring an increase in baseline oxygen flow rate due to COVID-19 5. Is within 10 days of symptom onset 6. Has at least one of the high risk factor(s) for progression to severe COVID-19 and/or hospitalization as defined in EUA. ? Specific high risk criteria : CAD   Symptom onset:08/20/2019   I have spoken and communicated the following to the patient or parent/caregiver:   1. FDA has authorized the emergency use of casirivimab\imdevimab for the treatment of mild to moderate COVID-19 in adults and pediatric patients with positive results of direct SARS-CoV-2 viral testing who are 87 years of age and older weighing at least 40 kg, and who are at high risk for progressing to severe COVID-19 and/or hospitalization.   2. The significant known and potential risks and benefits of casirivimab\imdevimab, and the extent to which such potential risks and benefits are unknown.   3. Information on available alternative treatments and the risks and benefits of those alternatives, including clinical trials.   4. Patients treated with casirivimab\imdevimab should continue to self-isolate and use infection control measures (e.g., wear mask, isolate, social distance, avoid sharing personal items, clean and disinfect "high touch" surfaces, and frequent handwashing) according to CDC guidelines.    5. The patient or parent/caregiver has the option to accept or refuse casirivimab\imdevimab .   After reviewing this  information with the patient, The patient agreed to proceed with receiving casirivimab\imdevimab infusion and will be provided a copy of the Fact sheet prior to receiving the infusion.Mignon Pine, AGNP-C [phone] (Infusion Center Hotline)

## 2019-09-03 ENCOUNTER — Other Ambulatory Visit: Payer: Self-pay

## 2019-09-03 ENCOUNTER — Telehealth: Payer: BC Managed Care – PPO | Admitting: Medical-Surgical

## 2019-09-03 DIAGNOSIS — R05 Cough: Secondary | ICD-10-CM | POA: Diagnosis not present

## 2019-09-03 DIAGNOSIS — U071 COVID-19: Secondary | ICD-10-CM | POA: Diagnosis not present

## 2019-09-17 ENCOUNTER — Telehealth: Payer: Self-pay | Admitting: Physician Assistant

## 2019-09-17 MED ORDER — EZETIMIBE 10 MG PO TABS: 10.0000 mg | ORAL_TABLET | Freq: Every day | ORAL | 0 refills | Status: DC

## 2019-09-17 NOTE — Telephone Encounter (Signed)
Please call patient to schedule apt for further refills. AS< CMA 

## 2019-09-27 ENCOUNTER — Other Ambulatory Visit: Payer: Self-pay | Admitting: Pharmacist

## 2019-09-27 MED ORDER — REPATHA SURECLICK 140 MG/ML ~~LOC~~ SOAJ: SUBCUTANEOUS | 11 refills | Status: DC

## 2019-10-01 ENCOUNTER — Ambulatory Visit (INDEPENDENT_AMBULATORY_CARE_PROVIDER_SITE_OTHER): Payer: BC Managed Care – PPO | Admitting: Cardiovascular Disease

## 2019-10-01 ENCOUNTER — Ambulatory Visit: Payer: BC Managed Care – PPO | Admitting: Cardiovascular Disease

## 2019-10-01 ENCOUNTER — Encounter: Payer: Self-pay | Admitting: Cardiovascular Disease

## 2019-10-01 ENCOUNTER — Other Ambulatory Visit: Payer: Self-pay

## 2019-10-01 VITALS — BP 124/82 | HR 78 | Ht 75.5 in | Wt 263.8 lb

## 2019-10-01 DIAGNOSIS — I10 Essential (primary) hypertension: Secondary | ICD-10-CM

## 2019-10-01 DIAGNOSIS — I2699 Other pulmonary embolism without acute cor pulmonale: Secondary | ICD-10-CM | POA: Diagnosis not present

## 2019-10-01 DIAGNOSIS — E782 Mixed hyperlipidemia: Secondary | ICD-10-CM

## 2019-10-01 DIAGNOSIS — R0789 Other chest pain: Secondary | ICD-10-CM | POA: Diagnosis not present

## 2019-10-01 DIAGNOSIS — I480 Paroxysmal atrial fibrillation: Secondary | ICD-10-CM | POA: Diagnosis not present

## 2019-10-01 DIAGNOSIS — G933 Postviral fatigue syndrome: Secondary | ICD-10-CM

## 2019-10-01 DIAGNOSIS — G9331 Postviral fatigue syndrome: Secondary | ICD-10-CM

## 2019-10-01 NOTE — Progress Notes (Signed)
CARDIOLOGY OFFICE NOTE  Date:  10/01/2019    Isaiah [name], Isaiah [name] Date of Birth: Jun 07, 1960 Medical Record #[id number]  PCP:  Adrian Prince, MD  Cardiologist:  Former patient of Dr. Yevonne Pax.   Now Derrick Tiegs  Chief Complaint  Patient presents with  . Atrial Fibrillation  . Hyperlipidemia   Problem list 1. History of recurrent pulmonary embolus 2. Hypercoagulability - Lupus anticoagulant  3. Hypertension 4. Osteoarthritis-status post hip replacements 5.  Paroxysmal atrial fibrillation 6. Lyme disease - 2014    Isaiah [name], Isaiah [name] is a 59 y.o. male who presents today for a 4 month check. Former patient of Dr. Yevonne Pax.   He has a history of prior PE and found to have a hypercoagulability state with abnormal lupus anticoagulant - he is committed to lifelong anticoagulation and is on Xarelto. He has had frequent DVT, PAF, hypothyroidism, HLD and HTN.  He has sleep apnea and is on CPAP - followed by Dr. Mayford Knife.   In January 2014 the patient experienced some chest tightness and underwent a treadmill Myoview stress test which showed no evidence of ischemia and his ejection fraction was 53% with no wall motion abnormalities.   Last seen in November. Cardiac status was stable.   Comes back today. Here alone. He is doing well. He had his hip replacement back in November - did well. Back on track with losing weight - says he is down 20 pounds. BP is lower. No chest pain. Breathing is good. Rhythm is good. Asking about reduction of medicines. His significant other has been diagnosed with breast cancer and he has been helping with her.    June 19, 2015:  Isaiah [name] is seen today for initial visit.   He is a former patient of [facility]. Has hx of PAF - none in the past 6-8 years.    Seems to be triggered by large doses of caffiene.  Has cardioverted on 1 occasion but usually he will convert back into NSR after a day or so .   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   No weight lifting due to a recent hernia repair .   Dec. 1, 2017:  Isaiah [name] is seen today in follow-up for his paroxysmal atrial fib ablation, diastolic dysfunction, and hypertension. No palps  June 21, 2016: Still very active.  Used to lift heavy weight and fight MMA . Does cardio regularly. Does interval  , ellipitical  Dental practice is going well.    No episodes of atrial fib - none in the past 6-8 years  Has hx of DVT ,  He walks frequently when flying   August 04, 2016:  Isaiah [name] is seen today for work in visit. He started having episodes of chest pain last week.  He went on vacation a week ago Ate too much salt, Came home ,  Took Lasix for swelling  Had a non productive cough.   Chest tightness.   Pressure like sensation No pleuretic CP,  Difficult to take a deep breath No fever , dry cough.  Felt a bit better after Z-pac.  But chest pressure persisted.  Feels different from his pulmonary embolus pain   Echo in 2014 showed normal LV function   Sept. 12, 2018:  Isaiah [name] is seen back after having an episode of chest pain  Coronary CT angio shows a prox LAD stenosis of 30%.  Calcium score of 44 (  72 percentile for age )  On Zetia,  Had problems with crestor ( elevated liver enzymes)  and Lipitor ( profound muscle aches )  .   Feeling better.  Still working out . No CP   May 19, 2017:   Doing well. Went on [employer] and lost 20 lbs.   Feeling well .  Has not had any recurrent PAF  - last episode was 10 years ago   November 17, 2017:  Isaiah [name] is seen today for follow-up of his paroxysmal atrial fibrillation.  He also has a hypercoagulable state and is on Xarelto. Is a history of very mild coronary artery disease.  Coronary CT angiogram showed a proximal LAD stenosis of approximately 30%.  Coronary calcium score is 44.  He is on Zetia and Repatha. Very busy at work .   Has not been working out as much .  Has OSA - wears his CPAP ,   Seems to be working well   March 29, 2019:  Isaiah [name] is seen today for follow-up of his paroxysmal atrial fibrillation and hypercoagulable state.  He remains on Xarelto.  He has very mild coronary artery disease.  Coronary CT angiogram showed a proximal stenosis of approximately 30%.  His coronary calcium score is 44.  Remains on Zetia and Repatha. Had a hip replacement this past year  No covid   Sept. 14, 2021:  Isaiah [name] is seen today for paroxysmal atrial fibrillation.  He has a hypercoagulable state and has not had a pulmonary embolus in the past.  He is on Xarelto.  CT angiogram reveals a proximal LAD stenosis of approximately 30%.  His coronary calcium score is 44.  He remains on Zetia and Repatha.  Is getting over covid pneumonia  Got back from a motorcycle trip up in [place].  Became very short of breath CT angio rules out PE,    Worked all week.  Spiked a temp a week later.   Thinks he may have caught covid in the ER  + night sweats ,  Went to [employer] . Took omnicef, albuterol , monoclonal antibodies Is better now  Still thinks he feels something in his chest  O2 sats are normal .  No pleuretic CP,  Staying hydrated.   Past Medical History:  Diagnosis Date  . Arthritis   . Blood dyscrasia    lupus anticoagulant  . Diastolic dysfunction    Grade I per echo in 2/12  . DVT (deep venous thrombosis) (HCC) 2000's   "both legs in the calves; left went all the way up to my groin" (09/17/2012)  . Dysrhythmia   . GERD (gastroesophageal reflux disease)   . History of bronchitis   . Hypercoagulation syndrome (HCC)    secondary to circulating lupus anticoagulant  . Hyperlipidemia   . Hypertension   . Hypothyroidism    "wiped out from taking amiodarone" (09/17/2012)  . Lyme disease    history of   . Obesity (BMI 30-39.9) 07/17/2014  . PAF (paroxysmal atrial fibrillation) (HCC)    no recurrence since Feb 2012  . PONV (postoperative nausea and vomiting)    likes  scopolomanine patch  . Pulmonary embolism (HCC) 09/17/2012   "just today; 3 on the right; 2 on the left" (09/17/2012)  . Situational stress   . Sleep apnea    uses CPAP    Past Surgical History:  Procedure Laterality Date  . CARDIOVASCULAR STRESS TEST  07/17/2009   EF 59%  . CARDIOVERSION    .  DISTAL BICEPS TENDON REPAIR Right 2011  . INGUINAL HERNIA REPAIR Right 1992  . INGUINAL HERNIA REPAIR Left 2013  . INSERTION OF MESH N/A 05/14/2015   Procedure: INSERTION OF MESH;  Surgeon: Avel Peaceodd Rosenbower, MD;  Location: [facility];  Service: General;  Laterality: N/A;  . SHOULDER ARTHROSCOPY W/ ROTATOR CUFF REPAIR Left 09/04/2012  . SHOULDER SURGERY     left  . TOTAL HIP ARTHROPLASTY Right 11/26/2014   Procedure: RIGHT TOTAL HIP ARTHROPLASTY ANTERIOR APPROACH;  Surgeon: Ollen GrossFrank Aluisio, MD;  Location: [facility];  Service: Orthopedics;  Laterality: Right;  . TOTAL HIP ARTHROPLASTY Left 01/30/2019   Procedure: TOTAL HIP ARTHROPLASTY ANTERIOR APPROACH;  Surgeon: Ollen GrossAluisio, Frank, MD;  Location: [facility];  Service: Orthopedics;  Laterality: Left;  100min  . TRANSTHORACIC ECHOCARDIOGRAM  03/05/2010   EF 60-65%  . UMBILICAL HERNIA REPAIR N/A 05/14/2015   Procedure: HERNIA REPAIR UMBILICAL ADULT WITH MESH ;  Surgeon: Avel Peaceodd Rosenbower, MD;  Location: [facility];  Service: General;  Laterality: N/A;  . WISDOM TOOTH EXTRACTION     lower 2     Medications: Current Outpatient Medications  Medication Sig Dispense Refill  . acetaminophen (TYLENOL) 500 MG tablet Take 1,000 mg by mouth every 6 (six) hours as needed (for pain.).    Marland Kitchen. ALPRAZolam (XANAX) 0.25 MG tablet Take 1 tablet by mouth 2 (two) times daily.   4  . amLODipine (NORVASC) 5 MG tablet TAKE 1 TABLET BY MOUTH DAILY 90 tablet 3  . Evolocumab (REPATHA SURECLICK) 140 MG/ML SOAJ INJECT 1 PEN INTO THE SKIN EVERY 14 DAYS 2 mL 11  . ezetimibe (ZETIA) 10 MG tablet Take 1 tablet (10 mg total) by mouth daily. **PATIENT NEEDS APT FOR FURTHER REFILLS** 30 tablet 0  .  fluocinonide (LIDEX) 0.05 % external solution Apply 1 application topically daily as needed (scalp irritation.).     Marland Kitchen. metoprolol succinate (TOPROL-XL) 25 MG 24 hr tablet TAKE 1 TABLET BY MOUTH EACH MORNING 90 tablet 2  . omeprazole (PRILOSEC) 20 MG capsule Take 1 capsule (20 mg total) by mouth daily. 30 capsule 0  . propafenone (RYTHMOL SR) 325 MG 12 hr capsule Take 1 capsule (325 mg total) by mouth 2 (two) times daily. 180 capsule 2  . rivaroxaban (XARELTO) 20 MG TABS tablet Take 1 tablet (20 mg total) by mouth daily with supper. 90 tablet 1  . SYNTHROID 150 MCG tablet Take 300 mcg by mouth daily.   0  . tamsulosin (FLOMAX) 0.4 MG CAPS capsule Take 0.4 mg by mouth every evening.      No current facility-administered medications for this visit.    Allergies: Allergies  Allergen Reactions  . Crestor [Rosuvastatin Calcium] Other (See Comments)    hepatitis  . Lipitor [Atorvastatin Calcium] Other (See Comments)    Feels like someone hit him with 2x4    Social History: The patient  reports that he has never smoked. He has never used smokeless tobacco. He reports current alcohol use of about 8.0 standard drinks of alcohol per week. He reports that he does not use drugs.   Family History: The patient's family history includes Cancer in his brother and mother; Heart attack in his maternal grandfather; Hyperlipidemia in his mother; Hypertension in his brother, father, and mother.   Review of Systems: Please see the history of present illness.   Otherwise, the review of systems is positive for none.   All other systems are reviewed and negative.   Physical Exam: Blood pressure 124/82, pulse 78,  height 6' 3.5" (1.918 m), weight 263 lb 12.8 oz (119.7 kg), SpO2 97 %.  GEN:  Well nourished, well developed in no acute distress HEENT: Normal NECK: No JVD; No carotid bruits LYMPHATICS: No lymphadenopathy CARDIAC: RRR , no murmurs, rubs, gallops RESPIRATORY:  Clear to auscultation without rales,  wheezing or rhonchi  ABDOMEN: Soft, non-tender, non-distended MUSCULOSKELETAL:  No edema; No deformity  SKIN: Warm and dry NEUROLOGIC:  Alert and oriented x 3    LABORATORY DATA:  EKG:   Sept. 14, 2021:    Lab Results  Component Value Date   WBC 5.5 08/18/2019   HGB 16.0 08/18/2019   HCT 47.0 08/18/2019   PLT 240 08/18/2019   GLUCOSE 97 08/18/2019   CHOL 134 01/25/2019   TRIG 239 (H) 01/25/2019   HDL 44 01/25/2019   LDLCALC 52 01/25/2019   ALT 107 (H) 01/25/2019   AST 50 (H) 01/25/2019   NA 142 08/18/2019   K 3.7 08/18/2019   CL 105 08/18/2019   CREATININE 1.40 (H) 08/18/2019   BUN 15 08/18/2019   CO2 21 (L) 08/18/2019   TSH 0.944 01/25/2019   PSA 3.69 10/05/2016   INR 1.1 01/29/2019   HGBA1C 5.9 (H) 01/25/2019    BNP (last 3 results) No results for input(s): BNP in the last 8760 hours.  ProBNP (last 3 results) No results for input(s): PROBNP in the last 8760 hours.   Other Studies Reviewed Today:  Echo Study Conclusions from 2014  Left ventricle: The cavity size was normal. Wall thickness was increased in a pattern of mild LVH. Systolic function was normal. The estimated ejection fraction was in the range of 55% to 60%. There was an increased relative contribution of atrial contraction to ventricular filling.   Myoview Impression from 2014 Exercise Capacity: Good exercise capacity. BP Response: Normal blood pressure response. Clinical Symptoms: Mild dyspnea ECG Impression: No significant ST segment change suggestive of ischemia. Comparison with Prior Nuclear Study: No significant change from previous study  Overall Impression: Low risk stress nuclear study. No evidence for ischemia or infarction. EF mildly decreased.   LV Ejection Fraction: 53%. LV Wall Motion: Mild global hypokinesis.   ECG:    October 01, 2019: Sinus bradycardia 59.  No ST or T wave changes.  Assessment/Plan:  1. -  Chest tightness:   Getting over covid pneumonia .    Still has some chest discomfort, dyspnea.         Cannot take motrin because he is on Xarelto   2. Hypercoagulable syndrome :    -  Cont xarelto  3. Paroxysmal atrial fibrillation,   Still in NSR ,   Cont xarelto   4. Hypercholesterolemia -   Cont zetia, repatha   5. Essential hypertension -   BP is normal , cont meds.    6. Hypothyroidism followed by Dr. Evlyn Kanner  7. Osteoarthritis of right hip.  .  Status post hip replacement.  8.  Covid:  Slowly getting over Covid .  Still has symptoms  Ive given the OK for him to work out - start with moderte exercise and titrate up .   Current medicines are reviewed with the patient today.  The patient does not have concerns regarding medicines other than what has been noted above.    Will see him in 6 months .   Kristeen Miss, MD  10/01/2019 5:20 PM    [facility] [address] [place],  [address] [place], [place]  [address] Pager [phone] Phone: [phone]; Fax: [phone]

## 2019-10-01 NOTE — Patient Instructions (Signed)
Medication Instructions:  Your physician recommends that you continue on your current medications as directed. Please refer to the Current Medication list given to you today.  *If you need a refill on your cardiac medications before your next appointment, please call your pharmacy*   Lab Work: TODAY - TSH, CBC, Lipid, BMET If you have labs (blood work) drawn today and your tests are completely normal, you will receive your results only by: Marland Kitchen MyChart Message (if you have MyChart) OR . A paper copy in the mail If you have any lab test that is abnormal or we need to change your treatment, we will call you to review the results.   Testing/Procedures: None Ordered   Follow-Up: At [facility], you and your health needs are our priority.  As part of our continuing mission to provide you with exceptional heart care, we have created designated Provider Care Teams.  These Care Teams include your primary Cardiologist (physician) and Advanced Practice Providers (APPs -  Physician Assistants and Nurse Practitioners) who all work together to provide you with the care you need, when you need it.   Your next appointment:   3 month(s)  The format for your next appointment:   In Person  Provider:   You may see Kristeen Miss, MD or one of the following Advanced Practice Providers on your designated Care Team:    Tereso Newcomer, PA-C  Vin Emerado, [place]

## 2019-10-02 LAB — CBC WITH DIFFERENTIAL/PLATELET
Basophils Absolute: 0 10*3/uL (ref 0.0–0.2)
Basos: 1 %
EOS (ABSOLUTE): 0.1 10*3/uL (ref 0.0–0.4)
Eos: 1 %
Hematocrit: 45.1 % (ref 37.5–51.0)
Hemoglobin: 14.7 g/dL (ref 13.0–17.7)
Immature Grans (Abs): 0.1 10*3/uL (ref 0.0–0.1)
Immature Granulocytes: 1 %
Lymphocytes Absolute: 1.7 10*3/uL (ref 0.7–3.1)
Lymphs: 29 %
MCH: 26.8 pg (ref 26.6–33.0)
MCHC: 32.6 g/dL (ref 31.5–35.7)
MCV: 82 fL (ref 79–97)
Monocytes Absolute: 0.5 10*3/uL (ref 0.1–0.9)
Monocytes: 8 %
Neutrophils Absolute: 3.5 10*3/uL (ref 1.4–7.0)
Neutrophils: 60 %
Platelets: 189 10*3/uL (ref 150–450)
RBC: 5.49 x10E6/uL (ref 4.14–5.80)
RDW: 18.9 % — ABNORMAL HIGH (ref 11.6–15.4)
WBC: 5.8 10*3/uL (ref 3.4–10.8)

## 2019-10-02 LAB — BASIC METABOLIC PANEL
BUN/Creatinine Ratio: 13 (ref 9–20)
BUN: 16 mg/dL (ref 6–24)
CO2: 24 mmol/L (ref 20–29)
Calcium: 9.8 mg/dL (ref 8.7–10.2)
Chloride: 104 mmol/L (ref 96–106)
Creatinine, Ser: 1.28 mg/dL — ABNORMAL HIGH (ref 0.76–1.27)
GFR calc Af Amer: 71 mL/min/{1.73_m2} (ref >59)
GFR calc non Af Amer: 62 mL/min/{1.73_m2} (ref >59)
Glucose: 84 mg/dL (ref 65–99)
Potassium: 4.6 mmol/L (ref 3.5–5.2)
Sodium: 143 mmol/L (ref 134–144)

## 2019-10-02 LAB — LIPID PANEL
Chol/HDL Ratio: 2.6 ratio (ref 0.0–5.0)
Cholesterol, Total: 128 mg/dL (ref 100–199)
HDL: 49 mg/dL (ref >39)
LDL Chol Calc (NIH): 66 mg/dL (ref 0–99)
Triglycerides: 63 mg/dL (ref 0–149)
VLDL Cholesterol Cal: 13 mg/dL (ref 5–40)

## 2019-10-02 LAB — TSH: TSH: 0.626 u[IU]/mL (ref 0.450–4.500)

## 2019-10-14 ENCOUNTER — Other Ambulatory Visit: Payer: Self-pay | Admitting: Cardiovascular Disease

## 2019-10-14 NOTE — Telephone Encounter (Signed)
Pt's age 59, wt 119.7 kg, SCr 1.28, CrCl 115.6, last ov w/ PN 10/01/19.

## 2019-10-18 ENCOUNTER — Other Ambulatory Visit: Payer: Self-pay | Admitting: Physician Assistant

## 2019-10-28 ENCOUNTER — Telehealth: Payer: Self-pay | Admitting: Endocrinology

## 2019-10-28 NOTE — Telephone Encounter (Signed)
Patient called in requesting a refill on zetia and lisinopril. Thanks

## 2019-10-29 NOTE — Telephone Encounter (Signed)
Patient has not been seen in our office since 09/2018 and looks to have new PCP.   Lisinopril looks to have been d/ced in 2020 by [facility].    Left message for patient to call back to discuss. Patient either needs apt for further refills or will need to contact new PCP for refills. AS, CMA

## 2019-10-30 NOTE — Telephone Encounter (Signed)
Left message for patient to call back. AS, CMA 

## 2019-10-31 NOTE — Telephone Encounter (Signed)
Patient states he has transferred care to Dr. Evlyn Kanner and will get refills from that provider. AS, CMA

## 2019-11-04 DIAGNOSIS — L820 Inflamed seborrheic keratosis: Secondary | ICD-10-CM | POA: Diagnosis not present

## 2019-11-04 DIAGNOSIS — L570 Actinic keratosis: Secondary | ICD-10-CM | POA: Diagnosis not present

## 2019-11-04 DIAGNOSIS — L738 Other specified follicular disorders: Secondary | ICD-10-CM | POA: Diagnosis not present

## 2019-11-04 DIAGNOSIS — L821 Other seborrheic keratosis: Secondary | ICD-10-CM | POA: Diagnosis not present

## 2019-11-04 DIAGNOSIS — L0889 Other specified local infections of the skin and subcutaneous tissue: Secondary | ICD-10-CM | POA: Diagnosis not present

## 2019-11-04 DIAGNOSIS — L565 Disseminated superficial actinic porokeratosis (DSAP): Secondary | ICD-10-CM | POA: Diagnosis not present

## 2019-11-11 DIAGNOSIS — I480 Paroxysmal atrial fibrillation: Secondary | ICD-10-CM | POA: Diagnosis not present

## 2019-11-11 DIAGNOSIS — E039 Hypothyroidism, unspecified: Secondary | ICD-10-CM | POA: Diagnosis not present

## 2019-11-11 DIAGNOSIS — R7302 Impaired glucose tolerance (oral): Secondary | ICD-10-CM | POA: Diagnosis not present

## 2019-11-29 DIAGNOSIS — E785 Hyperlipidemia, unspecified: Secondary | ICD-10-CM | POA: Diagnosis not present

## 2019-11-29 DIAGNOSIS — E039 Hypothyroidism, unspecified: Secondary | ICD-10-CM | POA: Diagnosis not present

## 2019-11-29 DIAGNOSIS — N1831 Chronic kidney disease, stage 3a: Secondary | ICD-10-CM | POA: Diagnosis not present

## 2019-11-29 DIAGNOSIS — I129 Hypertensive chronic kidney disease with stage 1 through stage 4 chronic kidney disease, or unspecified chronic kidney disease: Secondary | ICD-10-CM | POA: Diagnosis not present

## 2019-12-04 ENCOUNTER — Encounter: Payer: Self-pay | Admitting: Cardiovascular Disease

## 2019-12-04 NOTE — Progress Notes (Signed)
CARDIOLOGY OFFICE NOTE  Date:  12/06/2019    Isaiah [name], Isaiah [name] Date of Birth: 05/09/1960 Medical Record #[id number]  PCP:  Adrian Prince, MD  Cardiologist:  Former patient of Dr. Yevonne Pax.   Now [name]  Chief Complaint  Patient presents with  . Hypertension  . Atrial Fibrillation  . Chest Pain   Problem list 1. History of recurrent pulmonary embolus 2. Hypercoagulability - Lupus anticoagulant  3. Hypertension 4. Osteoarthritis-status post hip replacements 5.  Paroxysmal atrial fibrillation 6. Lyme disease - 2014    Isaiah [name], Isaiah [name] is a 59 y.o. male who presents today for a 4 month check. Former patient of Dr. Yevonne Pax.   He has a history of prior PE and found to have a hypercoagulability state with abnormal lupus anticoagulant - he is committed to lifelong anticoagulation and is on Xarelto. He has had frequent DVT, PAF, hypothyroidism, HLD and HTN.  He has sleep apnea and is on CPAP - followed by Dr. Mayford Knife.   In January 2014 the patient experienced some chest tightness and underwent a treadmill Myoview stress test which showed no evidence of ischemia and his ejection fraction was 53% with no wall motion abnormalities.   Last seen in November. Cardiac status was stable.   Comes back today. Here alone. He is doing well. He had his hip replacement back in November - did well. Back on track with losing weight - says he is down 20 pounds. BP is lower. No chest pain. Breathing is good. Rhythm is good. Asking about reduction of medicines. His significant other has been diagnosed with breast cancer and he has been helping with her.    June 19, 2015:  Isaiah [name] is seen today for initial visit.   He is a former patient of [facility]. Has hx of PAF - none in the past 6-8 years.    Seems to be triggered by large doses of caffiene.  Has cardioverted on 1 occasion but usually he will convert back into NSR after a day or so .   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   No weight lifting due to a recent hernia repair .   Dec. 1, 2017:  Isaiah [name] is seen today in follow-up for his paroxysmal atrial fib ablation, diastolic dysfunction, and hypertension. No palps  June 21, 2016: Still very active.  Used to lift heavy weight and fight MMA . Does cardio regularly. Does interval  , ellipitical  Dental practice is going well.    No episodes of atrial fib - none in the past 6-8 years  Has hx of DVT ,  He walks frequently when flying   August 04, 2016:  Isaiah [name] is seen today for work in visit. He started having episodes of chest pain last week.  He went on vacation a week ago Ate too much salt, Came home ,  Took Lasix for swelling  Had a non productive cough.   Chest tightness.   Pressure like sensation No pleuretic CP,  Difficult to take a deep breath No fever , dry cough.  Felt a bit better after Z-pac.  But chest pressure persisted.  Feels different from his pulmonary embolus pain   Echo in 2014 showed normal LV function   Sept. 12, 2018:  Isaiah [name] is seen back after having an episode of chest pain  Coronary CT angio shows a prox LAD stenosis of 30%.  Calcium score of 44 ( 72 percentile for age )  On Zetia,  Had problems with crestor ( elevated liver enzymes)  and Lipitor ( profound muscle aches )  .   Feeling better.  Still working out . No CP   May 19, 2017:   Doing well. Went on [employer] diet and lost 20 lbs.   Feeling well .  Has not had any recurrent PAF  - last episode was 10 years ago   November 17, 2017:  Isaiah PicketScott is seen today for follow-up of his paroxysmal atrial fibrillation.  He also has a hypercoagulable state and is on Xarelto. Is a history of very mild coronary artery disease.  Coronary CT angiogram showed a proximal LAD stenosis of approximately 30%.  Coronary calcium score is 44.  He is on Zetia and Repatha. Very busy at work .   Has not been working out as much .  Has OSA - wears his  CPAP ,  Seems to be working well   March 29, 2019:  Isaiah PicketScott is seen today for follow-up of his paroxysmal atrial fibrillation and hypercoagulable state.  He remains on Xarelto.  He has very mild coronary artery disease.  Coronary CT angiogram showed a proximal stenosis of approximately 30%.  His coronary calcium score is 44.  Remains on Zetia and Repatha. Had a hip replacement this past year  No covid   Sept. 14, 2021:  Isaiah PicketScott is seen today for paroxysmal atrial fibrillation.  He has a hypercoagulable state and has not had a pulmonary embolus in the past.  He is on Xarelto.  CT angiogram reveals a proximal LAD stenosis of approximately 30%.  His coronary calcium score is 44.  He remains on Zetia and Repatha.  Is getting over covid pneumonia  Got back from a motorcycle trip [place] in [place].  Became very short of breath CT angio rules out PE,    Worked all week.  Spiked a temp a week later.   Thinks he may have caught covid in the ER  + night sweats ,  [facility] . Took omnicef, albuterol , monoclonal antibodies Is better now  Still thinks he feels something in his chest  O2 sats are normal .  No pleuretic CP,  Staying hydrated.   Nov. 18, 2021 Isaiah PicketScott is seen today for follow up of his HTN, PAF,  Hx of DVT  No cp. Has some DOE if he climbs several flights of stairs   Past Medical History:  Diagnosis Date  . Arthritis   . Blood dyscrasia    lupus anticoagulant  . Diastolic dysfunction    Grade I per echo in 2/12  . DVT (deep venous thrombosis) (HCC) 2000's   "both legs in the calves; left went all the way up to my groin" (09/17/2012)  . Dysrhythmia   . GERD (gastroesophageal reflux disease)   . History of bronchitis   . Hypercoagulation syndrome (HCC)    secondary to circulating lupus anticoagulant  . Hyperlipidemia   . Hypertension   . Hypothyroidism    "wiped out from taking amiodarone" (09/17/2012)  . Lyme disease    history of   . Obesity (BMI 30-39.9)  07/17/2014  . PAF (paroxysmal atrial fibrillation) (HCC)    no recurrence since Feb 2012  . PONV (postoperative nausea and vomiting)    likes scopolomanine patch  . Pulmonary embolism (HCC) 09/17/2012   "just today; 3 on the right; 2 on the left" (09/17/2012)  .  Situational stress   . Sleep apnea    uses CPAP    Past Surgical History:  Procedure Laterality Date  . CARDIOVASCULAR STRESS TEST  07/17/2009   EF 59%  . CARDIOVERSION    . DISTAL BICEPS TENDON REPAIR Right 2011  . INGUINAL HERNIA REPAIR Right 1992  . INGUINAL HERNIA REPAIR Left 2013  . INSERTION OF MESH N/A 05/14/2015   Procedure: INSERTION OF MESH;  Surgeon: Avel Peace, MD;  Location: [facility];  Service: General;  Laterality: N/A;  . SHOULDER ARTHROSCOPY W/ ROTATOR CUFF REPAIR Left 09/04/2012  . SHOULDER SURGERY     left  . TOTAL HIP ARTHROPLASTY Right 11/26/2014   Procedure: RIGHT TOTAL HIP ARTHROPLASTY ANTERIOR APPROACH;  Surgeon: Ollen Gross, MD;  Location: [facility];  Service: Orthopedics;  Laterality: Right;  . TOTAL HIP ARTHROPLASTY Left 01/30/2019   Procedure: TOTAL HIP ARTHROPLASTY ANTERIOR APPROACH;  Surgeon: Ollen Gross, MD;  Location: [facility];  Service: Orthopedics;  Laterality: Left;   . TRANSTHORACIC ECHOCARDIOGRAM  03/05/2010   EF 60-65%  . UMBILICAL HERNIA REPAIR N/A 05/14/2015   Procedure: HERNIA REPAIR UMBILICAL ADULT WITH MESH ;  Surgeon: Avel Peace, MD;  Location: [facility];  Service: General;  Laterality: N/A;  . WISDOM TOOTH EXTRACTION     lower 2     Medications: Current Outpatient Medications  Medication Sig Dispense Refill  . ALPRAZolam (XANAX) 0.25 MG tablet Take 1 tablet by mouth 2 (two) times daily.   4  . amLODipine (NORVASC) 5 MG tablet TAKE 1 TABLET BY MOUTH DAILY 90 tablet 3  . Evolocumab (REPATHA SURECLICK) 140 MG/ML SOAJ INJECT 1 PEN INTO THE SKIN EVERY 14 DAYS 2 mL 11  . ezetimibe (ZETIA) 10 MG tablet Take 1 tablet (10 mg total) by mouth daily. **PATIENT NEEDS APT FOR FURTHER  REFILLS** 30 tablet 0  . fluocinonide (LIDEX) 0.05 % external solution Apply 1 application topically daily as needed (scalp irritation.).     Marland Kitchen metoprolol succinate (TOPROL-XL) 25 MG 24 hr tablet TAKE 1 TABLET BY MOUTH EACH MORNING 90 tablet 2  . omeprazole (PRILOSEC) 20 MG capsule Take 1 capsule (20 mg total) by mouth daily. 30 capsule 0  . propafenone (RYTHMOL SR) 325 MG 12 hr capsule Take 1 capsule (325 mg total) by mouth 2 (two) times daily. 180 capsule 2  . SYNTHROID 150 MCG tablet Take 300 mcg by mouth daily.   0  . tamsulosin (FLOMAX) 0.4 MG CAPS capsule Take 0.4 mg by mouth every evening.     Carlena Hurl 20 MG TABS tablet TAKE 1 TABLET BY MOUTH DAILY 90 tablet 1   No current facility-administered medications for this visit.    Allergies: Allergies  Allergen Reactions  . Crestor [Rosuvastatin Calcium] Other (See Comments)    hepatitis  . Lipitor [Atorvastatin Calcium] Other (See Comments)    Feels like someone hit him with 2x4    Social History: The patient  reports that he has never smoked. He has never used smokeless tobacco. He reports current alcohol use of about 8.0 standard drinks of alcohol per week. He reports that he does not use drugs.   Family History: The patient's family history includes Cancer in his brother and mother; Heart attack in his maternal grandfather; Hyperlipidemia in his mother; Hypertension in his brother, father, and mother.   Review of Systems: Please see the history of present illness.   Otherwise, the review of systems is positive for none.   All other systems are  reviewed and negative.   Physical Exam: Blood pressure 118/76, pulse 73, height 6\' 3"  (1.905 m), weight 263 lb (119.3 kg), SpO2 97 %.  GEN:  Well nourished, well developed in no acute distress HEENT: Normal NECK: No JVD; No carotid bruits LYMPHATICS: No lymphadenopathy CARDIAC: RRR , no murmurs, rubs, gallops RESPIRATORY:  Clear to auscultation without rales, wheezing or rhonchi   ABDOMEN: Soft, non-tender, non-distended MUSCULOSKELETAL:  No edema; No deformity  SKIN: Warm and dry NEUROLOGIC:  Alert and oriented x 3    LABORATORY DATA:  EKG:   Sept. 14, 2021:    Lab Results  Component Value Date   WBC 5.8 10/01/2019   HGB 14.7 10/01/2019   HCT 45.1 10/01/2019   PLT 189 10/01/2019   GLUCOSE 84 10/01/2019   CHOL 128 10/01/2019   TRIG 63 10/01/2019   HDL 49 10/01/2019   LDLCALC 66 10/01/2019   ALT 107 (H) 01/25/2019   AST 50 (H) 01/25/2019   NA 143 10/01/2019   K 4.6 10/01/2019   CL 104 10/01/2019   CREATININE 1.28 (H) 10/01/2019   BUN 16 10/01/2019   CO2 24 10/01/2019   TSH 0.626 10/01/2019   PSA 3.69 10/05/2016   INR 1.1 01/29/2019   HGBA1C 5.9 (H) 01/25/2019    BNP (last 3 results) No results for input(s): BNP in the last 8760 hours.  ProBNP (last 3 results) No results for input(s): PROBNP in the last 8760 hours.   Other Studies Reviewed Today:  Echo Study Conclusions from 2014  Left ventricle: The cavity size was normal. Wall thickness was increased in a pattern of mild LVH. Systolic function was normal. The estimated ejection fraction was in the range of 55% to 60%. There was an increased relative contribution of atrial contraction to ventricular filling.   Myoview Impression from 2014 Exercise Capacity: Good exercise capacity. BP Response: Normal blood pressure response. Clinical Symptoms: Mild dyspnea ECG Impression: No significant ST segment change suggestive of ischemia. Comparison with Prior Nuclear Study: No significant change from previous study  Overall Impression: Low risk stress nuclear study. No evidence for ischemia or infarction. EF mildly decreased.   LV Ejection Fraction: 53%. LV Wall Motion: Mild global hypokinesis.   ECG:      Assessment/Plan:  1. -  Chest tightness:    Ding well   2. Hypercoagulable syndrome :    - cont xarelto ,  No DVT    3. Paroxysmal atrial fibrillation,   Is in  NSR   4. Hypercholesterolemia -  On pralulent,  Labs look great.   Will recheck labs in 6 months - lipids, liver , bmp   5. Essential hypertension -     BP looks good.   Advised continued diet, exercise, weight loss   6. Hypothyroidism:   7. Osteoarthritis of right hip.  .  8.  Covid:  - still has some DOE.   Lungs are clear.  Gradually improving .    Current medicines are reviewed with the patient today.  The patient does not have concerns regarding medicines other than what has been noted above.    Will see him in 6 months .   2015, MD  12/06/2019 8:42 AM    [facility] [address] [place],  [address] [place], [place]  [place] Pager [phone] Phone: [phone]; Fax: [phone]

## 2019-12-06 ENCOUNTER — Ambulatory Visit (INDEPENDENT_AMBULATORY_CARE_PROVIDER_SITE_OTHER): Payer: BC Managed Care – PPO | Admitting: Cardiovascular Disease

## 2019-12-06 ENCOUNTER — Encounter: Payer: Self-pay | Admitting: Cardiovascular Disease

## 2019-12-06 ENCOUNTER — Other Ambulatory Visit: Payer: Self-pay

## 2019-12-06 VITALS — BP 118/76 | HR 73 | Ht 75.0 in | Wt 263.0 lb

## 2019-12-06 DIAGNOSIS — E782 Mixed hyperlipidemia: Secondary | ICD-10-CM

## 2019-12-06 DIAGNOSIS — I480 Paroxysmal atrial fibrillation: Secondary | ICD-10-CM | POA: Diagnosis not present

## 2019-12-06 DIAGNOSIS — N183 Chronic kidney disease, stage 3 unspecified: Secondary | ICD-10-CM

## 2019-12-06 DIAGNOSIS — I10 Essential (primary) hypertension: Secondary | ICD-10-CM

## 2019-12-06 MED ORDER — AMLODIPINE BESYLATE 5 MG PO TABS: 5.0000 mg | ORAL_TABLET | Freq: Every day | ORAL | 3 refills | Status: DC

## 2019-12-06 NOTE — Patient Instructions (Signed)
Medication Instructions:  Your physician recommends that you continue on your current medications as directed. Please refer to the Current Medication list given to you today.  *If you need a refill on your cardiac medications before your next appointment, please call your pharmacy*   Lab Work: Your physician recommends that you return for lab work in: 6 months on the day of or a few days before your office visit with Dr. [name].  You will need to FAST for this appointment - nothing to eat or drink after midnight the night before except water.  If you have labs (blood work) drawn today and your tests are completely normal, you will receive your results only by: . MyChart Message (if you have MyChart) OR . A paper copy in the mail If you have any lab test that is abnormal or we need to change your treatment, we will call you to review the results.   Testing/Procedures: None Ordered    Follow-Up: At [facility], you and your health needs are our priority.  As part of our continuing mission to provide you with exceptional heart care, we have created designated Provider Care Teams.  These Care Teams include your primary Cardiologist (physician) and Advanced Practice Providers (APPs -  Physician Assistants and Nurse Practitioners) who all work together to provide you with the care you need, when you need it.   Your next appointment:   6 month(s)  The format for your next appointment:   In Person  Provider:   You may see Philip [name], MD or one of the following Advanced Practice Providers on your designated Care Team:    [name] Weaver, PA-C  Vin Bhagat, PA-C     

## 2019-12-09 ENCOUNTER — Other Ambulatory Visit: Payer: Self-pay

## 2019-12-09 DIAGNOSIS — I10 Essential (primary) hypertension: Secondary | ICD-10-CM

## 2019-12-09 DIAGNOSIS — N183 Chronic kidney disease, stage 3 unspecified: Secondary | ICD-10-CM

## 2019-12-25 ENCOUNTER — Telehealth: Payer: Self-pay | Admitting: Cardiovascular Disease

## 2019-12-25 MED ORDER — METOPROLOL SUCCINATE ER 25 MG PO TB24: ORAL_TABLET | ORAL | 3 refills | Status: DC

## 2019-12-25 NOTE — Telephone Encounter (Signed)
*  STAT* If patient is at the pharmacy, call can be transferred to refill team.   1. Which medications need to be refilled? (please list name of each medication and dose if known)  metoprolol succinate (TOPROL-XL) 25 MG 24 hr tablet  2. Which pharmacy/location (including street and city if local pharmacy) is medication to be sent to? [facility] [place] - [address] [place] [address]  3. Do they need a 30 day or 90 day supply? 90 day supply   Only has 1 tablet left.

## 2019-12-25 NOTE — Telephone Encounter (Signed)
Pt's medication was sent to pt's pharmacy as requested. Confirmation received.  °

## 2020-01-03 DIAGNOSIS — M5459 Other low back pain: Secondary | ICD-10-CM | POA: Diagnosis not present

## 2020-01-08 DIAGNOSIS — M9903 Segmental and somatic dysfunction of lumbar region: Secondary | ICD-10-CM | POA: Diagnosis not present

## 2020-01-08 DIAGNOSIS — M9904 Segmental and somatic dysfunction of sacral region: Secondary | ICD-10-CM | POA: Diagnosis not present

## 2020-01-08 DIAGNOSIS — M5136 Other intervertebral disc degeneration, lumbar region: Secondary | ICD-10-CM | POA: Diagnosis not present

## 2020-01-08 DIAGNOSIS — M5386 Other specified dorsopathies, lumbar region: Secondary | ICD-10-CM | POA: Diagnosis not present

## 2020-02-07 DIAGNOSIS — M1990 Unspecified osteoarthritis, unspecified site: Secondary | ICD-10-CM | POA: Insufficient documentation

## 2020-02-07 DIAGNOSIS — L930 Discoid lupus erythematosus: Secondary | ICD-10-CM | POA: Insufficient documentation

## 2020-02-07 DIAGNOSIS — I2699 Other pulmonary embolism without acute cor pulmonale: Secondary | ICD-10-CM | POA: Insufficient documentation

## 2020-02-07 DIAGNOSIS — I82409 Acute embolism and thrombosis of unspecified deep veins of unspecified lower extremity: Secondary | ICD-10-CM | POA: Insufficient documentation

## 2020-02-07 DIAGNOSIS — I499 Cardiac arrhythmia, unspecified: Secondary | ICD-10-CM | POA: Insufficient documentation

## 2020-02-07 DIAGNOSIS — E7800 Pure hypercholesterolemia, unspecified: Secondary | ICD-10-CM | POA: Insufficient documentation

## 2020-02-26 ENCOUNTER — Other Ambulatory Visit: Payer: Self-pay | Admitting: Cardiovascular Disease

## 2020-03-05 DIAGNOSIS — M545 Low back pain, unspecified: Secondary | ICD-10-CM | POA: Diagnosis not present

## 2020-03-05 DIAGNOSIS — M5430 Sciatica, unspecified side: Secondary | ICD-10-CM | POA: Diagnosis not present

## 2020-03-09 DIAGNOSIS — M545 Low back pain, unspecified: Secondary | ICD-10-CM | POA: Diagnosis not present

## 2020-03-09 DIAGNOSIS — M5430 Sciatica, unspecified side: Secondary | ICD-10-CM | POA: Diagnosis not present

## 2020-03-16 DIAGNOSIS — R7302 Impaired glucose tolerance (oral): Secondary | ICD-10-CM | POA: Diagnosis not present

## 2020-03-16 DIAGNOSIS — E039 Hypothyroidism, unspecified: Secondary | ICD-10-CM | POA: Diagnosis not present

## 2020-03-16 DIAGNOSIS — Z125 Encounter for screening for malignant neoplasm of prostate: Secondary | ICD-10-CM | POA: Diagnosis not present

## 2020-03-16 DIAGNOSIS — M5430 Sciatica, unspecified side: Secondary | ICD-10-CM | POA: Diagnosis not present

## 2020-03-16 DIAGNOSIS — M545 Low back pain, unspecified: Secondary | ICD-10-CM | POA: Diagnosis not present

## 2020-03-16 DIAGNOSIS — E291 Testicular hypofunction: Secondary | ICD-10-CM | POA: Diagnosis not present

## 2020-03-16 DIAGNOSIS — I480 Paroxysmal atrial fibrillation: Secondary | ICD-10-CM | POA: Diagnosis not present

## 2020-03-16 DIAGNOSIS — D6859 Other primary thrombophilia: Secondary | ICD-10-CM | POA: Diagnosis not present

## 2020-03-20 DIAGNOSIS — M5459 Other low back pain: Secondary | ICD-10-CM | POA: Diagnosis not present

## 2020-03-20 DIAGNOSIS — M5416 Radiculopathy, lumbar region: Secondary | ICD-10-CM | POA: Diagnosis not present

## 2020-03-20 DIAGNOSIS — M533 Sacrococcygeal disorders, not elsewhere classified: Secondary | ICD-10-CM | POA: Diagnosis not present

## 2020-03-22 ENCOUNTER — Encounter: Payer: Self-pay | Admitting: Cardiovascular Disease

## 2020-03-22 NOTE — Progress Notes (Signed)
CARDIOLOGY OFFICE NOTE  Date:  03/22/2020    Isaiah [name], [name] Date of Birth: [birth year] Medical Record #[id number]  PCP:  Adrian Prince, MD  Cardiologist:  Former patient of Dr. Yevonne Pax.   Now Isaiah [name]  Chief Complaint  Patient presents with   Hypertension   Atrial Fibrillation   Hyperlipidemia   Problem list 1. History of recurrent pulmonary embolus 2. Hypercoagulability - Lupus anticoagulant  3. Hypertension 4. Osteoarthritis-status post hip replacements 5.  Paroxysmal atrial fibrillation 6. 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 Former patient of Dr. Yevonne Pax.   He has a history of prior PE and found to have a hypercoagulability state with abnormal lupus anticoagulant - he is committed to lifelong anticoagulation and is on Xarelto. He has had frequent DVT, PAF, hypothyroidism, HLD and HTN.  He has sleep apnea and is on CPAP - followed by Dr. Mayford Knife.   In January 2014 the patient experienced some chest tightness and underwent a treadmill Myoview stress test which showed no evidence of ischemia and his ejection fraction was 53% with no wall motion abnormalities.   Last seen in November. Cardiac status was stable.   Comes back today. Here alone. He is doing well. He had his hip replacement back in November - did well. Back on track with losing weight - says he is down 20 pounds. BP is lower. No chest pain. Breathing is good. Rhythm is good. Asking about reduction of medicines. His significant other has been diagnosed with breast cancer and he has been helping with her.    June 19, 2015:  Isaiah [name] is seen today for initial visit.   He is a former patient of [facility]. Has hx of PAF - none in the past 6-8 years.    Seems to be triggered by large doses of caffiene.  Has cardioverted on 1 occasion but usually he will convert back into NSR after a day or so .   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   No weight lifting due to a recent hernia repair .   Dec. 1, 2017:  Isaiah [name] is seen today in follow-up for his paroxysmal atrial fib ablation, diastolic dysfunction, and hypertension. No palps  June 21, 2016: Still very active.  Used to lift heavy weight and fight MMA . Does cardio regularly. Does interval  , ellipitical  Dental practice is going well.    No episodes of atrial fib - none in the past 6-8 years  Has hx of DVT ,  He walks frequently when flying   August 04, 2016:  Isaiah [name] is seen today for work in visit. He started having episodes of chest pain last week.  He went on vacation a week ago Ate too much salt, Came home ,  Took Lasix for swelling  Had a non productive cough.   Chest tightness.   Pressure like sensation No pleuretic CP,  Difficult to take a deep breath No fever , dry cough.  Felt a bit better after Z-pac.  But chest pressure persisted.  Feels different from his pulmonary embolus pain   Echo in 2014 showed normal LV function   Sept. 12, 2018:  Isaiah [name] is seen back after having an episode of chest pain  Coronary CT angio shows a prox LAD stenosis of 30%.  Calcium  score of 44 ( 72 percentile for age )  On Zetia,  Had problems with crestor ( elevated liver enzymes)  and Lipitor ( profound muscle aches )  .   Feeling better.  Still working out . No CP   May 19, 2017:   Doing well. Went on [employer] and lost 20 lbs.   Feeling well .  Has not had any recurrent PAF  - last episode was 10 years ago   November 17, 2017:  Isaiah [name] is seen today for follow-up of his paroxysmal atrial fibrillation.  He also has a hypercoagulable state and is on Xarelto. Is a history of very mild coronary artery disease.  Coronary CT angiogram showed a proximal LAD stenosis of approximately 30%.  Coronary calcium score is 44.  He is on Zetia and Repatha. Very busy at work .   Has not been working out as much .  Has OSA -  wears his CPAP ,  Seems to be working well   March 29, 2019:  Isaiah [name] is seen today for follow-up of his paroxysmal atrial fibrillation and hypercoagulable state.  He remains on Xarelto.  He has very mild coronary artery disease.  Coronary CT angiogram showed a proximal stenosis of approximately 30%.  His coronary calcium score is 44.  Remains on Zetia and Repatha. Had a hip replacement this past year  No covid   Sept. 14, 2021:  Isaiah [name] is seen today for paroxysmal atrial fibrillation.  He has a hypercoagulable state and has not had a pulmonary embolus in the past.  He is on Xarelto.  CT angiogram reveals a proximal LAD stenosis of approximately 30%.  His coronary calcium score is 44.  He remains on Zetia and Repatha.  Is getting over covid pneumonia  Got back from a motorcycle trip up in [place].  Became very short of breath CT angio rules out PE,    Worked all week.  Spiked a temp a week later.   Thinks he may have caught covid in the ER  + night sweats ,  Went to [employer] [facility] [employer] . Took omnicef, albuterol , monoclonal antibodies Is better now  Still thinks he feels something in his chest  O2 sats are normal .  No pleuretic CP,  Staying hydrated.   Nov. 18, 2021 Isaiah [name] is seen today for follow up of his HTN, PAF,  Hx of DVT  No cp. Has some DOE if he climbs several flights of stairs   March 23, 2020: Isaiah [name] is seen today for follow up of his HTN, PAF, hx of DVT. Reported some increasing DOE at his last visit Had a chest CT angiogram in Aug. 2021 which was negative for PE  Is down 20 lbs ,  BP looks great  Needs to have a ventral hernia repair ( Dr. Ronelle Nigh [place]  Going to [employer], [employer].    Past Medical History:  Diagnosis Date   Arthritis    Blood dyscrasia    lupus anticoagulant   Diastolic dysfunction    Grade I per echo in 2/12   DVT (deep venous thrombosis) (HCC) 2000's   "both legs in the calves; left went all the way up to my groin" (09/17/2012)    Dysrhythmia    GERD (gastroesophageal reflux disease)    History of bronchitis    Hypercoagulation syndrome (HCC)    secondary to circulating lupus anticoagulant   Hyperlipidemia    Hypertension    Hypothyroidism    "wiped out  from taking amiodarone" (09/17/2012)   Lyme disease    history of    Obesity (BMI 30-39.9) 07/17/2014   PAF (paroxysmal atrial fibrillation) (HCC)    no recurrence since Feb 2012   PONV (postoperative nausea and vomiting)    likes scopolomanine patch   Pulmonary embolism (HCC) 09/17/2012   "just today; 3 on the right; 2 on the left" (09/17/2012)   Situational stress    Sleep apnea    uses CPAP    Past Surgical History:  Procedure Laterality Date   CARDIOVASCULAR STRESS TEST  07/17/2009   EF 59%   CARDIOVERSION     DISTAL BICEPS TENDON REPAIR Right 2011   INGUINAL HERNIA REPAIR Right 1992   INGUINAL HERNIA REPAIR Left 2013   INSERTION OF MESH N/A 05/14/2015   Procedure: INSERTION OF MESH;  Surgeon: Avel Peaceodd Rosenbower, MD;  Location: [facility];  Service: General;  Laterality: N/A;   SHOULDER ARTHROSCOPY W/ ROTATOR CUFF REPAIR Left 09/04/2012   SHOULDER SURGERY     left   TOTAL HIP ARTHROPLASTY Right 11/26/2014   Procedure: RIGHT TOTAL HIP ARTHROPLASTY ANTERIOR APPROACH;  Surgeon: Ollen GrossFrank Aluisio, MD;  Location: [facility];  Service: Orthopedics;  Laterality: Right;   TOTAL HIP ARTHROPLASTY Left 01/30/2019   Procedure: TOTAL HIP ARTHROPLASTY ANTERIOR APPROACH;  Surgeon: Ollen GrossAluisio, Frank, MD;  Location: [facility];  Service: Orthopedics;  Laterality: Left;  100min   TRANSTHORACIC ECHOCARDIOGRAM  03/05/2010   EF 60-65%   UMBILICAL HERNIA REPAIR N/A 05/14/2015   Procedure: HERNIA REPAIR UMBILICAL ADULT WITH MESH ;  Surgeon: Avel Peaceodd Rosenbower, MD;  Location: [facility];  Service: General;  Laterality: N/A;   WISDOM TOOTH EXTRACTION     lower 2     Medications: Current Outpatient Medications  Medication Sig Dispense Refill   ALPRAZolam (XANAX) 0.25 MG tablet  Take 1 tablet by mouth 2 (two) times daily.   4   amLODipine (NORVASC) 5 MG tablet Take 1 tablet (5 mg total) by mouth daily. 90 tablet 3   Evolocumab (REPATHA SURECLICK) 140 MG/ML SOAJ INJECT 1 PEN INTO THE SKIN EVERY 14 DAYS 2 mL 11   ezetimibe (ZETIA) 10 MG tablet Take 1 tablet (10 mg total) by mouth daily. **PATIENT NEEDS APT FOR FURTHER REFILLS** 30 tablet 0   fluocinonide (LIDEX) 0.05 % external solution Apply 1 application topically daily as needed (scalp irritation.).      metoprolol succinate (TOPROL-XL) 25 MG 24 hr tablet TAKE 1 TABLET BY MOUTH EACH MORNING 90 tablet 3   omeprazole (PRILOSEC) 20 MG capsule Take 1 capsule (20 mg total) by mouth daily. 30 capsule 0   propafenone (RYTHMOL SR) 325 MG 12 hr capsule TAKE 1 CAPSULE BY MOUTH TWICE DAILY 180 capsule 3   SYNTHROID 150 MCG tablet Take 300 mcg by mouth daily.   0   tamsulosin (FLOMAX) 0.4 MG CAPS capsule Take 0.4 mg by mouth every evening.      XARELTO 20 MG TABS tablet TAKE 1 TABLET BY MOUTH DAILY 90 tablet 1   No current facility-administered medications for this visit.    Allergies: Allergies  Allergen Reactions   Crestor [Rosuvastatin Calcium] Other (See Comments)    hepatitis   Lipitor [Atorvastatin Calcium] Other (See Comments)    Feels like someone hit him with 2x4    Social History: The patient  reports that he has never smoked. He has never used smokeless tobacco. He reports current alcohol use of about 8.0 standard drinks of alcohol per week. He reports  that he does not use drugs.   Family History: The patient's family history includes Cancer in his brother and mother; Heart attack in his maternal grandfather; Hyperlipidemia in his mother; Hypertension in his brother, father, and mother.   Review of Systems: Please see the history of present illness.   Otherwise, the review of systems is positive for none.   All other systems are reviewed and negative.   Physical Exam: There were no vitals taken  for this visit.  GEN:  Well nourished, well developed in no acute distress HEENT: Normal NECK: No JVD; No carotid bruits LYMPHATICS: No lymphadenopathy CARDIAC: RRR , no murmurs, rubs, gallops RESPIRATORY:  Clear to auscultation without rales, wheezing or rhonchi  ABDOMEN: Soft, non-tender, non-distended MUSCULOSKELETAL:  No edema; No deformity  SKIN: Warm and dry NEUROLOGIC:  Alert and oriented x 3    LABORATORY DATA:  EKG:     NSR at 61.  No ST or T wave changes.     Lab Results  Component Value Date   WBC 5.8 10/01/2019   HGB 14.7 10/01/2019   HCT 45.1 10/01/2019   PLT 189 10/01/2019   GLUCOSE 84 10/01/2019   CHOL 128 10/01/2019   TRIG 63 10/01/2019   HDL 49 10/01/2019   LDLCALC 66 10/01/2019   ALT 107 (H) 01/25/2019   AST 50 (H) 01/25/2019   NA 143 10/01/2019   K 4.6 10/01/2019   CL 104 10/01/2019   CREATININE 1.28 (H) 10/01/2019   BUN 16 10/01/2019   CO2 24 10/01/2019   TSH 0.626 10/01/2019   PSA 3.69 10/05/2016   INR 1.1 01/29/2019   HGBA1C 5.9 (H) 01/25/2019    BNP (last 3 results) No results for input(s): BNP in the last 8760 hours.  ProBNP (last 3 results) No results for input(s): PROBNP in the last 8760 hours.   Other Studies Reviewed Today:  Echo Study Conclusions from 2014  Left ventricle: The cavity size was normal. Wall thickness was increased in a pattern of mild LVH. Systolic function was normal. The estimated ejection fraction was in the range of 55% to 60%. There was an increased relative contribution of atrial contraction to ventricular filling.   Myoview Impression from 2014 Exercise Capacity: Good exercise capacity. BP Response: Normal blood pressure response. Clinical Symptoms: Mild dyspnea ECG Impression: No significant ST segment change suggestive of ischemia. Comparison with Prior Nuclear Study: No significant change from previous study  Overall Impression: Low risk stress nuclear study. No evidence for ischemia or  infarction. EF mildly decreased.   LV Ejection Fraction: 53%. LV Wall Motion: Mild global hypokinesis.   ECG:      Assessment/Plan:  1. -  Pre Op eval:   He is to have a ventral hernia repair.   He had a CBC drawn at his medical doctor's office.  He still needs a complete metabolic profile and an EKG which we have obtained today. Isaiah [name] is at low risk for his upcoming abdominal wall surgery.  He may hold his Xarelto the night before his surgery.   He should restart the Xarelto as soon as it is deemed safe from a surgical standpoint.  He has had multiple pulmonary emboli in the past.  I encouraged him to get some thigh high compression hose to wear immediately after surgery.  2. Hypercoagulable syndrome :    -  Cont xarelto,  Ok to hold the night prior to his abdominal wall surgery   3. Paroxysmal atrial fibrillation,   Remains  in NSR   4. Hypercholesterolemia -     lipds have been well controlled.   5. Essential hypertension -      BP looks great.  Cont med.   6. Hypothyroidism:   7. Osteoarthritis of right hip.  .    Current medicines are reviewed with the patient today.  The patient does not have concerns regarding medicines other than what has been noted above.    Will see him in 6 months .   Kristeen Miss, MD  03/22/2020 8:42 PM    [facility] [address] [place],  [address] [place], [place]  [address] Pager [phone] Phone: [phone]; Fax: [phone]

## 2020-03-23 ENCOUNTER — Ambulatory Visit: Payer: BC Managed Care – PPO | Admitting: Cardiovascular Disease

## 2020-03-23 ENCOUNTER — Other Ambulatory Visit: Payer: Self-pay

## 2020-03-23 ENCOUNTER — Encounter: Payer: Self-pay | Admitting: Cardiovascular Disease

## 2020-03-23 VITALS — BP 104/64 | HR 68 | Ht 75.0 in | Wt 253.0 lb

## 2020-03-23 DIAGNOSIS — I2699 Other pulmonary embolism without acute cor pulmonale: Secondary | ICD-10-CM | POA: Diagnosis not present

## 2020-03-23 DIAGNOSIS — M545 Low back pain, unspecified: Secondary | ICD-10-CM | POA: Diagnosis not present

## 2020-03-23 DIAGNOSIS — I10 Essential (primary) hypertension: Secondary | ICD-10-CM | POA: Diagnosis not present

## 2020-03-23 LAB — BASIC METABOLIC PANEL
BUN/Creatinine Ratio: 10 (ref 9–20)
BUN: 17 mg/dL (ref 6–24)
CO2: 22 mmol/L (ref 20–29)
Calcium: 9.3 mg/dL (ref 8.7–10.2)
Chloride: 104 mmol/L (ref 96–106)
Creatinine, Ser: 1.64 mg/dL — ABNORMAL HIGH (ref 0.76–1.27)
Glucose: 100 mg/dL — ABNORMAL HIGH (ref 65–99)
Potassium: 4.7 mmol/L (ref 3.5–5.2)
Sodium: 140 mmol/L (ref 134–144)
eGFR: 48 mL/min/{1.73_m2} — ABNORMAL LOW (ref >59)

## 2020-03-23 LAB — HEPATIC FUNCTION PANEL
ALT: 26 IU/L (ref 0–44)
AST: 38 IU/L (ref 0–40)
Albumin: 4.2 g/dL (ref 3.8–4.9)
Alkaline Phosphatase: 73 IU/L (ref 44–121)
Bilirubin Total: 0.8 mg/dL (ref 0.0–1.2)
Bilirubin, Direct: 0.22 mg/dL (ref 0.00–0.40)
Total Protein: 6.7 g/dL (ref 6.0–8.5)

## 2020-03-23 NOTE — Patient Instructions (Addendum)
Medication Instructions:  Your physician recommends that you continue on your current medications as directed. Please refer to the Current Medication list given to you today.   *If you need a refill on your cardiac medications before your next appointment, please call your pharmacy*   Lab Work: TODAY: BMET, Hepatic panel   If you have labs (blood work) drawn today and your tests are completely normal, you will receive your results only by: Marland Kitchen MyChart Message (if you have MyChart) OR . A paper copy in the mail If you have any lab test that is abnormal or we need to change your treatment, we will call you to review the results.   Testing/Procedures: none   Follow-Up: At [facility], you and your health needs are our priority.  As part of our continuing mission to provide you with exceptional heart care, we have created designated Provider Care Teams.  These Care Teams include your primary Cardiologist (physician) and Advanced Practice Providers (APPs -  Physician Assistants and Nurse Practitioners) who all work together to provide you with the care you need, when you need it.   Your next appointment:   6 month(s)  The format for your next appointment:   In Person  Provider:   You may see Kristeen Miss, MD or one of the following Advanced Practice Providers on your designated Care Team:    Tereso Newcomer, PA-C  Vin Bhagat, [place]   OTHER INSTRUCTIONS:  When you are ready for surgery, it is okay to hold your xarelto the evening before your procedure per Dr. Elease Hashimoto

## 2020-03-23 NOTE — Addendum Note (Signed)
Addended by: Melanee Spry on: 03/23/2020 12:58 PM   Modules accepted: Orders

## 2020-03-24 ENCOUNTER — Telehealth: Payer: Self-pay

## 2020-03-24 DIAGNOSIS — N1831 Chronic kidney disease, stage 3a: Secondary | ICD-10-CM

## 2020-03-24 NOTE — Telephone Encounter (Signed)
The patient called back. He is concerned about kidney function (specifically GFR since it has dropped from 66 to 48 in several months). He requests a repeat lab drawn in a couple weeks and he will try to stay hydrated in the mean time. Repeat BMET scheduled 3/24 per request.

## 2020-03-24 NOTE — Telephone Encounter (Signed)
-----   Message from Vesta Mixer, MD sent at 03/23/2020  5:02 PM EST ----- Labs are stable  His liver enzymes have improved

## 2020-03-25 ENCOUNTER — Telehealth: Payer: Self-pay | Admitting: *Deleted

## 2020-03-25 MED ORDER — ENOXAPARIN SODIUM 120 MG/0.8ML ~~LOC~~ SOLN: 120.0000 mg | Freq: Two times a day (BID) | SUBCUTANEOUS | 0 refills | Status: DC

## 2020-03-25 NOTE — Telephone Encounter (Signed)
We discussed the possibility of needing a spinal injection and he did not think his surgery would involve a spinal injection.   He has had recurrent DVT and pulmonary emboli.   I would favor holding Xarelto for 3 days with Lovenox bridging up until the time of surgery .   Aundra Millet, what are your thoughts on this ? He is otherwise at low risk for surgery

## 2020-03-25 NOTE — Telephone Encounter (Signed)
I agree with the dosing schedule as outlined by Margaretmary Dys, [facility]

## 2020-03-25 NOTE — Telephone Encounter (Signed)
   [facility] Pre-operative Risk Assessment    HEARTCARE STAFF: - Please ensure there is not already an duplicate clearance open for this procedure. - Under Visit Info/Reason for Call, type in Other and utilize the format Clearance MM/DD/YY or Clearance TBD. Do not use dashes or single digits. - If request is for dental extraction, please clarify the # of teeth to be extracted.  Request for surgical clearance:  1. What type of surgery is being performed?  L S1 TF ES1   2. When is this surgery scheduled?  TBD   3. What type of clearance is required (medical clearance vs. Pharmacy clearance to hold med vs. Both)?  BOTH  4. Are there any medications that need to be held prior to surgery and how long? Verdon   5. Practice name and name of physician performing surgery?  [facility] / DR. Thedore Mins   6. What is the office phone number?  [phone]   7.   What is the office fax number?  [phone] ATTN:  X-RAY  8.   Anesthesia type (None, local, MAC, general) ?     Jeanann Lewandowsky 03/25/2020, 12:45 PM  _________________________________________________________________   (provider comments below)

## 2020-03-25 NOTE — Telephone Encounter (Addendum)
Yes we can coordinate Lovenox bridge with pt while he holds his Xarelto for 3 days prior to spinal injection. Spoke with pt who states he has used Lovenox in the past and feels comfortable with injections. Below instructions relayed to pt on the phone, have also sent them to him via MyChart. Of note, pt states his procedure is next Tuesday 3/15, not 3/14 as was previously listed.  Pt weighs 115kg, will use nearest syringe size of 120mg  Lovenox.  3/11: Last dose of Xarelto in the evening  3/12: Inject Lovenox 120mg  SQ in fatty tissue of the abdomen at 8pm. No Xarelto.  3/13: Inject Lovenox 120mg  SQ in fatty tissue of the abdomen at 8am and 8pm. No Xarelto.  3/14: Inject Lovenox 120mg  SQ in fatty tissue of the abdomen at 8am. No PM Lovenox injection. No Xarelto.  3/15: Procedure day, no Xarelto, no Lovenox.  3/16: resume Xarelto in the evening or as instructed by MD

## 2020-03-25 NOTE — Telephone Encounter (Signed)
   Primary Cardiologist: Kristeen Miss, MD  Chart reviewed as part of pre-operative protocol coverage. Patient was contacted 03/24/2020 in reference to pre-operative risk assessment for pending surgery as outlined below.  Isaiah [name], Isaiah [name] was last seen on 03/23/20 by Dr. Elease Hashimoto.  Since that day, Isaiah [name], Isaiah [name] has done well. He can complete 4.0 METS without angina. He will need lovenox bridge as below:  Per our clinical pharmacist: Patient with diagnosis of atrial fibrillation and hypercoagulability state with positive lupus anticoagulant and hx of multiple DVTs/PE on Xarelto for anticoagulation.    Procedure: L S1 TF ESI Date of procedure: TBD  CHA2DS2-VASc Score = 4  This indicates a 4.8% annual risk of stroke. The patient's score is based upon: CHF History: No HTN History: Yes Diabetes History: No Stroke History: Yes (recurrent VTE) Vascular Disease History: Yes Age Score: 0 Gender Score: 0   CrCl 37mL/min using adjusted body weight Platelet count 189K  Typically hold Xarelto for 3 days prior to [facility], however patient is at elevated CV risk off of anticoagulation given his history of multiple DVTs/PEs and hypercoagulability state.   He will require lovenox bridge that will be coordinated by our office.  Therefore, based on ACC/AHA guidelines, the patient would be at acceptable risk for the planned procedure without further cardiovascular testing.   The patient was advised that if he develops new symptoms prior to surgery to contact our office to arrange for a follow-up visit, and he verbalized understanding.  I will route this recommendation to the requesting party via Epic fax function and remove from pre-op pool. Please call with questions.  Roe Rutherford Duke, PA 03/25/2020, 5:10 PM

## 2020-03-25 NOTE — Telephone Encounter (Signed)
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    Procedure: L S1 TF ESI Date of procedure: TBD  CHA2DS2-VASc Score = 4  This indicates a 4.8% annual risk of stroke. The patient's score is based upon: CHF History: No HTN History: Yes Diabetes History: No Stroke History: Yes (recurrent VTE) Vascular Disease History: Yes Age Score: 0 Gender Score: 0   CrCl 43mL/min using adjusted body weight Platelet count 189K  Typically hold Xarelto for 3 days prior to [facility], however patient is at elevated CV risk off of anticoagulation given his history of multiple DVTs/PEs and hypercoagulability state. Will route to MD for input.

## 2020-03-25 NOTE — Telephone Encounter (Signed)
Surgery date is 03/30/20

## 2020-03-25 NOTE — Telephone Encounter (Signed)
Pt is cleared for injection. Also see Dr. Harvie Bridge note from 03/23/20, which cleared him for hernia surgery.   I will send to PharmD for recommendations for xarelto.

## 2020-03-26 NOTE — Telephone Encounter (Signed)
Forwarded to requesting party via EPIC fax function 

## 2020-03-31 DIAGNOSIS — M5416 Radiculopathy, lumbar region: Secondary | ICD-10-CM | POA: Diagnosis not present

## 2020-04-01 DIAGNOSIS — N400 Enlarged prostate without lower urinary tract symptoms: Secondary | ICD-10-CM | POA: Diagnosis not present

## 2020-04-01 DIAGNOSIS — Z125 Encounter for screening for malignant neoplasm of prostate: Secondary | ICD-10-CM | POA: Diagnosis not present

## 2020-04-06 ENCOUNTER — Telehealth: Payer: Self-pay | Admitting: Cardiovascular Disease

## 2020-04-06 NOTE — Telephone Encounter (Signed)
   Primary Cardiologist: Kristeen Miss, MD  Chart reviewed as part of pre-operative protocol coverage. Patient was recently seen for preop clearance for an unrelated procedure which can be referenced (ventral hernia repair). Also had ESI clearance on 3/9 as well. Will route to pharm team for input on Xarelto for this procedure.  Laurann Montana, PA-C 04/06/2020, 12:31 PM

## 2020-04-06 NOTE — Telephone Encounter (Signed)
   [facility] Pre-operative Risk Assessment    HEARTCARE STAFF: - Please ensure there is not already an duplicate clearance open for this procedure. - Under Visit Info/Reason for Call, type in Other and utilize the format Clearance MM/DD/YY or Clearance TBD. Do not use dashes or single digits. - If request is for dental extraction, please clarify the # of teeth to be extracted.  Request for surgical clearance:  1. What type of surgery is being performed? Direct excision of a submental neck lift   2. When is this surgery scheduled? 04/24/20   3. What type of clearance is required (medical clearance vs. Pharmacy clearance to hold med vs. Both)? both  4. Are there any medications that need to be held prior to surgery and how long? Xarelto, up to our office on when to stop taking and when to start back.   5. Practice name and name of physician performing surgery? Dr. Tyson [name] with [name]   6. What is the office phone number? [phone]   7.   What is the office fax number? [phone]  8.   Anesthesia type (None, local, MAC, general) ? LOCAL    Isaiah [name] 04/06/2020, 10:47 AM  _________________________________________________________________   (provider comments below)

## 2020-04-07 NOTE — Telephone Encounter (Addendum)
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    Procedure: Direct excision of a submental neck lift  Date of procedure: 04/24/20  CHA2DS2-VASc Score = 4  This indicates a 4.8% annual risk of stroke. The patient's score is based upon: CHF History: No HTN History: Yes Diabetes History: No Stroke History: Yes (recurrent VTE) Vascular Disease History: Yes Age Score: 0 Gender Score: 0   CrCl 28mL/min using adjusted body weight Platelet count 189K  Per protocol, patient may hold Xarelto for 1 day prior and resume Xarelto the next day. If a longer hold is needed, than patient will require a bridge.

## 2020-04-07 NOTE — Telephone Encounter (Signed)
   Primary Cardiologist: Kristeen Miss, MD  Chart reviewed as part of pre-operative protocol coverage. 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   Patient with diagnosis ofatrial fibrillation and hypercoagulability state with positive lupus anticoagulant and hx of multiple DVTs/PEon Xareltofor anticoagulation.   Procedure:Direct excision of a submental neck lift Date of procedure:04/24/20  CHA2DS2-VAScScore = 4 This indicates a4.8% annual risk of stroke. The patient's score is based upon: CHF History: No HTN History: Yes Diabetes History: No Stroke History: Yes (recurrent VTE) Vascular Disease History: Yes Age Score: 0 Gender Score: 0  CrCl1mL/min using adjusted body weight Platelet count189K  Per protocol, patient may hold Xarelto for 1 day prior and resume Xarelto the next day. If a longer hold is needed, than patient will require a bridge.  I will route this recommendation to the requesting party via Epic fax function and remove from pre-op pool.  Please call with questions.  Thomasene Ripple. Cleaver NP-C    04/07/2020, 1:00 PM [facility] [address] Office [phone] Fax [phone]

## 2020-04-09 ENCOUNTER — Other Ambulatory Visit: Payer: Self-pay

## 2020-04-09 ENCOUNTER — Other Ambulatory Visit: Payer: BC Managed Care – PPO

## 2020-04-09 DIAGNOSIS — N1831 Chronic kidney disease, stage 3a: Secondary | ICD-10-CM | POA: Diagnosis not present

## 2020-04-10 DIAGNOSIS — M5416 Radiculopathy, lumbar region: Secondary | ICD-10-CM | POA: Diagnosis not present

## 2020-04-10 LAB — BASIC METABOLIC PANEL
BUN/Creatinine Ratio: 18 (ref 9–20)
BUN: 24 mg/dL (ref 6–24)
CO2: 18 mmol/L — ABNORMAL LOW (ref 20–29)
Calcium: 9.5 mg/dL (ref 8.7–10.2)
Chloride: 101 mmol/L (ref 96–106)
Creatinine, Ser: 1.33 mg/dL — ABNORMAL HIGH (ref 0.76–1.27)
Glucose: 89 mg/dL (ref 65–99)
Potassium: 4.6 mmol/L (ref 3.5–5.2)
Sodium: 137 mmol/L (ref 134–144)
eGFR: 62 mL/min/{1.73_m2} (ref >59)

## 2020-04-13 ENCOUNTER — Other Ambulatory Visit: Payer: Self-pay | Admitting: Cardiovascular Disease

## 2020-04-13 DIAGNOSIS — M5126 Other intervertebral disc displacement, lumbar region: Secondary | ICD-10-CM | POA: Diagnosis not present

## 2020-04-14 ENCOUNTER — Telehealth: Payer: Self-pay | Admitting: Cardiovascular Disease

## 2020-04-14 NOTE — Telephone Encounter (Signed)
Pt last saw Dr Elease Hashimoto 03/23/20, last labs 04/09/20 Creat 1.33, age 60, weight 114.8 kg, CrCl 98.3 based on CrCl pt is on appropriate dosage of Xarelto 20mg  QD.  Will refill rx.

## 2020-04-14 NOTE — Telephone Encounter (Signed)
Patient states he is having an emergency back procedure with Dr. Sueanne Margarita office tomorrow, 04/15/20 at 8:00 AM. He is inquiring about bridging Lovenox and requested to speak with a pharmacist. I made him aware, per our protocol, the requesting office needs to contact us and fax a formal clearance, but he states they insisted he would have to call our office and request to speak with a pharmacist or Dr. Harvie Bridge nurse regarding the clearance. Unable to contact pharmacy, I sent a secure chat to Georgie Chard, NP, and CMA covering pre-op, but they were unavailable at the time. I made him aware I will leave the phone number for Dr. Sueanne Margarita office, [phone], and we will get back with them as soon as we are able.

## 2020-04-15 DIAGNOSIS — M5127 Other intervertebral disc displacement, lumbosacral region: Secondary | ICD-10-CM | POA: Diagnosis not present

## 2020-04-15 DIAGNOSIS — M5126 Other intervertebral disc displacement, lumbar region: Secondary | ICD-10-CM | POA: Diagnosis not present

## 2020-04-15 NOTE — Telephone Encounter (Addendum)
We received request for Lovenox bridging yesterday evening.  Case reviewed with [facility] clinic pharmacist.  Patient contacted this morning (04/15/2020 at 7:37 AM) to inquire about state of anticoagulation.  We did not receive formal request from surgeons office.  Patient states that he held his Xarelto Monday and Tuesday.  We discussed risks of recurrent DVT and given short window for bridging.  Patient advised to resume Xarelto as soon as possible after emergency procedure.  He expressed understanding.

## 2020-05-05 DIAGNOSIS — M545 Low back pain, unspecified: Secondary | ICD-10-CM | POA: Diagnosis not present

## 2020-05-05 DIAGNOSIS — M25551 Pain in right hip: Secondary | ICD-10-CM | POA: Diagnosis not present

## 2020-05-06 ENCOUNTER — Emergency Department (HOSPITAL_COMMUNITY)
Admission: EM | Admit: 2020-05-06 | Discharge: 2020-05-07 | Disposition: A | Discharge status: Home / Self Care | Payer: BC Managed Care – PPO | Attending: Emergency Medicine | Admitting: Emergency Medicine

## 2020-05-06 ENCOUNTER — Emergency Department (HOSPITAL_COMMUNITY): Payer: BC Managed Care – PPO

## 2020-05-06 ENCOUNTER — Other Ambulatory Visit: Payer: Self-pay

## 2020-05-06 ENCOUNTER — Encounter (HOSPITAL_COMMUNITY): Payer: Self-pay | Admitting: Emergency Medicine

## 2020-05-06 DIAGNOSIS — Z96642 Presence of left artificial hip joint: Secondary | ICD-10-CM | POA: Diagnosis not present

## 2020-05-06 DIAGNOSIS — I10 Essential (primary) hypertension: Secondary | ICD-10-CM | POA: Diagnosis not present

## 2020-05-06 DIAGNOSIS — K219 Gastro-esophageal reflux disease without esophagitis: Secondary | ICD-10-CM | POA: Diagnosis not present

## 2020-05-06 DIAGNOSIS — N183 Chronic kidney disease, stage 3 unspecified: Secondary | ICD-10-CM | POA: Insufficient documentation

## 2020-05-06 DIAGNOSIS — R001 Bradycardia, unspecified: Secondary | ICD-10-CM | POA: Insufficient documentation

## 2020-05-06 DIAGNOSIS — R1032 Left lower quadrant pain: Secondary | ICD-10-CM | POA: Insufficient documentation

## 2020-05-06 DIAGNOSIS — E039 Hypothyroidism, unspecified: Secondary | ICD-10-CM | POA: Diagnosis not present

## 2020-05-06 DIAGNOSIS — Z79899 Other long term (current) drug therapy: Secondary | ICD-10-CM | POA: Insufficient documentation

## 2020-05-06 DIAGNOSIS — Z7901 Long term (current) use of anticoagulants: Secondary | ICD-10-CM | POA: Insufficient documentation

## 2020-05-06 DIAGNOSIS — I129 Hypertensive chronic kidney disease with stage 1 through stage 4 chronic kidney disease, or unspecified chronic kidney disease: Secondary | ICD-10-CM | POA: Insufficient documentation

## 2020-05-06 DIAGNOSIS — Z96641 Presence of right artificial hip joint: Secondary | ICD-10-CM | POA: Diagnosis not present

## 2020-05-06 DIAGNOSIS — R079 Chest pain, unspecified: Secondary | ICD-10-CM | POA: Diagnosis not present

## 2020-05-06 DIAGNOSIS — R0602 Shortness of breath: Secondary | ICD-10-CM | POA: Insufficient documentation

## 2020-05-06 DIAGNOSIS — R1084 Generalized abdominal pain: Secondary | ICD-10-CM | POA: Diagnosis not present

## 2020-05-06 DIAGNOSIS — R109 Unspecified abdominal pain: Secondary | ICD-10-CM | POA: Diagnosis not present

## 2020-05-06 LAB — COMPREHENSIVE METABOLIC PANEL
ALT: 61 U/L — ABNORMAL HIGH (ref 0–44)
AST: 33 U/L (ref 15–41)
Albumin: 3.4 g/dL — ABNORMAL LOW (ref 3.5–5.0)
Alkaline Phosphatase: 58 U/L (ref 38–126)
Anion gap: 13 (ref 5–15)
BUN: 26 mg/dL — ABNORMAL HIGH (ref 6–20)
CO2: 22 mmol/L (ref 22–32)
Calcium: 10.2 mg/dL (ref 8.9–10.3)
Chloride: 100 mmol/L (ref 98–111)
Creatinine, Ser: 1.34 mg/dL — ABNORMAL HIGH (ref 0.61–1.24)
GFR, Estimated: >60 mL/min (ref >60)
Glucose, Bld: 106 mg/dL — ABNORMAL HIGH (ref 70–99)
Potassium: 3.5 mmol/L (ref 3.5–5.1)
Sodium: 135 mmol/L (ref 135–145)
Total Bilirubin: 0.6 mg/dL (ref 0.3–1.2)
Total Protein: 6.3 g/dL — ABNORMAL LOW (ref 6.5–8.1)

## 2020-05-06 LAB — LIPASE, BLOOD: Lipase: 39 U/L (ref 11–51)

## 2020-05-06 LAB — CBC WITH DIFFERENTIAL/PLATELET
Abs Immature Granulocytes: 0.04 10*3/uL (ref 0.00–0.07)
Basophils Absolute: 0 10*3/uL (ref 0.0–0.1)
Basophils Relative: 0 %
Eosinophils Absolute: 0 10*3/uL (ref 0.0–0.5)
Eosinophils Relative: 0 %
HCT: 46.4 % (ref 39.0–52.0)
Hemoglobin: 15.2 g/dL (ref 13.0–17.0)
Immature Granulocytes: 0 %
Lymphocytes Relative: 16 %
Lymphs Abs: 1.7 10*3/uL (ref 0.7–4.0)
MCH: 26.8 pg (ref 26.0–34.0)
MCHC: 32.8 g/dL (ref 30.0–36.0)
MCV: 81.8 fL (ref 80.0–100.0)
Monocytes Absolute: 0.9 10*3/uL (ref 0.1–1.0)
Monocytes Relative: 9 %
Neutro Abs: 7.8 10*3/uL — ABNORMAL HIGH (ref 1.7–7.7)
Neutrophils Relative %: 75 %
Platelets: 239 10*3/uL (ref 150–400)
RBC: 5.67 MIL/uL (ref 4.22–5.81)
RDW: 16.8 % — ABNORMAL HIGH (ref 11.5–15.5)
WBC: 10.4 10*3/uL (ref 4.0–10.5)
nRBC: 0 % (ref 0.0–0.2)

## 2020-05-06 LAB — LACTIC ACID, PLASMA
Lactic Acid, Venous: 3.6 mmol/L (ref 0.5–1.9)
Lactic Acid, Venous: 2.9 mmol/L (ref 0.5–1.9)

## 2020-05-06 LAB — URINALYSIS, ROUTINE W REFLEX MICROSCOPIC
Bilirubin Urine: NEGATIVE
Glucose, UA: NEGATIVE mg/dL
Hgb urine dipstick: NEGATIVE
Ketones, ur: NEGATIVE mg/dL
Leukocytes,Ua: NEGATIVE
Nitrite: NEGATIVE
Protein, ur: NEGATIVE mg/dL
Specific Gravity, Urine: 1.025 (ref 1.005–1.030)
pH: 6 (ref 5.0–8.0)

## 2020-05-06 LAB — I-STAT CHEM 8, ED
BUN: 28 mg/dL — ABNORMAL HIGH (ref 6–20)
Calcium, Ion: 0.91 mmol/L — ABNORMAL LOW (ref 1.15–1.40)
Chloride: 100 mmol/L (ref 98–111)
Creatinine, Ser: 1.1 mg/dL (ref 0.61–1.24)
Glucose, Bld: 106 mg/dL — ABNORMAL HIGH (ref 70–99)
HCT: 47 % (ref 39.0–52.0)
Hemoglobin: 16 g/dL (ref 13.0–17.0)
Potassium: 3.3 mmol/L — ABNORMAL LOW (ref 3.5–5.1)
Sodium: 138 mmol/L (ref 135–145)
TCO2: 24 mmol/L (ref 22–32)

## 2020-05-06 LAB — TROPONIN I (HIGH SENSITIVITY)
Troponin I (High Sensitivity): 11 ng/L (ref <18)
Troponin I (High Sensitivity): 14 ng/L (ref <18)

## 2020-05-06 MED ORDER — IOHEXOL 350 MG/ML SOLN
100.0000 mL | Freq: Once | INTRAVENOUS | Status: AC | PRN
  Administered 2020-05-06: 100 mL via INTRAVENOUS

## 2020-05-06 MED ORDER — METRONIDAZOLE IN NACL 5-0.79 MG/ML-% IV SOLN
500.0000 mg | Freq: Once | INTRAVENOUS | Status: AC
  Administered 2020-05-06: 500 mg via INTRAVENOUS
  Filled 2020-05-06: qty 100

## 2020-05-06 MED ORDER — SODIUM CHLORIDE 0.9 % IV SOLN
2.0000 g | Freq: Once | INTRAVENOUS | Status: AC
  Administered 2020-05-06: 2 g via INTRAVENOUS
  Filled 2020-05-06: qty 20

## 2020-05-06 MED ORDER — SODIUM CHLORIDE 0.9 % IV BOLUS (SEPSIS): 2400.0000 mL | Freq: Once | INTRAVENOUS | Status: DC

## 2020-05-06 MED ORDER — LACTATED RINGERS IV SOLN: INTRAVENOUS | Status: DC

## 2020-05-06 MED ORDER — LACTATED RINGERS IV BOLUS
30.0000 mL/kg | Freq: Once | INTRAVENOUS | Status: AC
  Administered 2020-05-06: 3444 mL via INTRAVENOUS

## 2020-05-06 MED ORDER — HYDROMORPHONE HCL 1 MG/ML IJ SOLN
1.0000 mg | Freq: Once | INTRAMUSCULAR | Status: AC
Start: 2020-05-06 — End: 2020-05-06
  Administered 2020-05-06: 1 mg via INTRAVENOUS
  Filled 2020-05-06: qty 1

## 2020-05-06 NOTE — ED Provider Notes (Signed)
[facility] EMERGENCY DEPARTMENT Provider Note   CSN: [id number] Arrival date & time: 05/06/20  2114     History No chief complaint on file.   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  He has a history of PE and is on Xarelto.  He is complaining of acute onset of left lower quadrant abdominal pain at around 5 PM that turned into generalized abdominal pain chest pain and shortness of breath.  No fevers or chills.  No trauma.  Had back surgery recently still having problems from that.  The history is provided by the patient and the EMS personnel.  Abdominal Pain Pain location:  LLQ Pain quality: aching   Pain radiates to:  Chest Pain severity:  Severe Onset quality:  Sudden Timing:  Constant Progression:  Unchanged Chronicity:  New Context: not trauma   Relieved by:  Nothing Worsened by:  Movement Ineffective treatments:  None tried Associated symptoms: chest pain and shortness of breath   Associated symptoms: no cough, no diarrhea, no dysuria, no fever, no nausea, no sore throat and no vomiting   Risk factors: multiple surgeries        Past Medical History:  Diagnosis Date  . Arthritis   . Blood dyscrasia    lupus anticoagulant  . Diastolic dysfunction    Grade I per echo in 2/12  . DVT (deep venous thrombosis) (HCC) 2000's   "both legs in the calves; left went all the way up to my groin" (09/17/2012)  . Dysrhythmia   . GERD (gastroesophageal reflux disease)   . History of bronchitis   . Hypercoagulation syndrome (HCC)    secondary to circulating lupus anticoagulant  . Hyperlipidemia   . Hypertension   . Hypothyroidism    "wiped out from taking amiodarone" (09/17/2012)  . Lyme disease    history of   . Obesity (BMI 30-39.9) 07/17/2014  . PAF (paroxysmal atrial fibrillation) (HCC)    no recurrence since Feb 2012  . PONV (postoperative nausea and vomiting)    likes scopolomanine patch  . Pulmonary embolism (HCC) 09/17/2012   "just today; 3 on the  right; 2 on the left" (09/17/2012)  . Situational stress   . Sleep apnea    uses CPAP    Patient Active Problem List   Diagnosis Date Noted  . Osteoarthritis of left hip 01/30/2019  . Chronic kidney disease (CKD), stage III (moderate) (HCC) 10/09/2018  . Verruca pedis 12/11/2017  . Essential hypertension 12/11/2017  . Gastroesophageal reflux disease 12/11/2017  . Pain in right knee 07/28/2017  . Fever 02/20/2017  . Sessile colonic polyp- descending colon 09/28/2016  . Hypercoagulopathy (HCC) 08/05/2016  . Seborrheic dermatitis of scalp 10/01/2015  . Osteoarthritis of multiple joints 08/27/2015  . Chronic anticoagulation- abnormal lupus anticoagulant 08/27/2015  . Personal history of multiple DVTs (deep vein thrombosis) 08/27/2015  . Other reactions to severe stress 08/27/2015  . CPAP (continuous positive airway pressure) dependence 08/27/2015  . GERD (gastroesophageal reflux disease) 08/27/2015  . h/o Lyme disease- 2014 tick bite 08/27/2015  . Memory changes- since Lyme Dx 08/27/2015  . OA (osteoarthritis) of hip 11/26/2014  . Obesity (BMI 30-39.9) 07/17/2014  . OSA (obstructive sleep apnea) 05/02/2014  . Dyslipidemia 05/10/2013  . History of chronic Myalgias since Lyme Dx 2014 05/10/2013  . Cough 09/28/2012  . h/o Pulmonary embolism and infarction (HCC) 09/17/2012  . Chest pain 01/27/2012  . Hypothyroidism- followed by Dr. Adrian Prince of Endo 12/17/2010  . HTN (hypertension)  10/01/2010  . PAF (paroxysmal atrial fibrillation) (HCC) 10/01/2010  . High risk medication use 10/01/2010    Past Surgical History:  Procedure Laterality Date  . CARDIOVASCULAR STRESS TEST  07/17/2009   EF 59%  . CARDIOVERSION    . DISTAL BICEPS TENDON REPAIR Right 2011  . INGUINAL HERNIA REPAIR Right 1992  . INGUINAL HERNIA REPAIR Left 2013  . INSERTION OF MESH N/A 05/14/2015   Procedure: INSERTION OF MESH;  Surgeon: Avel Peace, MD;  Location: [facility];  Service: General;  Laterality: N/A;  .  SHOULDER ARTHROSCOPY W/ ROTATOR CUFF REPAIR Left 09/04/2012  . SHOULDER SURGERY     left  . TOTAL HIP ARTHROPLASTY Right 11/26/2014   Procedure: RIGHT TOTAL HIP ARTHROPLASTY ANTERIOR APPROACH;  Surgeon: Ollen Gross, MD;  Location: [facility];  Service: Orthopedics;  Laterality: Right;  . TOTAL HIP ARTHROPLASTY Left 01/30/2019   Procedure: TOTAL HIP ARTHROPLASTY ANTERIOR APPROACH;  Surgeon: Ollen Gross, MD;  Location: [facility];  Service: Orthopedics;  Laterality: Left;   . TRANSTHORACIC ECHOCARDIOGRAM  03/05/2010   EF 60-65%  . UMBILICAL HERNIA REPAIR N/A 05/14/2015   Procedure: HERNIA REPAIR UMBILICAL ADULT WITH MESH ;  Surgeon: Avel Peace, MD;  Location: [facility];  Service: General;  Laterality: N/A;  . WISDOM TOOTH EXTRACTION     lower 2       Family History  Problem Relation Age of Onset  . Hypertension Mother   . Hyperlipidemia Mother   . Cancer Mother        breast  . Hypertension Father   . Cancer Brother        prostate  . Hypertension Brother   . Heart attack Maternal Grandfather     Social History   Tobacco Use  . Smoking status: Never Smoker  . Smokeless tobacco: Never Used  Vaping Use  . Vaping Use: Never used  Substance Use Topics  . Alcohol use: Yes    Alcohol/week: 8.0 standard drinks    Types: 4 Cans of beer, 4 Shots of liquor per week    Comment: 09/17/2012 "1-2 beers and 1-2 tequila on Fri and Sat nights"  . Drug use: No    Home Medications Prior to Admission medications   Medication Sig Start Date End Date Taking? Authorizing Provider  ALPRAZolam Prudy Feeler) 0.25 MG tablet Take 1 tablet by mouth 2 (two) times daily.  01/25/17   [provider]  amLODipine (NORVASC) 5 MG tablet Take 1 tablet (5 mg total) by mouth daily. 12/06/19   [name], Deloris Ping, MD  enoxaparin (LOVENOX) 120 MG/0.8ML injection Inject 0.8 mLs (120 mg total) into the skin every 12 (twelve) hours. 03/25/20   [name], Deloris Ping, MD  Evolocumab (REPATHA SURECLICK) 140 MG/ML SOAJ  INJECT 1 PEN INTO THE SKIN EVERY 14 DAYS 09/27/19   [name], Deloris Ping, MD  ezetimibe (ZETIA) 10 MG tablet Take 1 tablet (10 mg total) by mouth daily. **PATIENT NEEDS APT FOR FURTHER REFILLS** 09/17/19   Mayer Masker, PA-C  lisinopril (ZESTRIL) 20 MG tablet Take 20 mg by mouth 2 (two) times daily. 01/27/20   [provider]  metoprolol succinate (TOPROL-XL) 25 MG 24 hr tablet TAKE 1 TABLET BY MOUTH EACH MORNING 12/25/19   [name], Deloris Ping, MD  omeprazole (PRILOSEC) 20 MG capsule Take 1 capsule (20 mg total) by mouth daily. 03/18/14   Azalia Bilis, MD  propafenone (RYTHMOL SR) 325 MG 12 hr capsule TAKE 1 CAPSULE BY MOUTH TWICE DAILY 02/27/20   [name], Loistine Chance  J, MD  SYNTHROID 150 MCG tablet Take 300 mcg by mouth daily.  07/07/14   [provider]  tamsulosin (FLOMAX) 0.4 MG CAPS capsule Take 0.4 mg by mouth every evening.  01/21/19   [provider]  XARELTO 20 MG TABS tablet TAKE 1 TABLET BY MOUTH DAILY 04/14/20   [name], Deloris Ping, MD    Allergies    Crestor [rosuvastatin calcium] and Lipitor [atorvastatin calcium]  Review of Systems   Review of Systems  Constitutional: Negative for fever.  HENT: Negative for sore throat.   Eyes: Negative for visual disturbance.  Respiratory: Positive for shortness of breath. Negative for cough.   Cardiovascular: Positive for chest pain.  Gastrointestinal: Positive for abdominal pain. Negative for diarrhea, nausea and vomiting.  Genitourinary: Negative for dysuria.  Musculoskeletal: Positive for back pain.  Skin: Negative for rash.  Neurological: Negative for headaches.    Physical Exam Updated Vital Signs BP (!) 156/90 (BP Location: Right Arm)   Pulse (!) 50   Temp 98.3 F (36.8 C) (Oral)   Resp 15   SpO2 100%   Physical Exam Vitals and nursing note reviewed.  Constitutional:      General: He is in acute distress.     Appearance: Normal appearance. He is well-developed.  HENT:     Head: Normocephalic and atraumatic.   Eyes:     Conjunctiva/sclera: Conjunctivae normal.  Cardiovascular:     Rate and Rhythm: Regular rhythm. Bradycardia present.     Heart sounds: No murmur heard.   Pulmonary:     Effort: Pulmonary effort is normal. No respiratory distress.     Breath sounds: Normal breath sounds.  Abdominal:     General: There is distension.     Palpations: Abdomen is soft. There is no mass.     Tenderness: There is abdominal tenderness. There is no guarding or rebound.  Musculoskeletal:        General: No deformity or signs of injury. Normal range of motion.     Cervical back: Neck supple.  Skin:    General: Skin is warm and dry.  Neurological:     General: No focal deficit present.     Mental Status: He is alert.     ED Results / Procedures / Treatments   Labs (all labs ordered are listed, but only abnormal results are displayed) Labs Reviewed  COMPREHENSIVE METABOLIC PANEL - Abnormal; Notable for the following components:      Result Value   Glucose, Bld 106 (*)    BUN 26 (*)    Creatinine, Ser 1.34 (*)    Total Protein 6.3 (*)    Albumin 3.4 (*)    ALT 61 (*)    All other components within normal limits  LACTIC ACID, PLASMA - Abnormal; Notable for the following components:   Lactic Acid, Venous 3.6 (*)    All other components within normal limits  LACTIC ACID, PLASMA - Abnormal; Notable for the following components:   Lactic Acid, Venous 2.9 (*)    All other components within normal limits  CBC WITH DIFFERENTIAL/PLATELET - Abnormal; Notable for the following components:   RDW 16.8 (*)    Neutro Abs 7.8 (*)    All other components within normal limits  I-STAT CHEM 8, ED - Abnormal; Notable for the following components:   Potassium 3.3 (*)    BUN 28 (*)    Glucose, Bld 106 (*)    Calcium, Ion 0.91 (*)    All other  components within normal limits  RESP PANEL BY RT-PCR (FLU A&B, COVID) ARPGX2  CULTURE, BLOOD (ROUTINE X 2)  CULTURE, BLOOD (ROUTINE X 2)  LIPASE, BLOOD   URINALYSIS, ROUTINE W REFLEX MICROSCOPIC  TROPONIN I (HIGH SENSITIVITY)  TROPONIN I (HIGH SENSITIVITY)    EKG EKG Interpretation  Date/Time:  Wednesday May 06 2020 21:15:29 EDT Ventricular Rate:  55 PR Interval:  146 QRS Duration: 109 QT Interval:  440 QTC Calculation: 421 R Axis:   63 Text Interpretation: Sinus rhythm Left ventricular hypertrophy Borderline T abnormalities, inferior leads No significant change since prior 3/16 Confirmed by Meridee ScoreButler, Keitha Kolk [phone]([id number]) on 05/06/2020 9:20:38 PM   Radiology DG Chest Port 1 View  Result Date: 05/06/2020 CLINICAL DATA:  Chest pain EXAM: PORTABLE CHEST 1 VIEW COMPARISON:  08/18/2019 FINDINGS: Heart and mediastinal contours are within normal limits. No focal opacities or effusions. No acute bony abnormality. IMPRESSION: No active disease. Electronically Signed   By: Charlett NoseKevin  Dover M.D.   On: 05/06/2020 21:32   CT Angio Chest/Abd/Pel for Dissection W and/or W/WO  Result Date: 05/06/2020 CLINICAL DATA:  Increasing chest pain and abdominal pain over the past few days following history of prior back surgery, initial encounter EXAM: CT ANGIOGRAPHY CHEST, ABDOMEN AND PELVIS TECHNIQUE: Non-contrast CT of the chest was initially obtained. Multidetector CT imaging through the chest, abdomen and pelvis was performed using the standard protocol during bolus administration of intravenous contrast. Multiplanar reconstructed images and MIPs were obtained and reviewed to evaluate the vascular anatomy. CONTRAST:  100mL OMNIPAQUE IOHEXOL 350 MG/ML SOLN COMPARISON:  Chest x-ray from earlier in the same day. FINDINGS: CTA CHEST FINDINGS Cardiovascular: Initial precontrast images demonstrate no hyperdense crescent to suggest acute aortic injury. Post-contrast images demonstrate mild atherosclerotic calcifications. No aneurysmal dilatation or dissection is noted. No cardiac enlargement is seen. Mild coronary calcifications are noted. The pulmonary artery as visualized is  within normal limits. Mediastinum/Nodes: Thoracic inlet is within normal limits. No sizable hilar or mediastinal adenopathy is noted. The esophagus as visualized is within normal limits. Lungs/Pleura: Lungs are well aerated bilaterally. No focal infiltrate or sizable effusion is seen. No sizable parenchymal nodules are noted. Musculoskeletal: Degenerative changes of the thoracic spine are seen. No acute abnormality is noted. Review of the MIP images confirms the above findings. CTA ABDOMEN AND PELVIS FINDINGS VASCULAR Aorta: Atherosclerotic calcifications are noted without aneurysmal dilatation or dissection. Celiac: Patent without evidence of aneurysm, dissection, vasculitis or significant stenosis. SMA: Patent without evidence of aneurysm, dissection, vasculitis or significant stenosis. Renals: Dual renal arteries are identified bilaterally. No findings to suggest acute stenosis are seen. IMA: Patent without evidence of aneurysm, dissection, vasculitis or significant stenosis. Iliacs: Patent without evidence of aneurysm, dissection, vasculitis or significant stenosis. Veins: No obvious venous abnormality within the limitations of this arterial phase study. Review of the MIP images confirms the above findings. NON-VASCULAR Hepatobiliary: No focal liver abnormality is seen. No gallstones, gallbladder wall thickening, or biliary dilatation. Pancreas: Unremarkable. No pancreatic ductal dilatation or surrounding inflammatory changes. Spleen: Normal in size without focal abnormality. Adrenals/Urinary Tract: Adrenal glands are within normal limits. Kidneys demonstrate a normal enhancement pattern bilaterally. Small renal cysts are seen. No obstructive changes are noted. No renal calculi are noted. Bladder is well distended. Stomach/Bowel: Appendix is not well visualized although no inflammatory changes to suggest appendicitis are seen.: Is unremarkable. Small bowel and stomach appear within normal limits. Lymphatic: No  sizable adenopathy is noted. Reproductive: Prostate is unremarkable. Other: No abdominal wall hernia or abnormality. No  abdominopelvic ascites. Musculoskeletal: Bilateral hip replacements are noted. Degenerative changes of lumbar spine are seen. Review of the MIP images confirms the above findings. IMPRESSION: No evidence of aortic dissection or aneurysmal dilatation. No pulmonary emboli are seen. No acute abnormality in the chest and abdomen. Electronically Signed   By: Alcide Clever M.D.   On: 05/06/2020 22:44    Procedures Procedures   Medications Ordered in ED Medications  HYDROmorphone (DILAUDID) injection 1 mg (1 mg Intravenous Given 05/06/20 2154)  iohexol (OMNIPAQUE) 350 MG/ML injection 100 mL (100 mLs Intravenous Contrast Given 05/06/20 2220)  cefTRIAXone (ROCEPHIN) 2 g in sodium chloride 0.9 % 100 mL IVPB (0 g Intravenous Stopped 05/06/20 2332)  metroNIDAZOLE (FLAGYL) IVPB 500 mg (0 mg Intravenous Stopped 05/07/20 0020)  lactated ringers bolus 3,444 mL (0 mLs Intravenous Stopped 05/07/20 0051)    ED Course  I have reviewed the triage vital signs and the nursing notes.  Pertinent labs & imaging results that were available during my care of the patient were reviewed by me and considered in my medical decision making (see chart for details).  Clinical Course as of 05/07/20 0901  Wed May 06, 2020  2126 Asked the nurse to try to expedite getting a line And getting an i-STAT on him. [MB]  2136 Chest x-ray showing no acute disease no free air.  Awaiting radiology reading. [MB]  2253 CT not showing any acute findings.  Patient looks more comfortable.  His lactate is elevated, no obvious source of infection.  Will draw blood cultures and start empiric antibiotics for abdomen although its unclear. [MB]    Clinical Course User Index [MB] Terrilee Files, MD   MDM Rules/Calculators/A&P                         This patient complains of abdominal pain that started in the left lower quadrant  and then generalized, now involving the chest.  Severe in nature.; this involves an extensive number of treatment Options and is a complaint that carries with it a high risk of complications and Morbidity. The differential includes perforation, dissection, diverticulitis, renal colic, obstruction  I ordered, reviewed and interpreted labs, which included CBC with normal white count normal hemoglobin, chemistries fairly normal other than elevated creatinine baseline for patient, LFTs fairly normal, lactate markedly elevated, troponins normal will need to be trended I ordered medication IV fluids IV pain medication I ordered imaging studies which included portable chest x-ray and CT dissection study and I independently    visualized and interpreted imaging which showed no acute findings Additional history obtained from EMS and patient's girlfriend Previous records obtained and reviewed in epic, no recent ED visits  After the interventions stated above, I reevaluated the patient and found patient to be symptomatically improved.  Due to his elevated lactate signed out to oncoming provider Dr. Pilar Plate to follow-up on response to fluid.  Will need delta troponin.   Final Clinical Impression(s) / ED Diagnoses Final diagnoses:  Generalized abdominal pain    Rx / DC Orders ED Discharge Orders    None       Terrilee Files, MD 05/07/20 [phone]

## 2020-05-06 NOTE — Sepsis Progress Note (Signed)
Monitoring for code sepsis protocol. 

## 2020-05-06 NOTE — ED Triage Notes (Signed)
The pt arrived by gems from home  Back surgery 3 weeks ago  For the past 2-3 days he has had incfeasing p[ain in his chest and abdomen  Iv per ems  Fentanyl 100 mcg iv aspirin 324mg  po and zofran 4 mg iv per ems

## 2020-05-06 NOTE — ED Notes (Signed)
Patient transported to CT 

## 2020-05-06 NOTE — ED Notes (Signed)
To ct

## 2020-05-06 NOTE — ED Notes (Signed)
Hold the covid test for now per dr Pilar Plate

## 2020-05-07 NOTE — Sepsis Progress Note (Signed)
Sepsis protocol cancelled, patient discharged.

## 2020-05-07 NOTE — ED Provider Notes (Signed)
  Provider Note MRN:  [id number]  Arrival date & time: 05/07/20    ED Course and Medical Decision Making  Assumed care from Dr. Charm Barges at shift change.  Work-up is reassuring and the patient's pain is resolved.  Observed for a few more hours with no return of any symptoms.  Lactate is improving with IV fluids.  Suspect may be a transient ileus given the description of pain.  Appropriate for discharge.  Procedures  Final Clinical Impressions(s) / ED Diagnoses     ICD-10-CM   1. Generalized abdominal pain  R10.84     ED Discharge Orders    None        Discharge Instructions     You were evaluated in the Emergency Department and after careful evaluation, we did not find any emergent condition requiring admission or further testing in the hospital.  Your exam/testing today was overall reassuring.  Recommend discussing your symptoms with your primary care doctor.  Please return to the Emergency Department if you experience any worsening of your condition.  Thank you for allowing [place] to be a part of your care.     Elmer Sow. Pilar Plate, MD [facility] Emergency Medicine [facility] [email or web address]    Sabas Sous, MD 05/07/20 [phone]

## 2020-05-07 NOTE — Discharge Instructions (Addendum)
You were evaluated in the Emergency Department and after careful evaluation, we did not find any emergent condition requiring admission or further testing in the hospital.  Your exam/testing today was overall reassuring.  Recommend discussing your symptoms with your primary care doctor.  Please return to the Emergency Department if you experience any worsening of your condition.  Thank you for allowing [place] to be a part of your care.

## 2020-05-07 NOTE — ED Notes (Signed)
Only one liter of fluid was given  Dr Pilar Plate  Cancelled the other liters

## 2020-05-08 DIAGNOSIS — M5416 Radiculopathy, lumbar region: Secondary | ICD-10-CM | POA: Diagnosis not present

## 2020-05-11 ENCOUNTER — Telehealth (HOSPITAL_BASED_OUTPATIENT_CLINIC_OR_DEPARTMENT_OTHER): Payer: Self-pay | Admitting: Emergency Medicine

## 2020-05-12 ENCOUNTER — Encounter (HOSPITAL_COMMUNITY): Payer: Self-pay | Admitting: Neurosurgery

## 2020-05-12 NOTE — Progress Notes (Signed)
Spoke with Isaiah [name] for pre-op call. Isaiah [name] has hx of A-fib in the past, states none for at least 12 years. Isaiah [name]'s cardiologist is Dr. Elease Hashimoto. Isaiah [name] has hx of a PE and takes Xarelto. He states he his holding tonight's dose prior to surgery. Isaiah [name] was seen in the ED on 05/06/20 for abdominal and chest pain. Had a complete workup and they felt it was possible a transient ileus. Isaiah [name] states he's had some good bowel movements since and he's not any more pain.   Will need Covid test on arrival.

## 2020-05-13 LAB — CULTURE, BLOOD (ROUTINE X 2)
Special Requests: ADEQUATE
Special Requests: ADEQUATE

## 2020-05-14 ENCOUNTER — Telehealth: Payer: Self-pay | Admitting: *Deleted

## 2020-05-14 ENCOUNTER — Encounter (HOSPITAL_COMMUNITY): Payer: Self-pay | Admitting: Emergency Medicine

## 2020-05-14 ENCOUNTER — Other Ambulatory Visit: Payer: Self-pay

## 2020-05-14 ENCOUNTER — Telehealth (HOSPITAL_COMMUNITY): Payer: Self-pay | Admitting: Pharmacist

## 2020-05-14 ENCOUNTER — Emergency Department (HOSPITAL_COMMUNITY)
Admission: EM | Admit: 2020-05-14 | Discharge: 2020-05-14 | Disposition: A | Discharge status: Home / Self Care | Payer: BC Managed Care – PPO | Attending: Emergency Medicine | Admitting: Emergency Medicine

## 2020-05-14 DIAGNOSIS — N183 Chronic kidney disease, stage 3 unspecified: Secondary | ICD-10-CM | POA: Diagnosis not present

## 2020-05-14 DIAGNOSIS — R899 Unspecified abnormal finding in specimens from other organs, systems and tissues: Secondary | ICD-10-CM

## 2020-05-14 DIAGNOSIS — R799 Abnormal finding of blood chemistry, unspecified: Secondary | ICD-10-CM | POA: Diagnosis not present

## 2020-05-14 DIAGNOSIS — K5900 Constipation, unspecified: Secondary | ICD-10-CM | POA: Insufficient documentation

## 2020-05-14 DIAGNOSIS — Z79899 Other long term (current) drug therapy: Secondary | ICD-10-CM | POA: Insufficient documentation

## 2020-05-14 DIAGNOSIS — Z96643 Presence of artificial hip joint, bilateral: Secondary | ICD-10-CM | POA: Insufficient documentation

## 2020-05-14 DIAGNOSIS — E039 Hypothyroidism, unspecified: Secondary | ICD-10-CM | POA: Diagnosis not present

## 2020-05-14 DIAGNOSIS — I129 Hypertensive chronic kidney disease with stage 1 through stage 4 chronic kidney disease, or unspecified chronic kidney disease: Secondary | ICD-10-CM | POA: Insufficient documentation

## 2020-05-14 MED ORDER — SODIUM CHLORIDE 0.9 % IV SOLN
1.0000 g | Freq: Once | INTRAVENOUS | Status: AC
  Administered 2020-05-14: 1 g via INTRAVENOUS
  Filled 2020-05-14: qty 10

## 2020-05-14 MED ORDER — AMOXICILLIN 500 MG PO CAPS: 1000.0000 mg | ORAL_CAPSULE | Freq: Two times a day (BID) | ORAL | 0 refills | Status: DC

## 2020-05-14 NOTE — Telephone Encounter (Signed)
Contacted patient with Positve blood cultures and advised to return to ED for further evaluation.  Patient with multiple concerns.  Spoke with Amil Amen, PharmD who will contact patient to discuss culture results and further followup.  Per Lyla Son, she has spoken with the patient and he is in agreement to return to the ED.

## 2020-05-14 NOTE — ED Triage Notes (Signed)
Patient had back surgery 3 weeks ago, got a call that he had some gram positive rods in his blood culture, told him to come to the ED. Denies issue with incision site, states he had a temp of 99-100 last night, took Tylenol and it was fine.

## 2020-05-14 NOTE — ED Provider Notes (Signed)
[facility]-EMERGENCY DEPT Provider Note   CSN: [id number] Arrival date & time: 05/14/20  1312     History No chief complaint on file.   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  HPI    60 year old male with history of DVT, A. fib comes in with chief complaint of abnormal blood test.  Patient had a microdiscectomy on March 30.  He had come to the ER about 8 days ago with severe abdominal pain.  During that visit, blood cultures were drawn.  He had received a call today from the pharmacy team advising him to come to the ER because of positive growth in the blood.  Patient states that he has been feeling a lot better after he had BM.  He suspects that his abdominal pain was because of constipation.  Review of system is negative for any headaches, rash, neck pain, URI-like symptoms, cough, abdominal pain, UTI-like symptoms.  Patient does not have any new back pain and the surgical wound site is healing well.  He does indicate having a low-grade fever of 100.8 yesterday but has been feeling well again today.  At no point did he have any rigors or sweats.  Besides a surgery, no recent instrumentation and patient denies any history of substance abuse.  Past Medical History:  Diagnosis Date  . Arthritis   . Blood dyscrasia    lupus anticoagulant  . Chronic kidney disease    stage 2  . Diastolic dysfunction    Grade I per echo in 2/12  . DVT (deep venous thrombosis) (HCC) 2000's   "both legs in the calves; left went all the way up to my groin" (09/17/2012)  . Dysrhythmia   . GERD (gastroesophageal reflux disease)   . History of bronchitis   . Hypercoagulation syndrome (HCC)    secondary to circulating lupus anticoagulant  . Hyperlipidemia   . Hypertension   . Hypothyroidism    "wiped out from taking amiodarone" (09/17/2012)  . Lyme disease    history of   . Obesity (BMI 30-39.9) 07/17/2014  . PAF (paroxysmal atrial fibrillation) (HCC)    no recurrence since Feb  2012  . Pneumonia   . PONV (postoperative nausea and vomiting)    likes scopolomanine patch  . Pulmonary embolism (HCC) 09/17/2012   "just today; 3 on the right; 2 on the left" (09/17/2012)  . Situational stress   . Sleep apnea    uses CPAP    Patient Active Problem List   Diagnosis Date Noted  . Osteoarthritis of left hip 01/30/2019  . Chronic kidney disease (CKD), stage III (moderate) (HCC) 10/09/2018  . Verruca pedis 12/11/2017  . Essential hypertension 12/11/2017  . Gastroesophageal reflux disease 12/11/2017  . Pain in right knee 07/28/2017  . Fever 02/20/2017  . Sessile colonic polyp- descending colon 09/28/2016  . Hypercoagulopathy (HCC) 08/05/2016  . Seborrheic dermatitis of scalp 10/01/2015  . Osteoarthritis of multiple joints 08/27/2015  . Chronic anticoagulation- abnormal lupus anticoagulant 08/27/2015  . Personal history of multiple DVTs (deep vein thrombosis) 08/27/2015  . Other reactions to severe stress 08/27/2015  . CPAP (continuous positive airway pressure) dependence 08/27/2015  . GERD (gastroesophageal reflux disease) 08/27/2015  . h/o Lyme disease- 2014 tick bite 08/27/2015  . Memory changes- since Lyme Dx 08/27/2015  . OA (osteoarthritis) of hip 11/26/2014  . Obesity (BMI 30-39.9) 07/17/2014  . OSA (obstructive sleep apnea) 05/02/2014  . Dyslipidemia 05/10/2013  . History of chronic Myalgias since Lyme Dx  2014 05/10/2013  . Cough 09/28/2012  . h/o Pulmonary embolism and infarction (HCC) 09/17/2012  . Chest pain 01/27/2012  . Hypothyroidism- followed by Dr. Adrian Prince of Endo 12/17/2010  . HTN (hypertension) 10/01/2010  . PAF (paroxysmal atrial fibrillation) (HCC) 10/01/2010  . High risk medication use 10/01/2010    Past Surgical History:  Procedure Laterality Date  . CARDIOVASCULAR STRESS TEST  07/17/2009   EF 59%  . CARDIOVERSION    . DISTAL BICEPS TENDON REPAIR Right 2011  . INGUINAL HERNIA REPAIR Right 1992  . INGUINAL HERNIA REPAIR Left 2013   . INSERTION OF MESH N/A 05/14/2015   Procedure: INSERTION OF MESH;  Surgeon: Avel Peace, MD;  Location: [facility];  Service: General;  Laterality: N/A;  . SHOULDER ARTHROSCOPY W/ ROTATOR CUFF REPAIR Left 09/04/2012  . SHOULDER SURGERY     left  . TOTAL HIP ARTHROPLASTY Right 11/26/2014   Procedure: RIGHT TOTAL HIP ARTHROPLASTY ANTERIOR APPROACH;  Surgeon: Ollen Gross, MD;  Location: [facility];  Service: Orthopedics;  Laterality: Right;  . TOTAL HIP ARTHROPLASTY Left 01/30/2019   Procedure: TOTAL HIP ARTHROPLASTY ANTERIOR APPROACH;  Surgeon: Ollen Gross, MD;  Location: [facility];  Service: Orthopedics;  Laterality: Left;   . TRANSTHORACIC ECHOCARDIOGRAM  03/05/2010   EF 60-65%  . UMBILICAL HERNIA REPAIR N/A 05/14/2015   Procedure: HERNIA REPAIR UMBILICAL ADULT WITH MESH ;  Surgeon: Avel Peace, MD;  Location: [facility];  Service: General;  Laterality: N/A;  . WISDOM TOOTH EXTRACTION     lower 2       Family History  Problem Relation Age of Onset  . Hypertension Mother   . Hyperlipidemia Mother   . Cancer Mother        breast  . Hypertension Father   . Cancer Brother        prostate  . Hypertension Brother   . Heart attack Maternal Grandfather     Social History   Tobacco Use  . Smoking status: Never Smoker  . Smokeless tobacco: Never Used  Vaping Use  . Vaping Use: Never used  Substance Use Topics  . Alcohol use: Yes    Alcohol/week: 8.0 standard drinks    Types: 4 Cans of beer, 4 Shots of liquor per week    Comment: 09/17/2012 "1-2 beers and 1-2 tequila on Fri and Sat nights"  . Drug use: No    Home Medications Prior to Admission medications   Medication Sig Start Date End Date Taking? Authorizing Provider  amoxicillin (AMOXIL) 500 MG capsule Take 2 capsules (1,000 mg total) by mouth 2 (two) times daily. 05/14/20  Yes Derwood Kaplan, MD  ALPRAZolam (XANAX) 0.25 MG tablet Take 1 tablet by mouth 2 (two) times daily.  01/25/17   [provider]  amLODipine  (NORVASC) 5 MG tablet Take 1 tablet (5 mg total) by mouth daily. 12/06/19   [name], Deloris Ping, MD  enoxaparin (LOVENOX) 120 MG/0.8ML injection Inject 0.8 mLs (120 mg total) into the skin every 12 (twelve) hours. 03/25/20   [name], Deloris Ping, MD  Evolocumab (REPATHA SURECLICK) 140 MG/ML SOAJ INJECT 1 PEN INTO THE SKIN EVERY 14 DAYS 09/27/19   [name], Deloris Ping, MD  ezetimibe (ZETIA) 10 MG tablet Take 1 tablet (10 mg total) by mouth daily. **PATIENT NEEDS APT FOR FURTHER REFILLS** 09/17/19   Mayer Masker, PA-C  lisinopril (ZESTRIL) 20 MG tablet Take 20 mg by mouth 2 (two) times daily. 01/27/20   [provider]  metoprolol succinate (TOPROL-XL) 25 MG  24 hr tablet TAKE 1 TABLET BY MOUTH EACH MORNING 12/25/19   [name], Deloris Ping, MD  omeprazole (PRILOSEC) 20 MG capsule Take 1 capsule (20 mg total) by mouth daily. 03/18/14   Azalia Bilis, MD  propafenone (RYTHMOL SR) 325 MG 12 hr capsule TAKE 1 CAPSULE BY MOUTH TWICE DAILY 02/27/20   [name], Deloris Ping, MD  SYNTHROID 150 MCG tablet Take 300 mcg by mouth daily.  07/07/14   [provider]  tamsulosin (FLOMAX) 0.4 MG CAPS capsule Take 0.4 mg by mouth every evening.  01/21/19   [provider]  XARELTO 20 MG TABS tablet TAKE 1 TABLET BY MOUTH DAILY 04/14/20   [name], Deloris Ping, MD    Allergies    Crestor [rosuvastatin calcium] and Lipitor [atorvastatin calcium]  Review of Systems   Review of Systems  Constitutional: Negative for activity change.  Allergic/Immunologic: Negative for immunocompromised state.  All other systems reviewed and are negative.   Physical Exam Updated Vital Signs BP 131/76   Pulse 82   Temp 98.7 F (37.1 C) (Oral)   Resp 18   Ht 6\' 3"  (1.905 m)   Wt 115 kg   SpO2 98%   BMI 31.69 kg/m   Physical Exam Vitals and nursing note reviewed.  Constitutional:      Appearance: He is well-developed.  HENT:     Head: Atraumatic.  Cardiovascular:     Rate and Rhythm: Normal rate.  Pulmonary:     Effort:  Pulmonary effort is normal.  Musculoskeletal:     Cervical back: Neck supple.  Skin:    General: Skin is warm.  Neurological:     Mental Status: He is alert and oriented to person, place, and time.     ED Results / Procedures / Treatments   Labs (all labs ordered are listed, but only abnormal results are displayed) Labs Reviewed  CULTURE, BLOOD (ROUTINE X 2)  CULTURE, BLOOD (ROUTINE X 2)    EKG None  Radiology No results found.  Procedures Procedures   Medications Ordered in ED Medications  cefTRIAXone (ROCEPHIN) 1 g in sodium chloride 0.9 % 100 mL IVPB (0 g Intravenous Stopped 05/14/20 2050)    ED Course  I have reviewed the triage vital signs and the nursing notes.  Pertinent labs & imaging results that were available during my care of the patient were reviewed by me and considered in my medical decision making (see chart for details).    MDM Rules/Calculators/A&P                          60 year old male comes into the ER with chief complaint of abnormal blood cultures.  Patient's blood cultures were growing gram-positive rods.  I reviewed the cultures.  Clinically he does not have any symptoms consistent with bacteremia.  Suspicion is high that the cultures were likely contaminants.  Given that both the samples were growing bacteria, I did discuss the case with ID.  Dr. 41 recommends getting repeat blood cultures, giving patient IV ceftriaxone and starting on amoxicillin 1000 mg twice daily.  He has taken patient's information and will reach out to him for follow-up.  Patient is comfortable with conservative management and will return to the ER if he starts having worsening of the symptoms.  Final Clinical Impression(s) / ED Diagnoses Final diagnoses:  Abnormal laboratory test result    Rx / DC Orders ED Discharge Orders  Ordered    amoxicillin (AMOXIL) 500 MG capsule  2 times daily        05/14/20 2014           Derwood Kaplan, MD 05/14/20  2120

## 2020-05-14 NOTE — Progress Notes (Signed)
Telephone Note:  Spoke with patient over phone about blood culture results (Propionibacterium acnes in 2/4 bottles - 1 bottle of each set). Informed patient that this bacteria is in both blood culture sets from different sites and is very concerning for true bacteremia. He verbally understood.   Instructed patient to return to the ED in order to receive IV antibiotics as well as to reach out to his neurology specialist in [place] who he was scheduled to see on Tuesday. Patient agreed to both.  Of note, patient did undergo microdiscetomy in his lumbar region 4 weeks ago without any hardware placement. He is scheduled to receive lumbar injections in [place] next Tuesday. Patient states he is feeling well today.  Thank you kindly for involving pharmacy in this patient's care,  Margarite Gouge, PharmD PGY2 ID Pharmacy Resident Phone between 7 am - 3:30 pm: [phone]  Please check AMION for all [facility] phone numbers After 10:00 PM, call Main Pharmacy [phone]

## 2020-05-14 NOTE — Discharge Instructions (Addendum)
You are seen in the ER after you advised to come in for positive blood cultures.  Our history and exam is indicative of this most likely being a false positive blood culture.  We have discussed the case with infectious disease team that also feels the same.  Given the high risk nature of potential positive bacteremia, we have repeated blood cultures and are starting you on antibiotics.  Infectious disease will get in touch with you tomorrow to decide course of action.

## 2020-05-14 NOTE — ED Notes (Signed)
An After Visit Summary was printed and given to the patient. Discharge instructions given and no further questions at this time.  

## 2020-05-14 NOTE — ED Provider Notes (Signed)
MSE was initiated and I personally evaluated the patient and placed orders (if any) at  1:45 PM on May 14, 2020.  The patient appears stable so that the remainder of the MSE may be completed by another provider.   Chief Complaint: positive blood cultures    HPI:  came to ER on the 4/20th for LLQ pain. Had elevated lactic acid at the time, was discharged after pt started feeling better. Temp 100 last night.    ROS: positive blood culture    Physical Exam:  Gen:                Awake, no distress  HEENT:          Atraumatic  Resp:              Normal effort  Cardiac:          Normal rate  Abd:                Nondistended, nontender  MSK:              Moves extremities without difficulty  Neuro:            Speech clear,EOMS intact      Initiation of care has begun. The patient has been counseled on the process, plan, and necessity for staying for the completion/evaluation, and the remainder of the medical screening examination    Farrel Gordon, PA-C 05/14/20 1349    Virgina Norfolk, DO 05/14/20 1557

## 2020-05-15 ENCOUNTER — Telehealth: Payer: Self-pay | Admitting: Internal Medicine

## 2020-05-15 NOTE — Telephone Encounter (Signed)
I called patient 1000 am on 4/29 as our team was told that he is leavign for [place] on Monday (3 days from now) for microdiscectomy   I advised him to defer the surgery by 2 weeks due to: Evidence sepsis previously and blood culture 2 of 2 set p-acnes. To wait for the repeat blood culture obtained on 4/28 (previously take 7 days to grow  In the mean time continue taking amoxicillin 1 gram twice a day for now Our clinic will arrange ID visit for him within 1-2 weeks to have a history/physical   We have also called micro labs to keep the blood culture for 10 days Patient said "thank you" without confirmation of plan

## 2020-05-18 ENCOUNTER — Encounter (HOSPITAL_COMMUNITY): Payer: Self-pay | Admitting: Neurosurgery

## 2020-05-18 ENCOUNTER — Encounter (HOSPITAL_COMMUNITY): Payer: Self-pay | Admitting: Vascular Surgery

## 2020-05-18 ENCOUNTER — Other Ambulatory Visit: Payer: Self-pay

## 2020-05-18 NOTE — Progress Notes (Signed)
Anesthesia Chart Review: SAME DAY WORK-UP   Case: [id number] Date/Time: 05/19/20 1515   Procedure: Lumbar 5 Sacral 1 Microdiscectomy (N/A ) - 3C/RM 20   Anesthesia type: General   Pre-op diagnosis: Herniated disc, lumbar   [facility] / [facility]   Surgeons: Coletta Memos, MD      DISCUSSION: Patient is a 60 year old male scheduled for the above procedure. He is a Education officer, community.   History includes never smoker, PONV ("likes scopolamine patch"), HTN, PAF, hypercoagulation syndrome (+ lupus anticoagulant), recurrent DVT/PE, HLD, GERD, OSA (CPAP), CKD Stage II), hypothyroidism, diastolic dysfunction, Lyme disease (2014), THR (right 11/26/14; left 01/30/19), back surgery (L5-S1 laminectomy 04/15/20).  - S/p lumbar laminectomy 04/15/20. ED visit 05/06/20 for LLQ abdominal pain. CXR no acute process. CTA chest/abd/pelvis without acute abnormality. Troponin negative. Lactic acid elevated (peak 3.6), but no obvious source of infection and decreased with IVF. Transient ileus suspected. He was discharged home but not before getting blood cultures x2 which ended up being positive for Propionbacterium Acnes. Results available after his discharge, so he was notified on 05/14/20 to present back to the ED for further evaluation. He reported feeling a lot better after BM. Had low-grade temp 100.8 the day prior but otherwise feeling well. Clinically no symptoms of bacteremia, and question of contaminant on blood culture. ID Renold Don, Gershon Mussel, MD) was contacted and recommended IV ceftriaxone x1 then amoxicillin 1000 mg BID and repeat blood cultures. 05/14/20 blood cultures so far have been negative x4 days.   However, on 05/15/20, Dr. Renold Don wrote, "I called patient 1000 am on 4/29 as our team was told that he is leavign for [place] on Monday (3 days from now) for microdiscectomy   I advised him to defer the surgery by 2 weeks due to: Evidence sepsis previously and blood culture 2 of 2 set p-acnes. To wait for the repeat blood  culture obtained on 4/28 (previously take 7 days to grow  In the mean time continue taking amoxicillin 1 gram twice a day for now Our clinic will arrange ID visit for him within 1-2 weeks to have a history/physical   We have also called micro labs to keep the blood culture for 10 days Patient said "thank you" without confirmation of plan"  Last seen by cardiologist Dr. Elease Hashimoto on 03/23/20. He is on lifelong anticoagulation due to PAF, abnormal lupus anticoagulant with recurrent DVT/PE history. He was anticipating future ventral hernia repair in [place] (Dr. Ronelle Nigh) and felt to be "low risk" for this surgery and given permission to hold Xarelto the night before surgery and resume asap afterwards. Six month follow-up planned. Since then, also cleared for lumbar surgery (L5-S1 laminectomy 04/15/20). On 04/07/20, notation by Edd Fabian, NP states, ".Marland KitchenMarland KitchenPer protocol, patient may hold Xarelto for 1 day prior and resume Xarelto the next day. If a longer hold is needed, than patient will require a bridge."    PAT phone RN could not reach patient on the phone. She left instructions on his voicemail. I contacted Shanda Bumps at Dr. Sueanne Margarita office to notify her. She says patient was instructed to hold Xarelto for 1 day per cardiology instructions. She is not sure if Dr. Franky Macho has reviewed or spoken with Dr. Gwendolyn Grant about ID Dr. Orlando Penner recommendations from 05/15/20. She spoke with Dr. Franky Macho, and at his time, he is planning to delay surgery per ID recommendations.    VS:  BP Readings from Last 3 Encounters:  05/14/20 131/76  05/07/20 (!) 141/87  03/23/20 104/64  Pulse Readings from Last 3 Encounters:  05/14/20 82  05/07/20 (!) 41  03/23/20 68    PROVIDERS: Adrian Prince, MD is PCP  Kristeen Miss, MD is cardiologist Armanda Magic, MD is cardiologist ([facility])   LABS: Currently, most recent lab results include: Lab Results  Component Value Date   WBC 10.4 05/06/2020   HGB 16.0 05/06/2020   HCT 47.0  05/06/2020   PLT 239 05/06/2020   GLUCOSE 106 (H) 05/06/2020   CHOL 128 10/01/2019   TRIG 63 10/01/2019   HDL 49 10/01/2019   LDLCALC 66 10/01/2019   ALT 61 (H) 05/06/2020   AST 33 05/06/2020   NA 138 05/06/2020   K 3.3 (L) 05/06/2020   CL 100 05/06/2020   CREATININE 1.10 05/06/2020   BUN 28 (H) 05/06/2020   CO2 22 05/06/2020   TSH 0.626 10/01/2019   INR 1.1 01/29/2019   HGBA1C 5.9 (H) 01/25/2019    IMAGES: CTA chest/abd/pelvis 05/06/20: IMPRESSION: No evidence of aortic dissection or aneurysmal dilatation. No pulmonary emboli are seen. No acute abnormality in the chest and abdomen.  1V PCXR 05/06/20: FINDINGS: Heart and mediastinal contours are within normal limits. No focal opacities or effusions. No acute bony abnormality. IMPRESSION: No active disease.   EKG: 05/06/20: Sinus rhythm Left ventricular hypertrophy Borderline T abnormalities, inferior leads No significant change since prior 3/16 Confirmed by Meridee Score [phone]) on 05/06/2020 9:20:38 PM   CV: Echo 08/12/2016 Study Conclusions - Left ventricle: The cavity size was mildly dilated. Systolic function was normal. The estimated ejection fraction was in the range of 55% to 60%. Wall motion was normal; there were no regional wall motion abnormalities. There was an increased relative contribution of atrial contraction to ventricular filling. Doppler parameters are consistent with abnormal left ventricular relaxation (grade 1 diastolic dysfunction). - Aorta: Aortic root dimension: 39 mm (ED). - Aortic root: The aortic root was mildly dilated. - Mitral valve: There was mild regurgitation. - Right ventricle: The cavity size was mildly dilated. Wall thickness was normal. - Tricuspid valve: There was trivial regurgitation.  Nuclear stress test 02/06/12: Overall Impression:  Low risk stress nuclear study.  No evidence for ischemia or infarction.  EF mildly decreased.    Past Medical History:   Diagnosis Date  . Arthritis   . Blood dyscrasia    lupus anticoagulant  . Chronic kidney disease    stage 2  . Diastolic dysfunction    Grade I per echo in 2/12  . DVT (deep venous thrombosis) (HCC) 2000's   "both legs in the calves; left went all the way up to my groin" (09/17/2012)  . Dysrhythmia   . GERD (gastroesophageal reflux disease)   . History of bronchitis   . Hypercoagulation syndrome (HCC)    secondary to circulating lupus anticoagulant  . Hyperlipidemia   . Hypertension   . Hypothyroidism    "wiped out from taking amiodarone" (09/17/2012)  . Lyme disease    history of   . Obesity (BMI 30-39.9) 07/17/2014  . PAF (paroxysmal atrial fibrillation) (HCC)    no recurrence since Feb 2012  . Pneumonia   . PONV (postoperative nausea and vomiting)    likes scopolomanine patch  . Pulmonary embolism (HCC) 09/17/2012   "just today; 3 on the right; 2 on the left" (09/17/2012)  . Situational stress   . Sleep apnea    uses CPAP    Past Surgical History:  Procedure Laterality Date  . CARDIOVASCULAR STRESS TEST  07/17/2009   EF  59%  . CARDIOVERSION    . DISTAL BICEPS TENDON REPAIR Right 2011  . INGUINAL HERNIA REPAIR Right 1992  . INGUINAL HERNIA REPAIR Left 2013  . INSERTION OF MESH N/A 05/14/2015   Procedure: INSERTION OF MESH;  Surgeon: Avel Peace, MD;  Location: [facility];  Service: General;  Laterality: N/A;  . SHOULDER ARTHROSCOPY W/ ROTATOR CUFF REPAIR Left 09/04/2012  . SHOULDER SURGERY     left  . TOTAL HIP ARTHROPLASTY Right 11/26/2014   Procedure: RIGHT TOTAL HIP ARTHROPLASTY ANTERIOR APPROACH;  Surgeon: Ollen Gross, MD;  Location: [facility];  Service: Orthopedics;  Laterality: Right;  . TOTAL HIP ARTHROPLASTY Left 01/30/2019   Procedure: TOTAL HIP ARTHROPLASTY ANTERIOR APPROACH;  Surgeon: Ollen Gross, MD;  Location: [facility];  Service: Orthopedics;  Laterality: Left;   . TRANSTHORACIC ECHOCARDIOGRAM  03/05/2010   EF 60-65%  . UMBILICAL HERNIA REPAIR N/A  05/14/2015   Procedure: HERNIA REPAIR UMBILICAL ADULT WITH MESH ;  Surgeon: Avel Peace, MD;  Location: [facility];  Service: General;  Laterality: N/A;  . WISDOM TOOTH EXTRACTION     lower 2    MEDICATIONS: No current facility-administered medications for this encounter.   Carlena Hurl 20 MG TABS tablet  . ALPRAZolam (XANAX) 0.25 MG tablet  . amLODipine (NORVASC) 5 MG tablet  . amoxicillin (AMOXIL) 500 MG capsule  . enoxaparin (LOVENOX) 120 MG/0.8ML injection  . Evolocumab (REPATHA SURECLICK) 140 MG/ML SOAJ  . ezetimibe (ZETIA) 10 MG tablet  . lisinopril (ZESTRIL) 20 MG tablet  . metoprolol succinate (TOPROL-XL) 25 MG 24 hr tablet  . omeprazole (PRILOSEC) 20 MG capsule  . propafenone (RYTHMOL SR) 325 MG 12 hr capsule  . SYNTHROID 150 MCG tablet  . tamsulosin (FLOMAX) 0.4 MG CAPS capsule    Shonna Chock, PA-C Surgical Short Stay/Anesthesiology [facility] Phone [phone] [facility] Phone [phone] 05/18/2020 4:44 PM

## 2020-05-18 NOTE — Anesthesia Preprocedure Evaluation (Deleted)
Anesthesia Evaluation    Airway        Dental   Pulmonary           Cardiovascular hypertension,      Neuro/Psych    GI/Hepatic   Endo/Other    Renal/GU      Musculoskeletal   Abdominal   Peds  Hematology   Anesthesia Other Findings   Reproductive/Obstetrics                             Anesthesia Physical Anesthesia Plan  ASA:   Anesthesia Plan:    Post-op Pain Management:    Induction:   PONV Risk Score and Plan:   Airway Management Planned:   Additional Equipment:   Intra-op Plan:   Post-operative Plan:   Informed Consent:   Plan Discussed with:   Anesthesia Plan Comments: (PAT note written 05/18/2020 by Shonna Chock, PA-C. )        Anesthesia Quick Evaluation

## 2020-05-18 NOTE — Telephone Encounter (Signed)
Cld left message to call for appointment

## 2020-05-18 NOTE — Progress Notes (Signed)
Unable to reach patient via phone.  Left detailed message with instructions for DOS and phone number to call with any questions or concerns.  PCP - Dr Adrian Prince Cardiologist - Dr Laqueta Carina  Chest x-ray - 05/06/20 (1V) EKG - 05/06/20 Stress Test - 02/06/12 ECHO - 08/12/16 Cardiac Cath - n/a  Sleep Study -  Yes CPAP - uses cpap  Blood Thinner Instructions:  Follow your surgeon's instructions on when to stop Xarelto prior to surgery.  ERAS: Clear liquids til 12:30 pm on DOS.  Anesthesia review: Yes  STOP now taking any Aspirin (unless otherwise instructed by your surgeon), Aleve, Naproxen, Ibuprofen, Motrin, Advil, Goody's, BC's, all herbal medications, fish oil, and all vitamins.   Coronavirus Screening Covid test is scheduled on DOS

## 2020-05-25 LAB — CULTURE, BLOOD (ROUTINE X 2)
Culture: NO GROWTH
Culture: NO GROWTH
Special Requests: ADEQUATE
Special Requests: ADEQUATE

## 2020-05-29 ENCOUNTER — Telehealth: Payer: Self-pay

## 2020-05-29 ENCOUNTER — Ambulatory Visit (HOSPITAL_COMMUNITY): Admission: RE | Admit: 2020-05-29 | Payer: BC Managed Care – PPO | Source: Home / Self Care | Admitting: Neurosurgery

## 2020-05-29 HISTORY — DX: Pneumonia, unspecified organism: J18.9

## 2020-05-29 HISTORY — DX: Chronic kidney disease, unspecified: N18.9

## 2020-05-29 SURGERY — LUMBAR LAMINECTOMY/DECOMPRESSION MICRODISCECTOMY 1 LEVEL
Anesthesia: General

## 2020-05-29 NOTE — Telephone Encounter (Signed)
Patient called office regarding appointment scheduled for 5/17. States that he finished IV and oral antibiotics. Denies having any symptoms (fever, swelling,pain). Would like to know if MD needs to see him for his upcoming appt or if appt can be canceled. Juanita Laster, RMA

## 2020-05-30 NOTE — Telephone Encounter (Signed)
He can get blood cultures done by his pcp if he doesnt want to come. The purpose of coming is for repeat blood cultures

## 2020-06-01 NOTE — Telephone Encounter (Signed)
Spoke with patient regarding appointment to repeat blood cultures. Patient does not want to come in unless absolutely necessary. Had blood cultures done on 4/28 which had no growth. Would like to know if those results are adequate enough.  CMA canceled appointment. Will reschedule if needed.

## 2020-06-02 ENCOUNTER — Ambulatory Visit: Payer: BC Managed Care – PPO | Admitting: Internal Medicine

## 2020-06-02 NOTE — Telephone Encounter (Signed)
That is fine with me.  Thanks.

## 2020-06-08 DIAGNOSIS — M5416 Radiculopathy, lumbar region: Secondary | ICD-10-CM | POA: Diagnosis not present

## 2020-06-16 DIAGNOSIS — M5126 Other intervertebral disc displacement, lumbar region: Secondary | ICD-10-CM | POA: Diagnosis not present

## 2020-06-16 DIAGNOSIS — M5416 Radiculopathy, lumbar region: Secondary | ICD-10-CM | POA: Diagnosis not present

## 2020-06-17 DIAGNOSIS — M545 Low back pain, unspecified: Secondary | ICD-10-CM | POA: Diagnosis not present

## 2020-06-19 ENCOUNTER — Ambulatory Visit: Payer: BC Managed Care – PPO | Admitting: Cardiovascular Disease

## 2020-06-24 DIAGNOSIS — C44319 Basal cell carcinoma of skin of other parts of face: Secondary | ICD-10-CM | POA: Diagnosis not present

## 2020-06-24 DIAGNOSIS — L304 Erythema intertrigo: Secondary | ICD-10-CM | POA: Diagnosis not present

## 2020-07-03 DIAGNOSIS — M5430 Sciatica, unspecified side: Secondary | ICD-10-CM | POA: Diagnosis not present

## 2020-07-03 DIAGNOSIS — M545 Low back pain, unspecified: Secondary | ICD-10-CM | POA: Diagnosis not present

## 2020-07-13 DIAGNOSIS — M4627 Osteomyelitis of vertebra, lumbosacral region: Secondary | ICD-10-CM | POA: Diagnosis not present

## 2020-07-13 DIAGNOSIS — Z01818 Encounter for other preprocedural examination: Secondary | ICD-10-CM | POA: Diagnosis not present

## 2020-07-13 DIAGNOSIS — M9963 Osseous and subluxation stenosis of intervertebral foramina of lumbar region: Secondary | ICD-10-CM | POA: Diagnosis not present

## 2020-07-13 DIAGNOSIS — M9973 Connective tissue and disc stenosis of intervertebral foramina of lumbar region: Secondary | ICD-10-CM | POA: Diagnosis not present

## 2020-07-13 DIAGNOSIS — M5137 Other intervertebral disc degeneration, lumbosacral region: Secondary | ICD-10-CM | POA: Diagnosis not present

## 2020-07-13 DIAGNOSIS — M5459 Other low back pain: Secondary | ICD-10-CM | POA: Diagnosis not present

## 2020-07-13 DIAGNOSIS — Z01812 Encounter for preprocedural laboratory examination: Secondary | ICD-10-CM | POA: Diagnosis not present

## 2020-07-13 DIAGNOSIS — M2578 Osteophyte, vertebrae: Secondary | ICD-10-CM | POA: Diagnosis not present

## 2020-07-13 DIAGNOSIS — M9953 Intervertebral disc stenosis of neural canal of lumbar region: Secondary | ICD-10-CM | POA: Diagnosis not present

## 2020-07-13 DIAGNOSIS — M533 Sacrococcygeal disorders, not elsewhere classified: Secondary | ICD-10-CM | POA: Diagnosis not present

## 2020-07-13 DIAGNOSIS — M4306 Spondylolysis, lumbar region: Secondary | ICD-10-CM | POA: Diagnosis not present

## 2020-07-13 DIAGNOSIS — M5136 Other intervertebral disc degeneration, lumbar region: Secondary | ICD-10-CM | POA: Diagnosis not present

## 2020-07-13 DIAGNOSIS — M4646 Discitis, unspecified, lumbar region: Secondary | ICD-10-CM | POA: Diagnosis not present

## 2020-07-13 DIAGNOSIS — M9943 Connective tissue stenosis of neural canal of lumbar region: Secondary | ICD-10-CM | POA: Diagnosis not present

## 2020-07-13 DIAGNOSIS — R8279 Other abnormal findings on microbiological examination of urine: Secondary | ICD-10-CM | POA: Diagnosis not present

## 2020-07-27 DIAGNOSIS — M4646 Discitis, unspecified, lumbar region: Secondary | ICD-10-CM | POA: Diagnosis not present

## 2020-07-27 DIAGNOSIS — M5137 Other intervertebral disc degeneration, lumbosacral region: Secondary | ICD-10-CM | POA: Diagnosis not present

## 2020-07-27 DIAGNOSIS — M4627 Osteomyelitis of vertebra, lumbosacral region: Secondary | ICD-10-CM | POA: Diagnosis not present

## 2020-07-30 DIAGNOSIS — C44319 Basal cell carcinoma of skin of other parts of face: Secondary | ICD-10-CM | POA: Diagnosis not present

## 2020-08-04 ENCOUNTER — Telehealth (HOSPITAL_COMMUNITY): Payer: Self-pay | Admitting: Cardiovascular Disease

## 2020-08-04 NOTE — Telephone Encounter (Signed)
Pt c/o medication issue:  1. Name of Medication: Metoprolol, Norvasc and Lisinopril  2. How are you currently taking this medication (dosage and times per day)? Lisinopril 2 times a day and Metoprolol and Norvaasc 1 time a dayo  3. Are you having a reaction (difficulty breathing--STAT)?   4. What is your medication issue?  Lost 40 lbs since January and blood pressure running too low- 100/62 was his last reading- he thinks he taking too much medicinel

## 2020-08-04 NOTE — Telephone Encounter (Signed)
Spoke to the patient. He does not check his blood pressures at home. He has had back surgery recently and can remember some BP readings. 105/56, 110/64. 229 pounds is current weight this morning. ED precautions given. Will forward to MD for advisement.

## 2020-08-06 DIAGNOSIS — M79632 Pain in left forearm: Secondary | ICD-10-CM | POA: Diagnosis not present

## 2020-08-06 DIAGNOSIS — M79642 Pain in left hand: Secondary | ICD-10-CM | POA: Diagnosis not present

## 2020-08-06 DIAGNOSIS — M79631 Pain in right forearm: Secondary | ICD-10-CM | POA: Diagnosis not present

## 2020-08-06 DIAGNOSIS — S6402XS Injury of ulnar nerve at wrist and hand level of left arm, sequela: Secondary | ICD-10-CM | POA: Diagnosis not present

## 2020-08-06 DIAGNOSIS — M79641 Pain in right hand: Secondary | ICD-10-CM | POA: Diagnosis not present

## 2020-08-06 DIAGNOSIS — S6401XS Injury of ulnar nerve at wrist and hand level of right arm, sequela: Secondary | ICD-10-CM | POA: Diagnosis not present

## 2020-08-11 DIAGNOSIS — S6402XS Injury of ulnar nerve at wrist and hand level of left arm, sequela: Secondary | ICD-10-CM | POA: Diagnosis not present

## 2020-08-11 DIAGNOSIS — S6401XS Injury of ulnar nerve at wrist and hand level of right arm, sequela: Secondary | ICD-10-CM | POA: Diagnosis not present

## 2020-08-11 DIAGNOSIS — M79642 Pain in left hand: Secondary | ICD-10-CM | POA: Diagnosis not present

## 2020-08-11 DIAGNOSIS — M79632 Pain in left forearm: Secondary | ICD-10-CM | POA: Diagnosis not present

## 2020-08-11 DIAGNOSIS — M79631 Pain in right forearm: Secondary | ICD-10-CM | POA: Diagnosis not present

## 2020-08-11 DIAGNOSIS — M79641 Pain in right hand: Secondary | ICD-10-CM | POA: Diagnosis not present

## 2020-08-13 DIAGNOSIS — M79632 Pain in left forearm: Secondary | ICD-10-CM | POA: Diagnosis not present

## 2020-08-13 DIAGNOSIS — S6402XS Injury of ulnar nerve at wrist and hand level of left arm, sequela: Secondary | ICD-10-CM | POA: Diagnosis not present

## 2020-08-13 DIAGNOSIS — S6401XS Injury of ulnar nerve at wrist and hand level of right arm, sequela: Secondary | ICD-10-CM | POA: Diagnosis not present

## 2020-08-13 DIAGNOSIS — M79641 Pain in right hand: Secondary | ICD-10-CM | POA: Diagnosis not present

## 2020-08-13 DIAGNOSIS — M79631 Pain in right forearm: Secondary | ICD-10-CM | POA: Diagnosis not present

## 2020-08-13 DIAGNOSIS — M79642 Pain in left hand: Secondary | ICD-10-CM | POA: Diagnosis not present

## 2020-08-18 ENCOUNTER — Other Ambulatory Visit: Payer: Self-pay | Admitting: Cardiovascular Disease

## 2020-08-18 DIAGNOSIS — S6401XS Injury of ulnar nerve at wrist and hand level of right arm, sequela: Secondary | ICD-10-CM | POA: Diagnosis not present

## 2020-08-18 DIAGNOSIS — M79632 Pain in left forearm: Secondary | ICD-10-CM | POA: Diagnosis not present

## 2020-08-18 DIAGNOSIS — M79631 Pain in right forearm: Secondary | ICD-10-CM | POA: Diagnosis not present

## 2020-08-18 DIAGNOSIS — E782 Mixed hyperlipidemia: Secondary | ICD-10-CM

## 2020-08-18 DIAGNOSIS — M79641 Pain in right hand: Secondary | ICD-10-CM | POA: Diagnosis not present

## 2020-08-18 DIAGNOSIS — S6402XS Injury of ulnar nerve at wrist and hand level of left arm, sequela: Secondary | ICD-10-CM | POA: Diagnosis not present

## 2020-08-18 DIAGNOSIS — M79642 Pain in left hand: Secondary | ICD-10-CM | POA: Diagnosis not present

## 2020-08-19 DIAGNOSIS — M79641 Pain in right hand: Secondary | ICD-10-CM | POA: Insufficient documentation

## 2020-08-19 DIAGNOSIS — R200 Anesthesia of skin: Secondary | ICD-10-CM | POA: Diagnosis not present

## 2020-08-19 DIAGNOSIS — M79642 Pain in left hand: Secondary | ICD-10-CM | POA: Insufficient documentation

## 2020-08-19 DIAGNOSIS — G5623 Lesion of ulnar nerve, bilateral upper limbs: Secondary | ICD-10-CM | POA: Diagnosis not present

## 2020-08-20 DIAGNOSIS — G5623 Lesion of ulnar nerve, bilateral upper limbs: Secondary | ICD-10-CM | POA: Insufficient documentation

## 2020-09-03 DIAGNOSIS — G5623 Lesion of ulnar nerve, bilateral upper limbs: Secondary | ICD-10-CM | POA: Diagnosis not present

## 2020-09-03 DIAGNOSIS — G5603 Carpal tunnel syndrome, bilateral upper limbs: Secondary | ICD-10-CM | POA: Diagnosis not present

## 2020-09-14 ENCOUNTER — Telehealth: Payer: Self-pay | Admitting: *Deleted

## 2020-09-14 NOTE — Telephone Encounter (Signed)
   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 Under Visit Info/Reason for Call, type in Other and utilize the format Clearance MM/DD/YY or Clearance TBD. Do not use dashes or single digits. - Please review there is not already an duplicate clearance open for this procedure. - If request is for dental extraction, please clarify the # of teeth to be extracted. - If the patient is currently at the dentist's office, call Pre-Op Callback Staff (MA/nurse) to input urgent request.  - If the patient is not currently in the dentist office, please route to the Pre-Op pool.  Request for surgical clearance:  What type of surgery is being performed? B/L ULNAR NERVE RELEASE AT THE ELBOW  When is this surgery scheduled? 09/18/20  What type of clearance is required (medical clearance vs. Pharmacy clearance to hold med vs. Both)? BOTH  Are there any medications that need to be held prior to surgery and how long? [facility]  Practice name and name of physician performing surgery? [facility]; DR. Roseanne Kaufman  What is the office phone number? [phone]   7.   What is the office fax number? [place]   8.   Anesthesia type (None, local, MAC, general) ? GENERAL   Julaine Hua 09/14/2020, 3:31 PM  _________________________________________________________________   (provider comments below)

## 2020-09-14 NOTE — Telephone Encounter (Signed)
Pharmacy, can you please comment on how long Xarelto can be held for upcoming procedure?  Thank you! 

## 2020-09-15 NOTE — Telephone Encounter (Signed)
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    Procedure: B/L ULNAR NERVE RELEASE AT THE ELBOW Date of procedure: 09/18/20  CHA2DS2-VASc Score = 4  This indicates a 4.8% annual risk of stroke. The patient's score is based upon: CHF History: No HTN History: Yes Diabetes History: No Stroke History: Yes (recurrent VTE) Vascular Disease History: Yes Age Score: 0 Gender Score: 0     CrCl 100 ml/min Platelet count 239  Per office protocol, patient can hold Xarelto for 1 day prior to procedure.   If longer than a 1 day hold is needed, then patient will need to be bridged with lovenox

## 2020-09-15 NOTE — Telephone Encounter (Signed)
    Patient Name: Isaiah [name], Isaiah [name]  DOB: August 24, 1960 MRN: [id number]  Primary Cardiologist: Kristeen Miss, MD  Chart reviewed as part of pre-operative protocol coverage. Patient was last seen by Dr. Elease Hashimoto in 03/2020. Patient was contacted today for further pre-op evaluation and reported doing well since last visit. No chest pain, shortness of breath, orthopnea/PND, palpitations, lightheadedness, dizziness, syncope. Able to complete >4.0 METS without any anginal symptoms. Given past medical history and time since last visit, based on ACC/AHA guidelines, Isaiah [name], Isaiah [name] would be at acceptable risk for the planned procedure without further cardiovascular testing.   Per Pharmacy and office protocol: Patient can hold Xarelto for 1 day prior to procedure.If longer than a 1 day hold is needed, then patient will need to be bridged with Lovenox. I personally called and spoke with Ortho office who stated it was OK to hold for 1 day. Notified patient of this as well - he knows to hold his Xarelto the night before. Please restart Xarelto as soon as possible following procedure given history of recurrent VTE.  I will route this recommendation to the requesting party via Epic fax function and remove from pre-op pool.  Please call with questions.  Corrin Parker, PA-C 09/15/2020, 4:00 PM

## 2020-09-18 DIAGNOSIS — R52 Pain, unspecified: Secondary | ICD-10-CM | POA: Insufficient documentation

## 2020-09-18 DIAGNOSIS — G5623 Lesion of ulnar nerve, bilateral upper limbs: Secondary | ICD-10-CM | POA: Diagnosis not present

## 2020-09-18 DIAGNOSIS — G5622 Lesion of ulnar nerve, left upper limb: Secondary | ICD-10-CM | POA: Diagnosis not present

## 2020-10-02 ENCOUNTER — Ambulatory Visit
Admission: RE | Admit: 2020-10-02 | Discharge: 2020-10-02 | Disposition: A | Discharge status: Home / Self Care | Payer: BC Managed Care – PPO | Source: Ambulatory Visit | Attending: Neurological Surgery | Admitting: Neurological Surgery

## 2020-10-02 ENCOUNTER — Other Ambulatory Visit: Payer: Self-pay

## 2020-10-02 ENCOUNTER — Other Ambulatory Visit: Payer: Self-pay | Admitting: Neurological Surgery

## 2020-10-02 DIAGNOSIS — M5126 Other intervertebral disc displacement, lumbar region: Secondary | ICD-10-CM | POA: Diagnosis not present

## 2020-10-02 DIAGNOSIS — L7634 Postprocedural seroma of skin and subcutaneous tissue following other procedure: Secondary | ICD-10-CM

## 2020-10-02 MED ORDER — GADOBENATE DIMEGLUMINE 529 MG/ML IV SOLN
20.0000 mL | Freq: Once | INTRAVENOUS | Status: AC | PRN
  Administered 2020-10-02: 20 mL via INTRAVENOUS

## 2020-10-03 ENCOUNTER — Other Ambulatory Visit: Payer: BC Managed Care – PPO

## 2020-10-05 ENCOUNTER — Other Ambulatory Visit: Payer: Self-pay | Admitting: Neurological Surgery

## 2020-10-05 DIAGNOSIS — L7634 Postprocedural seroma of skin and subcutaneous tissue following other procedure: Secondary | ICD-10-CM

## 2020-10-06 DIAGNOSIS — Z4889 Encounter for other specified surgical aftercare: Secondary | ICD-10-CM | POA: Diagnosis not present

## 2020-10-06 DIAGNOSIS — M4646 Discitis, unspecified, lumbar region: Secondary | ICD-10-CM | POA: Diagnosis not present

## 2020-10-06 DIAGNOSIS — M4627 Osteomyelitis of vertebra, lumbosacral region: Secondary | ICD-10-CM | POA: Diagnosis not present

## 2020-10-09 ENCOUNTER — Ambulatory Visit: Payer: BC Managed Care – PPO | Admitting: Cardiovascular Disease

## 2020-10-12 DIAGNOSIS — M728 Other fibroblastic disorders: Secondary | ICD-10-CM | POA: Diagnosis not present

## 2020-10-20 ENCOUNTER — Other Ambulatory Visit: Payer: Self-pay | Admitting: Cardiovascular Disease

## 2020-10-20 NOTE — Telephone Encounter (Signed)
Xarelto 20mg  refill request received. Pt is 60 years old, weight-115kg, Crea-1.10 on 05/06/20, last seen by Dr. 05/08/20 on 03/23/2020, Diagnosis-Afib, DVT, & PE, CrCl-119.76ml/min; Dose is appropriate based on dosing criteria. Will send in refill to requested pharmacy.

## 2020-11-03 DIAGNOSIS — L812 Freckles: Secondary | ICD-10-CM | POA: Diagnosis not present

## 2020-11-03 DIAGNOSIS — Z85828 Personal history of other malignant neoplasm of skin: Secondary | ICD-10-CM | POA: Diagnosis not present

## 2020-11-03 DIAGNOSIS — D225 Melanocytic nevi of trunk: Secondary | ICD-10-CM | POA: Diagnosis not present

## 2020-11-03 DIAGNOSIS — L570 Actinic keratosis: Secondary | ICD-10-CM | POA: Diagnosis not present

## 2020-11-03 DIAGNOSIS — L821 Other seborrheic keratosis: Secondary | ICD-10-CM | POA: Diagnosis not present

## 2020-11-05 ENCOUNTER — Ambulatory Visit (INDEPENDENT_AMBULATORY_CARE_PROVIDER_SITE_OTHER): Payer: BC Managed Care – PPO | Admitting: Internal Medicine

## 2020-11-05 ENCOUNTER — Other Ambulatory Visit: Payer: Self-pay

## 2020-11-05 ENCOUNTER — Encounter: Payer: Self-pay | Admitting: Internal Medicine

## 2020-11-05 DIAGNOSIS — G8929 Other chronic pain: Secondary | ICD-10-CM | POA: Insufficient documentation

## 2020-11-05 DIAGNOSIS — M545 Low back pain, unspecified: Secondary | ICD-10-CM | POA: Diagnosis not present

## 2020-11-05 NOTE — Progress Notes (Signed)
[facility]      Reason for Consult: ? Lumbar discitis    Referring Physician: Dr. Forde Dandy    Patient ID: Isaiah [name], [name], male    DOB: [birth year], 60 y.o.   MRN: [id number]  HPI:   Here for evaluation for infection concern of his lumbar/sacral area.   He has a history of microdiscectomy of the L5-S1 area done by Dr. Christella Noa on April 15, 2020 who initially did well but in April had returned there with increasing back pain.  He had been given steroids x 2 but continued to have significant pain.  He went to a clinic in [place] and underwent an injection of fibrous/filler material which did not help the pain.  This persisted and initially was scheduled by Dr. Christella Noa to have an exploration of the back but opted to go to [facility] to see a spine specialist there and underwent a surgical procedure there in about June.  He returned there and had another surgical procedure then in September of this year had an MRI done locally by Dr. Shelda Altes and noted fluid and he returned to [facility] for exploration and reportedly everything was normal/no issues.  No records available but he is a good historian.  His pain is improved overall but still quit significant.     Past Medical History:  Diagnosis Date   Arthritis    Blood dyscrasia    lupus anticoagulant   Chronic kidney disease    stage 2   Diastolic dysfunction    Grade I per echo in 2/12   DVT (deep venous thrombosis) ([place]) 2000's   "both legs in the calves; left went all the way up to my groin" (09/17/2012)   Dysrhythmia    GERD (gastroesophageal reflux disease)    History of bronchitis    Hypercoagulation syndrome ([place])    secondary to circulating lupus anticoagulant   Hyperlipidemia    Hypertension    Hypothyroidism    "wiped out from taking amiodarone" (09/17/2012)   Lyme disease    history of    Obesity (BMI 30-39.9) 07/17/2014   PAF (paroxysmal atrial fibrillation) ([place])    no recurrence since Feb 2012   Pneumonia     PONV (postoperative nausea and vomiting)    likes scopolomanine patch   Pulmonary embolism ([place]) 09/17/2012   "just today; 3 on the right; 2 on the left" (09/17/2012)   Situational stress    Sleep apnea    uses CPAP    Prior to Admission medications   Medication Sig Start Date End Date Taking? Authorizing Provider  ALPRAZolam Duanne Moron) 0.25 MG tablet Take 1 tablet by mouth 2 (two) times daily.  01/25/17  Yes [provider]  Evolocumab (REPATHA SURECLICK) 157 MG/ML SOAJ INJECT 1 PEN INTO THE SKIN EVERY 14 DAYS 08/19/20  Yes [name], Wonda Cheng, MD  ezetimibe (ZETIA) 10 MG tablet Take 1 tablet (10 mg total) by mouth daily. **PATIENT NEEDS APT FOR FURTHER REFILLS** 09/17/19  Yes Abonza, Maritza, PA-C  lisinopril (ZESTRIL) 20 MG tablet Take 20 mg by mouth daily. 01/27/20  Yes [provider]  metoprolol succinate (TOPROL-XL) 25 MG 24 hr tablet TAKE 1 TABLET BY MOUTH EACH MORNING 12/25/19  Yes [name], Wonda Cheng, MD  omeprazole (PRILOSEC) 20 MG capsule Take 1 capsule (20 mg total) by mouth daily. 03/18/14  Yes Jola Schmidt, MD  propafenone (RYTHMOL SR) 325 MG 12 hr capsule TAKE 1 CAPSULE BY MOUTH TWICE DAILY 02/27/20  Yes  [name], Wonda Cheng, MD  SYNTHROID 150 MCG tablet Take 300 mcg by mouth daily.  07/07/14  Yes [provider]  tamsulosin (FLOMAX) 0.4 MG CAPS capsule Take 0.4 mg by mouth every evening.  01/21/19  Yes [provider]  XARELTO 20 MG TABS tablet TAKE 1 TABLET BY MOUTH DAILY 10/20/20  Yes [name], Wonda Cheng, MD  amoxicillin (AMOXIL) 500 MG capsule Take 2 capsules (1,000 mg total) by mouth 2 (two) times daily. 05/14/20   Varney Biles, MD  enoxaparin (LOVENOX) 120 MG/0.8ML injection Inject 0.8 mLs (120 mg total) into the skin every 12 (twelve) hours. 03/25/20   [name], Wonda Cheng, MD    Allergies  Allergen Reactions   Crestor [Rosuvastatin Calcium] Other (See Comments)    hepatitis   Lipitor [Atorvastatin Calcium] Other (See Comments)    Feels like someone hit him with  2x4    Social History   Tobacco Use   Smoking status: Never   Smokeless tobacco: Never  Vaping Use   Vaping Use: Never used  Substance Use Topics   Alcohol use: Yes    Alcohol/week: 4.0 - 8.0 standard drinks    Types: 2 - 4 Cans of beer, 2 - 4 Shots of liquor per week    Comment: 09/17/2012 "1-2 beers and 1-2 tequila on Fri and Sat nights"   Drug use: No    Family History  Problem Relation Age of Onset   Hypertension Mother    Hyperlipidemia Mother    Cancer Mother        breast   Hypertension Father    Cancer Brother        prostate   Hypertension Brother    Heart attack Maternal Grandfather     Review of Systems  Constitutional: negative for fevers and chills Integument/breast: negative for rash All other systems reviewed and are negative    Constitutional: in no apparent distress  Vitals:   11/05/20 1516  BP: 135/90  Pulse: 77  Temp: 98.3 F (36.8 C)  SpO2: 98%   EYES: anicteric Respiratory: normal respiratory effort Musculoskeletal: no pedal edema noted Skin: negatives: no rash Neuro: non-focal but limited due to back pain  Labs: Lab Results  Component Value Date   WBC 10.4 05/06/2020   HGB 16.0 05/06/2020   HCT 47.0 05/06/2020   MCV 81.8 05/06/2020   PLT 239 05/06/2020    Lab Results  Component Value Date   CREATININE 1.10 05/06/2020   BUN 28 (H) 05/06/2020   NA 138 05/06/2020   K 3.3 (L) 05/06/2020   CL 100 05/06/2020   CO2 22 05/06/2020    Lab Results  Component Value Date   ALT 61 (H) 05/06/2020   AST 33 05/06/2020   ALKPHOS 58 05/06/2020   BILITOT 0.6 05/06/2020   INR 1.1 01/29/2019     Assessment: possible discitis due to pain, unremitting.  MRI done in September with concerns for infection but reported evaluation in [facility] unrevealing with surgical exploration.  He also by his report had a positive blood culture with a GNR and earlier this year had 2 positive anaerobic blood cultures positive for Proprionibacterium acnes.  On repeat  these were negative, though he was treated for a short time with amoxicilllin. At this point, infection seems less likely but still possible.  I discussed with him the options and will check his inflammatory markers, blood cultures and consider a trial of IV antibiotics if there are any, even slight abnormalities and see if he  notes any improvement in 2-3 weeks time.  If everything though is normal/negative, will continue with just observation.   Plan: 1)  CRP, ESR, Blood cultures 2) cmp, cbc in case he needs a picc line  Follow up depending on above

## 2020-11-10 ENCOUNTER — Telehealth: Payer: Self-pay

## 2020-11-10 NOTE — Telephone Encounter (Signed)
-----  Message from Tomi Bamberger, [place] sent at 11/10/2020  4:29 PM EDT -----  ----- Message ----- From: Thayer Headings, MD Sent: 11/10/2020   4:25 PM EDT To: [facility] Triage Nurse Pool  Could you let him know that his ESR and CRP are in the normal range and his blood cultures are without any growth so infection does not seem to explain his back pain and really no indication for any antibiotic treatment. We had discussed with anything abnormal we could consider a picc line and antibiotics but really nothing at all to lead [place] to do that.  I tried to call him directly but could not get through.  Maybe through mychart.  thanks

## 2020-11-10 NOTE — Telephone Encounter (Signed)
MyChart message sent.   Crystian Frith D [name], RN  

## 2020-11-11 LAB — COMPREHENSIVE METABOLIC PANEL
AG Ratio: 1.5 (calc) (ref 1.0–2.5)
ALT: 27 U/L (ref 9–46)
AST: 28 U/L (ref 10–35)
Albumin: 4.1 g/dL (ref 3.6–5.1)
Alkaline phosphatase (APISO): 76 U/L (ref 35–144)
BUN: 17 mg/dL (ref 7–25)
CO2: 28 mmol/L (ref 20–32)
Calcium: 9.3 mg/dL (ref 8.6–10.3)
Chloride: 104 mmol/L (ref 98–110)
Creat: 1.27 mg/dL (ref 0.70–1.30)
Globulin: 2.8 g/dL (calc) (ref 1.9–3.7)
Glucose, Bld: 72 mg/dL (ref 65–99)
Potassium: 4 mmol/L (ref 3.5–5.3)
Sodium: 140 mmol/L (ref 135–146)
Total Bilirubin: 0.6 mg/dL (ref 0.2–1.2)
Total Protein: 6.9 g/dL (ref 6.1–8.1)

## 2020-11-11 LAB — CULTURE, BLOOD (SINGLE)
MICRO NUMBER:: 12534713
MICRO NUMBER:: 12534714
Result:: NO GROWTH
Result:: NO GROWTH
SPECIMEN QUALITY:: ADEQUATE
SPECIMEN QUALITY:: ADEQUATE

## 2020-11-11 LAB — CBC WITH DIFFERENTIAL/PLATELET
Absolute Monocytes: 484 cells/uL (ref 200–950)
Basophils Absolute: 31 cells/uL (ref 0–200)
Basophils Relative: 0.6 %
Eosinophils Absolute: 42 cells/uL (ref 15–500)
Eosinophils Relative: 0.8 %
HCT: 46 % (ref 38.5–50.0)
Hemoglobin: 14.7 g/dL (ref 13.2–17.1)
Lymphs Abs: 1492 cells/uL (ref 850–3900)
MCH: 27.1 pg (ref 27.0–33.0)
MCHC: 32 g/dL (ref 32.0–36.0)
MCV: 84.7 fL (ref 80.0–100.0)
MPV: 10.6 fL (ref 7.5–12.5)
Monocytes Relative: 9.3 %
Neutro Abs: 3151 cells/uL (ref 1500–7800)
Neutrophils Relative %: 60.6 %
Platelets: 200 10*3/uL (ref 140–400)
RBC: 5.43 10*6/uL (ref 4.20–5.80)
RDW: 14.3 % (ref 11.0–15.0)
Total Lymphocyte: 28.7 %
WBC: 5.2 10*3/uL (ref 3.8–10.8)

## 2020-11-11 LAB — SEDIMENTATION RATE: Sed Rate: 11 mm/h (ref 0–20)

## 2020-11-11 LAB — C-REACTIVE PROTEIN: CRP: 3.5 mg/L (ref <8.0)

## 2020-11-16 DIAGNOSIS — E291 Testicular hypofunction: Secondary | ICD-10-CM | POA: Diagnosis not present

## 2020-11-16 DIAGNOSIS — E039 Hypothyroidism, unspecified: Secondary | ICD-10-CM | POA: Diagnosis not present

## 2020-11-16 DIAGNOSIS — E785 Hyperlipidemia, unspecified: Secondary | ICD-10-CM | POA: Diagnosis not present

## 2020-11-16 DIAGNOSIS — Z23 Encounter for immunization: Secondary | ICD-10-CM | POA: Diagnosis not present

## 2020-12-17 ENCOUNTER — Other Ambulatory Visit: Payer: Self-pay | Admitting: Cardiovascular Disease

## 2020-12-21 ENCOUNTER — Ambulatory Visit (INDEPENDENT_AMBULATORY_CARE_PROVIDER_SITE_OTHER): Payer: BC Managed Care – PPO | Admitting: Cardiovascular Disease

## 2020-12-21 ENCOUNTER — Other Ambulatory Visit: Payer: Self-pay

## 2020-12-21 ENCOUNTER — Encounter: Payer: Self-pay | Admitting: Cardiovascular Disease

## 2020-12-21 VITALS — BP 138/82 | HR 73 | Ht 75.0 in | Wt 267.6 lb

## 2020-12-21 DIAGNOSIS — Z79899 Other long term (current) drug therapy: Secondary | ICD-10-CM | POA: Diagnosis not present

## 2020-12-21 DIAGNOSIS — E782 Mixed hyperlipidemia: Secondary | ICD-10-CM

## 2020-12-21 DIAGNOSIS — I10 Essential (primary) hypertension: Secondary | ICD-10-CM

## 2020-12-21 NOTE — Patient Instructions (Signed)
Medication Instructions:  The current medical regimen is effective;  continue present plan and medications.  *If you need a refill on your cardiac medications before your next appointment, please call your pharmacy*  Lab Work: Please have blood work in 4 months (lipid, BMP and ALT)  If you have labs (blood work) drawn today and your tests are completely normal, you will receive your results only by: MyChart Message (if you have MyChart) OR A paper copy in the mail If you have any lab test that is abnormal or we need to change your treatment, we will call you to review the results.  Follow-Up: At [facility], you and your health needs are our priority.  As part of our continuing mission to provide you with exceptional heart care, we have created designated Provider Care Teams.  These Care Teams include your primary Cardiologist (physician) and Advanced Practice Providers (APPs -  Physician Assistants and Nurse Practitioners) who all work together to provide you with the care you need, when you need it.  We recommend signing up for the patient portal called "MyChart".  Sign up information is provided on this After Visit Summary.  MyChart is used to connect with patients for Virtual Visits (Telemedicine).  Patients are able to view lab/test results, encounter notes, upcoming appointments, etc.  Non-urgent messages can be sent to your provider as well.   To learn more about what you can do with MyChart, go to [email or web address].    Your next appointment:   4 month(s)  The format for your next appointment:   In Person  Provider:   Kristeen Miss, MD     Thank you for choosing [facility]!!

## 2020-12-21 NOTE — Progress Notes (Signed)
CARDIOLOGY OFFICE NOTE  Date:  12/21/2020    Isaiah [name], Isaiah [name] Date of Birth: [birth year] Medical Record [id number]  PCP:  Reynold Bowen, MD  Cardiologist:  Former patient of Dr. Sherryl Barters.   Now Isaiah [name]  Chief Complaint  Patient presents with   Hypertension        Atrial Fibrillation        Hyperlipidemia   Problem list 1. History of recurrent pulmonary embolus 2. Hypercoagulability - Lupus anticoagulant  3. Hypertension 4. Osteoarthritis-status post hip replacements 5.  Paroxysmal atrial fibrillation 6. Lyme disease - 2014    Isaiah [name], Isaiah [name] is a 60 y.o. male who presents today for a 4 month check. Former patient of Dr. Sherryl Barters.   He has a history of prior PE and found to have a hypercoagulability state with abnormal lupus anticoagulant - he is committed to lifelong anticoagulation and is on Xarelto. He has had frequent DVT, PAF, hypothyroidism, HLD and HTN.  He has sleep apnea and is on CPAP - followed by Dr. Radford Pax.   In January 2014 the patient experienced some chest tightness and underwent a treadmill Myoview stress test which showed no evidence of ischemia and his ejection fraction was 53% with no wall motion abnormalities.   Last seen in November. Cardiac status was stable.   Comes back today. Here alone. He is doing well. He had his hip replacement back in November - did well. Back on track with losing weight - says he is down 20 pounds. BP is lower. No chest pain. Breathing is good. Rhythm is good. Asking about reduction of medicines. His significant other has been diagnosed with breast cancer and he has been helping with her.    June 19, 2015:  Isaiah [name] is seen today for initial visit.   He is a former patient of [facility]. Has hx of PAF - none in the past 6-8 years.    Seems to be triggered by large doses of caffiene.  Has cardioverted on 1 occasion but usually he will convert back into NSR after a day or so .   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   No weight lifting due to a recent hernia repair .   Dec. 1, 2017:  Isaiah [name] is seen today in follow-up for his paroxysmal atrial fib ablation, diastolic dysfunction, and hypertension. No palps  June 21, 2016: Still very active.  Used to lift heavy weight and fight MMA . Does cardio regularly. Does interval  , ellipitical  Dental practice is going well.    No episodes of atrial fib - none in the past 6-8 years  Has hx of DVT ,  He walks frequently when flying   August 04, 2016:  Isaiah [name] is seen today for work in visit. He started having episodes of chest pain last week.  He went on vacation a week ago Ate too much salt, Came home ,  Took Lasix for swelling  Had a non productive cough.   Chest tightness.   Pressure like sensation No pleuretic CP,  Difficult to take a deep breath No fever , dry cough.  Felt a bit better after Z-pac.  But chest pressure persisted.  Feels different from his pulmonary embolus pain   Echo in 2014 showed normal LV function   Sept. 12, 2018:  Isaiah [name] is seen back after having an episode of chest pain  Coronary CT  angio shows a prox LAD stenosis of 30%.  Calcium score of 44 ( 72 percentile for age )  On Zetia,  Had problems with crestor ( elevated liver enzymes)  and Lipitor ( profound muscle aches )  .   Feeling better.  Still working out . No CP   May 19, 2017:   Doing well. Went on [employer] and lost 20 lbs.   Feeling well .  Has not had any recurrent PAF  - last episode was 10 years ago   November 17, 2017:  Isaiah [name] is seen today for follow-up of his paroxysmal atrial fibrillation.  He also has a hypercoagulable state and is on Xarelto. Is a history of very mild coronary artery disease.  Coronary CT angiogram showed a proximal LAD stenosis of approximately 30%.  Coronary calcium score is 44.  He is on Zetia and Repatha. Very busy at work .   Has not been working out as much .  Has  OSA - wears his CPAP ,  Seems to be working well   March 29, 2019:  Isaiah [name] is seen today for follow-up of his paroxysmal atrial fibrillation and hypercoagulable state.  He remains on Xarelto.  He has very mild coronary artery disease.  Coronary CT angiogram showed a proximal stenosis of approximately 30%.  His coronary calcium score is 44.  Remains on Zetia and Repatha. Had a hip replacement this past year  No covid   Sept. 14, 2021:  Isaiah [name] is seen today for paroxysmal atrial fibrillation.  He has a hypercoagulable state and has not had a pulmonary embolus in the past.  He is on Xarelto.  CT angiogram reveals a proximal LAD stenosis of approximately 30%.  His coronary calcium score is 44.  He remains on Zetia and Repatha.  Is getting over covid pneumonia  Got back from a motorcycle trip up in [place].  Became very short of breath CT angio rules out PE,    Worked all week.  Spiked a temp a week later.   Thinks he may have caught covid in the ER  + night sweats ,  Went to [employer] . Took omnicef, albuterol , monoclonal antibodies Is better now  Still thinks he feels something in his chest  O2 sats are normal .  No pleuretic CP,  Staying hydrated.   Nov. 18, 2021 Isaiah [name] is seen today for follow up of his HTN, PAF,  Hx of DVT  No cp. Has some DOE if he climbs several flights of stairs   March 23, 2020: Isaiah [name] is seen today for follow up of his HTN, PAF, hx of DVT. Reported some increasing DOE at his last visit Had a chest CT angiogram in Aug. 2021 which was negative for PE  Is down 20 lbs ,  BP looks great    Dec. 5, 2022 Isaiah [name] is seen for follow up of hi sHTN, PAF, hx of CVT Had ventral hernia repair since I last saw him Has had back surgery . Slow recovery  Has lots of back spasm  Getting some ankle edema .   Is eating more salt. Less exercise .   Past Medical History:  Diagnosis Date   Arthritis    Blood dyscrasia    lupus anticoagulant   Chronic  kidney disease    stage 2   Diastolic dysfunction    Grade I per echo in 2/12   DVT (deep venous thrombosis) ([place]) 2000's   "both legs in the  calves; left went all the way up to my groin" (09/17/2012)   Dysrhythmia    GERD (gastroesophageal reflux disease)    History of bronchitis    Hypercoagulation syndrome ([place])    secondary to circulating lupus anticoagulant   Hyperlipidemia    Hypertension    Hypothyroidism    "wiped out from taking amiodarone" (09/17/2012)   Lyme disease    history of    Obesity (BMI 30-39.9) 07/17/2014   PAF (paroxysmal atrial fibrillation) ([facility])    no recurrence since Feb 2012   Pneumonia    PONV (postoperative nausea and vomiting)    likes scopolomanine patch   Pulmonary embolism ([place] [facility]) 09/17/2012   "just today; 3 on the right; 2 on the left" (09/17/2012)   Situational stress    Sleep apnea    uses CPAP    Past Surgical History:  Procedure Laterality Date   CARDIOVASCULAR STRESS TEST  07/17/2009   EF 59%   CARDIOVERSION     DISTAL BICEPS TENDON REPAIR Right 2011   INGUINAL HERNIA REPAIR Right 1992   INGUINAL HERNIA REPAIR Left 2013   INSERTION OF MESH N/A 05/14/2015   Procedure: INSERTION OF MESH;  Surgeon: Jackolyn Confer, MD;  Location: [facility];  Service: General;  Laterality: N/A;   SHOULDER ARTHROSCOPY W/ ROTATOR CUFF REPAIR Left 09/04/2012   SHOULDER SURGERY     left   TOTAL HIP ARTHROPLASTY Right 11/26/2014   Procedure: RIGHT TOTAL HIP ARTHROPLASTY ANTERIOR APPROACH;  Surgeon: Gaynelle Arabian, MD;  Location: [facility];  Service: Orthopedics;  Laterality: Right;   TOTAL HIP ARTHROPLASTY Left 01/30/2019   Procedure: TOTAL HIP ARTHROPLASTY ANTERIOR APPROACH;  Surgeon: Gaynelle Arabian, MD;  [facility]: [facility];  Service: Orthopedics;  Laterality: Left;  134min   TRANSTHORACIC ECHOCARDIOGRAM  03/05/2010   EF [id number]   UMBILICAL HERNIA REPAIR N/A 05/14/2015   Procedure: HERNIA REPAIR UMBILICAL ADULT WITH MESH ;  Surgeon: Jackolyn Confer, MD;  [facility];  Service:  General;  Laterality: N/A;   WISDOM TOOTH EXTRACTION     lower 2     Medications: Current Outpatient Medications  Medication Sig Dispense Refill   ALPRAZolam (XANAX) 0.25 MG tablet Take 1 tablet by mouth 2 (two) times daily.   4   Evolocumab (REPATHA SURECLICK) [id number] MG/ML SOAJ INJECT 1 PEN INTO THE SKIN EVERY 14 DAYS 6 mL 3   ezetimibe (ZETIA) 10 MG tablet Take 1 tablet (10 mg total) by mouth daily. **PATIENT NEEDS APT FOR FURTHER REFILLS** 30 tablet 0   lisinopril (ZESTRIL) 20 MG tablet Take 20 mg by mouth daily.     metoprolol succinate (TOPROL-XL) 25 MG 24 hr tablet TAKE 1 TABLET BY MOUTH EACH MORNING 90 tablet 1   omeprazole (PRILOSEC) 20 MG capsule Take 1 capsule (20 mg total) by mouth daily. 30 capsule 0   propafenone (RYTHMOL SR) 325 MG 12 hr capsule TAKE 1 CAPSULE BY MOUTH TWICE DAILY 180 capsule 3   SYNTHROID 150 MCG tablet Take 300 mcg by mouth daily.   0   tamsulosin (FLOMAX) 0.4 MG CAPS capsule Take 0.4 mg by mouth every evening.      XARELTO 20 MG TABS tablet TAKE 1 TABLET BY MOUTH DAILY 90 tablet 1   No current facility-administered medications for this visit.    Allergies: Allergies  Allergen Reactions   Crestor [Rosuvastatin Calcium] Other (See Comments)    hepatitis   Lipitor [Atorvastatin Calcium] Other (See Comments)    Feels like someone hit him with  2x4    Social History: The patient  reports that he has never smoked. He has never used smokeless tobacco. He reports current alcohol use of about 4.0 - 8.0 standard drinks per week. He reports that he does not use drugs.   Family History: The patient's family history includes Cancer in his brother and mother; Heart attack in his maternal grandfather; Hyperlipidemia in his mother; Hypertension in his brother, father, and mother.   Review of Systems: Please see the history of present illness.   Otherwise, the review of systems is positive for none.   All other systems are reviewed and negative.   Physical  Exam: Blood pressure 138/82, pulse 73, height 6\' 3"  (1.905 m), weight 267 lb 9.6 oz (121.4 kg), SpO2 97 %.  GEN:  Well nourished, well developed in no acute distress HEENT: Normal NECK: No JVD; No carotid bruits LYMPHATICS: No lymphadenopathy CARDIAC: RRR , no murmurs, rubs, gallops RESPIRATORY:  Clear to auscultation without rales, wheezing or rhonchi  ABDOMEN: Soft, non-tender, non-distended MUSCULOSKELETAL:   trace ankle edema No deformity  SKIN: Warm and dry NEUROLOGIC:  Alert and oriented x 3   LABORATORY DATA:  EKG:       Lab Results  Component Value Date   WBC 5.2 11/05/2020   HGB 14.7 11/05/2020   HCT 46.0 11/05/2020   PLT 200 11/05/2020   GLUCOSE 72 11/05/2020   CHOL 128 10/01/2019   TRIG 63 10/01/2019   HDL 49 10/01/2019   LDLCALC 66 10/01/2019   ALT 27 11/05/2020   AST 28 11/05/2020   NA 140 11/05/2020   K 4.0 11/05/2020   CL 104 11/05/2020   CREATININE 1.27 11/05/2020   BUN 17 11/05/2020   CO2 28 11/05/2020   TSH 0.626 10/01/2019   PSA 3.69 10/05/2016   INR 1.1 01/29/2019   HGBA1C 5.9 (H) 01/25/2019    BNP (last 3 results) No results for input(s): BNP in the last 8760 hours.  ProBNP (last 3 results) No results for input(s): PROBNP in the last 8760 hours.   Other Studies Reviewed Today:  Echo Study Conclusions from 2014  Left ventricle: The cavity size was normal. Wall thickness was increased in a pattern of mild LVH. Systolic function was normal. The estimated ejection fraction was in the range of 55% to 60%. There was an increased relative contribution of atrial contraction to ventricular filling.   Myoview Impression from 2014 Exercise Capacity:  Good exercise capacity. BP Response:  Normal blood pressure response. Clinical Symptoms:  Mild dyspnea ECG Impression:  No significant ST segment change suggestive of ischemia. Comparison with Prior Nuclear Study: No significant change from previous study  Overall Impression:  Low risk  stress nuclear study.  No evidence for ischemia or infarction.  EF mildly decreased.   LV Ejection Fraction: 53%.  LV Wall Motion:  Mild global hypokinesis.   ECG:      Assessment/Plan:  1. -  leg edema - has trace leg edema .   Just a cosmetic issue.  Will improve with more exercise, better diet, weight loss    2.  Hypercoagulable syndrome :    cont xarelto    3.  Paroxysmal atrial fibrillation,   cont Propafenone    4.  Hypercholesterolemia -    check lipids in 4 months    5.  Essential hypertension -     BP is well controlled.    6.  Hypothyroidism:   7.  Osteoarthritis of right hip.   Marland Kitchen  Current medicines are reviewed with the patient today.  The patient does not have concerns regarding medicines other than what has been noted above.    Will see him in 6 months .   Kristeen Miss, MD  12/21/2020 5:08 PM    [facility] [address] [place],  [address] [place], [place]  [address] Pager [phone] Phone: [phone]; Fax: [phone]

## 2020-12-25 ENCOUNTER — Other Ambulatory Visit: Payer: Self-pay | Admitting: Cardiovascular Disease

## 2020-12-25 DIAGNOSIS — I10 Essential (primary) hypertension: Secondary | ICD-10-CM

## 2020-12-25 DIAGNOSIS — N183 Chronic kidney disease, stage 3 unspecified: Secondary | ICD-10-CM

## 2021-01-27 IMAGING — RF DG HIP (WITH PELVIS) OPERATIVE*L*
1 series · 4 of 4 positions shown · non-contrast
Comparison: November 26, 2014.

CLINICAL DATA: Left hip replacement.

EXAM:
OPERATIVE left HIP (WITH PELVIS IF PERFORMED) 4 VIEWS
TECHNIQUE: Fluoroscopic spot image(s) were submitted for interpretation
post-operatively.
Radiation exposure index: 2.5606 mGy.

[Series 1: unknown protocol · 0.20mm/px · 4 of 4 slices shown]
[im 1/4]
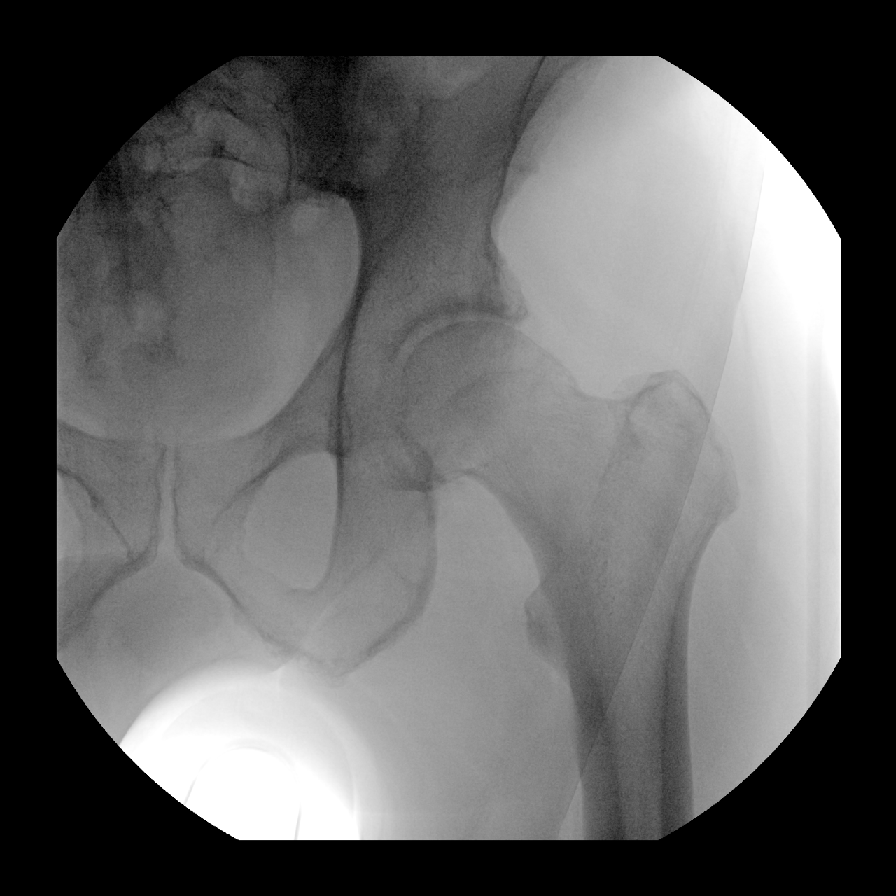
[im 2/4]
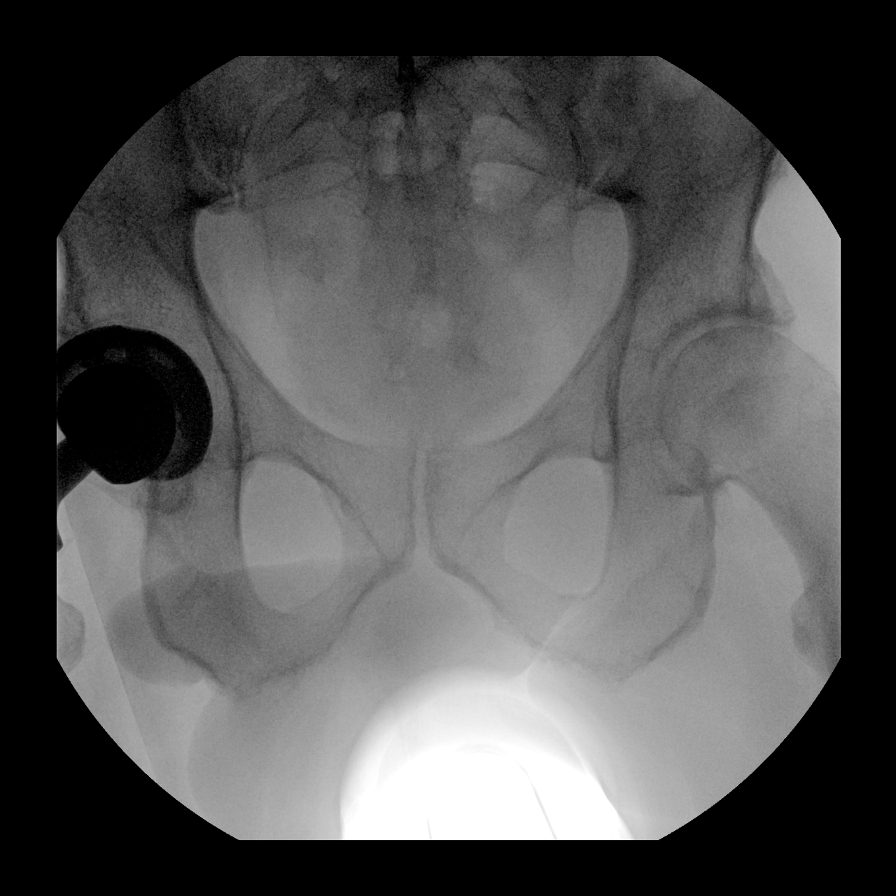
[im 3/4]
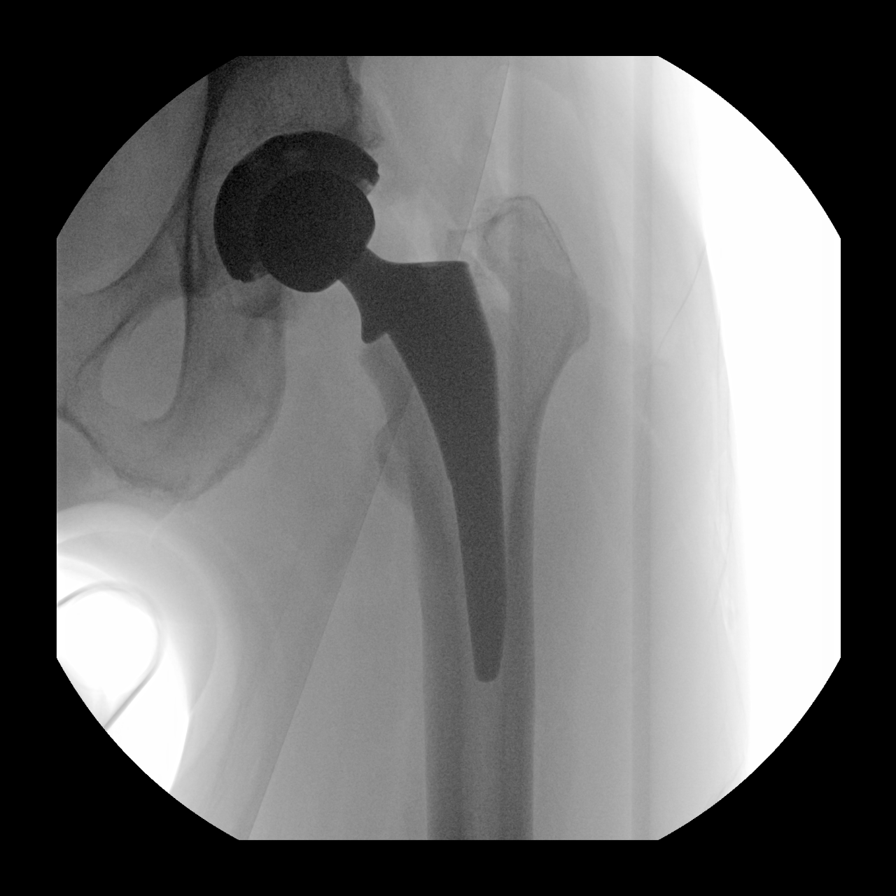
[im 4/4]
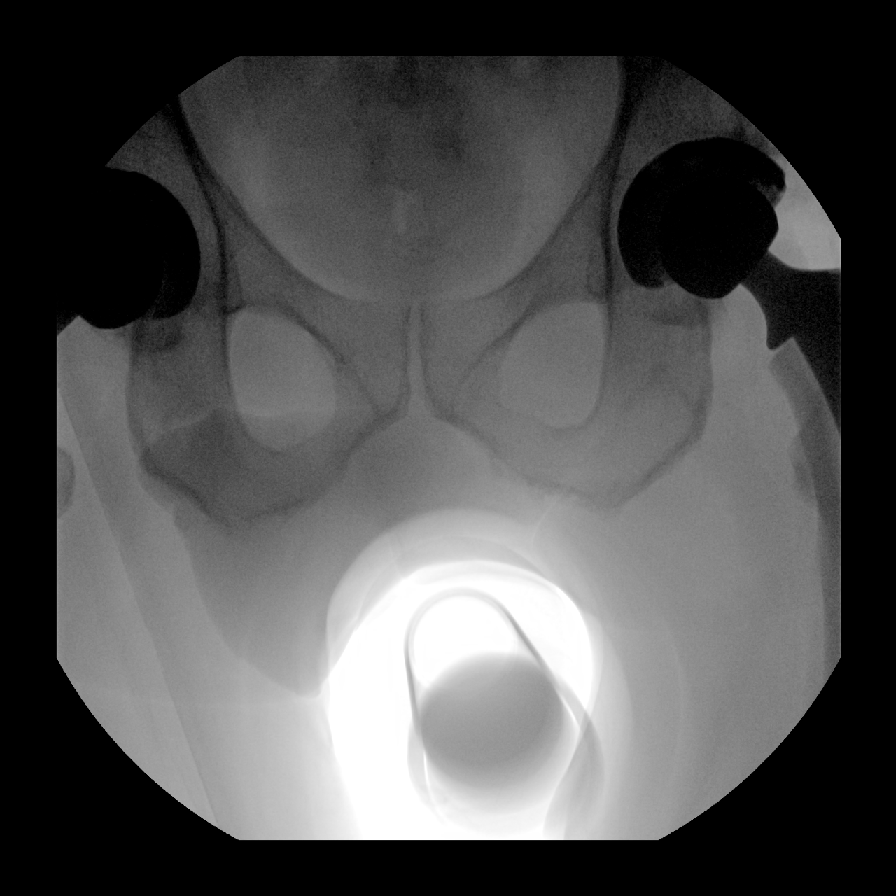

[4 of 4 positions shown; findings below may reference images not displayed]

FINDINGS: The left acetabular and femoral components are well situated.
IMPRESSION: Status post left total hip arthroplasty hip arthroplasty.

## 2021-03-30 ENCOUNTER — Other Ambulatory Visit: Payer: Self-pay | Admitting: Cardiovascular Disease

## 2021-04-08 ENCOUNTER — Other Ambulatory Visit: Payer: Self-pay | Admitting: Cardiovascular Disease

## 2021-04-09 NOTE — Telephone Encounter (Signed)
Xarelto 20 mg refill request received. Pt is 61 years old, weight- 121.4 kg, Crea- 1.27 on 11/05/20, last seen by Dr. Elease Hashimoto on 12/21/20, Diagnosis- PAF, CrCl- 107.54; Dose is appropriate based on dosing criteria. Will send in refill to requested pharmacy.    ?

## 2021-04-12 DIAGNOSIS — R7302 Impaired glucose tolerance (oral): Secondary | ICD-10-CM | POA: Diagnosis not present

## 2021-04-12 DIAGNOSIS — N1831 Chronic kidney disease, stage 3a: Secondary | ICD-10-CM | POA: Diagnosis not present

## 2021-04-12 DIAGNOSIS — E039 Hypothyroidism, unspecified: Secondary | ICD-10-CM | POA: Diagnosis not present

## 2021-04-20 DIAGNOSIS — N400 Enlarged prostate without lower urinary tract symptoms: Secondary | ICD-10-CM | POA: Diagnosis not present

## 2021-04-20 DIAGNOSIS — Z125 Encounter for screening for malignant neoplasm of prostate: Secondary | ICD-10-CM | POA: Diagnosis not present

## 2021-04-30 ENCOUNTER — Other Ambulatory Visit: Payer: BC Managed Care – PPO

## 2021-04-30 ENCOUNTER — Ambulatory Visit: Payer: BC Managed Care – PPO | Admitting: Cardiovascular Disease

## 2021-04-30 ENCOUNTER — Encounter: Payer: Self-pay | Admitting: Cardiovascular Disease

## 2021-04-30 VITALS — BP 128/80 | HR 67 | Ht 75.5 in | Wt 236.6 lb

## 2021-04-30 DIAGNOSIS — I480 Paroxysmal atrial fibrillation: Secondary | ICD-10-CM

## 2021-04-30 DIAGNOSIS — I10 Essential (primary) hypertension: Secondary | ICD-10-CM

## 2021-04-30 DIAGNOSIS — E782 Mixed hyperlipidemia: Secondary | ICD-10-CM

## 2021-04-30 LAB — BASIC METABOLIC PANEL
BUN/Creatinine Ratio: 10 (ref 9–20)
BUN: 15 mg/dL (ref 6–24)
CO2: 25 mmol/L (ref 20–29)
Calcium: 9.2 mg/dL (ref 8.7–10.2)
Chloride: 101 mmol/L (ref 96–106)
Creatinine, Ser: 1.49 mg/dL — ABNORMAL HIGH (ref 0.76–1.27)
Glucose: 97 mg/dL (ref 70–99)
Potassium: 4.6 mmol/L (ref 3.5–5.2)
Sodium: 137 mmol/L (ref 134–144)
eGFR: 54 mL/min/{1.73_m2} — ABNORMAL LOW (ref >59)

## 2021-04-30 LAB — MAGNESIUM: Magnesium: 2.3 mg/dL (ref 1.6–2.3)

## 2021-04-30 NOTE — Patient Instructions (Signed)
Medication Instructions:  ?Your physician recommends that you continue on your current medications as directed. Please refer to the Current Medication list given to you today. ? ?*If you need a refill on your cardiac medications before your next appointment, please call your pharmacy* ? ?Lab Work: ?TODAY: BMP, Magnesium ?If you have labs (blood work) drawn today and your tests are completely normal, you will receive your results only by: ?MyChart Message (if you have MyChart) OR ?A paper copy in the mail ?If you have any lab test that is abnormal or we need to change your treatment, we will call you to review the results. ? ?Testing/Procedures: ?NONE ? ?Follow-Up: ?At [facility], you and your health needs are our priority.  As part of our continuing mission to provide you with exceptional heart care, we have created designated Provider Care Teams.  These Care Teams include your primary Cardiologist (physician) and Advanced Practice Providers (APPs -  Physician Assistants and Nurse Practitioners) who all work together to provide you with the care you need, when you need it. ? ?Your next appointment:   ?6 month(s) ? ?The format for your next appointment:   ?In Person ? ?Provider:   ?Kristeen Miss, MD  or Eligha Bridegroom, NP  ? ?Other Instructions ? ?Important Information About Sugar ? ? ? ? ?  ?

## 2021-04-30 NOTE — Progress Notes (Signed)
? ? ? ?CARDIOLOGY OFFICE NOTE ? ?Date:  05/04/2021  ? ? ?Isaiah [name], Isaiah [name] ?Date of Birth: [birth year] ?Medical Record [id number] ? ?PCP:  Reynold Bowen, MD  ?Cardiologist:  Former patient of Dr. Sherryl Barters.   Now Eleanna Theilen ? ?Chief Complaint  ?Patient presents with  ? Atrial Fibrillation  ?   ?  ? Hypertension  ?   ?  ? ?Problem list ?1. History of recurrent pulmonary embolus ?2. Hypercoagulability - Lupus anticoagulant  ?3. Hypertension ?4. Osteoarthritis-status post hip replacements ?5.  Paroxysmal atrial fibrillation ?6. Lyme disease - 2014  ? ? ?Isaiah [name], Isaiah [name] is a 61 y.o. male who presents today for a 4 month check. Former patient of Dr. Sherryl Barters.   ?He has a history of prior PE and found to have a hypercoagulability state with abnormal lupus anticoagulant - he is committed to lifelong anticoagulation and is on Xarelto. He has had frequent DVT, PAF, hypothyroidism, HLD and HTN.  He has sleep apnea and is on CPAP - followed by Dr. Radford Pax.  ? ?In January 2014 the patient experienced some chest tightness and underwent a treadmill Myoview stress test which showed no evidence of ischemia and his ejection fraction was 53% with no wall motion abnormalities.  ? ?Last seen in November. Cardiac status was stable.  ? ?Comes back today. Here alone. He is doing well. He had his hip replacement back in November - did well. Back on track with losing weight - says he is down 20 pounds. BP is lower. No chest pain. Breathing is good. Rhythm is good. Asking about reduction of medicines. His significant other has been diagnosed with breast cancer and he has been helping with her.   ? ?June 19, 2015: ? ?Isaiah [name] is seen today for initial visit.   He is a former patient of [facility]. ?Has hx of PAF - none in the past 6-8 years.    Seems to be triggered by large doses of caffiene.  ?Has cardioverted on 1 occasion but usually he will convert back into NSR after a day or so .  ? 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 ?Has had  some surgeries recently but is back to walking .   No weight lifting due to a recent hernia repair .  ? ?Dec. 1, 2017: ? ?Isaiah [name] is seen today in follow-up for his paroxysmal atrial fib ablation, diastolic dysfunction, and hypertension. ?No palps ? ?June 21, 2016: ?Still very active.  ?Used to lift heavy weight and fight MMA . ?Does cardio regularly. 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   ? ?No episodes of atrial fib - none in the past 6-8 years  ?Has hx of DVT ,  He walks frequently when flying  ? ?August 04, 2016: ? ?Isaiah [name] is seen today for work in visit. He started having episodes of chest pain last week. ? ?He went on vacation a week ago ?Ate too much salt, ?Came home ,  Took Lasix for swelling  ?Had a non productive cough.   Chest tightness.   ?Pressure like sensation ?No pleuretic CP,  Difficult to take a deep breath ?No fever , dry cough.  ?Felt a bit better after Z-pac.  But chest pressure persisted.  ?Feels different from his pulmonary embolus pain  ? ?Echo in 2014 showed normal LV function  ? ?Sept. 12, 2018: ? ?Isaiah [name] is seen back after having an episode of chest pain  ?Coronary CT angio shows a  prox LAD stenosis of 30%.  ?Calcium score of 44 ( 72 percentile for age )  ?On Zetia,  Had problems with crestor ( elevated liver enzymes)  and Lipitor ( profound muscle aches )  .  ? ?Feeling better.  Still working out . No CP  ? ?May 19, 2017:   ?Doing well. ?Went on [employer] and lost 20 lbs.   ?Feeling well .  ?Has not had any recurrent PAF  - last episode was 10 years ago  ? ?November 17, 2017:  ?Isaiah [name] is seen today for follow-up of his paroxysmal atrial fibrillation.  He also has a hypercoagulable state and is on Xarelto. ?Is a history of very mild coronary artery disease.  Coronary CT angiogram showed a proximal LAD stenosis of approximately 30%.  Coronary calcium score is 44.  He is on Zetia and Repatha. ?Very busy at work .   Has not been working out as much .  ?Has OSA - wears his CPAP  ,  Seems to be working well  ? ?March 29, 2019: ? ?Isaiah [name] is seen today for follow-up of his paroxysmal atrial fibrillation and hypercoagulable state.  He remains on Xarelto.  He has very mild coronary artery disease.  Coronary CT angiogram showed a proximal stenosis of approximately 30%.  His coronary calcium score is 44.  Remains on Zetia and Repatha. ?Had a hip replacement this past year  ?No covid  ? ?Sept. 14, 2021: ? ?Isaiah [name] is seen today for paroxysmal atrial fibrillation.  He has a hypercoagulable state and has not had a pulmonary embolus in the past.  He is on Xarelto.  CT angiogram reveals a proximal LAD stenosis of approximately 30%.  His coronary calcium score is 44.  He remains on Zetia and Repatha. ? ?Is getting over covid pneumonia  ?Got back from a motorcycle trip up in [place].  ?Became very short of breath ?CT angio rules out PE,    ?Worked all week.  Spiked a temp a week later.   Thinks he may have caught covid in the ER  ?+ night sweats ,  Went to [employer] . Took omnicef, albuterol , monoclonal antibodies ?Is better now  ?Still thinks he feels something in his chest  ?O2 sats are normal .  ?No pleuretic CP,  ?Staying hydrated.  ? ?Nov. 18, 2021 ?Isaiah [name] is seen today for follow up of his HTN, PAF,  Hx of DVT  ?No cp. ?Has some DOE if he climbs several flights of stairs  ? ?March 23, 2020: ?Isaiah [name] is seen today for follow up of his HTN, PAF, hx of DVT. ?Reported some increasing DOE at his last visit ?Had a chest CT angiogram in Aug. 2021 which was negative for PE  ?Is down 20 lbs ,  BP looks great  ? ? ?Dec. 5, 2022 ?Isaiah [name] is seen for follow up of his HTN, PAF, hx of CVT ?Had ventral hernia repair since I last saw him ?Has had back surgery . ?Slow recovery  ?Has lots of back spasm ? ?Getting some ankle edema .   Is eating more salt. ?Less exercise . ? ?April 30, 2021 ?Isaiah [name] is seen today for follow up of his HTN, PAF, DVT, Lupus anticoagulant  ?Has had 5 back surgeries last year.   ?Didn't bridge , developed a small DVT in his left leg  ? ?I recommend that he have bridging with Lovenox when he needs future surgeries.  ? ?Back is getting better  ?Back  riding his motorcycle ?Working out some .  ?Wt is 263 lbs  ? ?Past Medical History:  ?Diagnosis Date  ? Arthritis   ? Blood dyscrasia   ? lupus anticoagulant  ? Chronic kidney disease   ? stage 2  ? Diastolic dysfunction   ? Grade I per echo in 2/12  ? DVT (deep venous thrombosis) ([place]) 2000's  ? "both legs in the calves; left went all the way up to my groin" (09/17/2012)  ? Dysrhythmia   ? GERD (gastroesophageal reflux disease)   ? History of bronchitis   ? Hypercoagulation syndrome ([place])   ? secondary to circulating lupus anticoagulant  ? Hyperlipidemia   ? Hypertension   ? Hypothyroidism   ? "wiped out from taking amiodarone" (09/17/2012)  ? Lyme disease   ? history of   ? Obesity (BMI 30-39.9) 07/17/2014  ? PAF (paroxysmal atrial fibrillation) ([place])   ? no recurrence since Feb 2012  ? Pneumonia   ? PONV (postoperative nausea and vomiting)   ? likes scopolomanine patch  ? Pulmonary embolism ([place]) 09/17/2012  ? "just today; 3 on the right; 2 on the left" (09/17/2012)  ? Situational stress   ? Sleep apnea   ? uses CPAP  ? ? ?Past Surgical History:  ?Procedure Laterality Date  ? CARDIOVASCULAR STRESS TEST  07/17/2009  ? EF 59%  ? CARDIOVERSION    ? DISTAL BICEPS TENDON REPAIR Right 2011  ? INGUINAL HERNIA REPAIR Right 1992  ? INGUINAL HERNIA REPAIR Left 2013  ? INSERTION OF MESH N/A 05/14/2015  ? Procedure: INSERTION OF MESH;  Surgeon: Jackolyn Confer, MD;  Location: [facility];  Service: General;  Laterality: N/A;  ? SHOULDER ARTHROSCOPY W/ ROTATOR CUFF REPAIR Left 09/04/2012  ? SHOULDER SURGERY    ? left  ? TOTAL HIP ARTHROPLASTY Right 11/26/2014  ? Procedure: RIGHT TOTAL HIP ARTHROPLASTY ANTERIOR APPROACH;  Surgeon: Gaynelle Arabian, MD;  [facility];  Service: Orthopedics;  Laterality: Right;  ? TOTAL HIP ARTHROPLASTY Left 01/30/2019  ? Procedure: TOTAL HIP  ARTHROPLASTY ANTERIOR APPROACH;  Surgeon: Gaynelle Arabian, MD;  [facility]: [facility];  Service: Orthopedics;  Laterality: Left;  171min  ? TRANSTHORACIC ECHOCARDIOGRAM  03/05/2010  ? EF 60-65%  ? UMBILICAL HERN

## 2021-05-12 DIAGNOSIS — D6862 Lupus anticoagulant syndrome: Secondary | ICD-10-CM | POA: Insufficient documentation

## 2021-05-12 DIAGNOSIS — M6208 Separation of muscle (nontraumatic), other site: Secondary | ICD-10-CM | POA: Diagnosis not present

## 2021-05-12 DIAGNOSIS — M5126 Other intervertebral disc displacement, lumbar region: Secondary | ICD-10-CM | POA: Insufficient documentation

## 2021-05-13 ENCOUNTER — Other Ambulatory Visit: Payer: Self-pay | Admitting: Urology

## 2021-05-13 ENCOUNTER — Other Ambulatory Visit (HOSPITAL_COMMUNITY): Payer: Self-pay | Admitting: Urology

## 2021-05-13 DIAGNOSIS — Z125 Encounter for screening for malignant neoplasm of prostate: Secondary | ICD-10-CM

## 2021-05-13 DIAGNOSIS — R972 Elevated prostate specific antigen [PSA]: Secondary | ICD-10-CM

## 2021-05-24 ENCOUNTER — Ambulatory Visit (HOSPITAL_COMMUNITY)
Admission: RE | Admit: 2021-05-24 | Discharge: 2021-05-24 | Disposition: A | Discharge status: Home / Self Care | Payer: BC Managed Care – PPO | Source: Ambulatory Visit | Attending: Urology | Admitting: Urology

## 2021-05-24 DIAGNOSIS — N402 Nodular prostate without lower urinary tract symptoms: Secondary | ICD-10-CM | POA: Diagnosis not present

## 2021-05-24 DIAGNOSIS — Z125 Encounter for screening for malignant neoplasm of prostate: Secondary | ICD-10-CM | POA: Diagnosis not present

## 2021-05-24 DIAGNOSIS — R972 Elevated prostate specific antigen [PSA]: Secondary | ICD-10-CM | POA: Diagnosis not present

## 2021-05-24 MED ORDER — GADOBUTROL 1 MMOL/ML IV SOLN
10.0000 mL | Freq: Once | INTRAVENOUS | Status: AC | PRN
  Administered 2021-05-24: 10 mL via INTRAVENOUS

## 2021-06-08 ENCOUNTER — Other Ambulatory Visit: Payer: Self-pay | Admitting: Cardiovascular Disease

## 2021-06-08 DIAGNOSIS — M5430 Sciatica, unspecified side: Secondary | ICD-10-CM | POA: Diagnosis not present

## 2021-06-08 DIAGNOSIS — M545 Low back pain, unspecified: Secondary | ICD-10-CM | POA: Diagnosis not present

## 2021-06-08 DIAGNOSIS — G4733 Obstructive sleep apnea (adult) (pediatric): Secondary | ICD-10-CM | POA: Diagnosis not present

## 2021-06-23 DIAGNOSIS — L304 Erythema intertrigo: Secondary | ICD-10-CM | POA: Diagnosis not present

## 2021-06-23 DIAGNOSIS — L738 Other specified follicular disorders: Secondary | ICD-10-CM | POA: Diagnosis not present

## 2021-06-23 DIAGNOSIS — M545 Low back pain, unspecified: Secondary | ICD-10-CM | POA: Diagnosis not present

## 2021-06-23 DIAGNOSIS — C44519 Basal cell carcinoma of skin of other part of trunk: Secondary | ICD-10-CM | POA: Diagnosis not present

## 2021-06-23 DIAGNOSIS — M5430 Sciatica, unspecified side: Secondary | ICD-10-CM | POA: Diagnosis not present

## 2021-06-23 DIAGNOSIS — Z85828 Personal history of other malignant neoplasm of skin: Secondary | ICD-10-CM | POA: Diagnosis not present

## 2021-06-23 DIAGNOSIS — L0889 Other specified local infections of the skin and subcutaneous tissue: Secondary | ICD-10-CM | POA: Diagnosis not present

## 2021-07-05 DIAGNOSIS — R059 Cough, unspecified: Secondary | ICD-10-CM | POA: Diagnosis not present

## 2021-07-05 DIAGNOSIS — R0989 Other specified symptoms and signs involving the circulatory and respiratory systems: Secondary | ICD-10-CM | POA: Diagnosis not present

## 2021-07-05 DIAGNOSIS — M545 Low back pain, unspecified: Secondary | ICD-10-CM | POA: Diagnosis not present

## 2021-07-05 DIAGNOSIS — K219 Gastro-esophageal reflux disease without esophagitis: Secondary | ICD-10-CM | POA: Diagnosis not present

## 2021-07-07 ENCOUNTER — Other Ambulatory Visit: Payer: Self-pay | Admitting: Endocrinology

## 2021-07-07 DIAGNOSIS — M5416 Radiculopathy, lumbar region: Secondary | ICD-10-CM

## 2021-07-09 ENCOUNTER — Ambulatory Visit
Admission: RE | Admit: 2021-07-09 | Discharge: 2021-07-09 | Disposition: A | Discharge status: Home / Self Care | Payer: BC Managed Care – PPO | Source: Ambulatory Visit | Attending: Endocrinology | Admitting: Endocrinology

## 2021-07-09 DIAGNOSIS — G4733 Obstructive sleep apnea (adult) (pediatric): Secondary | ICD-10-CM | POA: Diagnosis not present

## 2021-07-09 DIAGNOSIS — M5126 Other intervertebral disc displacement, lumbar region: Secondary | ICD-10-CM | POA: Diagnosis not present

## 2021-07-09 DIAGNOSIS — M5416 Radiculopathy, lumbar region: Secondary | ICD-10-CM

## 2021-07-09 DIAGNOSIS — M48061 Spinal stenosis, lumbar region without neurogenic claudication: Secondary | ICD-10-CM | POA: Diagnosis not present

## 2021-07-09 DIAGNOSIS — M47816 Spondylosis without myelopathy or radiculopathy, lumbar region: Secondary | ICD-10-CM | POA: Diagnosis not present

## 2021-07-09 MED ORDER — GADOBENATE DIMEGLUMINE 529 MG/ML IV SOLN
20.0000 mL | Freq: Once | INTRAVENOUS | Status: AC | PRN
  Administered 2021-07-09: 20 mL via INTRAVENOUS

## 2021-07-14 DIAGNOSIS — M545 Low back pain, unspecified: Secondary | ICD-10-CM | POA: Diagnosis not present

## 2021-07-21 ENCOUNTER — Other Ambulatory Visit: Payer: Self-pay | Admitting: Cardiovascular Disease

## 2021-07-21 DIAGNOSIS — E782 Mixed hyperlipidemia: Secondary | ICD-10-CM

## 2021-07-26 DIAGNOSIS — R7302 Impaired glucose tolerance (oral): Secondary | ICD-10-CM | POA: Diagnosis not present

## 2021-07-26 DIAGNOSIS — E785 Hyperlipidemia, unspecified: Secondary | ICD-10-CM | POA: Diagnosis not present

## 2021-07-26 DIAGNOSIS — E039 Hypothyroidism, unspecified: Secondary | ICD-10-CM | POA: Diagnosis not present

## 2021-07-26 DIAGNOSIS — M549 Dorsalgia, unspecified: Secondary | ICD-10-CM | POA: Diagnosis not present

## 2021-07-26 DIAGNOSIS — M47896 Other spondylosis, lumbar region: Secondary | ICD-10-CM | POA: Diagnosis not present

## 2021-08-08 DIAGNOSIS — G4733 Obstructive sleep apnea (adult) (pediatric): Secondary | ICD-10-CM | POA: Diagnosis not present

## 2021-08-15 IMAGING — CT CT ANGIO CHEST
2 of 6 series · 18 of 36 positions shown · IV contrast (omnipaque)
Comparison: September 17, 2012

CLINICAL DATA: Shortness of breath history of pulmonary embolism

EXAM:
CT ANGIOGRAPHY CHEST WITH CONTRAST
TECHNIQUE: Multidetector CT imaging of the chest was performed using the
standard protocol during bolus administration of intravenous
contrast. Multiplanar CT image reconstructions and MIPs were
obtained to evaluate the vascular anatomy.
CONTRAST:  75mL OMNIPAQUE IOHEXOL 350 MG/ML SOLN

[Series 7: pe thins · axial · 0.86mm/px · z∈[+1098,+1374]mm · 17 of 208 slices shown]
[im 12/208  lung]
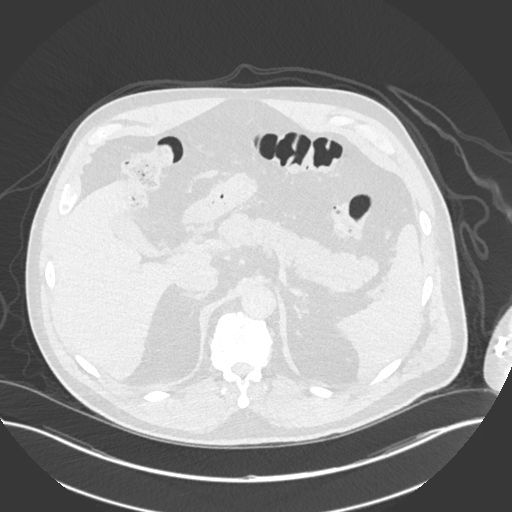
[im 24/208  mediastinal]
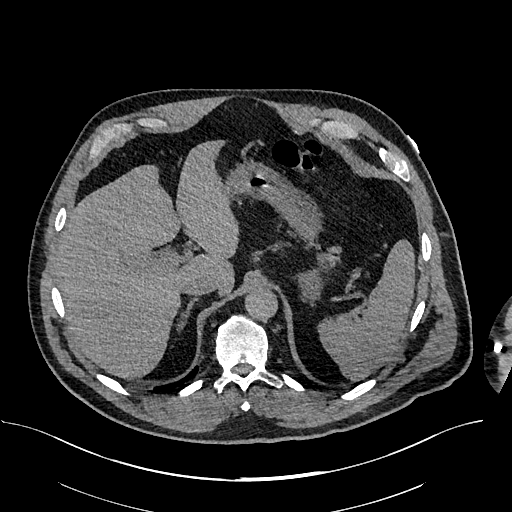
[im 35/208  lung]
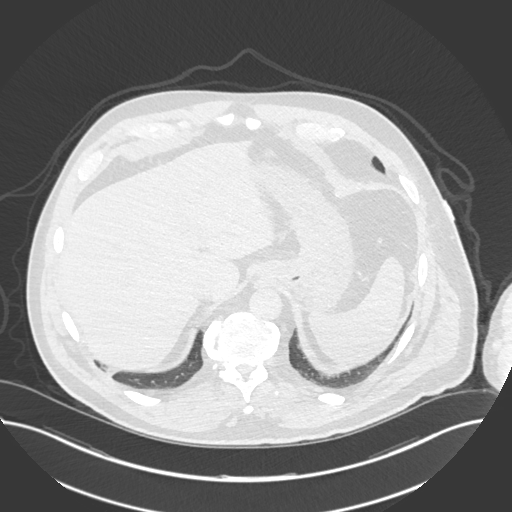
[im 47/208  mediastinal]
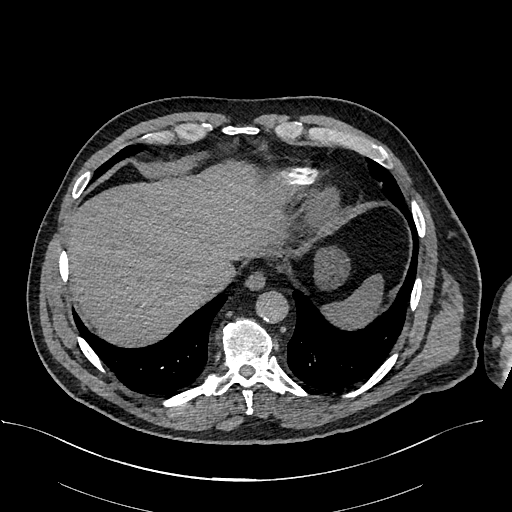
[im 58/208  lung]
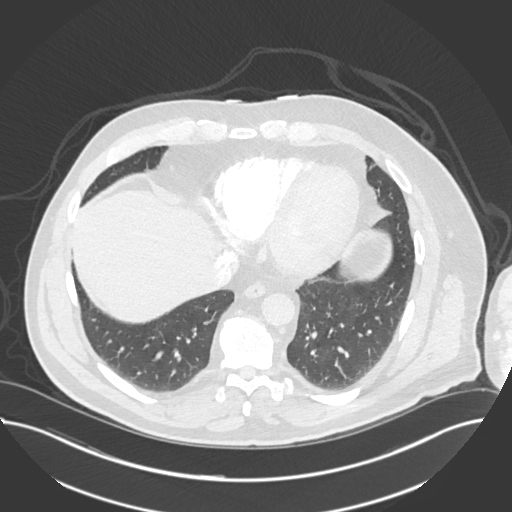
[im 70/208  mediastinal]
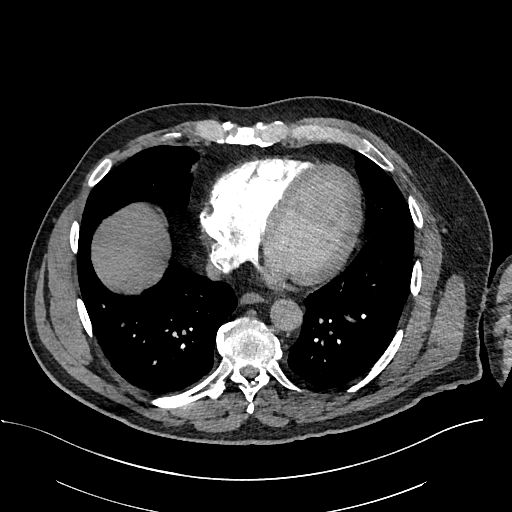
[im 81/208  lung]
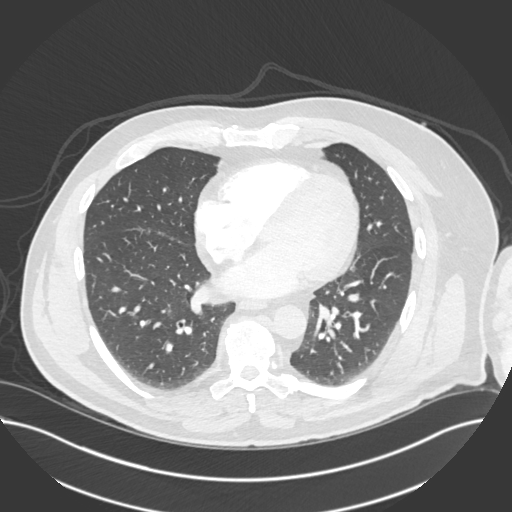
[im 93/208  mediastinal]
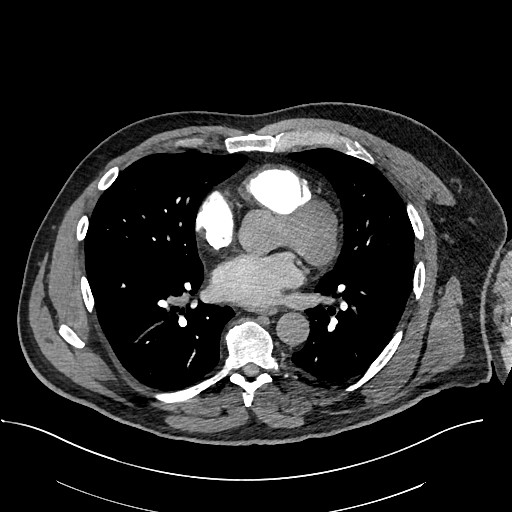
[im 104/208  lung]
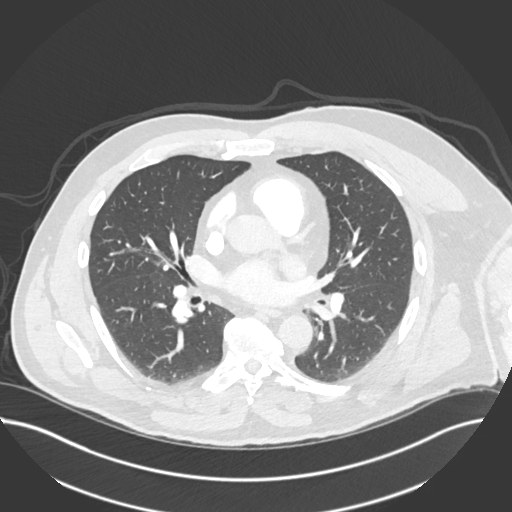
[im 116/208  mediastinal]
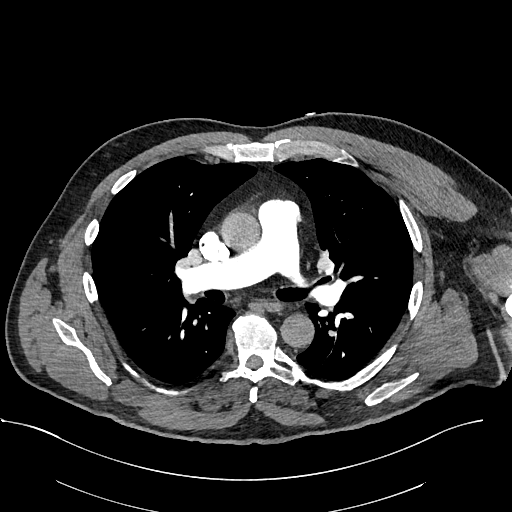
[im 127/208  lung]
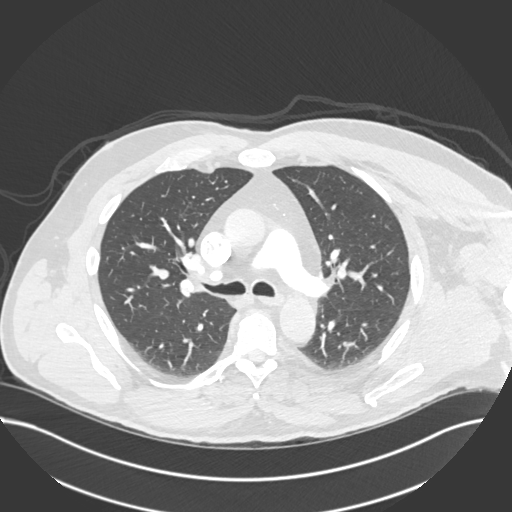
[im 139/208  mediastinal]
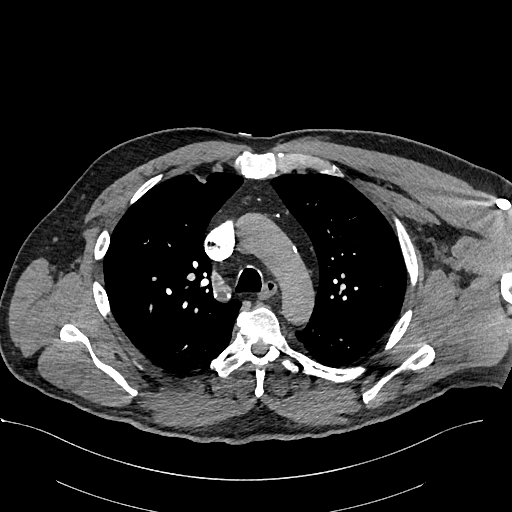
[im 150/208  lung]
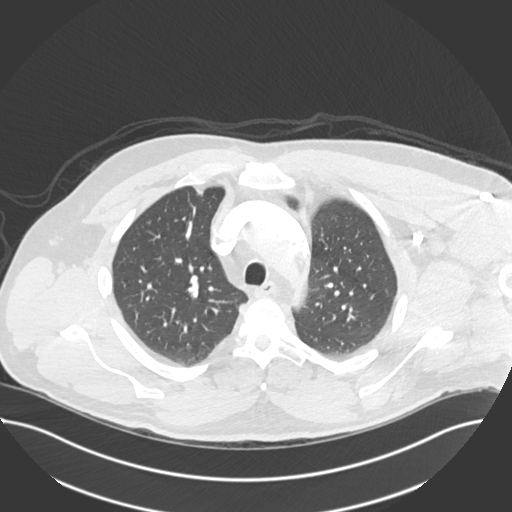
[im 162/208  mediastinal]
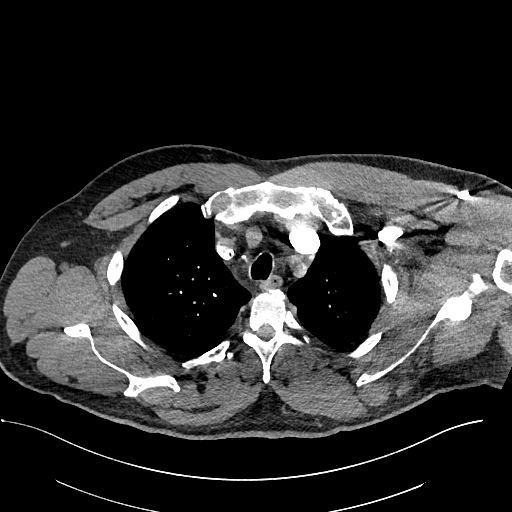
[im 173/208  lung]
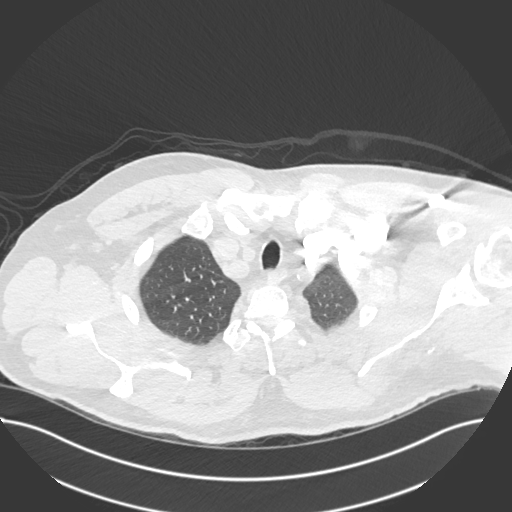
[im 185/208  mediastinal]
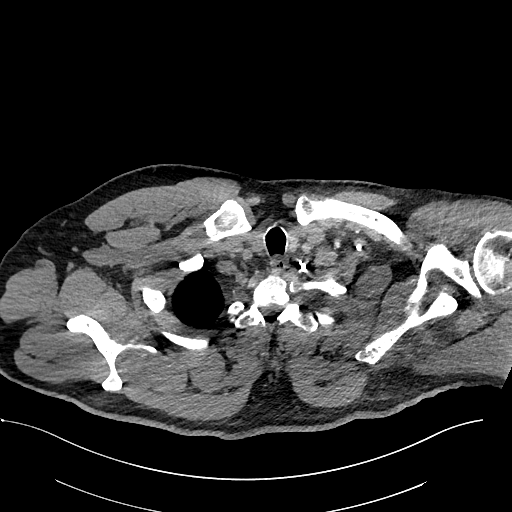
[im 196/208  lung]
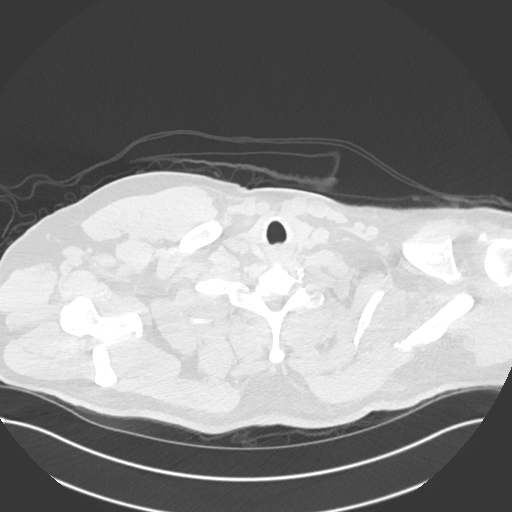

[Series 8: pe 2mm cor · coronal · 0.61mm/px · 1 of 101 slices shown]
[im 51/101  mediastinal]
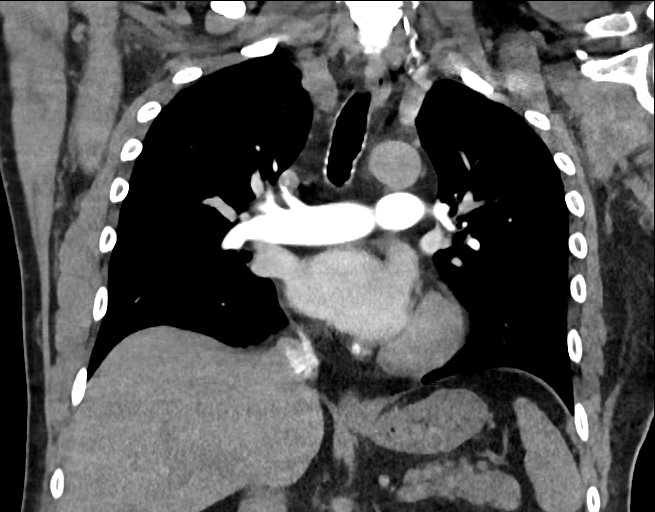

[18 of 36 positions shown; findings below may reference images not displayed]

FINDINGS: Cardiovascular: There is a optimal opacification of the pulmonary
arteries. There is no central,segmental, or subsegmental filling
defects within the pulmonary arteries. The heart is normal in size.
No pericardial effusion or thickening. No evidence right heart
strain. There is normal three-vessel brachiocephalic anatomy without
proximal stenosis. Minimal coronary artery calcifications are seen.
There is scattered aortic atherosclerosis.

Mediastinum/Nodes: No hilar, mediastinal, or axillary adenopathy.
Thyroid gland, trachea, and esophagus demonstrate no significant
findings.

Lungs/Pleura: The lungs are clear. No pleural effusion or
pneumothorax. No airspace consolidation.

Upper Abdomen: No acute abnormalities present in the visualized
portions of the upper abdomen.

Musculoskeletal: No chest wall abnormality. No acute or significant
osseous findings. Degenerative changes are seen in midthoracic
spine.

Review of the MIP images confirms the above findings.
IMPRESSION: No central, segmental, or subsegmental pulmonary embolism.

No acute intrathoracic pathology to explain the patient's symptoms.

Aortic Atherosclerosis ([id number]).

## 2021-10-01 DIAGNOSIS — N400 Enlarged prostate without lower urinary tract symptoms: Secondary | ICD-10-CM | POA: Diagnosis not present

## 2021-10-06 DIAGNOSIS — N401 Enlarged prostate with lower urinary tract symptoms: Secondary | ICD-10-CM | POA: Diagnosis not present

## 2021-10-06 DIAGNOSIS — R3916 Straining to void: Secondary | ICD-10-CM | POA: Diagnosis not present

## 2021-10-06 DIAGNOSIS — R3912 Poor urinary stream: Secondary | ICD-10-CM | POA: Diagnosis not present

## 2021-10-07 DIAGNOSIS — G4733 Obstructive sleep apnea (adult) (pediatric): Secondary | ICD-10-CM | POA: Diagnosis not present

## 2021-10-09 DIAGNOSIS — G4733 Obstructive sleep apnea (adult) (pediatric): Secondary | ICD-10-CM | POA: Diagnosis not present

## 2021-10-12 ENCOUNTER — Other Ambulatory Visit: Payer: Self-pay | Admitting: Cardiovascular Disease

## 2021-10-12 DIAGNOSIS — I480 Paroxysmal atrial fibrillation: Secondary | ICD-10-CM

## 2021-10-13 ENCOUNTER — Telehealth: Payer: Self-pay | Admitting: Pharmacist

## 2021-10-13 NOTE — Telephone Encounter (Signed)
Key: [id number] Renewal for Repatha PA submitted

## 2021-10-13 NOTE — Telephone Encounter (Signed)
Xarelto 20mg  refill request received. Pt is 61 years old, weight-107.3kg, Crea-1.49 on 04/30/2021, last seen by Dr. Acie Fredrickson on 04/30/2021, Diagnosis-Afib, CrCl-81.02 mL/min; Dose is appropriate based on dosing criteria. Will send in refill to requested pharmacy.

## 2021-10-26 ENCOUNTER — Telehealth: Payer: Self-pay | Admitting: Cardiovascular Disease

## 2021-10-26 NOTE — Telephone Encounter (Signed)
Left a message to call back.

## 2021-10-26 NOTE — Telephone Encounter (Signed)
Patient is returning call. He states he will be in and out of clinic with patients and would like a detailed voice message if he is unavailable when his call is returned.

## 2021-10-26 NOTE — Telephone Encounter (Signed)
Pt c/o BP issue: STAT if pt c/o blurred vision, one-sided weakness or slurred speech  1. What are your last 5 BP readings? 191/103 yesterday end of day  2. Are you having any other symptoms (ex. Dizziness, headache, blurred vision, passed out)? Headache  3. What is your BP issue? Patient states his BP has been getting high and will be going on a trip. He would like amlodipine 5 mg called in for him again due to his BP.

## 2021-10-27 MED ORDER — AMLODIPINE BESYLATE 5 MG PO TABS: 5.0000 mg | ORAL_TABLET | Freq: Every day | ORAL | 3 refills | Status: DC

## 2021-10-27 NOTE — Addendum Note (Signed)
Addended by: Ma Hillock on: 10/27/2021 10:27 AM   Modules accepted: Orders

## 2021-10-27 NOTE — Telephone Encounter (Signed)
Agree with refilling amlodipine 5 mg once daily for elevated BP. Please ask him to send [place] some BP readings after he returns from his trip so that we can ensure appropriate dosing of all of his medications.

## 2021-10-27 NOTE — Telephone Encounter (Signed)
Patient returned call to office. States that for the past week he's had a headache. He checked his BP evening of 10/25/21 and got 191/103. BP 10/10 was 156/97. States he took an extra dose of his Lisinopril and this morning it was 150/95, still headache. States he is leaving for [place] today for a continuing education conference and wants an rx sent in for Amlodipine 5mg  which he took in the past and says it helped his pressure. Explained that MD is out of office today and I may not have answer prior to him leaving. Pt asks that we send to Christen Bame, NP as well, and says "I'm trying to go through proper channels, but if not, I'll write it for myself." Routing to MD.

## 2021-10-27 NOTE — Telephone Encounter (Signed)
[name], Wonda Cheng, MD  You 51 minutes ago (9:33 AM)    Isaiah [name] is out today .  Ok to give a prescription for amlodipine 5 mg a day  Continue to check and record BP  Watch salt   Lets set up an appt in several weeks to evaluate this further   Thanks   PN    Returned call to patient and per his request, left detailed voicemail informing him that we have called in the medication and would like him to touch base with [place] after he returns from his trip to let us know how his BP has been trending.

## 2021-10-27 NOTE — Telephone Encounter (Signed)
Left detailed message per patient asking that he call back to provide BP log or timeframe of when BP began to rise. Last seen here in April this year and BP was great (128/80) and MAR at that time showed he was on Toprol XL 25 mg daily and Lisinopril 20mg . Asked for clarification that he's still on these meds as well. Waiting call back.

## 2021-11-06 DIAGNOSIS — G4733 Obstructive sleep apnea (adult) (pediatric): Secondary | ICD-10-CM | POA: Diagnosis not present

## 2021-11-09 ENCOUNTER — Other Ambulatory Visit: Payer: Self-pay | Admitting: Cardiovascular Disease

## 2021-11-16 DIAGNOSIS — Z85828 Personal history of other malignant neoplasm of skin: Secondary | ICD-10-CM | POA: Diagnosis not present

## 2021-11-16 DIAGNOSIS — L565 Disseminated superficial actinic porokeratosis (DSAP): Secondary | ICD-10-CM | POA: Diagnosis not present

## 2021-11-16 DIAGNOSIS — L570 Actinic keratosis: Secondary | ICD-10-CM | POA: Diagnosis not present

## 2021-11-16 DIAGNOSIS — L821 Other seborrheic keratosis: Secondary | ICD-10-CM | POA: Diagnosis not present

## 2021-11-16 DIAGNOSIS — L738 Other specified follicular disorders: Secondary | ICD-10-CM | POA: Diagnosis not present

## 2021-11-16 DIAGNOSIS — D0472 Carcinoma in situ of skin of left lower limb, including hip: Secondary | ICD-10-CM | POA: Diagnosis not present

## 2021-11-30 DIAGNOSIS — Z23 Encounter for immunization: Secondary | ICD-10-CM | POA: Diagnosis not present

## 2021-11-30 DIAGNOSIS — E039 Hypothyroidism, unspecified: Secondary | ICD-10-CM | POA: Diagnosis not present

## 2021-11-30 DIAGNOSIS — R7302 Impaired glucose tolerance (oral): Secondary | ICD-10-CM | POA: Diagnosis not present

## 2021-12-07 DIAGNOSIS — G4733 Obstructive sleep apnea (adult) (pediatric): Secondary | ICD-10-CM | POA: Diagnosis not present

## 2022-01-14 DIAGNOSIS — G4733 Obstructive sleep apnea (adult) (pediatric): Secondary | ICD-10-CM | POA: Diagnosis not present

## 2022-01-25 NOTE — Telephone Encounter (Signed)
Approved through 10/12/22.

## 2022-02-10 NOTE — Progress Notes (Signed)
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   Has been followed by cardiology for many years and has maintained consistent follow-up. He works as a Pharmacist, community and enjoys riding motorcycles.   Last cardiology clinic visit was with Dr. Acie [name] on 04/30/21 at which time he reported during a previous back surgery, he was not bridged with Lovenox and developed a small DVT in his left leg. Will recommend Lovenox with future surgeries. Maintained on propafenone for PAF as well as Toprol XL. He had stable labs and BP and was advised to return in 6 months for follow-up.   He contacted our office 10/26/2021 with elevated BP. Amlodipine 5 mg daily was added to lisinopril 20 mg and Toprol 25 daily. He was asked to send BP readings after starting amlodipine.  Today, he is here for follow-up. Reports he is doing much better since he has undergone multiple surgeries to alleviate back pain and is finally able to exercise again without significant pain. Received stem cell infusions x 2 in [place] recently. He denies chest pain, shortness of breath, lower extremity edema, fatigue, palpitations, melena, hematuria, hemoptysis, diaphoresis, weakness, presyncope, syncope, orthopnea, and PND. Is concerned about kidney function, would like [place] to recheck. No cardiac concerns.   Past Medical History:  Diagnosis Date   Arthritis    Blood dyscrasia     lupus anticoagulant   Chronic kidney disease    stage 2   Diastolic dysfunction    Grade I per echo in 2/12   DVT (deep venous thrombosis) ([facility]) 2000's   "both legs in the calves; left went all the way up to my groin" (09/17/2012)   Dysrhythmia    GERD (gastroesophageal reflux disease)    History of bronchitis    Hypercoagulation syndrome ([place])    secondary to circulating lupus anticoagulant   Hyperlipidemia    Hypertension    Hypothyroidism    "wiped out from taking amiodarone" (09/17/2012)   Lyme disease    history of    Obesity (BMI 30-39.9) 07/17/2014   PAF (paroxysmal atrial fibrillation) (HCC)    no recurrence since Feb 2012   Pneumonia    PONV (postoperative nausea and vomiting)    likes scopolomanine patch   Pulmonary embolism ([place]) 09/17/2012   "just today; 3 on the right; 2 on the left" (09/17/2012)   Situational stress    Sleep apnea    uses CPAP    Past Surgical History:  Procedure Laterality Date   CARDIOVASCULAR STRESS TEST  07/17/2009   EF 59%   CARDIOVERSION     DISTAL BICEPS TENDON REPAIR Right 2011   INGUINAL HERNIA REPAIR Right 1992   INGUINAL HERNIA REPAIR Left 2013   INSERTION OF MESH N/A 05/14/2015   Procedure: INSERTION OF MESH;  Surgeon: Isaiah Confer, MD;  Location: [facility];  Service: General;  Laterality: N/A;   SHOULDER ARTHROSCOPY W/ ROTATOR CUFF  REPAIR Left 09/04/2012   SHOULDER SURGERY     left   TOTAL HIP ARTHROPLASTY Right 11/26/2014   Procedure: RIGHT TOTAL HIP ARTHROPLASTY ANTERIOR APPROACH;  Surgeon: Isaiah Gross, MD;  Location: [facility];  Service: Orthopedics;  Laterality: Right;   TOTAL HIP ARTHROPLASTY Left 01/30/2019   Procedure: TOTAL HIP ARTHROPLASTY ANTERIOR APPROACH;  Surgeon: Isaiah Gross, MD;  Location: [facility];  Service: Orthopedics;  Laterality: Left;    TRANSTHORACIC ECHOCARDIOGRAM  03/05/2010   EF 60-65%   UMBILICAL HERNIA REPAIR N/A 05/14/2015   Procedure: HERNIA REPAIR UMBILICAL ADULT WITH MESH ;  Surgeon: Isaiah Peace, MD;  Location: [facility];  Service: General;  Laterality: N/A;   WISDOM TOOTH EXTRACTION     lower 2    Current Medications: Current Meds  Medication Sig   ALPRAZolam (XANAX) 0.25 MG tablet Take 1 tablet by mouth 2 (two) times daily as needed.   omega-3 acid ethyl esters (LOVAZA) 1 g capsule 2 tab po BID   omeprazole (PRILOSEC) 20 MG capsule Take 1 capsule (20 mg total) by mouth daily.   REPATHA SURECLICK 140 MG/ML SOAJ INJECT 1 PEN INTO THE SKIN EVERY 14 DAYS   SYNTHROID 150 MCG tablet Take 300 mcg by mouth daily.    tadalafil (CIALIS) 5 MG tablet Take 5 mg by mouth daily.   tamsulosin (FLOMAX) 0.4 MG CAPS capsule Take 0.4 mg by mouth every evening.    [DISCONTINUED] amLODipine (NORVASC) 5 MG tablet TAKE 1 TABLET BY MOUTH DAILY   [DISCONTINUED] ezetimibe (ZETIA) 10 MG tablet Take 1 tablet (10 mg total) by mouth daily. **PATIENT NEEDS APT FOR FURTHER REFILLS**   [DISCONTINUED] lisinopril (ZESTRIL) 20 MG tablet Take 20 mg by mouth daily.   [DISCONTINUED] metoprolol succinate (TOPROL-XL) 25 MG 24 hr tablet TAKE 1 TABLET BY MOUTH EACH MORNING   [DISCONTINUED] propafenone (RYTHMOL SR) 325 MG 12 hr capsule TAKE 1 CAPSULE BY MOUTH TWICE DAILY   [DISCONTINUED] XARELTO 20 MG TABS tablet TAKE 1 TABLET BY MOUTH DAILY     Allergies:   Crestor [rosuvastatin calcium] and Lipitor [atorvastatin calcium]   Social History   Socioeconomic History   Marital status: Divorced    Spouse name: Not on file   Number of children: Not on file   Years of education: Not on file   Highest education level: Not on file  Occupational History   Not on file  Tobacco Use   Smoking status: Never   Smokeless tobacco: Never  Vaping Use   Vaping Use: Never used  Substance and Sexual Activity   Alcohol use: Yes    Alcohol/week: 4.0 - 8.0 standard drinks of alcohol    Types: 2 - 4 Cans of beer, 2 - 4 Shots of liquor per week    Comment: 09/17/2012 "1-2 beers and 1-2 tequila on Fri and Sat nights"   Drug  use: No   Sexual activity: Yes  Other Topics Concern   Not on file  Social History Narrative   Not on file   Social Determinants of Health   Financial Resource Strain: Not on file  Food Insecurity: Not on file  Transportation Needs: Not on file  Physical Activity: Not on file  Stress: Not on file  Social Connections: Not on file     Family History: The patient's family history includes Cancer in his brother and mother; Heart attack in his maternal grandfather; Hyperlipidemia in his mother; Hypertension in his brother, father, and mother.  ROS:   Please  see the history of present illness.   All other systems reviewed and are negative.  Labs/Other Studies Reviewed:    The following studies were reviewed today:  Coronary CTA 08/18/2016 1) Calcium score 44 with calcium noted in proximal and mid LAD this is 72 nd percentile for age and sex   2) Essentially normal left dominant coronary arteries. Less than 30% calcified plaque in proximal and mid LAD  Echo 08/12/2016 Left ventricle: The cavity size was mildly dilated. Systolic    function was normal. The estimated ejection fraction was in the    range of 55% to 60%. Wall motion was normal; there were no    regional wall motion abnormalities. There was an increased    relative contribution of atrial contraction to ventricular    filling. Doppler parameters are consistent with abnormal left    ventricular relaxation (grade 1 diastolic dysfunction).  - Aorta: Aortic root dimension: 39 mm (ED).  - Aortic root: The aortic root was mildly dilated.  - Mitral valve: There was mild regurgitation.  - Right ventricle: The cavity size was mildly dilated. Wall    thickness was normal.  - Tricuspid valve: There was trivial regurgitation.   Recent Labs: 04/30/2021: BUN 15; Creatinine, Ser 1.49; Magnesium 2.3; Potassium 4.6; Sodium 137  Recent Lipid Panel    Component Value Date/Time   CHOL 128 10/01/2019 1408   TRIG 63 10/01/2019 1408    HDL 49 10/01/2019 1408   CHOLHDL 2.6 10/01/2019 1408   CHOLHDL 4.9 12/18/2015 0915   VLDL 13 12/18/2015 0915   LDLCALC 66 10/01/2019 1408     Risk Assessment/Calculations:    CHA2DS2-VASc Score = 3  {This indicates a 3.2% annual risk of stroke. The patient's score is based upon: CHF History: 1 HTN History: 1 Diabetes History: 0 Stroke History: 0 Vascular Disease History: 1 Age Score: 0 Gender Score: 0    Physical Exam:    VS:  BP 114/64   Pulse 78   Ht 6\' 3"  (1.905 m)   Wt 256 lb 12.8 oz (116.5 kg)   SpO2 97%   BMI 32.10 kg/m     Wt Readings from Last 3 Encounters:  02/18/22 256 lb 12.8 oz (116.5 kg)  04/30/21 236 lb 9.6 oz (107.3 kg)  12/21/20 267 lb 9.6 oz (121.4 kg)     GEN: Well nourished, well developed in no acute distress HEENT: Normal NECK: No JVD; No carotid bruits CARDIAC: RRR, no murmurs, rubs, gallops RESPIRATORY:  Clear to auscultation without rales, wheezing or rhonchi  ABDOMEN: Soft, non-tender, non-distended MUSCULOSKELETAL:  No edema; No deformity. 2+ pedal pulses, equal bilaterally SKIN: Warm and dry NEUROLOGIC:  Alert and oriented x 3 PSYCHIATRIC:  Normal affect   EKG:  EKG is ordered today.  The ekg ordered today demonstrates normal sinus rhythm at 74 bpm, stable QTc at 401 ms, TWI aVR, aVF, III, no acute change from previous tracing   Diagnoses:    1. PAF (paroxysmal atrial fibrillation) (HCC)   2. Mixed hyperlipidemia   3. Essential hypertension   4. Stage 3a chronic kidney disease (HCC)   5. Hypercoagulopathy (HCC)   6. Chronic anticoagulation- abnormal lupus anticoagulant   7. Long term current use of antiarrhythmic drug    Assessment and Plan:     PAF on AAD: Maintaining sinus rhythm at 74 bpm on EKG today. QT is stable. No episodes of tachycardia, palpitations. Had multiple medical procedures over the past 12 months with no reported a fib.  No bleeding problems on Xarelto. Continue metoprolol, propafenone, and  Xarelto.  CAD without angina: Less than 30% calcified disease in proximal and mid LAD on coronary CTA 08/2016. He denies chest pain, dyspnea, or other symptoms concerning for angina. No indication for further ischemic evaluation at this time. He has returned to regular exercise since getting relief from back pain. Continue secondary prevention including 150 minutes moderate intensity exercise each week, heart healthy, mostly plant-based diet, good BP and cholesterol control.  Hypercoagulopathy on chronic anticoagulation: Hx of hypercoagulable state with abnormal lupus anticoagulant on lifelong anticoagulation. Hx of PE, frequent DVT,and PAF. He is tolerating Xarelto without any bleeding concerns. Had small DVT during one of his procedures while holding Xarelto. Will plan for Lovenox bridge in the future. Will get CBC today.   Hypertension: BP is well controlled. He is feeling well on current regimen with no side effects from current medications.   CKD: Concerned about prior history of abnormal GFR. Pays better attention to hydration and diet now. Will recheck today.   Hyperlipidemia LDL goal < 70: LDL 44 on 07/27/21. We will recheck today. No problems with Repatha. Continue Repatha, ezetimibe, and Lovaza.     Disposition: 6 months with Dr. Acie [name] or me  Medication Adjustments/Labs and Tests Ordered: Current medicines are reviewed at length with the patient today.  Concerns regarding medicines are outlined above.  Orders Placed This Encounter  Procedures   Comp Met (CMET)   Lipid Profile   CBC   EKG 12-Lead   Meds ordered this encounter  Medications   amLODipine (NORVASC) 5 MG tablet    Sig: Take 1 tablet (5 mg total) by mouth daily.    Dispense:  90 tablet    Refill:  3   ezetimibe (ZETIA) 10 MG tablet    Sig: Take 1 tablet (10 mg total) by mouth daily.    Dispense:  90 tablet    Refill:  3   lisinopril (ZESTRIL) 20 MG tablet    Sig: Take 1 tablet (20 mg total) by mouth daily.     Dispense:  90 tablet    Refill:  3   metoprolol succinate (TOPROL-XL) 25 MG 24 hr tablet    Sig: Take one (1) tablet by mouth (25 mg ) daily.    Dispense:  90 tablet    Refill:  3   rivaroxaban (XARELTO) 20 MG TABS tablet    Sig: Take 1 tablet (20 mg total) by mouth daily.    Dispense:  90 tablet    Refill:  3   propafenone (RYTHMOL SR) 325 MG 12 hr capsule    Sig: Take 1 capsule (325 mg total) by mouth 2 (two) times daily.    Dispense:  180 capsule    Refill:  3    Patient Instructions  Medication Instructions:   Your physician recommends that you continue on your current medications as directed. Please refer to the Current Medication list given to you today.   *If you need a refill on your cardiac medications before your next appointment, please call your pharmacy*   Lab Work:  TODAY!!!! CMET/CBC/LIPID  If you have labs (blood work) drawn today and your tests are completely normal, you will receive your results only by: [place] (if you have MyChart) OR A paper copy in the mail If you have any lab test that is abnormal or we need to change your treatment, we will call you to review the results.   Testing/Procedures:  None  ordered.   Follow-Up: At [facility], you and your health needs are our priority.  As part of our continuing mission to provide you with exceptional heart care, we have created designated Provider Care Teams.  These Care Teams include your primary Cardiologist (physician) and Advanced Practice [name] (APPs -  Physician Assistants and Nurse Practitioners) who all work together to provide you with the care you need, when you need it.  We recommend signing up for the patient portal called "MyChart".  Sign up information is provided on this After Visit Summary.  MyChart is used to connect with patients for Virtual Visits (Telemedicine).  Patients are able to view lab/test results, encounter notes, upcoming appointments, etc.  Non-urgent  messages can be sent to your provider as well.   To learn more about what you can do with MyChart, go to [email or web address].    Your next appointment:   6 month(s)  Provider:   Eligha Bridegroom, NP         Other Instructions  Your physician wants you to follow-up in: 6 months with Lebron Conners. You will receive a reminder letter in the mail two months in advance. If you don't receive a letter, please call our office to schedule the follow-up appointment.     Signed, Levi Aland, NP  02/18/2022 9:18 AM    [facility]

## 2022-02-11 ENCOUNTER — Ambulatory Visit: Payer: BC Managed Care – PPO | Admitting: Nurse Practitioner

## 2022-02-18 ENCOUNTER — Encounter: Payer: Self-pay | Admitting: Nurse Practitioner

## 2022-02-18 ENCOUNTER — Ambulatory Visit: Payer: BC Managed Care – PPO | Attending: Nurse Practitioner | Admitting: Nurse Practitioner

## 2022-02-18 VITALS — BP 114/64 | HR 78 | Ht 75.0 in | Wt 256.8 lb

## 2022-02-18 DIAGNOSIS — I480 Paroxysmal atrial fibrillation: Secondary | ICD-10-CM

## 2022-02-18 DIAGNOSIS — N1831 Chronic kidney disease, stage 3a: Secondary | ICD-10-CM

## 2022-02-18 DIAGNOSIS — Z7901 Long term (current) use of anticoagulants: Secondary | ICD-10-CM

## 2022-02-18 DIAGNOSIS — I10 Essential (primary) hypertension: Secondary | ICD-10-CM

## 2022-02-18 DIAGNOSIS — D6859 Other primary thrombophilia: Secondary | ICD-10-CM

## 2022-02-18 DIAGNOSIS — E782 Mixed hyperlipidemia: Secondary | ICD-10-CM

## 2022-02-18 DIAGNOSIS — Z79899 Other long term (current) drug therapy: Secondary | ICD-10-CM

## 2022-02-18 MED ORDER — RIVAROXABAN 20 MG PO TABS: 20.0000 mg | ORAL_TABLET | Freq: Every day | ORAL | 3 refills | Status: DC

## 2022-02-18 MED ORDER — AMLODIPINE BESYLATE 5 MG PO TABS: 5.0000 mg | ORAL_TABLET | Freq: Every day | ORAL | 3 refills | Status: DC

## 2022-02-18 MED ORDER — PROPAFENONE HCL ER 325 MG PO CP12: 325.0000 mg | ORAL_CAPSULE | Freq: Two times a day (BID) | ORAL | 3 refills | Status: DC

## 2022-02-18 MED ORDER — METOPROLOL SUCCINATE ER 25 MG PO TB24: ORAL_TABLET | ORAL | 3 refills | Status: DC

## 2022-02-18 MED ORDER — LISINOPRIL 20 MG PO TABS: 20.0000 mg | ORAL_TABLET | Freq: Every day | ORAL | 3 refills | Status: DC

## 2022-02-18 MED ORDER — EZETIMIBE 10 MG PO TABS: 10.0000 mg | ORAL_TABLET | Freq: Every day | ORAL | 3 refills | Status: DC

## 2022-02-18 NOTE — Patient Instructions (Signed)
Medication Instructions:   Your physician recommends that you continue on your current medications as directed. Please refer to the Current Medication list given to you today.   *If you need a refill on your cardiac medications before your next appointment, please call your pharmacy*   Lab Work:  TODAY!!!! CMET/CBC/LIPID  If you have labs (blood work) drawn today and your tests are completely normal, you will receive your results only by: [place] (if you have MyChart) OR A paper copy in the mail If you have any lab test that is abnormal or we need to change your treatment, we will call you to review the results.   Testing/Procedures:  None ordered.   Follow-Up: At [facility], you and your health needs are our priority.  As part of our continuing mission to provide you with exceptional heart care, we have created designated Provider Care Teams.  These Care Teams include your primary Cardiologist (physician) and Advanced Practice Providers (APPs -  Physician Assistants and Nurse Practitioners) who all work together to provide you with the care you need, when you need it.  We recommend signing up for the patient portal called "MyChart".  Sign up information is provided on this After Visit Summary.  MyChart is used to connect with patients for Virtual Visits (Telemedicine).  Patients are able to view lab/test results, encounter notes, upcoming appointments, etc.  Non-urgent messages can be sent to your provider as well.   To learn more about what you can do with MyChart, go to [email or web address].    Your next appointment:   6 month(s)  Provider:   Christen Bame, NP         Other Instructions  Your physician wants you to follow-up in: 6 months with Lorenda Peck. You will receive a reminder letter in the mail two months in advance. If you don't receive a letter, please call our office to schedule the follow-up appointment.

## 2022-02-19 LAB — COMPREHENSIVE METABOLIC PANEL
ALT: 26 IU/L (ref 0–44)
AST: 31 IU/L (ref 0–40)
Albumin/Globulin Ratio: 1.6 (ref 1.2–2.2)
Albumin: 4.4 g/dL (ref 3.8–4.9)
Alkaline Phosphatase: 78 IU/L (ref 44–121)
BUN/Creatinine Ratio: 13 (ref 9–20)
BUN: 18 mg/dL (ref 6–24)
Bilirubin Total: 0.8 mg/dL (ref 0.0–1.2)
CO2: 23 mmol/L (ref 20–29)
Calcium: 9.6 mg/dL (ref 8.7–10.2)
Chloride: 101 mmol/L (ref 96–106)
Creatinine, Ser: 1.4 mg/dL — ABNORMAL HIGH (ref 0.76–1.27)
Globulin, Total: 2.7 g/dL (ref 1.5–4.5)
Glucose: 91 mg/dL (ref 70–99)
Potassium: 4.3 mmol/L (ref 3.5–5.2)
Sodium: 138 mmol/L (ref 134–144)
Total Protein: 7.1 g/dL (ref 6.0–8.5)
eGFR: 58 mL/min/{1.73_m2} — ABNORMAL LOW (ref >59)

## 2022-02-19 LAB — LIPID PANEL
Chol/HDL Ratio: 2.7 ratio (ref 0.0–5.0)
Cholesterol, Total: 112 mg/dL (ref 100–199)
HDL: 42 mg/dL (ref >39)
LDL Chol Calc (NIH): 55 mg/dL (ref 0–99)
Triglycerides: 71 mg/dL (ref 0–149)
VLDL Cholesterol Cal: 15 mg/dL (ref 5–40)

## 2022-02-19 LAB — CBC
Hematocrit: 47.1 % (ref 37.5–51.0)
Hemoglobin: 15.5 g/dL (ref 13.0–17.7)
MCH: 27.5 pg (ref 26.6–33.0)
MCHC: 32.9 g/dL (ref 31.5–35.7)
MCV: 84 fL (ref 79–97)
Platelets: 227 10*3/uL (ref 150–450)
RBC: 5.63 x10E6/uL (ref 4.14–5.80)
RDW: 17.4 % — ABNORMAL HIGH (ref 11.6–15.4)
WBC: 4.7 10*3/uL (ref 3.4–10.8)

## 2022-04-04 DIAGNOSIS — I2584 Coronary atherosclerosis due to calcified coronary lesion: Secondary | ICD-10-CM | POA: Diagnosis not present

## 2022-04-04 DIAGNOSIS — N1831 Chronic kidney disease, stage 3a: Secondary | ICD-10-CM | POA: Diagnosis not present

## 2022-04-04 DIAGNOSIS — R7302 Impaired glucose tolerance (oral): Secondary | ICD-10-CM | POA: Diagnosis not present

## 2022-04-04 DIAGNOSIS — E039 Hypothyroidism, unspecified: Secondary | ICD-10-CM | POA: Diagnosis not present

## 2022-04-04 DIAGNOSIS — I700 Atherosclerosis of aorta: Secondary | ICD-10-CM | POA: Diagnosis not present

## 2022-04-04 DIAGNOSIS — I480 Paroxysmal atrial fibrillation: Secondary | ICD-10-CM | POA: Diagnosis not present

## 2022-05-18 DIAGNOSIS — L570 Actinic keratosis: Secondary | ICD-10-CM | POA: Diagnosis not present

## 2022-05-18 DIAGNOSIS — L738 Other specified follicular disorders: Secondary | ICD-10-CM | POA: Diagnosis not present

## 2022-05-18 DIAGNOSIS — L565 Disseminated superficial actinic porokeratosis (DSAP): Secondary | ICD-10-CM | POA: Diagnosis not present

## 2022-05-18 DIAGNOSIS — L812 Freckles: Secondary | ICD-10-CM | POA: Diagnosis not present

## 2022-05-18 DIAGNOSIS — Z85828 Personal history of other malignant neoplasm of skin: Secondary | ICD-10-CM | POA: Diagnosis not present

## 2022-05-18 DIAGNOSIS — D225 Melanocytic nevi of trunk: Secondary | ICD-10-CM | POA: Diagnosis not present

## 2022-05-18 DIAGNOSIS — L820 Inflamed seborrheic keratosis: Secondary | ICD-10-CM | POA: Diagnosis not present

## 2022-06-21 ENCOUNTER — Other Ambulatory Visit: Payer: Self-pay | Admitting: Pharmacist

## 2022-06-21 DIAGNOSIS — E782 Mixed hyperlipidemia: Secondary | ICD-10-CM

## 2022-06-21 MED ORDER — REPATHA SURECLICK 140 MG/ML ~~LOC~~ SOAJ: 140.0000 mg | SUBCUTANEOUS | 3 refills | Status: DC

## 2022-08-08 DIAGNOSIS — R7302 Impaired glucose tolerance (oral): Secondary | ICD-10-CM | POA: Diagnosis not present

## 2022-08-08 DIAGNOSIS — I700 Atherosclerosis of aorta: Secondary | ICD-10-CM | POA: Diagnosis not present

## 2022-08-08 DIAGNOSIS — E039 Hypothyroidism, unspecified: Secondary | ICD-10-CM | POA: Diagnosis not present

## 2022-08-08 DIAGNOSIS — Z113 Encounter for screening for infections with a predominantly sexual mode of transmission: Secondary | ICD-10-CM | POA: Diagnosis not present

## 2022-08-11 NOTE — Progress Notes (Signed)
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 male with a hx of hypercoagulability state with abnormal lupus anticoagulant on lifelong anticoagulation, PE, frequent DVT, PAF, hypothyroidism, HLD, HTN, OSA on CPAP, mild nonobstructive CAD with elevated calcium score of 44 on CCTA 2018, and chronic HFpEF.   Has been followed by cardiology for many years and has maintained consistent follow-up. He works as a Education officer, community and enjoys riding motorcycles.   Last cardiology clinic visit was with Dr. Elease Hashimoto on 04/30/21 at which time he reported during a previous back surgery, he was not bridged with Lovenox and developed a small DVT in his left leg. Will recommend Lovenox with future surgeries. Maintained on propafenone for PAF as well as Toprol XL. He had stable labs and BP and was advised to return in 6 months for follow-up.   He contacted our office 10/26/2021 with elevated BP. Amlodipine 5 mg daily was added to lisinopril 20 mg and Toprol 25 daily. He was asked to send BP readings after starting amlodipine.  Seen in clinic by me on 02/18/22. BP was well controlled since starting amlodipine. Reported he is doing much better since he has undergone multiple surgeries to alleviate back pain and is finally able to exercise again without significant pain. Received stem cell infusions x 2 in [place] recently. He denied chest pain, shortness of breath, lower extremity edema, fatigue, palpitations, melena,  presyncope, syncope, orthopnea, and PND. Concerned about kidney function, would like [place] to recheck. No cardiac concerns. No concerning symptoms of a fib. Labs completed - Scr 1.4, eGFR 58, LDL 55,  triglycerides 71, stable blood counts and liver function tests.  Today, he is here alone for follow-up. Reports he is feeling well. Is helping to care for his aging parents which is stressful. Is lifting weights 4 days per week and walking daily for exercise. Reports he occasionally gets winded at the top of a hill. No orthopnea, PND, palpitations, chest pain, presyncope or syncope. BP has been well controlled. No bleeding concerns. No evidence of a fib to his awareness.    Past Medical History:  Diagnosis Date   Arthritis    Blood dyscrasia    lupus anticoagulant   Chronic kidney disease    stage 2   Diastolic dysfunction    Grade I per echo in 2/12   DVT (deep venous thrombosis) (HCC) 2000's   "both legs in the calves; left went all the way up to my groin" (09/17/2012)   Dysrhythmia    GERD (gastroesophageal reflux disease)    History of bronchitis    Hypercoagulation syndrome (HCC)    secondary to circulating lupus anticoagulant   Hyperlipidemia    Hypertension    Hypothyroidism    "wiped out from taking amiodarone" (09/17/2012)   Lyme disease    history of    Obesity (BMI 30-39.9) 07/17/2014   PAF (paroxysmal atrial fibrillation) (HCC)    no recurrence since Feb 2012   Pneumonia    PONV (postoperative nausea and vomiting)    likes scopolomanine patch   Pulmonary embolism (HCC) 09/17/2012   "just today; 3 on the right; 2  on the left" (09/17/2012)   Situational stress    Sleep apnea    uses CPAP    Past Surgical History:  Procedure Laterality Date   CARDIOVASCULAR STRESS TEST  07/17/2009   EF 59%   CARDIOVERSION     DISTAL BICEPS TENDON REPAIR Right 2011   INGUINAL HERNIA REPAIR Right 1992   INGUINAL HERNIA REPAIR Left 2013   INSERTION OF MESH N/A 05/14/2015   Procedure: INSERTION OF MESH;  Surgeon: Avel Peace, MD;  Location: [facility];  Service: General;  Laterality: N/A;   SHOULDER ARTHROSCOPY W/ ROTATOR CUFF REPAIR Left 09/04/2012   SHOULDER SURGERY     left   TOTAL HIP  ARTHROPLASTY Right 11/26/2014   Procedure: RIGHT TOTAL HIP ARTHROPLASTY ANTERIOR APPROACH;  Surgeon: Ollen Gross, MD;  Location: [facility];  Service: Orthopedics;  Laterality: Right;   TOTAL HIP ARTHROPLASTY Left 01/30/2019   Procedure: TOTAL HIP ARTHROPLASTY ANTERIOR APPROACH;  Surgeon: Ollen Gross, MD;  Location: [facility];  Service: Orthopedics;  Laterality: Left;    TRANSTHORACIC ECHOCARDIOGRAM  03/05/2010   EF 60-65%   UMBILICAL HERNIA REPAIR N/A 05/14/2015   Procedure: HERNIA REPAIR UMBILICAL ADULT WITH MESH ;  Surgeon: Avel Peace, MD;  Location: [facility];  Service: General;  Laterality: N/A;   WISDOM TOOTH EXTRACTION     lower 2    Current Medications: Current Meds  Medication Sig   ALPRAZolam (XANAX) 0.25 MG tablet Take 1 tablet by mouth 2 (two) times daily as needed.   amLODipine (NORVASC) 5 MG tablet Take 1 tablet (5 mg total) by mouth daily.   Evolocumab (REPATHA SURECLICK) 140 MG/ML SOAJ Inject 140 mg into the skin every 14 (fourteen) days.   ezetimibe (ZETIA) 10 MG tablet Take 1 tablet (10 mg total) by mouth daily.   lisinopril (ZESTRIL) 20 MG tablet Take 1 tablet (20 mg total) by mouth daily.   metoprolol succinate (TOPROL-XL) 25 MG 24 hr tablet Take one (1) tablet by mouth (25 mg ) daily.   omega-3 acid ethyl esters (LOVAZA) 1 g capsule 2 tab po BID   omeprazole (PRILOSEC) 20 MG capsule Take 1 capsule (20 mg total) by mouth daily.   propafenone (RYTHMOL SR) 325 MG 12 hr capsule Take 1 capsule (325 mg total) by mouth 2 (two) times daily.   rivaroxaban (XARELTO) 20 MG TABS tablet Take 1 tablet (20 mg total) by mouth daily.   SYNTHROID 150 MCG tablet Take 300 mcg by mouth daily.    tadalafil (CIALIS) 5 MG tablet Take 5 mg by mouth daily.   tamsulosin (FLOMAX) 0.4 MG CAPS capsule Take 0.4 mg by mouth every evening.      Allergies:   Crestor [rosuvastatin calcium] and Lipitor [atorvastatin calcium]   Social History   Socioeconomic History   Marital status: Divorced     Spouse name: Not on file   Number of children: Not on file   Years of education: Not on file   Highest education level: Not on file  Occupational History   Not on file  Tobacco Use   Smoking status: Never   Smokeless tobacco: Never  Vaping Use   Vaping status: Never Used  Substance and Sexual Activity   Alcohol use: Yes    Alcohol/week: 4.0 - 8.0 standard drinks of alcohol    Types: 2 - 4 Cans of beer, 2 - 4 Shots of liquor per week    Comment: 09/17/2012 "1-2 beers and 1-2 tequila on Fri and Sat nights"  Drug use: No   Sexual activity: Yes  Other Topics Concern   Not on file  Social History Narrative   Not on file   Social Determinants of Health   Financial Resource Strain: Low Risk  (05/11/2021)   Received from [facility], [facility]   Overall Financial Resource Strain (CARDIA)    Difficulty of Paying Living Expenses: Not hard at all  Food Insecurity: No Food Insecurity (05/11/2021)   Received from [facility], [facility]   Hunger Vital Sign    Worried About Running Out of Food in the Last Year: Never true    Ran Out of Food in the Last Year: Never true  Transportation Needs: Not on file  Physical Activity: Insufficiently Active (05/11/2021)   Received from [facility], [facility]   Exercise Vital Sign    Days of Exercise per Week: 3 days    Minutes of Exercise per Session: 20 min  Stress: No Stress Concern Present (05/11/2021)   Received from [facility], [facility] - Occupational Stress Questionnaire    Feeling of Stress : Only a little  Social Connections: Unknown (05/30/2022)   Received from [facility], [facility]   Social Network    Social Network: Not on file     Family History: The patient's family history includes Cancer in his brother and mother; Heart attack in his maternal grandfather; Hyperlipidemia in his mother; Hypertension in his brother, father, and mother.  ROS:   Please see  the history of present illness.  + occasional shortness of breath All other systems reviewed and are negative.   Labs/Other Studies Reviewed:    The following studies were reviewed today:  Coronary CTA 08/18/2016 1) Calcium score 44 with calcium noted in proximal and mid LAD this is 72 nd percentile for age and sex   2) Essentially normal left dominant coronary arteries. Less than 30% calcified plaque in proximal and mid LAD  Echo 08/12/2016 Left ventricle: The cavity size was mildly dilated. Systolic    function was normal. The estimated ejection fraction was in the    range of 55% to 60%. Wall motion was normal; there were no    regional wall motion abnormalities. There was an increased    relative contribution of atrial contraction to ventricular    filling. Doppler parameters are consistent with abnormal left    ventricular relaxation (grade 1 diastolic dysfunction).  - Aorta: Aortic root dimension: 39 mm (ED).  - Aortic root: The aortic root was mildly dilated.  - Mitral valve: There was mild regurgitation.  - Right ventricle: The cavity size was mildly dilated. Wall    thickness was normal.  - Tricuspid valve: There was trivial regurgitation.   Recent Labs: 02/18/2022: ALT 26; BUN 18; Creatinine, Ser 1.40; Hemoglobin 15.5; Platelets 227; Potassium 4.3; Sodium 138  Recent Lipid Panel    Component Value Date/Time   CHOL 112 02/18/2022 0857   TRIG 71 02/18/2022 0857   HDL 42 02/18/2022 0857   CHOLHDL 2.7 02/18/2022 0857   CHOLHDL 4.9 12/18/2015 0915   VLDL 13 12/18/2015 0915   LDLCALC 55 02/18/2022 0857     Risk Assessment/Calculations:    CHA2DS2-VASc Score = 3  {This indicates a 3.2% annual risk of stroke. The patient's score is based upon: CHF History: 1 HTN History: 1 Diabetes History: 0 Stroke History: 0 Vascular Disease History: 1 Age Score: 0 Gender Score: 0    Physical Exam:  VS:  BP 110/70   Pulse 70   Ht 6\' 3"  (1.905 m)   Wt 251 lb 9.6 oz  (114.1 kg)   SpO2 96%   BMI 31.45 kg/m     Wt Readings from Last 3 Encounters:  08/19/22 251 lb 9.6 oz (114.1 kg)  02/18/22 256 lb 12.8 oz (116.5 kg)  04/30/21 236 lb 9.6 oz (107.3 kg)     GEN: Well nourished, well developed in no acute distress HEENT: Normal NECK: No JVD; No carotid bruits CARDIAC: RRR, no murmurs, rubs, gallops RESPIRATORY:  Clear to auscultation without rales, wheezing or rhonchi  ABDOMEN: Soft, non-tender, non-distended MUSCULOSKELETAL:  No edema; No deformity. 2+ pedal pulses, equal bilaterally SKIN: Warm and dry NEUROLOGIC:  Alert and oriented x 3 PSYCHIATRIC:  Normal affect   EKG:  EKG is not ordered today   Diagnoses:    1. Mitral valve insufficiency, unspecified etiology   2. PAF (paroxysmal atrial fibrillation) (HCC)   3. Mixed hyperlipidemia   4. Long term current use of antiarrhythmic drug   5. Hyperlipidemia LDL goal <70   6. Essential hypertension   7. Chronic anticoagulation- abnormal lupus anticoagulant   8. Stage 3a chronic kidney disease (HCC)   9. Coronary artery disease involving native coronary artery of native heart without angina pectoris     Assessment and Plan:     PAF on AAD/Chronic anticoagulation: HR is stable at 70bpm. Clinically appears to be maintaining sinus rhythm on exam today. No episodes of tachycardia, palpitations. We discussed appropriate HR with exercise on beta blocker. No bleeding problems on Xarelto.  Continue Xarelto for stroke prevention for CHA2DS2-VASc score of 3. Continue metoprolol and propafenone.  CAD without angina: Coronary CTA 08/2016 with < 30% calcified disease in proximal and mid LAD. He is having mild shortness of breath at the top of an incline. Continues to walk 1 to 1 1/2 miles daily. He denies chest pain or dyspnea. Will get echo for evaluation of heart and valve function. Consider further ischemia evaluation if symptoms persist. Continue secondary prevention including 150 minutes moderate  intensity exercise each week, heart healthy, mostly plant-based diet, good BP and cholesterol control.   Hypercoagulopathy on chronic anticoagulation: Hx of hypercoagulable state with abnormal lupus anticoagulant on lifelong anticoagulation. Hx of PE, frequent DVT,and PAF. No bleeding concerns on  Xarelto. Had small DVT during one of his procedures while holding Xarelto in 2023. Will plan for Lovenox bridge in the future. Will check CBC today.   Mitral regurgitation: Mild MR on echo 08/12/16.  He is having increased shortness of breath with significant exertion.  Will recheck echo for evaluation of structural heart disease.  Hypertension: BP is well controlled.   CKD: Asks that we closely monitor kidney function. Scr 1.40, eGFR 58 on 02/18/22. Will recheck today.   Hyperlipidemia LDL goal < 70: LDL 55 on 02/18/22. We will recheck fasting lipid panel today. Continue Repatha, ezetimibe, and Lovaza.     Disposition: 6 months with me  Medication Adjustments/Labs and Tests Ordered: Current medicines are reviewed at length with the patient today.  Concerns regarding medicines are outlined above.  Orders Placed This Encounter  Procedures   Comp Met (CMET)   Lipid Profile   CBC   ECHOCARDIOGRAM COMPLETE   No orders of the defined types were placed in this encounter.   Patient Instructions  Medication Instructions:   Your physician recommends that you continue on your current medications as directed. Please refer to the Current Medication  list given to you today.   *If you need a refill on your cardiac medications before your next appointment, please call your pharmacy*   Lab Work:  TODAY!!!CMET/CBC/LIPID  If you have labs (blood work) drawn today and your tests are completely normal, you will receive your results only by: MyChart Message (if you have MyChart) OR A paper copy in the mail If you have any lab test that is abnormal or we need to change your treatment, we will call you to  review the results.   Testing/Procedures:  Your physician has requested that you have an echocardiogram. Echocardiography is a painless test that uses sound waves to create images of your heart. It provides your doctor with information about the size and shape of your heart and how well your heart's chambers and valves are working. This procedure takes approximately one hour. There are no restrictions for this procedure. Please do NOT wear cologne, aftershave, or lotions (deodorant is allowed). Please arrive 15 minutes prior to your appointment time.    Follow-Up: At [facility], you and your health needs are our priority.  As part of our continuing mission to provide you with exceptional heart care, we have created designated Provider Care Teams.  These Care Teams include your primary Cardiologist (physician) and Advanced Practice Providers (APPs -  Physician Assistants and Nurse Practitioners) who all work together to provide you with the care you need, when you need it.  We recommend signing up for the patient portal called "MyChart".  Sign up information is provided on this After Visit Summary.  MyChart is used to connect with patients for Virtual Visits (Telemedicine).  Patients are able to view lab/test results, encounter notes, upcoming appointments, etc.  Non-urgent messages can be sent to your provider as well.   To learn more about what you can do with MyChart, go to [email or web address].    Your next appointment:   6 month(s)  Provider:   Eligha Bridegroom, NP         Other Instructions  I will call you or send you a Mychart message in October when the schedule comes out.     Signed, Levi Aland, NP  08/19/2022 11:34 AM    [facility]

## 2022-08-19 ENCOUNTER — Ambulatory Visit: Payer: BC Managed Care – PPO | Attending: Nurse Practitioner | Admitting: Nurse Practitioner

## 2022-08-19 ENCOUNTER — Encounter: Payer: Self-pay | Admitting: Nurse Practitioner

## 2022-08-19 VITALS — BP 110/70 | HR 70 | Ht 75.0 in | Wt 251.6 lb

## 2022-08-19 DIAGNOSIS — I480 Paroxysmal atrial fibrillation: Secondary | ICD-10-CM

## 2022-08-19 DIAGNOSIS — Z79899 Other long term (current) drug therapy: Secondary | ICD-10-CM | POA: Diagnosis not present

## 2022-08-19 DIAGNOSIS — I2510 Atherosclerotic heart disease of native coronary artery without angina pectoris: Secondary | ICD-10-CM

## 2022-08-19 DIAGNOSIS — Z7901 Long term (current) use of anticoagulants: Secondary | ICD-10-CM

## 2022-08-19 DIAGNOSIS — I10 Essential (primary) hypertension: Secondary | ICD-10-CM

## 2022-08-19 DIAGNOSIS — E782 Mixed hyperlipidemia: Secondary | ICD-10-CM

## 2022-08-19 DIAGNOSIS — E785 Hyperlipidemia, unspecified: Secondary | ICD-10-CM

## 2022-08-19 DIAGNOSIS — I340 Nonrheumatic mitral (valve) insufficiency: Secondary | ICD-10-CM

## 2022-08-19 DIAGNOSIS — N1831 Chronic kidney disease, stage 3a: Secondary | ICD-10-CM

## 2022-08-19 NOTE — Patient Instructions (Signed)
Medication Instructions:   Your physician recommends that you continue on your current medications as directed. Please refer to the Current Medication list given to you today.   *If you need a refill on your cardiac medications before your next appointment, please call your pharmacy*   Lab Work:  TODAY!!!CMET/CBC/LIPID  If you have labs (blood work) drawn today and your tests are completely normal, you will receive your results only by: MyChart Message (if you have MyChart) OR A paper copy in the mail If you have any lab test that is abnormal or we need to change your treatment, we will call you to review the results.   Testing/Procedures:  Your physician has requested that you have an echocardiogram. Echocardiography is a painless test that uses sound waves to create images of your heart. It provides your doctor with information about the size and shape of your heart and how well your heart's chambers and valves are working. This procedure takes approximately one hour. There are no restrictions for this procedure. Please do NOT wear cologne, aftershave, or lotions (deodorant is allowed). Please arrive 15 minutes prior to your appointment time.    Follow-Up: At [facility], you and your health needs are our priority.  As part of our continuing mission to provide you with exceptional heart care, we have created designated Provider Care Teams.  These Care Teams include your primary Cardiologist (physician) and Advanced Practice Providers (APPs -  Physician Assistants and Nurse Practitioners) who all work together to provide you with the care you need, when you need it.  We recommend signing up for the patient portal called "MyChart".  Sign up information is provided on this After Visit Summary.  MyChart is used to connect with patients for Virtual Visits (Telemedicine).  Patients are able to view lab/test results, encounter notes, upcoming appointments, etc.  Non-urgent messages  can be sent to your provider as well.   To learn more about what you can do with MyChart, go to [email or web address].    Your next appointment:   6 month(s)  Provider:   Eligha Bridegroom, NP         Other Instructions  I will call you or send you a Mychart message in October when the schedule comes out.

## 2022-08-24 ENCOUNTER — Other Ambulatory Visit (HOSPITAL_BASED_OUTPATIENT_CLINIC_OR_DEPARTMENT_OTHER): Payer: Self-pay | Admitting: *Deleted

## 2022-08-24 DIAGNOSIS — Z79899 Other long term (current) drug therapy: Secondary | ICD-10-CM

## 2022-08-24 DIAGNOSIS — I340 Nonrheumatic mitral (valve) insufficiency: Secondary | ICD-10-CM

## 2022-08-24 DIAGNOSIS — I480 Paroxysmal atrial fibrillation: Secondary | ICD-10-CM

## 2022-08-24 MED ORDER — LISINOPRIL 20 MG PO TABS: 10.0000 mg | ORAL_TABLET | Freq: Every day | ORAL | Status: DC

## 2022-08-26 ENCOUNTER — Ambulatory Visit (HOSPITAL_COMMUNITY): Payer: BC Managed Care – PPO | Attending: Cardiology

## 2022-08-26 DIAGNOSIS — Z79899 Other long term (current) drug therapy: Secondary | ICD-10-CM | POA: Diagnosis not present

## 2022-08-26 DIAGNOSIS — I480 Paroxysmal atrial fibrillation: Secondary | ICD-10-CM

## 2022-08-26 DIAGNOSIS — I340 Nonrheumatic mitral (valve) insufficiency: Secondary | ICD-10-CM | POA: Diagnosis not present

## 2022-08-26 DIAGNOSIS — E782 Mixed hyperlipidemia: Secondary | ICD-10-CM | POA: Diagnosis not present

## 2022-08-26 LAB — ECHOCARDIOGRAM COMPLETE
Area-P 1/2: 3.42 cm2
Calc EF: 56.5 %
Radius: 0.3 cm
S' Lateral: 3.2 cm
Single Plane A2C EF: 58.8 %
Single Plane A4C EF: 53.5 %

## 2022-09-16 DIAGNOSIS — N401 Enlarged prostate with lower urinary tract symptoms: Secondary | ICD-10-CM | POA: Diagnosis not present

## 2022-09-16 DIAGNOSIS — R3912 Poor urinary stream: Secondary | ICD-10-CM | POA: Diagnosis not present

## 2022-10-03 ENCOUNTER — Other Ambulatory Visit: Payer: Self-pay | Admitting: Cardiovascular Disease

## 2022-10-03 DIAGNOSIS — I480 Paroxysmal atrial fibrillation: Secondary | ICD-10-CM

## 2022-10-04 NOTE — Telephone Encounter (Signed)
Prescription refill request for Xarelto received.  Indication:afib Last office visit:8/24 Weight:114.1  kg Age:62 Scr:1.80  8/24 CrCl:70.43  ml/min  Prescription refilled

## 2022-10-11 DIAGNOSIS — R972 Elevated prostate specific antigen [PSA]: Secondary | ICD-10-CM | POA: Diagnosis not present

## 2022-10-11 DIAGNOSIS — R351 Nocturia: Secondary | ICD-10-CM | POA: Diagnosis not present

## 2022-10-11 DIAGNOSIS — N401 Enlarged prostate with lower urinary tract symptoms: Secondary | ICD-10-CM | POA: Diagnosis not present

## 2022-10-11 DIAGNOSIS — R350 Frequency of micturition: Secondary | ICD-10-CM | POA: Diagnosis not present

## 2022-10-14 ENCOUNTER — Ambulatory Visit: Payer: BC Managed Care – PPO | Attending: Nurse Practitioner

## 2022-10-14 DIAGNOSIS — I480 Paroxysmal atrial fibrillation: Secondary | ICD-10-CM

## 2022-10-14 DIAGNOSIS — I340 Nonrheumatic mitral (valve) insufficiency: Secondary | ICD-10-CM | POA: Diagnosis not present

## 2022-10-14 DIAGNOSIS — Z79899 Other long term (current) drug therapy: Secondary | ICD-10-CM

## 2022-10-15 LAB — BASIC METABOLIC PANEL
BUN/Creatinine Ratio: 13 (ref 10–24)
BUN: 16 mg/dL (ref 8–27)
CO2: 24 mmol/L (ref 20–29)
Calcium: 9.4 mg/dL (ref 8.6–10.2)
Chloride: 99 mmol/L (ref 96–106)
Creatinine, Ser: 1.23 mg/dL (ref 0.76–1.27)
Glucose: 83 mg/dL (ref 70–99)
Potassium: 4.4 mmol/L (ref 3.5–5.2)
Sodium: 136 mmol/L (ref 134–144)
eGFR: 67 mL/min/{1.73_m2} (ref >59)

## 2022-11-23 DIAGNOSIS — L812 Freckles: Secondary | ICD-10-CM | POA: Diagnosis not present

## 2022-11-23 DIAGNOSIS — Z85828 Personal history of other malignant neoplasm of skin: Secondary | ICD-10-CM | POA: Diagnosis not present

## 2022-11-23 DIAGNOSIS — L821 Other seborrheic keratosis: Secondary | ICD-10-CM | POA: Diagnosis not present

## 2022-11-23 DIAGNOSIS — L570 Actinic keratosis: Secondary | ICD-10-CM | POA: Diagnosis not present

## 2022-11-28 DIAGNOSIS — R3912 Poor urinary stream: Secondary | ICD-10-CM | POA: Diagnosis not present

## 2022-11-28 DIAGNOSIS — R351 Nocturia: Secondary | ICD-10-CM | POA: Diagnosis not present

## 2022-11-28 DIAGNOSIS — R350 Frequency of micturition: Secondary | ICD-10-CM | POA: Diagnosis not present

## 2022-12-07 DIAGNOSIS — R3912 Poor urinary stream: Secondary | ICD-10-CM | POA: Diagnosis not present

## 2022-12-07 DIAGNOSIS — R351 Nocturia: Secondary | ICD-10-CM | POA: Diagnosis not present

## 2022-12-07 DIAGNOSIS — N401 Enlarged prostate with lower urinary tract symptoms: Secondary | ICD-10-CM | POA: Diagnosis not present

## 2022-12-07 DIAGNOSIS — R3916 Straining to void: Secondary | ICD-10-CM | POA: Diagnosis not present

## 2022-12-14 DIAGNOSIS — I700 Atherosclerosis of aorta: Secondary | ICD-10-CM | POA: Diagnosis not present

## 2022-12-14 DIAGNOSIS — R7302 Impaired glucose tolerance (oral): Secondary | ICD-10-CM | POA: Diagnosis not present

## 2022-12-21 ENCOUNTER — Other Ambulatory Visit: Payer: Self-pay | Admitting: Urology

## 2022-12-21 DIAGNOSIS — N401 Enlarged prostate with lower urinary tract symptoms: Secondary | ICD-10-CM

## 2022-12-30 ENCOUNTER — Ambulatory Visit
Admission: RE | Admit: 2022-12-30 | Discharge: 2022-12-30 | Disposition: A | Discharge status: Home / Self Care | Payer: BC Managed Care – PPO | Source: Ambulatory Visit | Attending: Urology | Admitting: Urology

## 2022-12-30 DIAGNOSIS — N401 Enlarged prostate with lower urinary tract symptoms: Secondary | ICD-10-CM

## 2022-12-30 DIAGNOSIS — R3914 Feeling of incomplete bladder emptying: Secondary | ICD-10-CM | POA: Diagnosis not present

## 2022-12-30 HISTORY — PX: IR RADIOLOGIST EVAL & MGMT: IMG5224

## 2022-12-30 NOTE — Consult Note (Signed)
Chief Complaint: Lower urinary symptoms, BPH  Referring Physician(s): Bell,Eugene D III  History of Present Illness: Isaiah [name], Isaiah [name] is a 62 y.o. male presenting to VIR clinic as scheduled consultation, kindly referred by Dr. Alvester Morin, for evaluation of candidacy for possible PAE.   Dr. Gwendolyn Grant tells me that he has been dealing with worsening urinary symptoms over the past few years.  His main complaints are initiating a stream and some double voiding after bowel movement/incomplete emptying.  Also, he had to decrease frequency of his medical treatment (tamsulosin) for the complaint of anejaculation.   His IPSS in office today is: 3, 3, 3, 1, 4, 3, 1 = 18 moderate.  His QoL is 3/6 mixed.   He denies history of hematuria, retention or frequent UTI.    Dr. Shannan Harper notes show results of UroCuff which show result of Obstructed.  He has prior MRI and prostate bx performed, for PSA levels that have been intermittently elevated.  Bx was negative.  MRI negative. Using the bullet formula with diameters from the prostate, I estimate a volume of 73g.    He is a non-smoker/never smoker.   He is taking xarelto 15mg  daily for a history of thrombophilia/lupus anti-coagulant. He says he came of [facility] for ~4 days for a prior back surgery and had VTE event.  He is very hesitant to withdraw from his life-long prescription.   Past Medical History:  Diagnosis Date   Arthritis    Blood dyscrasia    lupus anticoagulant   Chronic kidney disease    stage 2   Diastolic dysfunction    Grade I per echo in 2/12   DVT (deep venous thrombosis) (HCC) 2000's   "both legs in the calves; left went all the way up to my groin" (09/17/2012)   Dysrhythmia    GERD (gastroesophageal reflux disease)    History of bronchitis    Hypercoagulation syndrome (HCC)    secondary to circulating lupus anticoagulant   Hyperlipidemia    Hypertension    Hypothyroidism    "wiped out from taking amiodarone" (09/17/2012)   Lyme  disease    history of    Obesity (BMI 30-39.9) 07/17/2014   PAF (paroxysmal atrial fibrillation) (HCC)    no recurrence since Feb 2012   Pneumonia    PONV (postoperative nausea and vomiting)    likes scopolomanine patch   Pulmonary embolism (HCC) 09/17/2012   "just today; 3 on the right; 2 on the left" (09/17/2012)   Situational stress    Sleep apnea    uses CPAP    Past Surgical History:  Procedure Laterality Date   CARDIOVASCULAR STRESS TEST  07/17/2009   EF 59%   CARDIOVERSION     DISTAL BICEPS TENDON REPAIR Right 2011   INGUINAL HERNIA REPAIR Right 1992   INGUINAL HERNIA REPAIR Left 2013   INSERTION OF MESH N/A 05/14/2015   Procedure: INSERTION OF MESH;  Surgeon: Avel Peace, MD;  Location: [facility];  Service: General;  Laterality: N/A;   SHOULDER ARTHROSCOPY W/ ROTATOR CUFF REPAIR Left 09/04/2012   SHOULDER SURGERY     left   TOTAL HIP ARTHROPLASTY Right 11/26/2014   Procedure: RIGHT TOTAL HIP ARTHROPLASTY ANTERIOR APPROACH;  Surgeon: Ollen Gross, MD;  Location: [facility];  Service: Orthopedics;  Laterality: Right;   TOTAL HIP ARTHROPLASTY Left 01/30/2019   Procedure: TOTAL HIP ARTHROPLASTY ANTERIOR APPROACH;  Surgeon: Ollen Gross, MD;  Location: [facility];  Service: Orthopedics;  Laterality: Left;   TRANSTHORACIC ECHOCARDIOGRAM  03/05/2010   EF 60-65%   UMBILICAL HERNIA REPAIR N/A 05/14/2015   Procedure: HERNIA REPAIR UMBILICAL ADULT WITH MESH ;  Surgeon: Avel Peace, MD;  Location: [facility];  Service: General;  Laterality: N/A;   WISDOM TOOTH EXTRACTION     lower 2    Allergies: Crestor [rosuvastatin calcium] and Lipitor [atorvastatin calcium]  Medications: Prior to Admission medications   Medication Sig Start Date End Date Taking? Authorizing Provider  ALPRAZolam Prudy Feeler) 0.25 MG tablet Take 1 tablet by mouth 2 (two) times daily as needed. 04/21/21   [provider]  amLODipine (NORVASC) 5 MG tablet Take 1 tablet (5 mg total) by mouth daily. 02/18/22    Swinyer, Zachary George, NP  Evolocumab (REPATHA SURECLICK) 140 MG/ML SOAJ Inject 140 mg into the skin every 14 (fourteen) days. 06/21/22   [name], Deloris Ping, MD  ezetimibe (ZETIA) 10 MG tablet Take 1 tablet (10 mg total) by mouth daily. 02/18/22 02/19/23  Swinyer, Zachary George, NP  lisinopril (ZESTRIL) 20 MG tablet Take 0.5 tablets (10 mg total) by mouth daily. 08/24/22 08/25/23  Swinyer, Zachary George, NP  metoprolol succinate (TOPROL-XL) 25 MG 24 hr tablet Take one (1) tablet by mouth (25 mg ) daily. 02/18/22   Swinyer, Zachary George, NP  omega-3 acid ethyl esters (LOVAZA) 1 g capsule 2 tab po BID 02/24/10   [provider]  omeprazole (PRILOSEC) 20 MG capsule Take 1 capsule (20 mg total) by mouth daily. 03/18/14   Azalia Bilis, MD  propafenone (RYTHMOL SR) 325 MG 12 hr capsule Take 1 capsule (325 mg total) by mouth 2 (two) times daily. 02/18/22   Swinyer, Zachary George, NP  SYNTHROID 150 MCG tablet Take 300 mcg by mouth daily.  07/07/14   [provider]  tadalafil (CIALIS) 5 MG tablet Take 5 mg by mouth daily. 04/20/21   [provider]  tamsulosin (FLOMAX) 0.4 MG CAPS capsule Take 0.4 mg by mouth every evening.  01/21/19   [provider]  XARELTO 20 MG TABS tablet TAKE 1 TABLET BY MOUTH DAILY 10/04/22   [name], Deloris Ping, MD     Family History  Problem Relation Age of Onset   Hypertension Mother    Hyperlipidemia Mother    Cancer Mother        breast   Hypertension Father    Cancer Brother        prostate   Hypertension Brother    Heart attack Maternal Grandfather     Social History   Socioeconomic History   Marital status: Divorced    Spouse name: Not on file   Number of children: Not on file   Years of education: Not on file   Highest education level: Not on file  Occupational History   Not on file  Tobacco Use   Smoking status: Never   Smokeless tobacco: Never  Vaping Use   Vaping status: Never Used  Substance and Sexual Activity   Alcohol use: Yes    Alcohol/week:  4.0 - 8.0 standard drinks of alcohol    Types: 2 - 4 Cans of beer, 2 - 4 Shots of liquor per week    Comment: 09/17/2012 "1-2 beers and 1-2 tequila on Fri and Sat nights"   Drug use: No   Sexual activity: Yes  Other Topics Concern   Not on file  Social History Narrative   Not on file   Social Drivers of Health   Financial Resource Strain: Low Risk  (05/11/2021)  Received from [facility]   Overall Financial Resource Strain (CARDIA)    Difficulty of Paying Living Expenses: Not hard at all  Food Insecurity: No Food Insecurity (05/11/2021)   Received from [facility], [facility]   Hunger Vital Sign    Worried About Running Out of Food in the Last Year: Never true    Ran Out of Food in the Last Year: Never true  Transportation Needs: Not on file  Physical Activity: Insufficiently Active (05/11/2021)   Received from [facility], [facility]   Exercise Vital Sign    Days of Exercise per Week: 3 days    Minutes of Exercise per Session: 20 min  Stress: No Stress Concern Present (05/11/2021)   Received from [facility], [facility] - Occupational Stress Questionnaire    Feeling of Stress : Only a little  Social Connections: Unknown (05/30/2022)   Received from [facility], [facility]   Social Network    Social Network: Not on file      Review of Systems: A 12 point ROS discussed and pertinent positives are indicated in the HPI above.  All other systems are negative.  Review of Systems  Vital Signs: BP (!) 143/79   Pulse 62   Temp 97.9 F (36.6 C)   Resp 20   SpO2 99%     Physical Exam General: 62 yo male appearing stated age.  Well-developed, well-nourished.  No distress. HEENT: Atraumatic, normocephalic.  Conjugate gaze, extra-ocular motor intact. No scleral icterus or scleral injection. No lesions on external ears, nose, lips, or gums.  Oral mucosa moist, pink.  Neck: Symmetric with no goiter  enlargement.  Chest/Lungs:  Symmetric chest with inspiration/expiration.  No labored breathing.  Clear to auscultation with no wheezes, rhonchi, or rales.  Heart:  RRR, with no third heart sounds appreciated. No JVD appreciated.  Abdomen:  Soft, NT/ND, with + bowel sounds.   Genito-urinary: Deferred Neurologic: Alert & Oriented to person, place, and time.   Normal affect and insight.  Appropriate questions.  Moving all 4 extremities with gross sensory intact.  Pulse Exam:  No bruit appreciated.  Palpable radial pulses.  Palpable popliteal pulse  Mallampati Score:     Imaging: No results found.  Labs:  CBC: Recent Labs    02/18/22 0857 08/19/22 0923  WBC 4.7 4.3  HGB 15.5 15.1  HCT 47.1 47.6  PLT 227 218    COAGS: No results for input(s): "INR", "APTT" in the last 8760 hours.  BMP: Recent Labs    02/18/22 0857 08/19/22 0923 10/14/22 1210  NA 138 137 136  K 4.3 4.4 4.4  CL 101 101 99  CO2 23 24 24   GLUCOSE 91 96 83  BUN 18 25 16   CALCIUM 9.6 9.5 9.4  CREATININE 1.40* 1.80* 1.23    LIVER FUNCTION TESTS: Recent Labs    02/18/22 0857 08/19/22 0923  BILITOT 0.8 0.4  AST 31 37  ALT 26 37  ALKPHOS 78 79  PROT 7.1 6.7  ALBUMIN 4.4 4.2    TUMOR MARKERS: No results for input(s): "AFPTM", "CEA", "CA199", "CHROMGRNA" in the last 8760 hours.  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    His IPSS of 18, with QoL 3/6.    His estimated prostate size  approximately 73g based on MRI diameters and bullet equation.  I estimate about 200 of intra vesicle protrusion.   I had a lengthy discussion with Dr. Gwendolyn Grant regarding the relevant anatomy of the prostate to the lower urinary system, the ubiquitous enlargement of the prostate over time/BPH, the resulting LUTS that can be attributable to the BPH, and scope of the issue, (generally accepted to be  about 70% of men > 64yrs, with ~25% or more of men >70 with moderate/severe LUTS and decreased QoL.)    We briefly discussed the medical therapy for treatment, including conservative measures (alpha-blockers/5-ARI), traditional surgical therapies such as TURP, formal prostatectomy, and also the expanding space of minimally invasive therapies/MIST (ex. rezum, uro-lift, holep/thulep, greenlight).    Currently he is treated with medical therapy with incomplete relief.    Regarding PAE specifically, we discussed the theory of embolization shrinking the gland, the logistics of PAE with VIR, (typically an outpatient procedure lasting 2-4 hours with moderate sedation), and the side-effect profile (namely the post-embolization syndrome which is expected to last about 7-10 days requiring medical therapy/support).    We discussed risk profile, including: bleeding, infection, arterial injury, contrast reaction, organ failure, non-target embolization, local organ injury, need for further surgery/procedure, acute urinary retention with need for catheter, inability to treat during the intention-to-treat angiogram, cardiopulmonary collapse, death.  Our literature cites a major risk profile of 0.5%.     He wishes to have a procedure for relief.    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  For surgery, he would need be taken of his [facility], and he is concerned for recurrent DVT/PE, which has happened to him in the past.   While we ordinarily would hold 2 days before arterial access, I do think reasonable to mitigate risk for recurrent TIA by holding only 1 pre-op dose of xarelto the night before, performing the procedure, and then restarting.    He understands.  He is interested in pursuing PAE.    Plan: - We will schedule CTA pelvis to evaluate arterial anatomy.  - plan for PAE after CTA complete and reviewed, with Dr.  Loreta Ave at [facility].  Please schedule on a Thursday with Dr. 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  - Plan for 1 dose of xarelto hold on the evening before the PAE procedure, based on his risk for VTE.   - continue current care.   Thank you for this interesting consult.  I greatly enjoyed meeting Isaiah [name], Isaiah [name] and look forward to participating in their care.  A copy of this report was sent to the requesting provider on this date.  Electronically Signed: Gilmer Mor 12/30/2022, 2:23 PM   I spent a total of  60 Minutes   in face to face in clinical consultation, greater than 50% of which was counseling/coordinating care for LUTS/BPH, possible angiogram and prostate artery embolization.

## 2023-01-02 ENCOUNTER — Other Ambulatory Visit: Payer: Self-pay | Admitting: Interventional Radiology

## 2023-01-02 ENCOUNTER — Telehealth: Payer: Self-pay | Admitting: *Deleted

## 2023-01-02 DIAGNOSIS — N401 Enlarged prostate with lower urinary tract symptoms: Secondary | ICD-10-CM

## 2023-01-02 NOTE — Telephone Encounter (Signed)
   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

## 2023-01-03 DIAGNOSIS — I340 Nonrheumatic mitral (valve) insufficiency: Secondary | ICD-10-CM | POA: Insufficient documentation

## 2023-01-03 NOTE — Telephone Encounter (Signed)
Patient with diagnosis of A Fib and PE and DVT on Xarelto for anticoagulation. Had DVT after previous back surgery.  Procedure: PROSTATE ARTERY EMBOLIZATION  Date of procedure: TBD   CHA2DS2-VASc Score = 3  This indicates a 3.2% annual risk of stroke. The patient's score is based upon: CHF History: 1 HTN History: 1 Diabetes History: 0 Stroke History: 0 Vascular Disease History: 1 Age Score: 0 Gender Score: 0    CrCl 103 mL/min Platelet count 218K  Request is only for a 1 day Xarelto hold. However due to patient's previous DVT during surgical procedure, will route to Dr Elease Hashimoto for input.   **This guidance is not considered finalized until pre-operative APP has relayed final recommendations.**

## 2023-01-05 ENCOUNTER — Telehealth: Payer: Self-pay | Admitting: Pharmacy Technician

## 2023-01-05 ENCOUNTER — Other Ambulatory Visit (HOSPITAL_COMMUNITY): Payer: Self-pay

## 2023-01-05 ENCOUNTER — Telehealth: Payer: Self-pay | Admitting: Cardiovascular Disease

## 2023-01-05 NOTE — Telephone Encounter (Signed)
Pharmacy Patient Advocate Encounter   Received notification from Pt Calls Messages that prior authorization for repatha is required/requested.   Insurance verification completed.   The patient is insured through [facility] .   Per test claim: PA required; PA submitted to above mentioned insurance via CoverMyMeds Key/confirmation #/EOC [id number] Status is pending

## 2023-01-05 NOTE — Telephone Encounter (Signed)
  Pt c/o medication issue:  1. Name of Medication:   Evolocumab (REPATHA SURECLICK) 140 MG/ML SOAJ    2. How are you currently taking this medication (dosage and times per day)? Inject 140 mg into the skin every 14 (fourteen) days.   3. Are you having a reaction (difficulty breathing--STAT)? No   4. What is your medication issue? Isaiah [name] [facility] calling to follow up prior auth for pt's repatha. He said, they sent the request twice and also sent a request through cover my meds. He gave key code [id number].

## 2023-01-05 NOTE — Telephone Encounter (Signed)
Pharmacy Patient Advocate Encounter  Received notification from [facility] that Prior Authorization for repatha has been APPROVED from 01/05/23 to 01/05/24   PA #/Case ID/Reference #: [id number]

## 2023-01-05 NOTE — Telephone Encounter (Signed)
[facility] made aware of the approval

## 2023-01-06 ENCOUNTER — Ambulatory Visit (HOSPITAL_COMMUNITY)
Admission: RE | Admit: 2023-01-06 | Discharge: 2023-01-06 | Disposition: A | Discharge status: Home / Self Care | Payer: BC Managed Care – PPO | Source: Ambulatory Visit | Attending: Interventional Radiology | Admitting: Interventional Radiology

## 2023-01-06 ENCOUNTER — Other Ambulatory Visit: Payer: BC Managed Care – PPO

## 2023-01-06 DIAGNOSIS — N401 Enlarged prostate with lower urinary tract symptoms: Secondary | ICD-10-CM | POA: Insufficient documentation

## 2023-01-06 DIAGNOSIS — N139 Obstructive and reflux uropathy, unspecified: Secondary | ICD-10-CM | POA: Diagnosis not present

## 2023-01-06 DIAGNOSIS — K5730 Diverticulosis of large intestine without perforation or abscess without bleeding: Secondary | ICD-10-CM | POA: Diagnosis not present

## 2023-01-06 MED ORDER — IOHEXOL 350 MG/ML SOLN
100.0000 mL | Freq: Once | INTRAVENOUS | Status: AC | PRN
  Administered 2023-01-06: 100 mL via INTRAVENOUS

## 2023-01-06 MED ORDER — SODIUM CHLORIDE (PF) 0.9 % IJ SOLN
INTRAMUSCULAR | Status: AC
  Filled 2023-01-06: qty 50

## 2023-01-09 NOTE — Telephone Encounter (Signed)
   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 The following recommendations have been made:   Pt has a history of PE following a previous procedure  Pt may hold his Xarelto for 1 day as requested by his urologist  Please bridge with Lovenox ( even if it is just for 1 or 2 doses) to reduce his risk of subsequent pulmonary embolus    I will route this recommendation to the requesting party via Epic fax function and remove from pre-op pool.  Please call with questions.  Denyce Robert, NP 01/09/2023, 8:36 AM

## 2023-01-13 DIAGNOSIS — G4733 Obstructive sleep apnea (adult) (pediatric): Secondary | ICD-10-CM | POA: Diagnosis not present

## 2023-01-24 ENCOUNTER — Encounter: Payer: Self-pay | Admitting: Interventional Radiology

## 2023-01-24 ENCOUNTER — Other Ambulatory Visit (HOSPITAL_COMMUNITY): Payer: Self-pay | Admitting: Interventional Radiology

## 2023-01-24 DIAGNOSIS — N138 Other obstructive and reflux uropathy: Secondary | ICD-10-CM

## 2023-01-24 DIAGNOSIS — N401 Enlarged prostate with lower urinary tract symptoms: Secondary | ICD-10-CM

## 2023-01-31 NOTE — Telephone Encounter (Signed)
 Will route to preoperative pool so that they can call surgeon.

## 2023-01-31 NOTE — Telephone Encounter (Signed)
   Patient Name: Isaiah [name], Isaiah [name]  DOB: January 24, 1960 MRN: [id number]  Primary Cardiologist: Aleene Passe, MD  Chart reviewed as part of pre-operative protocol coverage. Given past medical history and time since last visit, based on ACC/AHA guidelines, [place], Isaiah [name] is at acceptable risk for the planned procedure without further cardiovascular testing.    CHA2DS2-VASc Score = 3  This indicates a 3.2% annual risk of stroke. The patient's score is based upon: CHF History: 1 HTN History: 1 Diabetes History: 0 Stroke History: 0 Vascular Disease History: 1 Age Score: 0 Gender Score: 0     CrCl 103 mL/min Platelet count 218K   Request is only for a 1 day Xarelto  hold.   Please call with questions.  Lamarr Satterfield, NP 01/31/2023, 9:51 AM

## 2023-01-31 NOTE — Telephone Encounter (Signed)
 Per Vicky at [facility], procedure is scheduled for 03/02/23.

## 2023-02-01 NOTE — Telephone Encounter (Signed)
 Called patient to discuss Lovenox  bridge. LMOM

## 2023-02-03 ENCOUNTER — Telehealth: Payer: Self-pay | Admitting: Cardiovascular Disease

## 2023-02-03 MED ORDER — PROPAFENONE HCL ER 325 MG PO CP12: 325.0000 mg | ORAL_CAPSULE | Freq: Two times a day (BID) | ORAL | 2 refills | Status: AC

## 2023-02-03 NOTE — Telephone Encounter (Signed)
*  STAT* If patient is at the pharmacy, call can be transferred to refill team.   1. Which medications need to be refilled? (please list name of each medication and dose if known)  propafenone (RYTHMOL SR) 325 MG 12 hr capsule  2. Which pharmacy/location (including street and city if local pharmacy) is medication to be sent to? [facility] - [place], [place] - [address] Phone: [phone]  Fax: [phone]     3. Do they need a 30 day or 90 day supply? 90

## 2023-02-03 NOTE — Telephone Encounter (Signed)
Pt's medication was sent to pt's pharmacy as requested. Confirmation received.  °

## 2023-02-13 DIAGNOSIS — G4733 Obstructive sleep apnea (adult) (pediatric): Secondary | ICD-10-CM | POA: Diagnosis not present

## 2023-02-23 ENCOUNTER — Other Ambulatory Visit: Payer: Self-pay | Admitting: Nurse Practitioner

## 2023-02-24 NOTE — Telephone Encounter (Signed)
 I spoke to Dr. Alveta about this patient. Typically would give the last dose of enoxaparin  24 hour before procedure. However, patient only needs to hold Xarelto  24hr prior. Want to make sure he has appropriate washout. We decided a prophylaxis dose of enoxaparin  40mg  would be acceptable.  LVM for patient to call back

## 2023-02-28 ENCOUNTER — Other Ambulatory Visit: Payer: Self-pay | Admitting: Nurse Practitioner

## 2023-02-28 NOTE — Telephone Encounter (Signed)
Patient called back and stated that his procedure has been canceled and he would get back in touch with me when its rescheduled.

## 2023-03-02 ENCOUNTER — Encounter (HOSPITAL_COMMUNITY): Payer: Self-pay

## 2023-03-02 ENCOUNTER — Other Ambulatory Visit (HOSPITAL_COMMUNITY): Payer: BC Managed Care – PPO

## 2023-03-02 ENCOUNTER — Ambulatory Visit (HOSPITAL_COMMUNITY): Payer: BC Managed Care – PPO

## 2023-03-03 ENCOUNTER — Telehealth: Payer: Self-pay | Admitting: Cardiovascular Disease

## 2023-03-03 ENCOUNTER — Other Ambulatory Visit: Payer: Self-pay | Admitting: *Deleted

## 2023-03-03 DIAGNOSIS — I480 Paroxysmal atrial fibrillation: Secondary | ICD-10-CM

## 2023-03-03 MED ORDER — RIVAROXABAN 20 MG PO TABS: 20.0000 mg | ORAL_TABLET | Freq: Every day | ORAL | 1 refills | Status: DC

## 2023-03-03 NOTE — Telephone Encounter (Signed)
Xarelto 20mg  refill request received. Pt is 63 years old, weight-114.1kg, Crea-1.23 on 10/14/22, last seen by Eligha Bridegroom on 08/19/22, Diagnosis-afib, dvt, pe, CrCl-103.07 mL/min; Dose is appropriate based on dosing criteria. Will send in refill to requested pharmacy.

## 2023-03-03 NOTE — Telephone Encounter (Signed)
*  STAT* If patient is at the pharmacy, call can be transferred to refill team.   1. Which medications need to be refilled? (please list name of each medication and dose if known)   XARELTO 20 MG TABS tablet     2. Would you like to learn more about the convenience, safety, & potential cost savings by using the [facility]? No    3. Are you open to using the [facility] (Type [facility]. No   4. Which pharmacy/location (including street and city if local pharmacy) is medication to be sent to? [facility] - [place], [place] - [address]   5. Do they need a 30 day or 90 day supply? 90 day supply

## 2023-03-16 ENCOUNTER — Encounter: Payer: Self-pay | Admitting: Cardiovascular Disease

## 2023-03-16 DIAGNOSIS — G4733 Obstructive sleep apnea (adult) (pediatric): Secondary | ICD-10-CM | POA: Diagnosis not present

## 2023-03-16 NOTE — Progress Notes (Unsigned)
 This encounter was created in error - please disregard.

## 2023-03-17 ENCOUNTER — Ambulatory Visit: Payer: BC Managed Care – PPO | Attending: Cardiovascular Disease | Admitting: Cardiovascular Disease

## 2023-04-11 DIAGNOSIS — E039 Hypothyroidism, unspecified: Secondary | ICD-10-CM | POA: Diagnosis not present

## 2023-04-11 DIAGNOSIS — N401 Enlarged prostate with lower urinary tract symptoms: Secondary | ICD-10-CM | POA: Diagnosis not present

## 2023-04-11 DIAGNOSIS — E785 Hyperlipidemia, unspecified: Secondary | ICD-10-CM | POA: Diagnosis not present

## 2023-04-11 DIAGNOSIS — R7302 Impaired glucose tolerance (oral): Secondary | ICD-10-CM | POA: Diagnosis not present

## 2023-04-19 ENCOUNTER — Other Ambulatory Visit: Payer: Self-pay | Admitting: Cardiovascular Disease

## 2023-04-19 DIAGNOSIS — E782 Mixed hyperlipidemia: Secondary | ICD-10-CM

## 2023-04-21 ENCOUNTER — Other Ambulatory Visit: Payer: Self-pay | Admitting: Nurse Practitioner

## 2023-05-23 ENCOUNTER — Other Ambulatory Visit: Payer: Self-pay | Admitting: Nurse Practitioner

## 2023-05-24 DIAGNOSIS — L570 Actinic keratosis: Secondary | ICD-10-CM | POA: Diagnosis not present

## 2023-05-24 DIAGNOSIS — L821 Other seborrheic keratosis: Secondary | ICD-10-CM | POA: Diagnosis not present

## 2023-05-24 DIAGNOSIS — D225 Melanocytic nevi of trunk: Secondary | ICD-10-CM | POA: Diagnosis not present

## 2023-05-24 DIAGNOSIS — L565 Disseminated superficial actinic porokeratosis (DSAP): Secondary | ICD-10-CM | POA: Diagnosis not present

## 2023-05-24 DIAGNOSIS — Z85828 Personal history of other malignant neoplasm of skin: Secondary | ICD-10-CM | POA: Diagnosis not present

## 2023-06-07 DIAGNOSIS — M545 Low back pain, unspecified: Secondary | ICD-10-CM | POA: Diagnosis not present

## 2023-06-07 DIAGNOSIS — M5126 Other intervertebral disc displacement, lumbar region: Secondary | ICD-10-CM | POA: Diagnosis not present

## 2023-06-07 DIAGNOSIS — Z6833 Body mass index (BMI) 33.0-33.9, adult: Secondary | ICD-10-CM | POA: Diagnosis not present

## 2023-06-10 DIAGNOSIS — M25551 Pain in right hip: Secondary | ICD-10-CM | POA: Diagnosis not present

## 2023-06-15 ENCOUNTER — Ambulatory Visit
Admission: RE | Admit: 2023-06-15 | Discharge: 2023-06-15 | Disposition: A | Discharge status: Home / Self Care | Source: Ambulatory Visit | Attending: Endocrinology | Admitting: Endocrinology

## 2023-06-15 ENCOUNTER — Other Ambulatory Visit: Payer: Self-pay | Admitting: Endocrinology

## 2023-06-15 DIAGNOSIS — K4090 Unilateral inguinal hernia, without obstruction or gangrene, not specified as recurrent: Secondary | ICD-10-CM

## 2023-06-15 MED ORDER — IOPAMIDOL (ISOVUE-300) INJECTION 61%
100.0000 mL | Freq: Once | INTRAVENOUS | Status: AC | PRN
  Administered 2023-06-15: 100 mL via INTRAVENOUS

## 2023-06-16 DIAGNOSIS — S76891A Other injury of other specified muscles, fascia and tendons at thigh level, right thigh, initial encounter: Secondary | ICD-10-CM | POA: Diagnosis not present

## 2023-06-22 ENCOUNTER — Telehealth: Payer: Self-pay | Admitting: *Deleted

## 2023-06-22 NOTE — Telephone Encounter (Signed)
   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 Elna Haggis Surgeon's Group or Practice Name:  [facility] Phone number:  [phone] Fax number:  [phone] OR [phone] ATTN: Landa Pine   Type of Clearance Requested:   - Medical  - Pharmacy:  Hold Rivaroxaban  (Xarelto )     Type of Anesthesia:  General    Additional requests/questions:    Princeton Broom   06/22/2023, 2:59 PM

## 2023-06-23 ENCOUNTER — Other Ambulatory Visit: Payer: Self-pay | Admitting: Nurse Practitioner

## 2023-06-23 NOTE — Telephone Encounter (Signed)
 Patient with diagnosis of A Fib and PE and DVT on Xarelto  for anticoagulation. Had DVT after previous back surgery.     Procedure: TRANSLAMINAR INJECTION  Date of procedure: Clearance 07/25/23  & 08/10/23    CHA2DS2-VASc Score = 3   This indicates a 3.2% annual risk of stroke. The patient's score is based upon: CHF History: 1 HTN History: 1 Diabetes History: 0 Stroke History: 0 Vascular Disease History: 1 Age Score: 0 Gender Score: 0     CrCl 86 mL/min Platelet count 218   Patient has not  had an Afib/aflutter ablation within the last 3 months or DCCV within the last 30 days   Had DVT after previous back surgery. Due to the patient's high risk when off anticoagulation during the last procedure(PROSTATE ARTERY EMBOLIZATION on 01/02/23), Dr. Alroy Aspen had previously recommended bridging therapy, even for a one-day hold. However, that procedure was ultimately cancelled.   Given the high bleeding risk associated with this upcoming procedure, a 3-day hold with Lovenox  bridging would be appropriate. Nevertheless, we will route the case to Dr. Alroy Aspen for input and final recommendation.   **This guidance is not considered finalized until pre-operative APP has relayed final recommendations.**

## 2023-06-26 DIAGNOSIS — M5126 Other intervertebral disc displacement, lumbar region: Secondary | ICD-10-CM | POA: Diagnosis not present

## 2023-07-12 DIAGNOSIS — M5126 Other intervertebral disc displacement, lumbar region: Secondary | ICD-10-CM | POA: Diagnosis not present

## 2023-07-25 ENCOUNTER — Other Ambulatory Visit: Payer: Self-pay | Admitting: Nurse Practitioner

## 2023-07-25 DIAGNOSIS — M5416 Radiculopathy, lumbar region: Secondary | ICD-10-CM | POA: Diagnosis not present

## 2023-07-25 DIAGNOSIS — M5116 Intervertebral disc disorders with radiculopathy, lumbar region: Secondary | ICD-10-CM | POA: Diagnosis not present

## 2023-07-25 NOTE — Progress Notes (Unsigned)
 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  Past Medical History    Past Medical History:  Diagnosis Date   Arthritis    Blood dyscrasia    lupus anticoagulant   Chronic kidney disease    stage 2   Diastolic dysfunction    Grade I per echo in 2/12   DVT (deep venous thrombosis) (HCC) 2000's   both legs in the calves; left went all the way up to my groin (09/17/2012)   Dysrhythmia    GERD (gastroesophageal reflux disease)    History of bronchitis    Hypercoagulation syndrome (HCC)    secondary to circulating lupus anticoagulant   Hyperlipidemia    Hypertension    Hypothyroidism    wiped out from taking amiodarone (09/17/2012)   Lyme disease    history of    Obesity (BMI 30-39.9) 07/17/2014   PAF (paroxysmal atrial fibrillation) (HCC)    no recurrence since Feb 2012   Pneumonia    PONV (postoperative nausea and vomiting)    likes scopolomanine patch   Pulmonary embolism (HCC) 09/17/2012   just today; 3 on the right; 2 on the left (09/17/2012)   Situational stress    Sleep apnea    uses CPAP   Past Surgical History:  Procedure Laterality Date   CARDIOVASCULAR STRESS TEST  07/17/2009   EF 59%   CARDIOVERSION     DISTAL BICEPS TENDON REPAIR Right 2011   INGUINAL HERNIA REPAIR Right 1992   INGUINAL HERNIA REPAIR Left 2013   INSERTION OF MESH N/A 05/14/2015   Procedure: INSERTION OF MESH;  Surgeon: Krystal Russell, MD;  Location: [facility];  Service: General;  Laterality: N/A;   IR RADIOLOGIST EVAL & MGMT  12/30/2022   SHOULDER ARTHROSCOPY W/ ROTATOR CUFF REPAIR Left 09/04/2012   SHOULDER SURGERY     left   TOTAL HIP ARTHROPLASTY Right 11/26/2014   Procedure: RIGHT  TOTAL HIP ARTHROPLASTY ANTERIOR APPROACH;  Surgeon: Dempsey Moan, MD;  Location: [facility];  Service: Orthopedics;  Laterality: Right;   TOTAL HIP ARTHROPLASTY Left 01/30/2019   Procedure: TOTAL HIP ARTHROPLASTY ANTERIOR APPROACH;  Surgeon: Moan Dempsey, MD;  Location: [facility];  Service: Orthopedics;  Laterality: Left;    TRANSTHORACIC ECHOCARDIOGRAM  03/05/2010   EF 60-65%   UMBILICAL HERNIA REPAIR N/A 05/14/2015   Procedure: HERNIA REPAIR UMBILICAL ADULT WITH MESH ;  Surgeon: Krystal Russell, MD;  Location: [facility];  Service: General;  Laterality: N/A;   WISDOM TOOTH EXTRACTION     lower 2    Allergies  Allergies  Allergen Reactions   Crestor [Rosuvastatin Calcium] Other (See Comments)    hepatitis   Lipitor [Atorvastatin Calcium] Other (See Comments)    Feels like someone hit him with 2x4    History of Present Illness    Isaiah [name], 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  He is a Education officer, community.  PMHx followed by Dr. Passe.  He was last seen by Dr. Passe 4/23.  During that time he noted that during a  previous back surgery he was not bridged with Lovenox  and developed a small DVT in his left leg.  Lovenox  was recommended for future surgeries.  He was maintained propafenone  for his paroxysmal atrial fibrillation.  65-month follow-up was planned.  He contacted the clinic on 10/23 with elevated BP.  Amlodipine  5 mg daily was added to his medication regimen.  He was last seen in clinic on 08/19/2022 by Rosaline Bane NP.  During that time he was doing well.  He was helping care for his aging parents.  This was stressful for him.  He was lifting weights 4 days/week and walking daily for exercise.  He reported he occasionally would get winded at the top of a hill.  He denied  orthopnea, PND, palpitations, chest pain, presyncope and syncope.  His blood pressure was well-controlled.  He denied bleeding issues.  He denied episodes of atrial fibrillation and awareness.  He presents to the clinic today for follow-up evaluation states***.  *** denies chest pain, shortness of breath, lower extremity edema, fatigue, palpitations, melena, hematuria, hemoptysis, diaphoresis, weakness, presyncope, syncope, orthopnea, and PND.  Paroxysmal atrial fibrillation-EKG today shows***.  Denies recent episodes of accelerated or irregular heartbeat.  Reports compliance with Xarelto .  Denies bleeding issues.  CHA2DS2-VASc score 3. Avoid triggers caffeine, chocolate, EtOH, dehydration etc. Continue metoprolol , propafenone   Aortic root dilation-noted to have a 42 mm aortic root dilation on previous echocardiogram. Maintaining good blood pressure control. Continue metoprolol  Repeat echocardiogram 8/25  Hyperlipidemia-LDL***. High-fiber diet Maintain physical activity Continue Repatha , ezetimibe , Lovaza   Mitral valve insufficiency-no increased dyspnea or activity intolerance.  Echocardiogram 08/26/2022 showed LVEF of 55-60% and mild mitral valve regurgitation with no evidence of mitral stenosis. Repeat echocardiogram when clinically indicated  Coronary artery disease-no chest pain today.  Denies episodes of exertional chest discomfort and anginal type symptoms. Continue Repatha , ezetimibe , Lovaza , metoprolol  Heart healthy low-sodium diet Maintain physical activity  Stage III CKD-creatinine 1.23 on 10/14/22.  Avoid nephrotoxic agents. Maintain p.o. hydration Follows with PCP   Disposition: Follow-up with Dr. Alveta, Ferry or me in 12 months.   Home Medications    Prior to Admission medications   Medication Sig Start Date End Date Taking? Authorizing Provider  ALPRAZolam  (XANAX ) 0.25 MG tablet Take 1 tablet by mouth 2 (two) times daily as needed. 04/21/21   [provider]  amLODipine  (NORVASC ) 5 MG tablet TAKE 1 TABLET BY MOUTH DAILY 04/21/23   Swinyer, Rosaline HERO, NP  Evolocumab  (REPATHA  SURECLICK) 140 MG/ML SOAJ ADMINISTER 1 ML UNDER THE SKIN EVERY 14 DAYS 04/19/23   [name], Aleene PARAS, MD  ezetimibe  (ZETIA ) 10 MG tablet TAKE 1 TABLET BY MOUTH DAILY 04/21/23   Swinyer, Rosaline HERO, NP  lisinopril  (ZESTRIL ) 20 MG tablet TAKE 1 TABLET BY MOUTH DAILY 02/28/23   [name], Aleene PARAS, MD  metoprolol  succinate (TOPROL -XL) 25 MG 24 hr tablet TAKE 1 TABLET BY MOUTH DAILY 05/23/23   Swinyer, Rosaline HERO, NP  omega-3 acid ethyl esters (LOVAZA ) 1 g capsule 2 tab po BID 02/24/10   [provider]  omeprazole  (PRILOSEC) 20 MG capsule Take 1 capsule (20 mg total) by mouth daily. 03/18/14   Baxter Drivers, MD  propafenone  (RYTHMOL  SR) 325 MG 12 hr capsule Take 1 capsule (325 mg total) by mouth 2 (two) times daily. 02/03/23   Swinyer, Rosaline HERO, NP  rivaroxaban  (XARELTO ) 20 MG TABS tablet Take 1 tablet (20 mg total) by mouth daily. 03/03/23   Swinyer, Rosaline HERO, NP  SYNTHROID  150 MCG tablet Take  300 mcg by mouth daily.  07/07/14   [provider]  tadalafil (CIALIS) 5 MG tablet Take 5 mg by mouth daily. 04/20/21   [provider]  tamsulosin  (FLOMAX ) 0.4 MG CAPS capsule Take 0.4 mg by mouth every evening.  01/21/19   [provider]    Family History    Family History  Problem Relation Age of Onset   Hypertension Mother    Hyperlipidemia Mother    Cancer Mother        breast   Hypertension Father    Cancer Brother        prostate   Hypertension Brother    Heart attack Maternal Grandfather    He indicated that his mother is alive. He indicated that his father is alive. He indicated that his brother is alive. He indicated that his maternal grandmother is deceased. He indicated that his maternal grandfather is deceased. He indicated that his paternal grandmother is deceased. He indicated that his paternal grandfather is deceased.  Social History    Social  History   Socioeconomic History   Marital status: Divorced    Spouse name: Not on file   Number of children: Not on file   Years of education: Not on file   Highest education level: Not on file  Occupational History   Not on file  Tobacco Use   Smoking status: Never   Smokeless tobacco: Never  Vaping Use   Vaping status: Never Used  Substance and Sexual Activity   Alcohol use: Yes    Alcohol/week: 4.0 - 8.0 standard drinks of alcohol    Types: 2 - 4 Cans of beer, 2 - 4 Shots of liquor per week    Comment: 09/17/2012 1-2 beers and 1-2 tequila on Fri and Sat nights   Drug use: No   Sexual activity: Yes  Other Topics Concern   Not on file  Social History Narrative   Not on file   Social Drivers of Health   Financial Resource Strain: Low Risk  (05/11/2021)   Received from [facility]   Overall Financial Resource Strain (CARDIA)    Difficulty of Paying Living Expenses: Not hard at all  Food Insecurity: No Food Insecurity (05/11/2021)   Received from [facility]   Hunger Vital Sign    Within the past 12 months, you worried that your food would run out before you got the money to buy more.: Never true    Within the past 12 months, the food you bought just didn't last and you didn't have money to get more.: Never true  Transportation Needs: Not on file  Physical Activity: Insufficiently Active (05/11/2021)   Received from [facility]   Exercise Vital Sign    On average, how many days per week do you engage in moderate to strenuous exercise (like a brisk walk)?: 3 days    On average, how many minutes do you engage in exercise at this level?: 20 min  Stress: No Stress Concern Present (05/11/2021)   Received from [facility] Occupational [facility] - Occupational Stress Questionnaire    Feeling of Stress : Only a little  Social Connections: Unknown (05/30/2022)   [facility] from [facility]   Social Network    Social Network: Not on file  Intimate  Partner Violence: Unknown (05/30/2022)   Received from [facility]   HITS    Physically Hurt: Not on file    Insult or Talk Down To: Not on  file    Threaten Physical Harm: Not on file    Scream or Curse: Not on file     Review of Systems    General:  No chills, fever, night sweats or weight changes.  Cardiovascular:  No chest pain, dyspnea on exertion, edema, orthopnea, palpitations, paroxysmal nocturnal dyspnea. Dermatological: No rash, lesions/masses Respiratory: No cough, dyspnea Urologic: No hematuria, dysuria Abdominal:   No nausea, vomiting, diarrhea, bright red blood per rectum, melena, or hematemesis Neurologic:  No visual changes, wkns, changes in mental status. All other systems reviewed and are otherwise negative except as noted above.  Physical Exam    VS:  There were no vitals taken for this visit. , BMI There is no height or weight on file to calculate BMI. GEN: Well nourished, well developed, in no acute distress. HEENT: normal. Neck: Supple, no JVD, carotid bruits, or masses. Cardiac: RRR, no murmurs, rubs, or gallops. No clubbing, cyanosis, edema.  Radials/DP/PT 2+ and equal bilaterally.  Respiratory:  Respirations regular and unlabored, clear to auscultation bilaterally. GI: Soft, nontender, nondistended, BS + x 4. MS: no deformity or atrophy. Skin: warm and dry, no rash. Neuro:  Strength and sensation are intact. Psych: Normal affect.  Accessory Clinical Findings    Recent Labs: 08/19/2022: ALT 37; Hemoglobin 15.1; Platelets 218 10/14/2022: BUN 16; Creatinine, Ser 1.23; Potassium 4.4; Sodium 136   Recent Lipid Panel    Component Value Date/Time   CHOL 113 08/19/2022 0923   TRIG 44 08/19/2022 0923   HDL 59 08/19/2022 0923   CHOLHDL 1.9 08/19/2022 0923   CHOLHDL 4.9 12/18/2015 0915   VLDL 13 12/18/2015 0915   LDLCALC 43 08/19/2022 0923    No BP recorded.  {Refresh Note OR Click here to enter BP  :1}***    ECG personally reviewed by me today- ***     Coronary CTA 08/18/2016 1) Calcium score 44 with calcium noted in proximal and mid LAD this is 72 nd percentile for age and sex   2) Essentially normal left dominant coronary arteries. Less than 30% calcified plaque in proximal and mid LAD   Echo 08/12/2016 Left ventricle: The cavity size was mildly dilated. Systolic    function was normal. The estimated ejection fraction was in the    range of 55% to 60%. Wall motion was normal; there were no    regional wall motion abnormalities. There was an increased    relative contribution of atrial contraction to ventricular    filling. Doppler parameters are consistent with abnormal left    ventricular relaxation (grade 1 diastolic dysfunction).  - Aorta: Aortic root dimension: 39 mm (ED).  - Aortic root: The aortic root was mildly dilated.  - Mitral valve: There was mild regurgitation.  - Right ventricle: The cavity size was mildly dilated. Wall    thickness was normal.  - Tricuspid valve: There was trivial regurgitation.   Echocardiogram 09/25/22  IMPRESSIONS   1. Left ventricular ejection fraction, by estimation, is 55 to 60%. The left ventricle has normal function. The left ventricle has no regional wall motion abnormalities. Left ventricular diastolic parameters are consistent with Grade I diastolic  dysfunction (impaired relaxation). The average left ventricular global longitudinal strain is -17.1 %. The global longitudinal strain is normal. 2. Right ventricular systolic function is normal. The right ventricular size is mildly enlarged. There is normal pulmonary artery systolic pressure. The estimated right ventricular systolic pressure is 19.6 mmHg. 3. The mitral valve is normal in structure. Mild mitral valve  regurgitation. No evidence of mitral stenosis. 4. The aortic valve is tricuspid. Aortic valve regurgitation is not visualized. No aortic stenosis is present. 5. Aortic dilatation noted. There is mild dilatation of the aortic root,  measuring 42 mm. 6. The inferior vena cava is dilated in size with >50% respiratory variability, suggesting right atrial pressure of 8 mmHg.  FINDINGS Left Ventricle: Left ventricular ejection fraction, by estimation, is 55 to 60%. The left ventricle has normal function. The left ventricle has no regional wall motion abnormalities. The average left ventricular global longitudinal strain is -17.1 %.  The global longitudinal strain is normal. The left ventricular internal cavity size was normal in size. There is no left ventricular hypertrophy. Left ventricular diastolic parameters are consistent with Grade I diastolic dysfunction (impaired  relaxation).  Right Ventricle: The right ventricular size is mildly enlarged. No increase in right ventricular wall thickness. Right ventricular systolic function is normal. There is normal pulmonary artery systolic pressure. The tricuspid regurgitant velocity is 1.70 m/s, and with an assumed right atrial pressure of 8 mmHg, the estimated right ventricular systolic pressure is 19.6 mmHg.  Left Atrium: Left atrial size was normal in size.  Right Atrium: Right atrial size was normal in size.  Pericardium: There is no evidence of pericardial effusion.  Mitral Valve: The mitral valve is normal in structure. Mild mitral annular calcification. Mild mitral valve regurgitation. No evidence of mitral valve stenosis.  Tricuspid Valve: The tricuspid valve is normal in structure. Tricuspid valve regurgitation is trivial. No evidence of tricuspid stenosis.  Aortic Valve: The aortic valve is tricuspid. Aortic valve regurgitation is not visualized. No aortic stenosis is present.  Pulmonic Valve: The pulmonic valve was normal in structure. Pulmonic valve regurgitation is not visualized. No evidence of pulmonic stenosis.  Aorta: Aortic dilatation noted. There is mild dilatation of the aortic root, measuring 42 mm.  Venous: The inferior vena cava is dilated in size with  greater than 50% respiratory variability, suggesting right atrial pressure of 8 mmHg.  IAS/Shunts: No atrial level shunt detected by color flow Doppler.    CHA2DS2-VASc Score = 3  {This indicates a 3.2% annual risk of stroke. The patient's score is based upon: CHF History: 1 HTN History: 1 Diabetes History: 0 Stroke History: 0 Vascular Disease History: 1 Age Score: 0 Gender Score: 0 point   Assessment & Plan   1.  ***   Josefa HERO. Ellamae Lybeck NP-C     07/25/2023, 10:09 AM [facility] [address] Office [phone] Fax [phone]    I spent***minutes examining this patient, reviewing medications, and using patient centered shared decision making involving their cardiac care.   I spent  20 minutes reviewing past medical history,  medications, and prior cardiac tests.

## 2023-07-26 DIAGNOSIS — M5126 Other intervertebral disc displacement, lumbar region: Secondary | ICD-10-CM | POA: Diagnosis not present

## 2023-07-27 ENCOUNTER — Ambulatory Visit: Attending: General Practice | Admitting: General Practice

## 2023-07-27 ENCOUNTER — Encounter: Payer: Self-pay | Admitting: General Practice

## 2023-07-27 VITALS — BP 136/82 | HR 69 | Ht 75.5 in | Wt 272.6 lb

## 2023-07-27 DIAGNOSIS — I340 Nonrheumatic mitral (valve) insufficiency: Secondary | ICD-10-CM

## 2023-07-27 DIAGNOSIS — I10 Essential (primary) hypertension: Secondary | ICD-10-CM

## 2023-07-27 DIAGNOSIS — I2510 Atherosclerotic heart disease of native coronary artery without angina pectoris: Secondary | ICD-10-CM

## 2023-07-27 DIAGNOSIS — I77810 Thoracic aortic ectasia: Secondary | ICD-10-CM | POA: Diagnosis not present

## 2023-07-27 DIAGNOSIS — E782 Mixed hyperlipidemia: Secondary | ICD-10-CM

## 2023-07-27 DIAGNOSIS — I480 Paroxysmal atrial fibrillation: Secondary | ICD-10-CM | POA: Diagnosis not present

## 2023-07-27 DIAGNOSIS — N1831 Chronic kidney disease, stage 3a: Secondary | ICD-10-CM

## 2023-07-27 MED ORDER — REPATHA SURECLICK 140 MG/ML ~~LOC~~ SOAJ: 140.0000 mg | SUBCUTANEOUS | 6 refills | Status: AC

## 2023-07-27 MED ORDER — RIVAROXABAN 20 MG PO TABS: 20.0000 mg | ORAL_TABLET | Freq: Every day | ORAL | 3 refills | Status: AC

## 2023-07-27 MED ORDER — LISINOPRIL 20 MG PO TABS: 20.0000 mg | ORAL_TABLET | Freq: Every day | ORAL | 3 refills | Status: AC

## 2023-07-27 MED ORDER — AMLODIPINE BESYLATE 5 MG PO TABS: 5.0000 mg | ORAL_TABLET | Freq: Every day | ORAL | 3 refills | Status: AC

## 2023-07-27 MED ORDER — EZETIMIBE 10 MG PO TABS: 10.0000 mg | ORAL_TABLET | Freq: Every day | ORAL | 3 refills | Status: DC

## 2023-07-27 MED ORDER — METOPROLOL SUCCINATE ER 25 MG PO TB24: 25.0000 mg | ORAL_TABLET | Freq: Every day | ORAL | 3 refills | Status: DC

## 2023-07-27 NOTE — Patient Instructions (Signed)
 Medication Instructions:  NO CHANGES *If you need a refill on your cardiac medications before your next appointment, please call your pharmacy*  Lab Work: NO LABS If you have labs (blood work) drawn today and your tests are completely normal, you will receive your results only by: MyChart Message (if you have MyChart) OR A paper copy in the mail If you have any lab test that is abnormal or we need to change your treatment, we will call you to review the results.  Testing/Procedures:[address] - IN SEPT 2025 Your physician has requested that you have an echocardiogram. Echocardiography is a painless test that uses sound waves to create images of your heart. It provides your doctor with information about the size and shape of your heart and how well your heart's chambers and valves are working. This procedure takes approximately one hour. There are no restrictions for this procedure. Please do NOT wear cologne, perfume, aftershave, or lotions (deodorant is allowed). Please arrive 15 minutes prior to your appointment time.  Please note: We ask at that you not bring children with you during ultrasound (echo/ vascular) testing. Due to room size and safety concerns, children are not allowed in the ultrasound rooms during exams. Our front office staff cannot provide observation of children in our lobby area while testing is being conducted. An adult accompanying a patient to their appointment will only be allowed in the ultrasound room at the discretion of the ultrasound technician under special circumstances. We apologize for any inconvenience.   Follow-Up: At [facility], you and your health needs are our priority.  As part of our continuing mission to provide you with exceptional heart care, our providers are all part of one team.  This team includes your primary Cardiologist (physician) and Advanced Practice Providers or APPs (Physician Assistants and Nurse Practitioners) who all  work together to provide you with the care you need, when you need it.  Your next appointment:   6 month(s)  Provider:   Josefa Beauvais, NP  Other Instructions Check blood pressure daily and keep record of readings, bring to next office visit.  Maintain physical activity.  [facility] [place]:

## 2023-08-07 DIAGNOSIS — M5126 Other intervertebral disc displacement, lumbar region: Secondary | ICD-10-CM | POA: Diagnosis not present

## 2023-08-14 DIAGNOSIS — M5126 Other intervertebral disc displacement, lumbar region: Secondary | ICD-10-CM | POA: Diagnosis not present

## 2023-08-14 NOTE — Telephone Encounter (Signed)
 Were late providing Lovenox  bridge schedule. So surgeon and pt had decided to hold DOAC just for 24 hrs without bridge. Spoke to patient both injection were done without any problem.

## 2023-08-16 DIAGNOSIS — E039 Hypothyroidism, unspecified: Secondary | ICD-10-CM | POA: Diagnosis not present

## 2023-08-16 DIAGNOSIS — R7302 Impaired glucose tolerance (oral): Secondary | ICD-10-CM | POA: Diagnosis not present

## 2023-08-16 DIAGNOSIS — R972 Elevated prostate specific antigen [PSA]: Secondary | ICD-10-CM | POA: Diagnosis not present

## 2023-08-22 DIAGNOSIS — M5414 Radiculopathy, thoracic region: Secondary | ICD-10-CM | POA: Diagnosis not present

## 2023-08-22 DIAGNOSIS — M5114 Intervertebral disc disorders with radiculopathy, thoracic region: Secondary | ICD-10-CM | POA: Diagnosis not present

## 2023-08-28 DIAGNOSIS — M5126 Other intervertebral disc displacement, lumbar region: Secondary | ICD-10-CM | POA: Diagnosis not present

## 2023-09-11 ENCOUNTER — Emergency Department (HOSPITAL_BASED_OUTPATIENT_CLINIC_OR_DEPARTMENT_OTHER)

## 2023-09-11 ENCOUNTER — Encounter (HOSPITAL_BASED_OUTPATIENT_CLINIC_OR_DEPARTMENT_OTHER): Payer: Self-pay | Admitting: Emergency Medicine

## 2023-09-11 ENCOUNTER — Observation Stay (HOSPITAL_BASED_OUTPATIENT_CLINIC_OR_DEPARTMENT_OTHER)
Admission: EM | Admit: 2023-09-11 | Discharge: 2023-09-14 | Disposition: A | Discharge status: Home / Self Care | Attending: Internal Medicine | Admitting: Internal Medicine

## 2023-09-11 ENCOUNTER — Other Ambulatory Visit: Payer: Self-pay

## 2023-09-11 ENCOUNTER — Emergency Department (HOSPITAL_BASED_OUTPATIENT_CLINIC_OR_DEPARTMENT_OTHER): Admitting: Radiology

## 2023-09-11 DIAGNOSIS — I200 Unstable angina: Secondary | ICD-10-CM | POA: Insufficient documentation

## 2023-09-11 DIAGNOSIS — I129 Hypertensive chronic kidney disease with stage 1 through stage 4 chronic kidney disease, or unspecified chronic kidney disease: Secondary | ICD-10-CM | POA: Insufficient documentation

## 2023-09-11 DIAGNOSIS — E039 Hypothyroidism, unspecified: Secondary | ICD-10-CM | POA: Insufficient documentation

## 2023-09-11 DIAGNOSIS — Z96643 Presence of artificial hip joint, bilateral: Secondary | ICD-10-CM | POA: Diagnosis not present

## 2023-09-11 DIAGNOSIS — I2510 Atherosclerotic heart disease of native coronary artery without angina pectoris: Secondary | ICD-10-CM | POA: Diagnosis not present

## 2023-09-11 DIAGNOSIS — E785 Hyperlipidemia, unspecified: Secondary | ICD-10-CM | POA: Insufficient documentation

## 2023-09-11 DIAGNOSIS — N183 Chronic kidney disease, stage 3 unspecified: Secondary | ICD-10-CM | POA: Diagnosis not present

## 2023-09-11 DIAGNOSIS — R7989 Other specified abnormal findings of blood chemistry: Secondary | ICD-10-CM | POA: Diagnosis not present

## 2023-09-11 DIAGNOSIS — N281 Cyst of kidney, acquired: Secondary | ICD-10-CM | POA: Diagnosis not present

## 2023-09-11 DIAGNOSIS — I700 Atherosclerosis of aorta: Secondary | ICD-10-CM | POA: Diagnosis not present

## 2023-09-11 DIAGNOSIS — I480 Paroxysmal atrial fibrillation: Secondary | ICD-10-CM | POA: Diagnosis not present

## 2023-09-11 DIAGNOSIS — Z7901 Long term (current) use of anticoagulants: Secondary | ICD-10-CM | POA: Insufficient documentation

## 2023-09-11 DIAGNOSIS — Z79899 Other long term (current) drug therapy: Secondary | ICD-10-CM | POA: Insufficient documentation

## 2023-09-11 DIAGNOSIS — I10 Essential (primary) hypertension: Secondary | ICD-10-CM | POA: Diagnosis present

## 2023-09-11 DIAGNOSIS — R0602 Shortness of breath: Secondary | ICD-10-CM | POA: Diagnosis not present

## 2023-09-11 DIAGNOSIS — G4733 Obstructive sleep apnea (adult) (pediatric): Secondary | ICD-10-CM | POA: Insufficient documentation

## 2023-09-11 DIAGNOSIS — I2699 Other pulmonary embolism without acute cor pulmonale: Secondary | ICD-10-CM | POA: Diagnosis present

## 2023-09-11 DIAGNOSIS — R778 Other specified abnormalities of plasma proteins: Secondary | ICD-10-CM | POA: Diagnosis not present

## 2023-09-11 DIAGNOSIS — Z86711 Personal history of pulmonary embolism: Secondary | ICD-10-CM | POA: Insufficient documentation

## 2023-09-11 LAB — BASIC METABOLIC PANEL WITH GFR
Anion gap: 13 (ref 5–15)
BUN: 17 mg/dL (ref 8–23)
CO2: 17 mmol/L — ABNORMAL LOW (ref 22–32)
Calcium: 9.7 mg/dL (ref 8.9–10.3)
Chloride: 106 mmol/L (ref 98–111)
Creatinine, Ser: 1.5 mg/dL — ABNORMAL HIGH (ref 0.61–1.24)
GFR, Estimated: 53 mL/min — ABNORMAL LOW (ref >60)
Glucose, Bld: 129 mg/dL — ABNORMAL HIGH (ref 70–99)
Potassium: 4 mmol/L (ref 3.5–5.1)
Sodium: 135 mmol/L (ref 135–145)

## 2023-09-11 LAB — CBC
HCT: 47.8 % (ref 39.0–52.0)
Hemoglobin: 15.9 g/dL (ref 13.0–17.0)
MCH: 29.8 pg (ref 26.0–34.0)
MCHC: 33.3 g/dL (ref 30.0–36.0)
MCV: 89.5 fL (ref 80.0–100.0)
Platelets: 164 K/uL (ref 150–400)
RBC: 5.34 MIL/uL (ref 4.22–5.81)
RDW: 18.9 % — ABNORMAL HIGH (ref 11.5–15.5)
WBC: 5 K/uL (ref 4.0–10.5)
nRBC: 0 % (ref 0.0–0.2)

## 2023-09-11 LAB — TROPONIN T, HIGH SENSITIVITY
Troponin T High Sensitivity: 28 ng/L — ABNORMAL HIGH (ref 0–19)
Troponin T High Sensitivity: 26 ng/L — ABNORMAL HIGH (ref 0–19)

## 2023-09-11 MED ORDER — IOHEXOL 350 MG/ML SOLN
100.0000 mL | Freq: Once | INTRAVENOUS | Status: AC | PRN
  Administered 2023-09-11: 100 mL via INTRAVENOUS

## 2023-09-11 MED ORDER — RIVAROXABAN 20 MG PO TABS
20.0000 mg | ORAL_TABLET | Freq: Every day | ORAL | Status: DC
  Filled 2023-09-11: qty 1

## 2023-09-11 MED ORDER — PANTOPRAZOLE SODIUM 40 MG PO TBEC
40.0000 mg | DELAYED_RELEASE_TABLET | Freq: Every day | ORAL | Status: DC
  Administered 2023-09-13 – 2023-09-14 (×2): 40 mg via ORAL
  Filled 2023-09-11 (×3): qty 1

## 2023-09-11 MED ORDER — TADALAFIL 5 MG PO TABS: 5.0000 mg | ORAL_TABLET | Freq: Every day | ORAL | Status: DC

## 2023-09-11 MED ORDER — EZETIMIBE 10 MG PO TABS
10.0000 mg | ORAL_TABLET | Freq: Every day | ORAL | Status: DC
  Filled 2023-09-11: qty 1

## 2023-09-11 MED ORDER — ALPRAZOLAM 0.25 MG PO TABS
0.2500 mg | ORAL_TABLET | Freq: Two times a day (BID) | ORAL | Status: DC | PRN
  Administered 2023-09-12 – 2023-09-14 (×4): 0.25 mg via ORAL
  Filled 2023-09-11 (×4): qty 1

## 2023-09-11 MED ORDER — LISINOPRIL 20 MG PO TABS
20.0000 mg | ORAL_TABLET | Freq: Every day | ORAL | Status: DC
  Filled 2023-09-11: qty 1

## 2023-09-11 MED ORDER — AMLODIPINE BESYLATE 5 MG PO TABS
5.0000 mg | ORAL_TABLET | Freq: Every day | ORAL | Status: DC
  Administered 2023-09-13 – 2023-09-14 (×2): 5 mg via ORAL
  Filled 2023-09-11 (×3): qty 1

## 2023-09-11 MED ORDER — METOPROLOL SUCCINATE ER 25 MG PO TB24: 25.0000 mg | ORAL_TABLET | Freq: Every day | ORAL | Status: DC

## 2023-09-11 MED ORDER — TAMSULOSIN HCL 0.4 MG PO CAPS
0.4000 mg | ORAL_CAPSULE | Freq: Every evening | ORAL | Status: DC
  Administered 2023-09-12 – 2023-09-13 (×2): 0.4 mg via ORAL
  Filled 2023-09-11 (×2): qty 1

## 2023-09-11 NOTE — ED Notes (Signed)
Patient updated on plan of care

## 2023-09-11 NOTE — ED Triage Notes (Signed)
 Pt caox4 c/o SOB x3 days after traveling to [place] . PMH pulmonary emboli, states this SOB feels similar. Denies pain.

## 2023-09-11 NOTE — Progress Notes (Signed)
 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 Patient presented to ED today with concern of shortness of breath at rest and exertion for the last 3 days after recent trip to [place] .  Reports taking Diamox  help high-altitude.  Did not miss Xarelto .  Afebrile, blood pressure elevated 140s, RR on 20s on room air Labs with CBC unremarkable, BUN/creatinine elevated to 17/1.5, troponin elevated to 28 EKG unremarkable for acute findings CT angio chest unremarkable for pulm embolism.  EDP spoke with cardiologist Dr. Santo, recommended inpatient evaluation of angina. Repeat troponin pending. If rising up, may need to discuss with cardiologist for heparin  drip initiation.   Plan of care: Will put in observation order for medical tele at [facility]:  Please note that [facility] will assume the care once patient arrives to the hospital. Prior to that, please reach out to the ED provider on site for any medical need.   Signed, Chapman Rota, MD [facility] 09/11/2023

## 2023-09-11 NOTE — ED Provider Notes (Signed)
 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  Recent trip to [place] .  Patient does report spending some time at altitude, but reports that he was taking oxygen supplementation as well as Diamox  to help with altitude sickness.  He reports no missed doses of his Xarelto .  He denies any chest pain, chest tightness, leg pain, tightness.  Denies any fever, chills.    Shortness of Breath      Prior to Admission medications   Medication Sig Start Date End Date Taking? Authorizing Provider  ALPRAZolam  (XANAX ) 0.25 MG tablet Take 1 tablet by mouth 2 (two) times daily as needed. 04/21/21   [provider]  amLODipine  (NORVASC ) 5 MG tablet Take 1 tablet (5 mg total) by mouth daily. 07/27/23   Emelia Josefa HERO, NP  Evolocumab  (REPATHA  SURECLICK) 140 MG/ML SOAJ Inject 140 mg as directed every 14 (fourteen) days. 07/27/23   Emelia Josefa HERO, NP  lisinopril  (ZESTRIL ) 20 MG tablet Take 1 tablet (20 mg total) by mouth daily. 07/27/23   Emelia Josefa HERO, NP  metoprolol  succinate (TOPROL -XL) 25 MG 24 hr tablet Take 1 tablet (25 mg total) by mouth daily. 07/27/23   Cleaver, Jesse M, NP  omega-3 acid ethyl esters (LOVAZA ) 1 g capsule 2 tab po BID 02/24/10   [provider]  omeprazole  (PRILOSEC) 20 MG capsule Take 1 capsule (20 mg total) by mouth daily. 03/18/14   Baxter Drivers, MD  propafenone  (RYTHMOL  SR) 325 MG 12 hr capsule Take 1 capsule (325 mg total) by mouth 2 (two) times daily. 02/03/23   Swinyer, Rosaline HERO, NP   rivaroxaban  (XARELTO ) 20 MG TABS tablet Take 1 tablet (20 mg total) by mouth daily. 07/27/23   Emelia Josefa HERO, NP  SYNTHROID  150 MCG tablet Take 300 mcg by mouth daily.  07/07/14   [provider]  tadalafil  (CIALIS ) 5 MG tablet Take 5 mg by mouth daily. 04/20/21   [provider]  tamsulosin  (FLOMAX ) 0.4 MG CAPS capsule Take 0.4 mg by mouth every evening.  01/21/19   [provider]    Allergies: Crestor [rosuvastatin calcium] and Lipitor [atorvastatin calcium]    Review of Systems  Respiratory:  Positive for shortness of breath.   All other systems reviewed and are negative.   Updated Vital Signs BP (!) 143/80   Pulse 94   Temp 98.1 F (36.7 C) (Oral)   Resp (!) 22   SpO2 99%   Physical Exam Vitals and nursing note reviewed.  Constitutional:      General: He is not in acute distress.    Appearance: Normal appearance.  HENT:     Head: Normocephalic and atraumatic.  Eyes:     General:        Right eye: No discharge.        Left eye: No discharge.  Cardiovascular:     Rate and Rhythm: Normal rate and regular rhythm.     Heart sounds: No murmur heard.    No friction rub. No gallop.  Comments: Normal sinus rhythm, with occasional missed a beat, consistent with sinus arrhythmia noted on monitor. Pulmonary:     Effort: Pulmonary effort is normal.     Breath sounds: Normal breath sounds.  Abdominal:     General: Bowel sounds are normal.     Palpations: Abdomen is soft.  Skin:    General: Skin is warm and dry.     Capillary Refill: Capillary refill takes less than 2 seconds.  Neurological:     Mental Status: He is alert and oriented to person, place, and time.  Psychiatric:        Mood and Affect: Mood normal.        Behavior: Behavior normal.     (all labs ordered are listed, but only abnormal results are displayed) Labs Reviewed  BASIC METABOLIC PANEL WITH GFR - Abnormal; Notable for the following components:      Result Value   CO2 17  (*)    Glucose, Bld 129 (*)    Creatinine, Ser 1.50 (*)    GFR, Estimated 53 (*)    All other components within normal limits  CBC - Abnormal; Notable for the following components:   RDW 18.9 (*)    All other components within normal limits  TROPONIN T, HIGH SENSITIVITY - Abnormal; Notable for the following components:   Troponin T High Sensitivity 28 (*)    All other components within normal limits  TROPONIN T, HIGH SENSITIVITY    EKG: EKG Interpretation Date/Time:  Monday September 11 2023 14:11:04 EDT Ventricular Rate:  88 PR Interval:  174 QRS Duration:  99 QT Interval:  366 QTC Calculation: 443 R Axis:   47  Text Interpretation: Sinus arrhythmia Borderline T wave abnormalities Baseline wander in lead(s) V3 Confirmed by Jerrol Agent ([id number]) on 09/11/2023 5:02:20 PM  Radiology: CT Angio Chest PE W and/or Wo Contrast Result Date: 09/11/2023 CLINICAL DATA:  Evaluate for pulmonary embolism. EXAM: CT ANGIOGRAPHY CHEST WITH CONTRAST TECHNIQUE: Multidetector CT imaging of the chest was performed using the standard protocol during bolus administration of intravenous contrast. Multiplanar CT image reconstructions and MIPs were obtained to evaluate the vascular anatomy. RADIATION DOSE REDUCTION: This exam was performed according to the departmental dose-optimization program which includes automated exposure control, adjustment of the mA and/or kV according to patient size and/or use of iterative reconstruction technique. CONTRAST:  OMNIPAQUE  IOHEXOL  350 MG/ML SOLN COMPARISON:  Chest radiograph September 11 2023 FINDINGS: Exam is slightly suboptimal due to overlying artifact. Cardiovascular: Satisfactory opacification of the pulmonary arteries to the segmental level. No evidence of pulmonary embolism. Normal heart size. No pericardial effusion. Atherosclerotic calcifications of coronary arteries Mediastinum/Nodes: No enlarged mediastinal, hilar, or axillary lymph nodes. Thyroid  gland, trachea,  and esophagus demonstrate no significant findings. Lungs/Pleura:. No suspicious pulmonary nodule. Scattered calcified micro nodules. No consolidation or architectural distortion. Upper Abdomen: Incidental note was made of retroaortic left renal vein, normal variation. Right kidney cortical cyst which does not require imaging follow-up. Colonic diverticulosis. Musculoskeletal: Degenerative changes of the spine. Review of the MIP images confirms the above findings. IMPRESSION: No suspicious finding to suggest pulmonary embolism. Retroaortic left renal vein, normal variation. Atherosclerotic calcifications of aorta and coronary arteries. Electronically Signed   By: Megan  Zare M.D.   On: 09/11/2023 15:44     Procedures   Medications Ordered in the ED  iohexol  (OMNIPAQUE ) 350 MG/ML injection 100 mL (100 mLs Intravenous Contrast Given 09/11/23 1521)  Medical Decision Making Amount and/or Complexity of Data Reviewed Labs: ordered. Radiology: ordered.  Risk Prescription drug management. Decision regarding hospitalization.   This patient is a 63 y.o. male  who presents to the ED for concern of shob.   Differential diagnoses prior to evaluation: The emergent differential diagnosis includes, but is not limited to,  asthma exacerbation, COPD exacerbation, acute upper respiratory infection, acute bronchitis, chronic bronchitis, interstitial lung disease, ARDS, PE, pneumonia, atypical ACS, carbon monoxide poisoning, spontaneous pneumothorax, new CHF vs CHF exacerbation, versus other . This is not an exhaustive differential.   Past Medical History / Co-morbidities / Social History: hypertension, paroxysmal A-fib, previous history of pulmonary embolism, chronic myalgias since remote Lyme disease in 2014, who is chronically anticoagulated on Xarelto   Additional history: Chart reviewed. Pertinent results include: Reviewed outpatient cardiology visits  Physical  Exam: Physical exam performed. The pertinent findings include: Mild tachypnea on arrival, respirations 22, blood pressure 143/80, vital signs otherwise stable.  No wheezing, rhonchi, stridor, rales.  Normal sinus rhythm, occasional missed beat.  Lab Tests/Imaging studies: I personally interpreted labs/imaging and the pertinent results include: CBC overall unremarkable, BMP notable for mild bicarb deficit, CO2 17, glucose mildly elevated at 129, creatinine mildly elevated 1.5, fairly similar compared to previous baseline..  Neurology, troponin initially elevated at 28, in context of his worsening shortness of breath on exertion, suspicious for worsening angina.  No previous cath, remote stress test.  Will plan to consult cardiology.  I agree with the radiologist interpretation.  Cardiac monitoring: EKG obtained and interpreted by myself and attending physician which shows: Normal sinus rhythm, no acute ST-T changes  Consults: I spoke with the cardiologist, Dr. Santo who agrees with plan for medicine admission for probable anginal presentation, high risk chest pain/shortness of breath, elevated troponin.  Stable for medicine admission.  I talked to the hospitalist, Dr. Arlice, who agrees to admission as discussed above.    Disposition: After consideration of the diagnostic results and the patients response to treatment, I feel that patient would benefit from hospital admission as discussed above.  Final diagnoses:  None    ED Discharge Orders     None          Rosan Sherlean VEAR DEVONNA 09/11/23 1711    Simon Lavonia SAILOR, MD 09/12/23 [phone]

## 2023-09-11 NOTE — ED Notes (Signed)
 Patient took his own supply of nightly medications

## 2023-09-11 NOTE — ED Notes (Signed)
 Lab called and made aware of need to add on Troponin

## 2023-09-12 ENCOUNTER — Encounter (HOSPITAL_COMMUNITY): Payer: Self-pay | Admitting: Internal Medicine

## 2023-09-12 ENCOUNTER — Observation Stay (HOSPITAL_BASED_OUTPATIENT_CLINIC_OR_DEPARTMENT_OTHER)

## 2023-09-12 DIAGNOSIS — I129 Hypertensive chronic kidney disease with stage 1 through stage 4 chronic kidney disease, or unspecified chronic kidney disease: Secondary | ICD-10-CM | POA: Diagnosis not present

## 2023-09-12 DIAGNOSIS — E785 Hyperlipidemia, unspecified: Secondary | ICD-10-CM | POA: Diagnosis not present

## 2023-09-12 DIAGNOSIS — E039 Hypothyroidism, unspecified: Secondary | ICD-10-CM | POA: Diagnosis not present

## 2023-09-12 DIAGNOSIS — R0609 Other forms of dyspnea: Secondary | ICD-10-CM | POA: Diagnosis not present

## 2023-09-12 DIAGNOSIS — I200 Unstable angina: Secondary | ICD-10-CM | POA: Diagnosis not present

## 2023-09-12 DIAGNOSIS — R0602 Shortness of breath: Secondary | ICD-10-CM | POA: Diagnosis not present

## 2023-09-12 DIAGNOSIS — I480 Paroxysmal atrial fibrillation: Secondary | ICD-10-CM | POA: Diagnosis not present

## 2023-09-12 DIAGNOSIS — I10 Essential (primary) hypertension: Secondary | ICD-10-CM | POA: Diagnosis not present

## 2023-09-12 DIAGNOSIS — G4733 Obstructive sleep apnea (adult) (pediatric): Secondary | ICD-10-CM | POA: Diagnosis not present

## 2023-09-12 DIAGNOSIS — R7989 Other specified abnormal findings of blood chemistry: Principal | ICD-10-CM

## 2023-09-12 DIAGNOSIS — N183 Chronic kidney disease, stage 3 unspecified: Secondary | ICD-10-CM | POA: Diagnosis not present

## 2023-09-12 DIAGNOSIS — Z96643 Presence of artificial hip joint, bilateral: Secondary | ICD-10-CM | POA: Diagnosis not present

## 2023-09-12 DIAGNOSIS — Z79899 Other long term (current) drug therapy: Secondary | ICD-10-CM | POA: Diagnosis not present

## 2023-09-12 DIAGNOSIS — Z86711 Personal history of pulmonary embolism: Secondary | ICD-10-CM | POA: Diagnosis not present

## 2023-09-12 DIAGNOSIS — Z7901 Long term (current) use of anticoagulants: Secondary | ICD-10-CM | POA: Diagnosis not present

## 2023-09-12 LAB — CBC WITH DIFFERENTIAL/PLATELET
Abs Immature Granulocytes: 0 K/uL (ref 0.00–0.07)
Basophils Absolute: 0 K/uL (ref 0.0–0.1)
Basophils Relative: 0 %
Eosinophils Absolute: 0 K/uL (ref 0.0–0.5)
Eosinophils Relative: 1 %
HCT: 48.9 % (ref 39.0–52.0)
Hemoglobin: 15.6 g/dL (ref 13.0–17.0)
Immature Granulocytes: 0 %
Lymphocytes Relative: 24 %
Lymphs Abs: 1 K/uL (ref 0.7–4.0)
MCH: 29.2 pg (ref 26.0–34.0)
MCHC: 31.9 g/dL (ref 30.0–36.0)
MCV: 91.4 fL (ref 80.0–100.0)
Monocytes Absolute: 0.3 K/uL (ref 0.1–1.0)
Monocytes Relative: 7 %
Neutro Abs: 2.8 K/uL (ref 1.7–7.7)
Neutrophils Relative %: 68 %
Platelets: 183 K/uL (ref 150–400)
RBC: 5.35 MIL/uL (ref 4.22–5.81)
RDW: 19 % — ABNORMAL HIGH (ref 11.5–15.5)
WBC: 4.1 K/uL (ref 4.0–10.5)
nRBC: 0 % (ref 0.0–0.2)

## 2023-09-12 LAB — BASIC METABOLIC PANEL WITH GFR
Anion gap: 8 (ref 5–15)
BUN: 14 mg/dL (ref 8–23)
CO2: 24 mmol/L (ref 22–32)
Calcium: 9.2 mg/dL (ref 8.9–10.3)
Chloride: 105 mmol/L (ref 98–111)
Creatinine, Ser: 1.56 mg/dL — ABNORMAL HIGH (ref 0.61–1.24)
GFR, Estimated: 50 mL/min — ABNORMAL LOW (ref >60)
Glucose, Bld: 97 mg/dL (ref 70–99)
Potassium: 4.5 mmol/L (ref 3.5–5.1)
Sodium: 137 mmol/L (ref 135–145)

## 2023-09-12 LAB — HEPATIC FUNCTION PANEL
ALT: 76 U/L — ABNORMAL HIGH (ref 0–44)
AST: 37 U/L (ref 15–41)
Albumin: 3 g/dL — ABNORMAL LOW (ref 3.5–5.0)
Alkaline Phosphatase: 58 U/L (ref 38–126)
Bilirubin, Direct: <0.1 mg/dL (ref 0.0–0.2)
Total Bilirubin: 0.7 mg/dL (ref 0.0–1.2)
Total Protein: 6 g/dL — ABNORMAL LOW (ref 6.5–8.1)

## 2023-09-12 LAB — ECHOCARDIOGRAM COMPLETE
Area-P 1/2: 2.73 cm2
Height: 75 in
S' Lateral: 3.4 cm
Single Plane A4C EF: 53 %
Weight: 4083.2 [oz_av]

## 2023-09-12 LAB — TSH: TSH: 1.499 u[IU]/mL (ref 0.350–4.500)

## 2023-09-12 LAB — HIV ANTIBODY (ROUTINE TESTING W REFLEX): HIV Screen 4th Generation wRfx: NONREACTIVE

## 2023-09-12 LAB — TROPONIN I (HIGH SENSITIVITY): Troponin I (High Sensitivity): 18 ng/L — ABNORMAL HIGH (ref <18)

## 2023-09-12 MED ORDER — RIVAROXABAN 20 MG PO TABS: 20.0000 mg | ORAL_TABLET | Freq: Every day | ORAL | Status: DC

## 2023-09-12 MED ORDER — EZETIMIBE 10 MG PO TABS
10.0000 mg | ORAL_TABLET | Freq: Every day | ORAL | Status: DC
Start: 2023-09-12 — End: 2023-09-14
  Administered 2023-09-12 – 2023-09-13 (×2): 10 mg via ORAL
  Filled 2023-09-12 (×2): qty 1

## 2023-09-12 MED ORDER — PROPAFENONE HCL ER 325 MG PO CP12
325.0000 mg | ORAL_CAPSULE | Freq: Two times a day (BID) | ORAL | Status: DC
  Administered 2023-09-13 – 2023-09-14 (×2): 325 mg via ORAL
  Filled 2023-09-12 (×3): qty 1

## 2023-09-12 MED ORDER — PROPAFENONE HCL ER 225 MG PO CP12
225.0000 mg | ORAL_CAPSULE | Freq: Once | ORAL | Status: AC
  Administered 2023-09-12: 225 mg via ORAL
  Filled 2023-09-12: qty 1

## 2023-09-12 MED ORDER — SODIUM CHLORIDE 0.9 % WEIGHT BASED INFUSION
1.0000 mL/kg/h | INTRAVENOUS | Status: DC
  Administered 2023-09-13: 1 mL/kg/h via INTRAVENOUS

## 2023-09-12 MED ORDER — METOPROLOL SUCCINATE ER 25 MG PO TB24
12.5000 mg | ORAL_TABLET | Freq: Every day | ORAL | Status: DC
  Administered 2023-09-13: 12.5 mg via ORAL
  Filled 2023-09-12 (×3): qty 1

## 2023-09-12 MED ORDER — ASPIRIN 81 MG PO CHEW
81.0000 mg | CHEWABLE_TABLET | ORAL | Status: AC
  Administered 2023-09-13: 81 mg via ORAL
  Filled 2023-09-12: qty 1

## 2023-09-12 MED ORDER — LISINOPRIL 20 MG PO TABS
20.0000 mg | ORAL_TABLET | Freq: Every day | ORAL | Status: DC
  Administered 2023-09-12 – 2023-09-13 (×2): 20 mg via ORAL
  Filled 2023-09-12 (×2): qty 1

## 2023-09-12 MED ORDER — SODIUM CHLORIDE 0.9 % WEIGHT BASED INFUSION
3.0000 mL/kg/h | INTRAVENOUS | Status: DC
  Administered 2023-09-13: 3 mL/kg/h via INTRAVENOUS

## 2023-09-12 MED ORDER — ENOXAPARIN SODIUM 40 MG/0.4ML IJ SOSY
40.0000 mg | PREFILLED_SYRINGE | INTRAMUSCULAR | Status: DC
  Administered 2023-09-12 – 2023-09-13 (×2): 40 mg via SUBCUTANEOUS
  Filled 2023-09-12 (×2): qty 0.4

## 2023-09-12 NOTE — Progress Notes (Addendum)
 Pt stated he took his morning meds already this morning. Stated that he checked with cardiology PA, Zane about the meds and was told to take his home meds. Metoprolol  25 mg was discontinued after patient had already taken it. Pt's HR was in mid 50s at that time. Zane made aware. Pt was informed of [facility] policy for home meds. Pt understood and stated his family member could take those meds home for him.  After Zane responded, he told me that he did not tell the patient to take his home meds.  Loyce Blazing, RN

## 2023-09-12 NOTE — Plan of Care (Signed)
  Problem: Education: Goal: Knowledge of General Education information will improve Description: Including pain rating scale, medication(s)/side effects and non-pharmacologic comfort measures Outcome: Progressing   Problem: Clinical Measurements: Goal: Ability to maintain clinical measurements within normal limits will improve Outcome: Progressing Goal: Diagnostic test results will improve Outcome: Progressing Goal: Respiratory complications will improve Outcome: Progressing Goal: Cardiovascular complication will be avoided Outcome: Progressing   Problem: Activity: Goal: Risk for activity intolerance will decrease Outcome: Progressing   Problem: Coping: Goal: Level of anxiety will decrease Outcome: Progressing   Problem: Activity: Goal: Ability to tolerate increased activity will improve Outcome: Progressing   Problem: Elimination: Goal: Will not experience complications related to bowel motility Outcome: Completed/Met Goal: Will not experience complications related to urinary retention Outcome: Completed/Met   Problem: Pain Managment: Goal: General experience of comfort will improve and/or be controlled Outcome: Completed/Met   Problem: Health Behavior/Discharge Planning: Goal: Ability to safely manage health-related needs after discharge will improve Outcome: Completed/Met

## 2023-09-12 NOTE — Consult Note (Addendum)
 Cardiology Consultation   Patient ID: Isaiah [name], Isaiah [name] MRN: [id number]; DOB: May 07, 1960  Admit date: 09/11/2023 Date of Consult: 09/12/2023  PCP:  Nichole Senior, MD   [facility] Providers Cardiologist:  Aleene Passe, MD (Inactive)        Patient Profile: Isaiah [name], Isaiah [name] is a 63 y.o. male with a hx of  obesity, OSA on CPAP, HTN, HLD, prior history of DVT/PE on Xarelto , chronic myalgias since remote Lyme disease 2014, paroxysmal A-fib, hypothyroidism, CHF, CKD, arthritis.  who is being seen 09/12/2023 for the evaluation of elevated troponin at the request of Dr Arlice.  History of Present Illness: Isaiah [name], Isaiah [name] is a 63 y.o. male with a hx of  obesity, OSA on CPAP, HTN, HLD, prior history of DVT/PE on Xarelto , chronic myalgias since remote Lyme disease 2014, paroxysmal A-fib, hypothyroidism, CHF, CKD, arthritis.  who is being seen 09/12/2023 for the evaluation of elevated troponin  Patient presented to the ER with complaints of shortness of breath for 3 days, after traveling to [place] .  Given history CTA chest PE was obtained which is negative for PE, aortic and coronary artery calcifications noted, troponin was checked which is flat 28/26, EKG shows sinus rhythm with PACs, nonspecific ST-T wave changes. Patient reports compliance with Xarelto , no missed doses  Prior echo 08/26/2022 shows LVEF 55 to 60%, no significant valvular problems.  He denies chest pain, reports he was not able to get any air in and was having significant SOB with exertion after his trip to [place] - was not able to walk in the airport. No chest pain on exertion.     Past Medical History:  Diagnosis Date   Arthritis    Blood dyscrasia    lupus anticoagulant   Chronic kidney disease    stage 2   Diastolic dysfunction    Grade I per echo in 2/12   DVT (deep venous thrombosis) (HCC) 2000's   both legs in the calves; left went all the way up to my groin (09/17/2012)   Dysrhythmia     GERD (gastroesophageal reflux disease)    History of bronchitis    Hypercoagulation syndrome (HCC)    secondary to circulating lupus anticoagulant   Hyperlipidemia    Hypertension    Hypothyroidism    wiped out from taking amiodarone (09/17/2012)   Lyme disease    history of    Obesity (BMI 30-39.9) 07/17/2014   PAF (paroxysmal atrial fibrillation) (HCC)    no recurrence since Feb 2012   Pneumonia    PONV (postoperative nausea and vomiting)    likes scopolomanine patch   Pulmonary embolism (HCC) 09/17/2012   just today; 3 on the right; 2 on the left (09/17/2012)   Situational stress    Sleep apnea    uses CPAP    Past Surgical History:  Procedure Laterality Date   CARDIOVASCULAR STRESS TEST  07/17/2009   EF 59%   CARDIOVERSION     DISTAL BICEPS TENDON REPAIR Right 2011   INGUINAL HERNIA REPAIR Right 1992   INGUINAL HERNIA REPAIR Left 2013   INSERTION OF MESH N/A 05/14/2015   Procedure: INSERTION OF MESH;  Surgeon: Krystal Russell, MD;  Location: [facility];  Service: General;  Laterality: N/A;   IR RADIOLOGIST EVAL & MGMT  12/30/2022   SHOULDER ARTHROSCOPY W/ ROTATOR CUFF REPAIR Left 09/04/2012   SHOULDER SURGERY     left   TOTAL HIP ARTHROPLASTY Right 11/26/2014   Procedure: RIGHT TOTAL HIP ARTHROPLASTY ANTERIOR  APPROACH;  Surgeon: Dempsey Moan, MD;  Location: [facility];  Service: Orthopedics;  Laterality: Right;   TOTAL HIP ARTHROPLASTY Left 01/30/2019   Procedure: TOTAL HIP ARTHROPLASTY ANTERIOR APPROACH;  Surgeon: Moan Dempsey, MD;  Location: [facility];  Service: Orthopedics;  Laterality: Left;    TRANSTHORACIC ECHOCARDIOGRAM  03/05/2010   EF 60-65%   UMBILICAL HERNIA REPAIR N/A 05/14/2015   Procedure: HERNIA REPAIR UMBILICAL ADULT WITH MESH ;  Surgeon: Krystal Russell, MD;  Location: [facility];  Service: General;  Laterality: N/A;   WISDOM TOOTH EXTRACTION     lower 2     Home Medications:  Prior to Admission medications   Medication Sig Start Date End Date Taking?  Authorizing Provider  acetaZOLAMIDE  (DIAMOX ) 250 MG tablet Take 250 mg by mouth daily. 08/25/23  Yes [provider]  ALPRAZolam  (XANAX ) 0.25 MG tablet Take 1 tablet by mouth 2 (two) times daily as needed. 04/21/21   [provider]  amLODipine  (NORVASC ) 5 MG tablet Take 1 tablet (5 mg total) by mouth daily. 07/27/23   Emelia Josefa HERO, NP  Evolocumab  (REPATHA  SURECLICK) 140 MG/ML SOAJ Inject 140 mg as directed every 14 (fourteen) days. 07/27/23   Emelia Josefa HERO, NP  ezetimibe  (ZETIA ) 10 MG tablet Take 10 mg by mouth daily.    [provider]  lisinopril  (ZESTRIL ) 20 MG tablet Take 1 tablet (20 mg total) by mouth daily. 07/27/23   Emelia Josefa HERO, NP  metoprolol  succinate (TOPROL -XL) 25 MG 24 hr tablet Take 1 tablet (25 mg total) by mouth daily. 07/27/23   Cleaver, Jesse M, NP  omega-3 acid ethyl esters (LOVAZA ) 1 g capsule 2 tab po BID 02/24/10   [provider]  omeprazole  (PRILOSEC) 20 MG capsule Take 1 capsule (20 mg total) by mouth daily. 03/18/14   Baxter Drivers, MD  propafenone  (RYTHMOL  SR) 325 MG 12 hr capsule Take 1 capsule (325 mg total) by mouth 2 (two) times daily. 02/03/23   Swinyer, Rosaline HERO, NP  rivaroxaban  (XARELTO ) 20 MG TABS tablet Take 1 tablet (20 mg total) by mouth daily. 07/27/23   Emelia Josefa HERO, NP  SYNTHROID  150 MCG tablet Take 300 mcg by mouth daily.  07/07/14   [provider]  tadalafil  (CIALIS ) 5 MG tablet Take 5 mg by mouth daily. 04/20/21   [provider]  tamsulosin  (FLOMAX ) 0.4 MG CAPS capsule Take 0.4 mg by mouth every evening.  01/21/19   [provider]    Scheduled Meds:  amLODipine   5 mg Oral Daily   ezetimibe   10 mg Oral Daily   lisinopril   20 mg Oral Daily   metoprolol  succinate  25 mg Oral Daily   pantoprazole   40 mg Oral Daily   rivaroxaban   20 mg Oral Daily   tadalafil   5 mg Oral Daily   tamsulosin   0.4 mg Oral QPM   Continuous Infusions:  PRN Meds: ALPRAZolam   Allergies:    Allergies   Allergen Reactions   Crestor [Rosuvastatin Calcium] Other (See Comments)    hepatitis   Lipitor [Atorvastatin Calcium] Other (See Comments)    Feels like someone hit him with 2x4    Social History:   Social History   Socioeconomic History   Marital status: Divorced    Spouse name: Not on file   Number of children: Not on file   Years of education: Not on file   Highest education level: Not on file  Occupational History   Not on file  Tobacco  Use   Smoking status: Never   Smokeless tobacco: Never  Vaping Use   Vaping status: Never Used  Substance and Sexual Activity   Alcohol use: Yes    Alcohol/week: 4.0 - 8.0 standard drinks of alcohol    Types: 2 - 4 Cans of beer, 2 - 4 Shots of liquor per week    Comment: 09/17/2012 1-2 beers and 1-2 tequila on Fri and Sat nights   Drug use: No   Sexual activity: Yes  Other Topics Concern   Not on file  Social History Narrative   Not on file   Social Drivers of Health   Financial Resource Strain: Low Risk  (05/11/2021)   Received from [facility]   Overall Financial Resource Strain (CARDIA)    Difficulty of Paying Living Expenses: Not hard at all  Food Insecurity: No Food Insecurity (05/11/2021)   Received from [facility]   Hunger Vital Sign    Within the past 12 months, you worried that your food would run out before you got the money to buy more.: Never true    Within the past 12 months, the food you bought just didn't last and you didn't have money to get more.: Never true  Transportation Needs: Not on file  Physical Activity: Insufficiently Active (05/11/2021)   Received from [facility]   Exercise Vital Sign    On average, how many days per week do you engage in moderate to strenuous exercise (like a brisk walk)?: 3 days    On average, how many minutes do you engage in exercise at this level?: 20 min  Stress: No Stress Concern Present (05/11/2021)   Received from [facility] of Occupational  Health - Occupational Stress Questionnaire    Feeling of Stress : Only a little  Social Connections: Unknown (05/30/2022)   Received from [facility]   Social Network    Social Network: Not on file  Intimate Partner Violence: Unknown (05/30/2022)   Received from [facility]   HITS    Physically Hurt: Not on file    Insult or Talk Down To: Not on file    Threaten Physical Harm: Not on file    Scream or Curse: Not on file    Family History:    Family History  Problem Relation Age of Onset   Hypertension Mother    Hyperlipidemia Mother    Cancer Mother        breast   Hypertension Father    Cancer Brother        prostate   Hypertension Brother    Heart attack Maternal Grandfather      ROS:  Please see the history of present illness.   All other ROS reviewed and negative.     Physical Exam/Data: Vitals:   09/11/23 1745 09/11/23 1800 09/11/23 1900 09/11/23 2240  BP: (!) 146/80  (!) 145/84 135/78  Pulse: (!) 54  (!) 55 73  Resp: 13  16 15   Temp:  98.7 F (37.1 C)  99 F (37.2 C)  TempSrc:  Oral  Oral  SpO2: 98%  98% 98%   No intake or output data in the 24 hours ending 09/12/23 0048    07/27/2023    1:42 PM 08/19/2022    8:45 AM 02/18/2022    8:07 AM  Last 3 Weights  Weight (lbs) 272 lb 9.6 oz 251 lb 9.6 oz 256 lb 12.8 oz  Weight (kg) 123.651 kg 114.125 kg 116.484 kg  There is no height or weight on file to calculate BMI.  General:  Well nourished, well developed, in no acute distress HEENT: normal Neck: no JVD Vascular: No carotid bruits; Distal pulses 2+ bilaterally Cardiac:  normal S1, S2; RRR; no murmur  Lungs:  clear to auscultation bilaterally, no wheezing, rhonchi or rales  Abd: soft, nontender, no hepatomegaly  Ext: no edema Musculoskeletal:  No deformities, BUE and BLE strength normal and equal Skin: warm and dry  Neuro:  CNs 2-12 intact, no focal abnormalities noted Psych:  Normal affect     Laboratory Data: High Sensitivity Troponin:  No  results for input(s): TROPONINIHS in the last 720 hours.   Chemistry Recent Labs  Lab 09/11/23 1403  NA 135  K 4.0  CL 106  CO2 17*  GLUCOSE 129*  BUN 17  CREATININE 1.50*  CALCIUM 9.7  GFRNONAA 53*  ANIONGAP 13    No results for input(s): PROT, ALBUMIN, AST, ALT, ALKPHOS, BILITOT in the last 168 hours. Lipids No results for input(s): CHOL, TRIG, HDL, LABVLDL, LDLCALC, CHOLHDL in the last 168 hours.  Hematology Recent Labs  Lab 09/11/23 1403  WBC 5.0  RBC 5.34  HGB 15.9  HCT 47.8  MCV 89.5  MCH 29.8  MCHC 33.3  RDW 18.9*  PLT 164   Thyroid  No results for input(s): TSH, FREET4 in the last 168 hours.  BNPNo results for input(s): BNP, PROBNP in the last 168 hours.  DDimer No results for input(s): DDIMER in the last 168 hours.  Radiology/Studies:  CT Angio Chest PE W and/or Wo Contrast Result Date: 09/11/2023 CLINICAL DATA:  Evaluate for pulmonary embolism. EXAM: CT ANGIOGRAPHY CHEST WITH CONTRAST TECHNIQUE: Multidetector CT imaging of the chest was performed using the standard protocol during bolus administration of intravenous contrast. Multiplanar CT image reconstructions and MIPs were obtained to evaluate the vascular anatomy. RADIATION DOSE REDUCTION: This exam was performed according to the departmental dose-optimization program which includes automated exposure control, adjustment of the mA and/or kV according to patient size and/or use of iterative reconstruction technique. CONTRAST:  OMNIPAQUE  IOHEXOL  350 MG/ML SOLN COMPARISON:  Chest radiograph September 11 2023 FINDINGS: Exam is slightly suboptimal due to overlying artifact. Cardiovascular: Satisfactory opacification of the pulmonary arteries to the segmental level. No evidence of pulmonary embolism. Normal heart size. No pericardial effusion. Atherosclerotic calcifications of coronary arteries Mediastinum/Nodes: No enlarged mediastinal, hilar, or axillary lymph nodes. Thyroid  gland,  trachea, and esophagus demonstrate no significant findings. Lungs/Pleura:. No suspicious pulmonary nodule. Scattered calcified micro nodules. No consolidation or architectural distortion. Upper Abdomen: Incidental note was made of retroaortic left renal vein, normal variation. Right kidney cortical cyst which does not require imaging follow-up. Colonic diverticulosis. Musculoskeletal: Degenerative changes of the spine. Review of the MIP images confirms the above findings. IMPRESSION: No suspicious finding to suggest pulmonary embolism. Retroaortic left renal vein, normal variation. Atherosclerotic calcifications of aorta and coronary arteries. Electronically Signed   By: Megan  Zare M.D.   On: 09/11/2023 15:44     Assessment and Plan: Elevated troponin but flat/suggestive of demand ischemia but unclear etiology of why he would have troponin, no chest pain History of DVT/PE on Xarelto . H/o lupus anticoagulatn Paroxysmal A-fib.-> in NSR Multiple other comorbidities including hypertension, hyperlipidemia, OSA, remote history of Lyme's disease, hypothyroidism, CKD  Plan Continue to monitor cardiac enzymes, obtain echocardiogram AM. Probably consider CCTA in the morning to rule out any obstructive CAD (pending Cr/GFR), vs PET Stress. I will keep him NPO for now  continue home medications, no need for heparin , no chest pain currently.   CT PE is negative but does have coronary artery calcification. - continue xarelto  and other home meds. Euvolemic on eam  Will follw   Risk Assessment/Risk Scores:         CHA2DS2-VASc Score = 3   This indicates a 3.2% annual risk of stroke. The patient's score is based upon: CHF History: 1 HTN History: 1 Diabetes History: 0 Stroke History: 0 Vascular Disease History: 1 Age Score: 0 Gender Score: 0        For questions or updates, please contact [facility] Please consult [email or web address] for contact info under    Signed, Grayce Bold,  MD  09/12/2023 12:48 AM

## 2023-09-12 NOTE — Progress Notes (Signed)
  Echocardiogram 2D Echocardiogram has been performed.  Isaiah [name] 09/12/2023, 3:33 PM

## 2023-09-12 NOTE — Care Plan (Signed)
 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 in the mountains but despite coming to ground-level patient still feels short of breath, He presents to the ER.  Denies chest pain,  productive cough,  fever or chills.  Has not missed his dose of Xarelto .  Patient's shortness of breath was present even at rest and increases with exertion. CT angiogram of the chest was negative for PE but did show coronary calcifications.  Troponins were 28 and 26 but EKG shows normal sinus rhythm.  PVCs.  Cardiology was consulted,  Patient was admitted for further observation.  Labs showed creatinine of 1.5 hemoglobin 15.9.  Cardiologist evaluated the patient and recommended to continue coronary CT.  Patient reports feeling better,  still has some shortness of breath.

## 2023-09-12 NOTE — Progress Notes (Addendum)
    Informed Consent   Shared Decision Making/Informed Consent The risks, including but not limited to, [bleeding or vascular complications (1 in 500), pneumothorax (1 in 1600), arrhythmia (1 in 1000) and death (1 in 5000)], benefits (diagnostic support and/or management of heart failure, pulmonary hypertension) and alternatives of a right heart catheterization were discussed in detail with Mr. Lough and he is willing to proceed. The risks [stroke (1 in 1000), death (1 in 1000), kidney failure [usually temporary] (1 in 500), bleeding (1 in 200), allergic reaction [possibly serious] (1 in 200)], benefits (diagnostic support and management of coronary artery disease) and alternatives of a cardiac catheterization were discussed in detail with Mr. Spellman and he is willing to proceed.      Patient is trying to increase oral intake to improve renal function prior to cardiac catheterization.  Signed,  Morse Clause, PA-C 09/12/2023, 5:20 PM

## 2023-09-12 NOTE — H&P (Signed)
 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  Chief Complaint: Shortness of breath.  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  Denies chest pain productive cough fever or chills.  Has not missed his dose of Xarelto .  Patient's shortness of breath was present even at rest and increases with exertion.  ED Course: In the ER CT angiogram of the chest was negative for PE did show coronary calcifications.  Troponins were 28 and 26 but EKG shows normal sinus rhythm.  PVCs.  Cardiology was consulted patient admitted for further observation.  Labs showed creatinine of 1.5 hemoglobin 15.9.  Review of Systems: As per HPI, rest all negative.   Past Medical History:  Diagnosis Date   Arthritis    Blood dyscrasia    lupus anticoagulant   Chronic kidney disease    stage 2   Diastolic dysfunction    Grade I per echo in 2/12   DVT (deep venous thrombosis) (HCC) 2000's   both legs in the calves; left went all the way up to my groin (09/17/2012)   Dysrhythmia    GERD (gastroesophageal reflux disease)    History of bronchitis    Hypercoagulation syndrome (HCC)    secondary to circulating lupus anticoagulant   Hyperlipidemia    Hypertension    Hypothyroidism    wiped out from taking amiodarone (09/17/2012)   Lyme disease    history of    Obesity (BMI 30-39.9) 07/17/2014   PAF (paroxysmal atrial fibrillation) (HCC)    no recurrence since Feb 2012   Pneumonia    PONV  (postoperative nausea and vomiting)    likes scopolomanine patch   Pulmonary embolism (HCC) 09/17/2012   just today; 3 on the right; 2 on the left (09/17/2012)   Situational stress    Sleep apnea    uses CPAP    Past Surgical History:  Procedure Laterality Date   CARDIOVASCULAR STRESS TEST  07/17/2009   EF 59%   CARDIOVERSION     DISTAL BICEPS TENDON REPAIR Right 2011   INGUINAL HERNIA REPAIR Right 1992   INGUINAL HERNIA REPAIR Left 2013   INSERTION OF MESH N/A 05/14/2015   Procedure: INSERTION OF MESH;  Surgeon: Krystal Russell, MD;  Location: [facility];  Service: General;  Laterality: N/A;   IR RADIOLOGIST EVAL & MGMT  12/30/2022   SHOULDER ARTHROSCOPY W/ ROTATOR CUFF REPAIR Left 09/04/2012   SHOULDER SURGERY     left   TOTAL HIP ARTHROPLASTY Right 11/26/2014   Procedure: RIGHT TOTAL HIP ARTHROPLASTY ANTERIOR APPROACH;  Surgeon: Dempsey Moan, MD;  Location: [facility];  Service: Orthopedics;  Laterality: Right;   TOTAL HIP ARTHROPLASTY Left 01/30/2019   Procedure: TOTAL HIP ARTHROPLASTY ANTERIOR APPROACH;  Surgeon: Moan Dempsey, MD;  Location: [facility];  Service: Orthopedics;  Laterality: Left;    TRANSTHORACIC ECHOCARDIOGRAM  03/05/2010   EF 60-65%   UMBILICAL HERNIA REPAIR N/A 05/14/2015   Procedure: HERNIA REPAIR UMBILICAL ADULT WITH MESH ;  Surgeon: Krystal Russell,  MD;  Location: [facility];  Service: General;  Laterality: N/A;   WISDOM TOOTH EXTRACTION     lower 2     reports that he has never smoked. He has never used smokeless tobacco. He reports current alcohol use of about 4.0 - 8.0 standard drinks of alcohol per week. He reports that he does not use drugs.  Allergies  Allergen Reactions   Crestor [Rosuvastatin Calcium] Other (See Comments)    hepatitis   Lipitor [Atorvastatin Calcium] Other (See Comments)    Feels like someone hit him with 2x4    Family History  Problem Relation Age of Onset   Hypertension Mother    Hyperlipidemia Mother    Cancer Mother         breast   Hypertension Father    Cancer Brother        prostate   Hypertension Brother    Heart attack Maternal Grandfather     Prior to Admission medications   Medication Sig Start Date End Date Taking? Authorizing Provider  acetaZOLAMIDE  (DIAMOX ) 250 MG tablet Take 250 mg by mouth daily. 08/25/23  Yes [provider]  ALPRAZolam  (XANAX ) 0.25 MG tablet Take 1 tablet by mouth 2 (two) times daily as needed. 04/21/21   [provider]  amLODipine  (NORVASC ) 5 MG tablet Take 1 tablet (5 mg total) by mouth daily. 07/27/23   Emelia Josefa HERO, NP  Evolocumab  (REPATHA  SURECLICK) 140 MG/ML SOAJ Inject 140 mg as directed every 14 (fourteen) days. 07/27/23   Emelia Josefa HERO, NP  ezetimibe  (ZETIA ) 10 MG tablet Take 10 mg by mouth daily.    [provider]  lisinopril  (ZESTRIL ) 20 MG tablet Take 1 tablet (20 mg total) by mouth daily. 07/27/23   Emelia Josefa HERO, NP  metoprolol  succinate (TOPROL -XL) 25 MG 24 hr tablet Take 1 tablet (25 mg total) by mouth daily. 07/27/23   Cleaver, Jesse M, NP  omega-3 acid ethyl esters (LOVAZA ) 1 g capsule 2 tab po BID 02/24/10   [provider]  omeprazole  (PRILOSEC) 20 MG capsule Take 1 capsule (20 mg total) by mouth daily. 03/18/14   Baxter Drivers, MD  propafenone  (RYTHMOL  SR) 325 MG 12 hr capsule Take 1 capsule (325 mg total) by mouth 2 (two) times daily. 02/03/23   Swinyer, Rosaline HERO, NP  rivaroxaban  (XARELTO ) 20 MG TABS tablet Take 1 tablet (20 mg total) by mouth daily. 07/27/23   Emelia Josefa HERO, NP  SYNTHROID  150 MCG tablet Take 300 mcg by mouth daily.  07/07/14   [provider]  tadalafil  (CIALIS ) 5 MG tablet Take 5 mg by mouth daily. 04/20/21   [provider]  tamsulosin  (FLOMAX ) 0.4 MG CAPS capsule Take 0.4 mg by mouth every evening.  01/21/19   [provider]    Physical Exam: Constitutional: Moderately built and nourished. Vitals:   09/11/23 1800 09/11/23 1900 09/11/23 2240 09/11/23 2345  BP:  (!) 145/84  135/78   Pulse:  (!) 55 73   Resp:  16 15 14   Temp: 98.7 F (37.1 C)  99 F (37.2 C) 98.3 F (36.8 C)  TempSrc: Oral  Oral Oral  SpO2:  98% 98% 96%  Weight:    115.8 kg  Height:    6' 3 (1.905 m)   Eyes: Anicteric no pallor. ENMT: No discharge from the ears eyes nose or mouth. Neck: No mass felt.  No neck rigidity. Respiratory: No rhonchi or crepitations. Cardiovascular: S1-S2 heard. Abdomen: Soft nontender bowel sound present.  Musculoskeletal: No edema. Skin: No rash. Neurologic: Alert awake oriented time place and person.  Moves all extremities. Psychiatric: Appears normal.  Normal affect.   Labs on Admission: I have personally reviewed following labs and imaging studies  CBC: Recent Labs  Lab 09/11/23 1403  WBC 5.0  HGB 15.9  HCT 47.8  MCV 89.5  PLT 164   Basic Metabolic Panel: Recent Labs  Lab 09/11/23 1403  NA 135  K 4.0  CL 106  CO2 17*  GLUCOSE 129*  BUN 17  CREATININE 1.50*  CALCIUM 9.7   GFR: Estimated Creatinine Clearance: 71 mL/min (A) (by C-G formula based on SCr of 1.5 mg/dL (H)). Liver Function Tests: No results for input(s): AST, ALT, ALKPHOS, BILITOT, PROT, ALBUMIN in the last 168 hours. No results for input(s): LIPASE, AMYLASE in the last 168 hours. No results for input(s): AMMONIA in the last 168 hours. Coagulation Profile: No results for input(s): INR, PROTIME in the last 168 hours. Cardiac Enzymes: No results for input(s): CKTOTAL, CKMB, CKMBINDEX, TROPONINI in the last 168 hours. BNP (last 3 results) No results for input(s): PROBNP in the last 8760 hours. HbA1C: No results for input(s): HGBA1C in the last 72 hours. CBG: No results for input(s): GLUCAP in the last 168 hours. Lipid Profile: No results for input(s): CHOL, HDL, LDLCALC, TRIG, CHOLHDL, LDLDIRECT in the last 72 hours. Thyroid  Function Tests: No results for input(s): TSH, T4TOTAL, FREET4, T3FREE, THYROIDAB in  the last 72 hours. Anemia Panel: No results for input(s): VITAMINB12, FOLATE, FERRITIN, TIBC, IRON, RETICCTPCT in the last 72 hours. Urine analysis:    Component Value Date/Time   COLORURINE YELLOW 05/06/2020 2116   APPEARANCEUR CLEAR 05/06/2020 2116   LABSPEC 1.025 05/06/2020 2116   PHURINE 6.0 05/06/2020 2116   GLUCOSEU NEGATIVE 05/06/2020 2116   HGBUR NEGATIVE 05/06/2020 2116   BILIRUBINUR NEGATIVE 05/06/2020 2116   KETONESUR NEGATIVE 05/06/2020 2116   PROTEINUR NEGATIVE 05/06/2020 2116   UROBILINOGEN 0.2 11/21/2014 1435   NITRITE NEGATIVE 05/06/2020 2116   LEUKOCYTESUR NEGATIVE 05/06/2020 2116   Sepsis Labs: @LABRCNTIP (procalcitonin:4,lacticidven:4) )No results found for this or any previous visit (from the past 240 hours).   Radiological Exams on Admission: CT Angio Chest PE W and/or Wo Contrast Result Date: 09/11/2023 CLINICAL DATA:  Evaluate for pulmonary embolism. EXAM: CT ANGIOGRAPHY CHEST WITH CONTRAST TECHNIQUE: Multidetector CT imaging of the chest was performed using the standard protocol during bolus administration of intravenous contrast. Multiplanar CT image reconstructions and MIPs were obtained to evaluate the vascular anatomy. RADIATION DOSE REDUCTION: This exam was performed according to the departmental dose-optimization program which includes automated exposure control, adjustment of the mA and/or kV according to patient size and/or use of iterative reconstruction technique. CONTRAST:  OMNIPAQUE  IOHEXOL  350 MG/ML SOLN COMPARISON:  Chest radiograph September 11 2023 FINDINGS: Exam is slightly suboptimal due to overlying artifact. Cardiovascular: Satisfactory opacification of the pulmonary arteries to the segmental level. No evidence of pulmonary embolism. Normal heart size. No pericardial effusion. Atherosclerotic calcifications of coronary arteries Mediastinum/Nodes: No enlarged mediastinal, hilar, or axillary lymph nodes. Thyroid  gland, trachea, and  esophagus demonstrate no significant findings. Lungs/Pleura:. No suspicious pulmonary nodule. Scattered calcified micro nodules. No consolidation or architectural distortion. Upper Abdomen: Incidental note was made of retroaortic left renal vein, normal variation. Right kidney cortical cyst which does not require imaging follow-up. Colonic diverticulosis. Musculoskeletal: Degenerative changes of the spine. Review of the MIP images confirms the above findings. IMPRESSION: No suspicious finding to suggest pulmonary embolism. Retroaortic left renal  vein, normal variation. Atherosclerotic calcifications of aorta and coronary arteries. Electronically Signed   By: Megan  Zare M.D.   On: 09/11/2023 15:44    EKG: Independently reviewed.  Normal sinus rhythm.  PVCs.  Assessment/Plan Principal Problem:   Elevated troponin Active Problems:   HTN (hypertension)   PAF (paroxysmal atrial fibrillation) (HCC)   Hypothyroidism- followed by Dr. Garnette Ore of Endo   h/o Pulmonary embolism and infarction (HCC)   Dyslipidemia   OSA (obstructive sleep apnea)   Chronic kidney disease (CKD), stage III (moderate) (HCC)   Unstable angina (HCC)    Elevated troponin with dyspnea -    concerning for unstable angina.  Appreciate cardiology consult.  Cardiology planning to do CCTA.  Will continue with present home medications as recommended by cardiologist. Hypertension on lisinopril  amlodipine  and metoprolol  History of recurrent PE and DVT on Xarelto . Chronic kidney disease stage III creatinine at around baseline. Sleep apnea on CPAP at bedtime. Hyperlipidemia on Zetia . Hypothyroidism on Synthroid . Paroxysmal atrial fibrillation presently rate controlled in sinus rhythm.  Takes Xarelto . GERD on PPI.  Since patient has exertional dyspnea will need further workup given the elevated troponin and will need more than 2 midnight stay.   DVT prophylaxis: Xarelto . Code Status: Full code. Family Communication:  Observation. Disposition Plan: Cardiac telemetry. Consults called: Cardiology. Admission status: Observation.

## 2023-09-12 NOTE — Progress Notes (Signed)
 Pt. Has his own personal cpap from home. Operates himself.

## 2023-09-12 NOTE — Progress Notes (Addendum)
 Progress Note  Patient Name: Isaiah [name], [name] Date of Encounter: 09/12/2023  [facility] Cardiologist: Aleene Passe, MD (Inactive)   Interval Summary   Had worsening shortness of breath that started while at a trip to [place] .  Continued to have shortness of breath after returning home.  Denies chest pain, fever, chills, diaphoresis, lower extremity edema, nausea, and vomiting.  Drinks about 2 alcoholic drinks per day.  Denies nicotine use or illicit substance use.  Vital Signs Vitals:   09/11/23 1800 09/11/23 1900 09/11/23 2240 09/11/23 2345  BP:  (!) 145/84 135/78   Pulse:  (!) 55 73   Resp:  16 15 14   Temp: 98.7 F (37.1 C)  99 F (37.2 C) 98.3 F (36.8 C)  TempSrc: Oral  Oral Oral  SpO2:  98% 98% 96%  Weight:    115.8 kg  Height:    6' 3 (1.905 m)   No intake or output data in the 24 hours ending 09/12/23 0716    09/11/2023   11:45 PM 07/27/2023    1:42 PM 08/19/2022    8:45 AM  Last 3 Weights  Weight (lbs) 255 lb 3.2 oz 272 lb 9.6 oz 251 lb 9.6 oz  Weight (kg) 115.758 kg 123.651 kg 114.125 kg      Telemetry/ECG  Normal sinus rhythm with heart rates in the 50s to 60s.  Has sinus arrhythmia every 2-4 beats- Personally Reviewed  Physical Exam  GEN: No acute distress.  Comfortable lying flat on bed.  Alert and orientated, on room air.  Neck: No JVD Cardiac: regularly irregular rhythm., no murmurs, rubs, or gallops.  Respiratory: Clear to auscultation bilaterally. 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  who is being seen 09/12/2023 for the evaluation of shortness of breath and elevated troponin at the request of Dr Arlice.   Shortness of breath Dyspnea on  exertion Presented to the emergency department complaining of worsening shortness of breath on exertion. Denies any chest pain. Prior echocardiogram on 08/2022 showed an LVEF of 55 to 60%, no RWMA, mild MR, and aortic root dilation measuring 42 mm. Order echocardiogram Originally planned for coronary CT but will hold off ordering due to an AKI with a creatinine of 1.56. Will trend creatinine.   Bradycardia Sinus arrhythmia On telemetry has sinus arrhythmia every 2-4 beats.  I am unable to see any mention of this in the patient's chart or the pattern on prior EKGs.   Decrease metoprolol  12.5 mg daily due to bradycardia   Paroxysmal atrial fibrillation CHA2DS2-VASc Score = 3 [CHF History: 1, HTN History: 1, Diabetes History: 0, Stroke History: 0, Vascular Disease History: 1, Age Score: 0, Gender Score: 0].  Therefore, the patient's annual risk of stroke is 3.2 %.    PTA on Xarelto . Also has a history of DVT/PE with abnormal lupus anticoagulant. Pulmonary CTA negative for a PE. In sinus rhythm.  Patient on propafenone  at home. Continue Xarelto  20mg  daily. Creatinine clearance 84.   Elevated troponin 28 >26 Most consistent with demand ischemia   Coronary calcifications Seen on pulmonary CTA.   Hyperlipidemia Plan to continue Repatha  after patient returns home Continue Zetia  10 mg daily.   Otherwise managed per primary  For questions or updates, please contact [facility] Please consult [email or web address] for contact info under       Signed, Morse Clause, PA-C   Patient seen and examined, note reviewed with the signed Advanced Practice Provider. I personally reviewed laboratory data, imaging studies and relevant notes. I independently examined the patient and formulated the important aspects of the plan. I have personally discussed the plan with the patient and/or family. Comments or changes to the note/plan are indicated below.  Paroxysmal atrial fibrillation Sinus  arrhythmia Bradycardia Coronary artery disease  Will go ahead and cut back on his metoprolol  to 12.5 mg to see if this is going to help.  In the meantime he still remains short of breath.  Will get an echocardiogram to assess for any structural abnormalities and LV dysfunction.  In 2018 his coronary CTA showed 30% LAD stenosis with his persistent shortness of breath he was ruled out for pulmonary embolism.  If his echo is normal and this persists I will proceed with a right and left heart catheterization in this patient if his kidney function does not worsen.   Darral Rishel DO, MS [facility] Attending Cardiologist [facility]  [address] [place], [place] [address] [phone] Website: [email or web address]

## 2023-09-12 NOTE — Plan of Care (Signed)
  Problem: Education: Goal: Knowledge of General Education information will improve Description: Including pain rating scale, medication(s)/side effects and non-pharmacologic comfort measures Outcome: Progressing   Problem: Clinical Measurements: Goal: Respiratory complications will improve Outcome: Progressing   Problem: Coping: Goal: Level of anxiety will decrease Outcome: Progressing   Problem: Pain Managment: Goal: General experience of comfort will improve and/or be controlled Outcome: Progressing   Problem: Health Behavior/Discharge Planning: Goal: Ability to manage health-related needs will improve Outcome: Completed/Met   Problem: Nutrition: Goal: Adequate nutrition will be maintained Outcome: Completed/Met

## 2023-09-13 DIAGNOSIS — R7989 Other specified abnormal findings of blood chemistry: Secondary | ICD-10-CM | POA: Diagnosis not present

## 2023-09-13 DIAGNOSIS — I200 Unstable angina: Secondary | ICD-10-CM

## 2023-09-13 DIAGNOSIS — R0600 Dyspnea, unspecified: Secondary | ICD-10-CM | POA: Diagnosis not present

## 2023-09-13 DIAGNOSIS — E785 Hyperlipidemia, unspecified: Secondary | ICD-10-CM | POA: Diagnosis not present

## 2023-09-13 DIAGNOSIS — I10 Essential (primary) hypertension: Secondary | ICD-10-CM | POA: Diagnosis not present

## 2023-09-13 HISTORY — PX: RIGHT/LEFT HEART CATH AND CORONARY ANGIOGRAPHY: CATH118266

## 2023-09-13 LAB — POCT I-STAT 7, (LYTES, BLD GAS, ICA,H+H)
Acid-base deficit: 3 mmol/L — ABNORMAL HIGH (ref 0.0–2.0)
Bicarbonate: 21.6 mmol/L (ref 20.0–28.0)
Calcium, Ion: 1.21 mmol/L (ref 1.15–1.40)
HCT: 45 % (ref 39.0–52.0)
Hemoglobin: 15.3 g/dL (ref 13.0–17.0)
O2 Saturation: 95 %
Potassium: 3.9 mmol/L (ref 3.5–5.1)
Sodium: 140 mmol/L (ref 135–145)
TCO2: 23 mmol/L (ref 22–32)
pCO2 arterial: 35.1 mmHg (ref 32–48)
pH, Arterial: 7.397 (ref 7.35–7.45)
pO2, Arterial: 78 mmHg — ABNORMAL LOW (ref 83–108)

## 2023-09-13 LAB — POCT I-STAT EG7
Acid-base deficit: 2 mmol/L (ref 0.0–2.0)
Bicarbonate: 23 mmol/L (ref 20.0–28.0)
Calcium, Ion: 1.23 mmol/L (ref 1.15–1.40)
HCT: 46 % (ref 39.0–52.0)
Hemoglobin: 15.6 g/dL (ref 13.0–17.0)
O2 Saturation: 77 %
Potassium: 4 mmol/L (ref 3.5–5.1)
Sodium: 139 mmol/L (ref 135–145)
TCO2: 24 mmol/L (ref 22–32)
pCO2, Ven: 37.7 mmHg — ABNORMAL LOW (ref 44–60)
pH, Ven: 7.393 (ref 7.25–7.43)
pO2, Ven: 42 mmHg (ref 32–45)

## 2023-09-13 LAB — BASIC METABOLIC PANEL WITH GFR
Anion gap: 5 (ref 5–15)
BUN: 15 mg/dL (ref 8–23)
CO2: 25 mmol/L (ref 22–32)
Calcium: 9.1 mg/dL (ref 8.9–10.3)
Chloride: 106 mmol/L (ref 98–111)
Creatinine, Ser: 1.56 mg/dL — ABNORMAL HIGH (ref 0.61–1.24)
GFR, Estimated: 50 mL/min — ABNORMAL LOW (ref >60)
Glucose, Bld: 100 mg/dL — ABNORMAL HIGH (ref 70–99)
Potassium: 4.8 mmol/L (ref 3.5–5.1)
Sodium: 136 mmol/L (ref 135–145)

## 2023-09-13 SURGERY — RIGHT/LEFT HEART CATH AND CORONARY ANGIOGRAPHY
Anesthesia: LOCAL

## 2023-09-13 MED ORDER — MIDAZOLAM HCL 2 MG/2ML IJ SOLN
INTRAMUSCULAR | Status: AC
  Filled 2023-09-13: qty 2

## 2023-09-13 MED ORDER — LIDOCAINE HCL (PF) 1 % IJ SOLN
INTRAMUSCULAR | Status: AC
  Filled 2023-09-13: qty 30

## 2023-09-13 MED ORDER — FREE WATER
500.0000 mL | Freq: Once | Status: AC
  Administered 2023-09-13: 500 mL via ORAL

## 2023-09-13 MED ORDER — HEPARIN SODIUM (PORCINE) 1000 UNIT/ML IJ SOLN
INTRAMUSCULAR | Status: DC | PRN
  Administered 2023-09-13: 5000 [IU] via INTRAVENOUS

## 2023-09-13 MED ORDER — LIDOCAINE HCL (PF) 1 % IJ SOLN
INTRAMUSCULAR | Status: DC | PRN
  Administered 2023-09-13: 5 mL

## 2023-09-13 MED ORDER — FENTANYL CITRATE (PF) 100 MCG/2ML IJ SOLN
INTRAMUSCULAR | Status: DC | PRN
  Administered 2023-09-13 (×2): 25 ug via INTRAVENOUS

## 2023-09-13 MED ORDER — IOHEXOL 350 MG/ML SOLN
INTRAVENOUS | Status: DC | PRN
  Administered 2023-09-13: 15 mL

## 2023-09-13 MED ORDER — SODIUM CHLORIDE 0.9% FLUSH
3.0000 mL | Freq: Two times a day (BID) | INTRAVENOUS | Status: DC
  Administered 2023-09-13 – 2023-09-14 (×2): 3 mL via INTRAVENOUS

## 2023-09-13 MED ORDER — HEPARIN (PORCINE) IN NACL 1000-0.9 UT/500ML-% IV SOLN
INTRAVENOUS | Status: DC | PRN
  Administered 2023-09-13: 1000 mL via SURGICAL_CAVITY

## 2023-09-13 MED ORDER — SODIUM CHLORIDE 0.9% FLUSH: 3.0000 mL | INTRAVENOUS | Status: DC | PRN

## 2023-09-13 MED ORDER — VERAPAMIL HCL 2.5 MG/ML IV SOLN
INTRAVENOUS | Status: DC | PRN
  Administered 2023-09-13: 10 mL via INTRA_ARTERIAL

## 2023-09-13 MED ORDER — SODIUM CHLORIDE 0.9 % IV SOLN: 250.0000 mL | INTRAVENOUS | Status: DC | PRN

## 2023-09-13 MED ORDER — MIDAZOLAM HCL 2 MG/2ML IJ SOLN
INTRAMUSCULAR | Status: DC | PRN
Start: 2023-09-13 — End: 2023-09-13
  Administered 2023-09-13 (×2): 1 mg via INTRAVENOUS

## 2023-09-13 MED ORDER — FENTANYL CITRATE (PF) 100 MCG/2ML IJ SOLN
INTRAMUSCULAR | Status: AC
  Filled 2023-09-13: qty 2

## 2023-09-13 SURGICAL SUPPLY — 8 items
CATH BALLN WEDGE 5F 110CM (CATHETERS) IMPLANT
CATH INFINITI AMBI 5FR JK (CATHETERS) IMPLANT
DEVICE RAD COMP TR BAND LRG (VASCULAR PRODUCTS) IMPLANT
GLIDESHEATH SLEND SS 6F .021 (SHEATH) IMPLANT
GUIDEWIRE INQWIRE 1.5J.035X260 (WIRE) IMPLANT
PACK CARDIAC CATHETERIZATION (CUSTOM PROCEDURE TRAY) ×1 IMPLANT
SET ATX-X65L (MISCELLANEOUS) IMPLANT
SHEATH GLIDE SLENDER 4/5FR (SHEATH) IMPLANT

## 2023-09-13 NOTE — Progress Notes (Signed)
   09/13/23 2054  BiPAP/CPAP/SIPAP  BiPAP/CPAP/SIPAP Pt Type Adult  BiPAP/CPAP/SIPAP Resmed (Pt places himself on home unit. RT will assist as needed.)  FiO2 (%) 21 %  Patient Home Machine Yes  Safety Check Completed by RT for Home Unit Yes, no issues noted  Patient Home Mask Yes  Patient Home Tubing Yes

## 2023-09-13 NOTE — Progress Notes (Signed)
 Mobility Specialist Progress Note;   09/13/23 1145  Mobility  Activity Ambulated independently  Level of Assistance Independent after set-up  Assistive Device Other (Comment) (IV pole)  Distance Ambulated (ft) 400 ft  Activity Response Tolerated well  Mobility Referral Yes  Mobility visit 1 Mobility  Mobility Specialist Start Time (ACUTE ONLY) 1145  Mobility Specialist Stop Time (ACUTE ONLY) 1152  Mobility Specialist Time Calculation (min) (ACUTE ONLY) 7 min   Pt agreeable to mobility. Required no physical assistance during ambulation. VSS throughout. No c/o SOB during ambulation. Pt returned to room and left in BR with all needs met.  Lauraine Erm Mobility Specialist Please contact via SecureChat or [employer] [phone]

## 2023-09-13 NOTE — Plan of Care (Signed)
  Problem: Education: Goal: Knowledge of General Education information will improve Description: Including pain rating scale, medication(s)/side effects and non-pharmacologic comfort measures Outcome: Progressing   Problem: Clinical Measurements: Goal: Ability to maintain clinical measurements within normal limits will improve Outcome: Progressing Goal: Will remain free from infection Outcome: Progressing Goal: Diagnostic test results will improve Outcome: Progressing Goal: Respiratory complications will improve Outcome: Progressing Goal: Cardiovascular complication will be avoided Outcome: Progressing   Problem: Activity: Goal: Risk for activity intolerance will decrease Outcome: Progressing   Problem: Coping: Goal: Level of anxiety will decrease Outcome: Progressing   Problem: Safety: Goal: Ability to remain free from injury will improve Outcome: Progressing   Problem: Skin Integrity: Goal: Risk for impaired skin integrity will decrease Outcome: Progressing   Problem: Education: Goal: Understanding of cardiac disease, CV risk reduction, and recovery process will improve Outcome: Progressing Goal: Individualized Educational Video(s) Outcome: Progressing   Problem: Activity: Goal: Ability to tolerate increased activity will improve Outcome: Progressing   Problem: Cardiac: Goal: Ability to achieve and maintain adequate cardiovascular perfusion will improve Outcome: Progressing   Problem: Education: Goal: Understanding of CV disease, CV risk reduction, and recovery process will improve Outcome: Progressing Goal: Individualized Educational Video(s) Outcome: Progressing   Problem: Activity: Goal: Ability to return to baseline activity level will improve Outcome: Progressing   Problem: Cardiovascular: Goal: Ability to achieve and maintain adequate cardiovascular perfusion will improve Outcome: Progressing Goal: Vascular access site(s) Level 0-1 will be  maintained Outcome: Progressing   Problem: Health Behavior/Discharge Planning: Goal: Ability to safely manage health-related needs after discharge will improve Outcome: Progressing

## 2023-09-13 NOTE — TOC CM/SW Note (Signed)
 Transition of Care [facility]) - Inpatient Brief Assessment   Patient Details  Name: Isaiah [name], Isaiah [name] MRN: [id number] Date of Birth: 04/03/1960  [facility] CM/SW Contact:    Sudie Erminio Deems, RN Phone Number: 09/13/2023, 3:43 PM   Clinical Narrative: Patient presented for shortness of breath-plan for [facility]. PTA patient was independent from home and has support of significant other. Patient has PCP and transportation. No further needs identified at this time. Case Manager will continue to follow for disposition needs.       Transition of Care Asessment: Insurance and Status: Insurance coverage has been reviewed Patient has primary care physician: Yes Home environment has been reviewed: reviewed Prior level of function:: indpendent Prior/Current Home Services: No current home services Social Drivers of Health Review: SDOH reviewed no interventions necessary Readmission risk has been reviewed: Yes Transition of care needs: no transition of care needs at this time

## 2023-09-13 NOTE — Progress Notes (Signed)
 Progress Note  Patient Name: Isaiah [name], [name] Date of Encounter: 09/13/2023  Primary Cardiologist: Shakeena Kafer, DO   Subjective   Patient seen and examined by his bedside.  He offers no complaints at this time.  He is looking forward to his heart catheterization today.  He is getting IV hydration given his creatinine   Inpatient Medications    Scheduled Meds:  amLODipine   5 mg Oral Daily   enoxaparin  (LOVENOX ) injection  40 mg Subcutaneous Q24H   ezetimibe   10 mg Oral Daily   lisinopril   20 mg Oral Daily   metoprolol  succinate  12.5 mg Oral Daily   pantoprazole   40 mg Oral Daily   propafenone   325 mg Oral BID   tamsulosin   0.4 mg Oral QPM   Continuous Infusions:  sodium chloride  1 mL/kg/hr (09/13/23 0540)   PRN Meds: ALPRAZolam    Vital Signs    Vitals:   09/12/23 1617 09/12/23 1948 09/13/23 0615 09/13/23 0813  BP: 125/86 130/81 (!) 137/93 126/79  Pulse: 62 61 76 62  Resp: 16 18 18 18   Temp: 98.2 F (36.8 C) 98 F (36.7 C) 97.6 F (36.4 C)   TempSrc: Oral Oral Oral Oral  SpO2: 99% 92% 95% 96%  Weight:      Height:        Intake/Output Summary (Last 24 hours) at 09/13/2023 0816 Last data filed at 09/12/2023 2300 Gross per 24 hour  Intake 720 ml  Output --  Net 720 ml   Filed Weights   09/11/23 2345  Weight: 115.8 kg    Telemetry    Sinus bradycardia with arrhythmia- Personally Reviewed  ECG     - Personally Reviewed  Physical Exam     General: Comfortable,  Head: Atraumatic, normal size  Eyes: PEERLA, EOMI  Neck: Supple, normal JVD Cardiac: Normal S1, S2; RRR; no murmurs, rubs, or gallops Lungs: Clear to auscultation bilaterally Abd: Soft, nontender, no hepatomegaly  Ext: warm, no edema Musculoskeletal: No deformities, BUE and BLE strength normal and equal Skin: Warm and dry, no rashes   Neuro: Alert and oriented to person, place, time, and situation, CNII-XII grossly intact, no focal deficits  Psych: Normal mood and affect   Labs     Chemistry Recent Labs  Lab 09/11/23 1403 09/12/23 0607 09/13/23 0359  NA 135 137 136  K 4.0 4.5 4.8  CL 106 105 106  CO2 17* 24 25  GLUCOSE 129* 97 100*  BUN 17 14 15   CREATININE 1.50* 1.56* 1.56*  CALCIUM 9.7 9.2 9.1  PROT  --  6.0*  --   ALBUMIN  --  3.0*  --   AST  --  37  --   ALT  --  76*  --   ALKPHOS  --  58  --   BILITOT  --  0.7  --   GFRNONAA 53* 50* 50*  ANIONGAP 13 8 5      Hematology Recent Labs  Lab 09/11/23 1403 09/12/23 0607  WBC 5.0 4.1  RBC 5.34 5.35  HGB 15.9 15.6  HCT 47.8 48.9  MCV 89.5 91.4  MCH 29.8 29.2  MCHC 33.3 31.9  RDW 18.9* 19.0*  PLT 164 183    Cardiac EnzymesNo results for input(s): TROPONINI in the last 168 hours. No results for input(s): TROPIPOC in the last 168 hours.   BNPNo results for input(s): BNP, PROBNP in the last 168 hours.   DDimer No results for input(s): DDIMER in the last 168  hours.   Radiology    ECHOCARDIOGRAM COMPLETE Result Date: 09/12/2023    ECHOCARDIOGRAM REPORT   Patient Name:   BENTLEY FISSEL Date of Exam: 09/12/2023 Medical Rec #:  [id number]       Height:       75.0 in Accession #:    [id number]      Weight:       255.2 lb Date of Birth:  Jan 09, 1961       BSA:          2.434 m Patient Age:    63 years        BP:           135/78 mmHg Patient Gender: M               HR:           61 bpm. Exam Location:  Inpatient Procedure: 2D Echo, Cardiac Doppler and Color Doppler (Both Spectral and Color            Flow Doppler were utilized during procedure). Indications:    Dyspnea  History:        Patient has prior history of Echocardiogram examinations, most                 recent 08/26/2022. Angina, Arrythmias:Atrial Fibrillation,                 Signs/Symptoms:Chest Pain and Dyspnea; Risk                 Factors:Hypertension, Dyslipidemia, Sleep Apnea and Vape use.  Sonographer:    Juliene Rucks Referring Phys: [id number] ZANE ADAMS IMPRESSIONS  1. Left ventricular ejection fraction, by estimation, is 55 to 60%.  The left ventricle has normal function. The left ventricle has no regional wall motion abnormalities. There is moderate concentric left ventricular hypertrophy. Left ventricular diastolic parameters are consistent with Grade I diastolic dysfunction (impaired relaxation).  2. Right ventricular systolic function is normal. The right ventricular size is normal. There is normal pulmonary artery systolic pressure. The estimated right ventricular systolic pressure is 25.0 mmHg.  3. The mitral valve is degenerative. Trivial mitral valve regurgitation. No evidence of mitral stenosis. Moderate mitral annular calcification.  4. The aortic valve is tricuspid. There is mild calcification of the aortic valve. Aortic valve regurgitation is not visualized. No aortic stenosis is present.  5. The inferior vena cava is dilated in size with <50% respiratory variability, suggesting right atrial pressure of 15 mmHg. FINDINGS  Left Ventricle: Left ventricular ejection fraction, by estimation, is 55 to 60%. The left ventricle has normal function. The left ventricle has no regional wall motion abnormalities. The left ventricular internal cavity size was normal in size. There is  moderate concentric left ventricular hypertrophy. Left ventricular diastolic parameters are consistent with Grade I diastolic dysfunction (impaired relaxation). Right Ventricle: The right ventricular size is normal. No increase in right ventricular wall thickness. Right ventricular systolic function is normal. There is normal pulmonary artery systolic pressure. The tricuspid regurgitant velocity is 1.58 m/s, and  with an assumed right atrial pressure of 15 mmHg, the estimated right ventricular systolic pressure is 25.0 mmHg. Left Atrium: Left atrial size was normal in size. Right Atrium: Right atrial size was normal in size. Pericardium: There is no evidence of pericardial effusion. Mitral Valve: The mitral valve is degenerative in appearance. Moderate mitral  annular calcification. Trivial mitral valve regurgitation. No evidence of mitral valve stenosis. Tricuspid Valve: The tricuspid valve is normal  in structure. Tricuspid valve regurgitation is trivial. Aortic Valve: The aortic valve is tricuspid. There is mild calcification of the aortic valve. Aortic valve regurgitation is not visualized. No aortic stenosis is present. Pulmonic Valve: The pulmonic valve was normal in structure. Pulmonic valve regurgitation is not visualized. Aorta: The aortic root is normal in size and structure. Venous: The inferior vena cava is dilated in size with less than 50% respiratory variability, suggesting right atrial pressure of 15 mmHg. IAS/Shunts: No atrial level shunt detected by color flow Doppler.  LEFT VENTRICLE PLAX 2D LVIDd:         4.90 cm      Diastology LVIDs:         3.40 cm      LV e' medial:    5.77 cm/s LV PW:         1.30 cm      LV E/e' medial:  13.3 LV IVS:        1.60 cm      LV e' lateral:   6.96 cm/s LVOT diam:     2.10 cm      LV E/e' lateral: 11.1 LV SV:         87 LV SV Index:   36 LVOT Area:     3.46 cm  LV Volumes (MOD) LV vol d, MOD A4C: 215.0 ml LV vol s, MOD A4C: 101.0 ml LV SV MOD A4C:     215.0 ml RIGHT VENTRICLE             IVC RV Basal diam:  3.90 cm     IVC diam: 2.10 cm RV Mid diam:    3.10 cm RV S prime:     18.30 cm/s TAPSE (M-mode): 2.2 cm LEFT ATRIUM           Index        RIGHT ATRIUM           Index LA diam:      3.50 cm 1.44 cm/m   RA Area:     12.60 cm LA Vol (A2C): 28.1 ml 11.55 ml/m  RA Volume:   30.50 ml  12.53 ml/m LA Vol (A4C): 70.2 ml 28.84 ml/m  AORTIC VALVE LVOT Vmax:   126.00 cm/s LVOT Vmean:  85.900 cm/s LVOT VTI:    0.250 m  AORTA Ao Root diam: 4.30 cm Ao Asc diam:  3.60 cm MITRAL VALVE               TRICUSPID VALVE MV Area (PHT): 2.73 cm    TR Peak grad:   10.0 mmHg MV Decel Time: 278 msec    TR Vmax:        158.00 cm/s MV E velocity: 77.00 cm/s MV A velocity: 89.10 cm/s  SHUNTS MV E/A ratio:  0.86        Systemic VTI:  0.25 m                             Systemic Diam: 2.10 cm Dalton McleanMD Electronically signed by Ezra Kanner Signature Date/Time: 09/12/2023/5:33:51 PM    Final    CT Angio Chest PE W and/or Wo Contrast Result Date: 09/11/2023 CLINICAL DATA:  Evaluate for pulmonary embolism. EXAM: CT ANGIOGRAPHY CHEST WITH CONTRAST TECHNIQUE: Multidetector CT imaging of the chest was performed using the standard protocol during bolus administration of intravenous contrast. Multiplanar CT image reconstructions and MIPs were obtained to evaluate the vascular anatomy.  RADIATION DOSE REDUCTION: This exam was performed according to the departmental dose-optimization program which includes automated exposure control, adjustment of the mA and/or kV according to patient size and/or use of iterative reconstruction technique. CONTRAST:  OMNIPAQUE  IOHEXOL  350 MG/ML SOLN COMPARISON:  Chest radiograph September 11 2023 FINDINGS: Exam is slightly suboptimal due to overlying artifact. Cardiovascular: Satisfactory opacification of the pulmonary arteries to the segmental level. No evidence of pulmonary embolism. Normal heart size. No pericardial effusion. Atherosclerotic calcifications of coronary arteries Mediastinum/Nodes: No enlarged mediastinal, hilar, or axillary lymph nodes. Thyroid  gland, trachea, and esophagus demonstrate no significant findings. Lungs/Pleura:. No suspicious pulmonary nodule. Scattered calcified micro nodules. No consolidation or architectural distortion. Upper Abdomen: Incidental note was made of retroaortic left renal vein, normal variation. Right kidney cortical cyst which does not require imaging follow-up. Colonic diverticulosis. Musculoskeletal: Degenerative changes of the spine. Review of the MIP images confirms the above findings. IMPRESSION: No suspicious finding to suggest pulmonary embolism. Retroaortic left renal vein, normal variation. Atherosclerotic calcifications of aorta and coronary arteries.  Electronically Signed   By: Megan  Zare M.D.   On: 09/11/2023 15:44    Cardiac Studies   Echocardiogram  Patient Profile     63 y.o. male  a hx of  obesity, OSA on CPAP, HTN, HLD, prior history of DVT/PE (abnormal lupus anticoagulant) on Xarelto , chronic myalgias since remote Lyme disease 2014, paroxysmal A-fib, hypothyroidism, CHF, CKD, arthritis.   Assessment & Plan    Dyspnea on exertion which is suspected to be anginal equivalent Paroxysmal defibrillation Sinus rhythm Sinus rhythm Coronary artery disease on coronary CTA which was done in 2018   He still is symptomatic today for a right and left heart catheterization further recommendations based on result of this. His echocardiogram showed normal structure and function.  His Lopressor  was decreased yet he is bradycardic no changes today.  Will continue his current medication regimen.  In terms of his hyperlipidemia he is on Repatha  in outpatient setting and Zetia  we have continued his Zetia  here.  Blood pressure is acceptable, continue with current antihypertensive regimen.  Informed Consent   Shared Decision Making/Informed Consent The risks [stroke (1 in 1000), death (1 in 1000), kidney failure [usually temporary] (1 in 500), bleeding (1 in 200), allergic reaction [possibly serious] (1 in 200)], benefits (diagnostic support and management of coronary artery disease) and alternatives of a cardiac catheterization were discussed in detail with Mr. Dayrit and he is willing to proceed.       For questions or updates, please contact [facility] Please consult [email or web address] for contact info under Cardiology/STEMI.      Signed, Lynkin Saini, DO  09/13/2023, 8:16 AM

## 2023-09-13 NOTE — Progress Notes (Signed)
 TR band removed. Pt tolerated well. Site is a level 0. Gauze & clear dressing applied. Pt advised & educated on rt radial site care. Pt verbalized understanding & was not in any distress. Carolena Fairbank R, RN

## 2023-09-13 NOTE — Progress Notes (Signed)
 PROGRESS NOTE    Isaiah [name], [name]  FMW:[id number] DOB: [birth year] DOA: 09/11/2023 PCP: Nichole Senior, MD   Brief Narrative:  This 63 yrs old male with PMH significant for hypertension, chronic kidney disease stage III, recurrent PE/DVT on Xarelto , sleep apnea, GERD, hypothyroidism, hyperlipidemia who had recently traveled to [place]  and was [place] the [place] up to 14,000 [place] high was using acetazolamide  , He had developed short of breath in last 4 to 5 days initially patient thought it was from the [facility] in the mountains but despite coming to ground-level patient still feels short of breath, He presents to the ER.  Denies chest pain,  productive cough,  fever or chills.  Has not missed his dose of Xarelto .  Patient's shortness of breath was present even at rest and increases with exertion. CT angiogram of the chest was negative for PE but did show coronary calcifications.  Troponins were 28 and 26 but EKG shows normal sinus rhythm.  PVCs.  Cardiology was consulted,  Patient was admitted for further observation.  Labs showed creatinine of 1.5 hemoglobin 15.9.  Cardiologist evaluated the patient and recommended to continue coronary CT.  Patient reports feeling better,  still has some shortness of breath.   Assessment & Plan:   Principal Problem:   Elevated troponin Active Problems:   HTN (hypertension)   PAF (paroxysmal atrial fibrillation) (HCC)   Hypothyroidism- followed by Dr. Senior Nichole of Endo   h/o Pulmonary embolism and infarction (HCC)   Dyslipidemia   OSA (obstructive sleep apnea)   Chronic kidney disease (CKD), stage III (moderate) (HCC)   Unstable angina (HCC)  Elevated troponin with dyspnea : Patient presented with shortness of breath associated with elevated troponins concerning for unstable angina.   Appreciate cardiology consult.  Initially planning for CCTA. Continue lisinopril , amlodipine , metoprolol , Xarelto . Cardiology has decided to do right and left heart cath  tentatively scheduled for today.  Essential Hypertension:  Continue lisinopril ,  amlodipine  and metoprolol .  History of recurrent PE and DVT :  Continue Xarelto .  Chronic kidney disease stage IIIa: Creatinine at around baseline.  Sleep apnea:  Continue  CPAP at bedtime.  Hyperlipidemia : Continue Zetia .  Hypothyroidism:  Continue  Synthroid .  Paroxysmal atrial fibrillation : Heart rate controlled in sinus rhythm.  Continue Metoprolol  and Xarelto   GERD : Continue PPI.    DVT prophylaxis: Xarelto  Code Status:Full code Family Communication: Wife at bedside. Disposition Plan:    Status is: Observation The patient remains OBS appropriate and will d/c before 2 midnights.   Patient admitted for elevated troponin associated with shortness of breath concerning for unstable angina.   Cardiology tentatively scheduled for left heart catheter today.  Consultants:  Cardiology  Procedures: CT   Antimicrobials: Anti-infectives (From admission, onward)    None       Subjective: Patient was seen and examined at bedside.  Overnight events noted. Patient denies any chest pain but continued to report shortness of breath with deep breathing. Patient is tentatively scheduled for left heart cath today  Objective: Vitals:   09/12/23 1948 09/13/23 0615 09/13/23 0813 09/13/23 1205  BP: 130/81 (!) 137/93 126/79 131/72  Pulse: 61 76 62 (!) 52  Resp: 18 18 18 18   Temp: 98 F (36.7 C) 97.6 F (36.4 C) 98.2 F (36.8 C) 97.9 F (36.6 C)  TempSrc: Oral Oral Oral Oral  SpO2: 92% 95% 96% 97%  Weight:      Height:        Intake/Output Summary (Last  24 hours) at 09/13/2023 1223 Last data filed at 09/12/2023 2300 Gross per 24 hour  Intake 720 ml  Output --  Net 720 ml   Filed Weights   09/11/23 2345  Weight: 115.8 kg    Examination:  General exam: Appears calm and comfortable, not in any acute distress.  Respiratory system: CTA Bilaterally . Respiratory effort normal. RR  16 Cardiovascular system: S1 & S2 heard, RRR. No JVD, murmurs, rubs, gallops or clicks.  Gastrointestinal system: Abdomen is non distended, soft and non tender.  Normal bowel sounds heard. Central nervous system: Alert and oriented x 3. No focal neurological deficits. Extremities: No edema, no cyanosis, no clubbing. Skin: No rashes, lesions or ulcers Psychiatry: Judgement and insight appear normal. Mood & affect appropriate.     Data Reviewed: I have personally reviewed following labs and imaging studies  CBC: Recent Labs  Lab 09/11/23 1403 09/12/23 0607  WBC 5.0 4.1  NEUTROABS  --  2.8  HGB 15.9 15.6  HCT 47.8 48.9  MCV 89.5 91.4  PLT 164 183   Basic Metabolic Panel: Recent Labs  Lab 09/11/23 1403 09/12/23 0607 09/13/23 0359  NA 135 137 136  K 4.0 4.5 4.8  CL 106 105 106  CO2 17* 24 25  GLUCOSE 129* 97 100*  BUN 17 14 15   CREATININE 1.50* 1.56* 1.56*  CALCIUM 9.7 9.2 9.1   GFR: Estimated Creatinine Clearance: 68.2 mL/min (A) (by C-G formula based on SCr of 1.56 mg/dL (H)). Liver Function Tests: Recent Labs  Lab 09/12/23 0607  AST 37  ALT 76*  ALKPHOS 58  BILITOT 0.7  PROT 6.0*  ALBUMIN 3.0*   No results for input(s): LIPASE, AMYLASE in the last 168 hours. No results for input(s): AMMONIA in the last 168 hours. Coagulation Profile: No results for input(s): INR, PROTIME in the last 168 hours. Cardiac Enzymes: No results for input(s): CKTOTAL, CKMB, CKMBINDEX, TROPONINI in the last 168 hours. BNP (last 3 results) No results for input(s): PROBNP in the last 8760 hours. HbA1C: No results for input(s): HGBA1C in the last 72 hours. CBG: No results for input(s): GLUCAP in the last 168 hours. Lipid Profile: No results for input(s): CHOL, HDL, LDLCALC, TRIG, CHOLHDL, LDLDIRECT in the last 72 hours. Thyroid  Function Tests: Recent Labs    09/12/23 0607  TSH 1.499   Anemia Panel: No results for input(s): VITAMINB12,  FOLATE, FERRITIN, TIBC, IRON, RETICCTPCT in the last 72 hours. Sepsis Labs: No results for input(s): PROCALCITON, LATICACIDVEN in the last 168 hours.  No results found for this or any previous visit (from the past 240 hours).   Radiology Studies: ECHOCARDIOGRAM COMPLETE Result Date: 09/12/2023    ECHOCARDIOGRAM REPORT   Patient Name:   Isaiah [name] Date of Exam: 09/12/2023 Medical Rec #:  [id number]       Height:       75.0 in Accession #:    [id number]      Weight:       255.2 lb Date of Birth:  1960/06/12       BSA:          2.434 m Patient Age:    63 years        BP:           135/78 mmHg Patient Gender: M               HR:           61 bpm. Exam Location:  Inpatient Procedure:  2D Echo, Cardiac Doppler and Color Doppler (Both Spectral and Color            Flow Doppler were utilized during procedure). Indications:    Dyspnea  History:        Patient has prior history of Echocardiogram examinations, most                 recent 08/26/2022. Angina, Arrythmias:Atrial Fibrillation,                 Signs/Symptoms:Chest Pain and Dyspnea; Risk                 Factors:Hypertension, Dyslipidemia, Sleep Apnea and Vape use.  Sonographer:    Juliene Rucks Referring Phys: [id number] ZANE ADAMS IMPRESSIONS  1. Left ventricular ejection fraction, by estimation, is 55 to 60%. The left ventricle has normal function. The left ventricle has no regional wall motion abnormalities. There is moderate concentric left ventricular hypertrophy. Left ventricular diastolic parameters are consistent with Grade I diastolic dysfunction (impaired relaxation).  2. Right ventricular systolic function is normal. The right ventricular size is normal. There is normal pulmonary artery systolic pressure. The estimated right ventricular systolic pressure is 25.0 mmHg.  3. The mitral valve is degenerative. Trivial mitral valve regurgitation. No evidence of mitral stenosis. Moderate mitral annular calcification.  4. The aortic valve is  tricuspid. There is mild calcification of the aortic valve. Aortic valve regurgitation is not visualized. No aortic stenosis is present.  5. The inferior vena cava is dilated in size with <50% respiratory variability, suggesting right atrial pressure of 15 mmHg. FINDINGS  Left Ventricle: Left ventricular ejection fraction, by estimation, is 55 to 60%. The left ventricle has normal function. The left ventricle has no regional wall motion abnormalities. The left ventricular internal cavity size was normal in size. There is  moderate concentric left ventricular hypertrophy. Left ventricular diastolic parameters are consistent with Grade I diastolic dysfunction (impaired relaxation). Right Ventricle: The right ventricular size is normal. No increase in right ventricular wall thickness. Right ventricular systolic function is normal. There is normal pulmonary artery systolic pressure. The tricuspid regurgitant velocity is 1.58 m/s, and  with an assumed right atrial pressure of 15 mmHg, the estimated right ventricular systolic pressure is 25.0 mmHg. Left Atrium: Left atrial size was normal in size. Right Atrium: Right atrial size was normal in size. Pericardium: There is no evidence of pericardial effusion. Mitral Valve: The mitral valve is degenerative in appearance. Moderate mitral annular calcification. Trivial mitral valve regurgitation. No evidence of mitral valve stenosis. Tricuspid Valve: The tricuspid valve is normal in structure. Tricuspid valve regurgitation is trivial. Aortic Valve: The aortic valve is tricuspid. There is mild calcification of the aortic valve. Aortic valve regurgitation is not visualized. No aortic stenosis is present. Pulmonic Valve: The pulmonic valve was normal in structure. Pulmonic valve regurgitation is not visualized. Aorta: The aortic root is normal in size and structure. Venous: The inferior vena cava is dilated in size with less than 50% respiratory variability, suggesting right  atrial pressure of 15 mmHg. IAS/Shunts: No atrial level shunt detected by color flow Doppler.  LEFT VENTRICLE PLAX 2D LVIDd:         4.90 cm      Diastology LVIDs:         3.40 cm      LV e' medial:    5.77 cm/s LV PW:         1.30 cm      LV E/e'  medial:  13.3 LV IVS:        1.60 cm      LV e' lateral:   6.96 cm/s LVOT diam:     2.10 cm      LV E/e' lateral: 11.1 LV SV:         87 LV SV Index:   36 LVOT Area:     3.46 cm  LV Volumes (MOD) LV vol d, MOD A4C: 215.0 ml LV vol s, MOD A4C: 101.0 ml LV SV MOD A4C:     215.0 ml RIGHT VENTRICLE             IVC RV Basal diam:  3.90 cm     IVC diam: 2.10 cm RV Mid diam:    3.10 cm RV S prime:     18.30 cm/s TAPSE (M-mode): 2.2 cm LEFT ATRIUM           Index        RIGHT ATRIUM           Index LA diam:      3.50 cm 1.44 cm/m   RA Area:     12.60 cm LA Vol (A2C): 28.1 ml 11.55 ml/m  RA Volume:   30.50 ml  12.53 ml/m LA Vol (A4C): 70.2 ml 28.84 ml/m  AORTIC VALVE LVOT Vmax:   126.00 cm/s LVOT Vmean:  85.900 cm/s LVOT VTI:    0.250 m  AORTA Ao Root diam: 4.30 cm Ao Asc diam:  3.60 cm MITRAL VALVE               TRICUSPID VALVE MV Area (PHT): 2.73 cm    TR Peak grad:   10.0 mmHg MV Decel Time: 278 msec    TR Vmax:        158.00 cm/s MV E velocity: 77.00 cm/s MV A velocity: 89.10 cm/s  SHUNTS MV E/A ratio:  0.86        Systemic VTI:  0.25 m                            Systemic Diam: 2.10 cm Dalton McleanMD Electronically signed by Ezra Kanner Signature Date/Time: 09/12/2023/5:33:51 PM    Final    CT Angio Chest PE W and/or Wo Contrast Result Date: 09/11/2023 CLINICAL DATA:  Evaluate for pulmonary embolism. EXAM: CT ANGIOGRAPHY CHEST WITH CONTRAST TECHNIQUE: Multidetector CT imaging of the chest was performed using the standard protocol during bolus administration of intravenous contrast. Multiplanar CT image reconstructions and MIPs were obtained to evaluate the vascular anatomy. RADIATION DOSE REDUCTION: This exam was performed according to the departmental  dose-optimization program which includes automated exposure control, adjustment of the mA and/or kV according to patient size and/or use of iterative reconstruction technique. CONTRAST:  OMNIPAQUE  IOHEXOL  350 MG/ML SOLN COMPARISON:  Chest radiograph September 11 2023 FINDINGS: Exam is slightly suboptimal due to overlying artifact. Cardiovascular: Satisfactory opacification of the pulmonary arteries to the segmental level. No evidence of pulmonary embolism. Normal heart size. No pericardial effusion. Atherosclerotic calcifications of coronary arteries Mediastinum/Nodes: No enlarged mediastinal, hilar, or axillary lymph nodes. Thyroid  gland, trachea, and esophagus demonstrate no significant findings. Lungs/Pleura:. No suspicious pulmonary nodule. Scattered calcified micro nodules. No consolidation or architectural distortion. Upper Abdomen: Incidental note was made of retroaortic left renal vein, normal variation. Right kidney cortical cyst which does not require imaging follow-up. Colonic diverticulosis. Musculoskeletal: Degenerative changes of the spine. Review of the MIP images confirms the  above findings. IMPRESSION: No suspicious finding to suggest pulmonary embolism. Retroaortic left renal vein, normal variation. Atherosclerotic calcifications of aorta and coronary arteries. Electronically Signed   By: Megan  Zare M.D.   On: 09/11/2023 15:44   Scheduled Meds:  amLODipine   5 mg Oral Daily   enoxaparin  (LOVENOX ) injection  40 mg Subcutaneous Q24H   ezetimibe   10 mg Oral Daily   lisinopril   20 mg Oral Daily   metoprolol  succinate  12.5 mg Oral Daily   pantoprazole   40 mg Oral Daily   propafenone   325 mg Oral BID   tamsulosin   0.4 mg Oral QPM   Continuous Infusions:  sodium chloride  1 mL/kg/hr (09/13/23 1118)     LOS: 0 days    Time spent: 50 mins    Darcel Dawley, MD [facility]   If 7PM-7AM, please contact night-coverage

## 2023-09-13 NOTE — Progress Notes (Signed)
 Pt has order for Rythmol  SR 325mg  BID ordered, per Levorn Gaskins [facility] we do not carry that dosage of the SR medication. Pt has already sent all of his home meds home with family earlier today. Pharmacist contacted on call provider and order was placed for Rythmol  SR 225 mg as a one time order. Per Pharmacist pt will need to have family bring his home med back to hospital to be dispensed by pharmacy. Patient made aware of above and agreeable to take Rythmol  SR 225 mg for this evenings dose.

## 2023-09-14 ENCOUNTER — Encounter (HOSPITAL_COMMUNITY): Payer: Self-pay | Admitting: Cardiovascular Disease

## 2023-09-14 ENCOUNTER — Encounter (HOSPITAL_COMMUNITY): Admission: EM | Disposition: A | Discharge status: Home / Self Care | Payer: Self-pay | Source: Home / Self Care

## 2023-09-14 DIAGNOSIS — R0600 Dyspnea, unspecified: Secondary | ICD-10-CM | POA: Diagnosis not present

## 2023-09-14 DIAGNOSIS — R7989 Other specified abnormal findings of blood chemistry: Secondary | ICD-10-CM | POA: Diagnosis not present

## 2023-09-14 MED ORDER — METOPROLOL SUCCINATE ER 25 MG PO TB24: 12.5000 mg | ORAL_TABLET | Freq: Every day | ORAL | 0 refills | Status: AC

## 2023-09-14 MED ORDER — RIVAROXABAN 20 MG PO TABS: 20.0000 mg | ORAL_TABLET | Freq: Every day | ORAL | Status: DC

## 2023-09-14 MED ORDER — LEVOTHYROXINE SODIUM 100 MCG PO TABS
300.0000 ug | ORAL_TABLET | Freq: Every day | ORAL | Status: DC
  Administered 2023-09-14: 300 ug via ORAL
  Filled 2023-09-14: qty 3

## 2023-09-14 NOTE — Progress Notes (Addendum)
 Progress Note  Patient Name: Isaiah [name], [name] Date of Encounter: 09/14/2023 [facility] Cardiologist: Isaiah Mullins, DO   Interval Summary   Feeling well shortness of breath is improving.  Was able to get up and walk more yesterday. Denies any chest pain.  Vital Signs Vitals:   09/13/23 1600 09/13/23 2014 09/13/23 2308 09/14/23 0618  BP: 138/79 120/76 130/64 (!) 140/86  Pulse:  (!) 54 60 61  Resp: 18 19 18    Temp: 98 F (36.7 C) 98.4 F (36.9 C) 98 F (36.7 C)   TempSrc: Oral Oral Oral   SpO2: 96% 96% 98%   Weight:      Height:        Intake/Output Summary (Last 24 hours) at 09/14/2023 0732 Last data filed at 09/13/2023 1630 Gross per 24 hour  Intake 1000 ml  Output --  Net 1000 ml      09/11/2023   11:45 PM 07/27/2023    1:42 PM 08/19/2022    8:45 AM  Last 3 Weights  Weight (lbs) 255 lb 3.2 oz 272 lb 9.6 oz 251 lb 9.6 oz  Weight (kg) 115.758 kg 123.651 kg 114.125 kg      Telemetry/ECG  Telemetry showed sinus arrhythmia with resting heart rates typically in the 50s to 70s.  Has had some episodes of bradycardia with rates in the high 40s/low 50s - Personally Reviewed  Physical Exam  GEN: No acute distress.   Neck: No JVD Cardiac: Regularly irregular rhythm, no murmurs, rubs, or gallops. Pulses 2+ bilaterally. No hematoma or swelling seen on exam. Denies having any pain in hand. Respiratory: Clear to auscultation bilaterally. GI: Soft, nontender, non-distended  MS: No edema  Assessment & Plan   Dyspnea on exertion Elevated troponin Essentially flat and elevated troponins are most consistent with demand ischemia. Echocardiogram this hospitalization showed normal structure and function. Cardiac catheterization yesterday showed normal coronary arteries, mildly elevated wedge pressure of 13 with normal pulmonary pressure, and normal cardiac output.  It was suspected the mildly elevated wedge pressure was likely due to precath hydration.  Normal coronary  arteries on cardiac catheterization are reassuring that patient's shortness of breath is noncardiac in nature. Suspect that the patient's shortness of breath is likely pulmonary in nature.  It is reassuring that the patient's symptoms are improving.  The shortness of breath may have been related to acetazolamide  or some URI that was not tested for.   Atrial fibrillation CHA2DS2-VASc Score = 3 [CHF History: 1, HTN History: 1, Diabetes History: 0, Stroke History: 0, Vascular Disease History: 1, Age Score: 0, Gender Score: 0].  Therefore, the patient's annual risk of stroke is 3.2 %.    Stop Lovenox  Start Xarelto  20 mg daily Continue propafenone  325 mg twice daily   Hyperlipidemia Had adverse reaction to statins Continue home Repatha  after discharge Continue Zetia  10 mg daily   Hypertension Blood pressure is labile but slightly elevated at times. Continue amlodipine  5 mg daily Continue lisinopril  20 mg daily. Continue metoprolol  succinate 12.5 mg daily   [facility] will sign off.   The patient is ready for discharge today from a cardiac standpoint. Medication Recommendations: As above.  Decrease metoprolol  to 12.5 mg daily Other recommendations (labs, testing, etc): None Follow up as an outpatient: Keep outpatient follow-up with Isaiah [name] on 01/09/2024.  Will discuss with MD but do not think sooner follow-up will be needed as dyspnea is noncardiac in nature.  For questions or updates, please contact   [facility] Please consult [email or web address] for contact info under       Signed, Isaiah Clause, PA-C   Patient seen and examined, note reviewed with the signed Advanced Practice Provider. I personally reviewed laboratory data, imaging studies and relevant notes. I independently examined the patient and formulated the important aspects of the plan. I have personally discussed the plan with the patient and/or family. Comments or changes to the note/plan are indicated  below.  Patient seen and examined at his bedside. He reports feeling a lot better. Cardiac cath yesterday not impressive.  Please discharge him on the decreased dose of metoprolol  12.5 mg daily, Propafenone  325 mg twice a day., xeralto 20 mg daily, Amlodipine  5 mg daily, Zetia  10 mg daily and lisinopril  20 mg daily.  Isaiah Eschbach DO, MS [facility] Attending Cardiologist [facility]  [address] [place], [place] [address] [phone] Website: [email or web address]

## 2023-09-14 NOTE — Discharge Instructions (Signed)
 Radial Site Care Refer to this sheet in the next few weeks. These instructions provide you with information on caring for yourself after your procedure. Your caregiver may also give you more specific instructions. Your treatment has been planned according to current medical practices, but problems sometimes occur. Call your caregiver if you have any problems or questions after your procedure. HOME CARE INSTRUCTIONS  You may shower the day after the procedure.Remove the bandage (dressing) and gently wash the site with plain soap and water .Gently pat the site dry.   Do not apply powder or lotion to the site.   Do not submerge the affected site in water  for 3 to 5 days.   Inspect the site at least twice daily.   Do not flex or bend the affected arm for 24 hours.   No lifting over 5 pounds (2.3 kg) for 5 days after your procedure.   Do not drive home if you are discharged the same day of the procedure. Have someone else drive you.   You may drive 24 hours after the procedure unless otherwise instructed by your caregiver.  What to expect:  Any bruising will usually fade within 1 to 2 weeks.   Blood that collects in the tissue (hematoma) may be painful to the touch. It should usually decrease in size and tenderness within 1 to 2 weeks.  SEEK IMMEDIATE MEDICAL CARE IF:  You have unusual pain at the radial site.   You have redness, warmth, swelling, or pain at the radial site.   You have drainage (other than a small amount of blood on the dressing).   You have chills.   You have a fever or persistent symptoms for more than 72 hours.   You have a fever and your symptoms suddenly get worse.   Your arm becomes pale, cool, tingly, or numb.   You have heavy bleeding from the site. Hold pressure on the site.

## 2023-09-14 NOTE — Discharge Summary (Addendum)
 Physician Discharge Summary  ABDEL EFFINGER, [name] FMW:[id number] DOB: 26-Mar-1960 DOA: 09/11/2023  PCP: Nichole Senior, MD  Admit date: 09/11/2023  Discharge date: 09/14/2023  Admitted From: Home  Disposition:  Home  Recommendations for Outpatient Follow-up:  Follow up with PCP in 1-2 weeks. Please obtain BMP/CBC in one week. Advised to follow-up with Cardiology as scheduled. Patient underwent left heart cath which was unimpressive. Advised to reduce dose of metoprolol  to 12.5 mg daily.  Discharge medications: Metoprolol  12.5 mg daily,  Propafenone  325 mg twice a day,  xeralto 20 mg daily,  Amlodipine  5 mg daily,  Zetia  10 mg daily Lisinopril  20 mg daily.   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  Denies chest pain,  productive cough,  fever or chills.  Has not missed his dose of Xarelto .  Patient's shortness of breath was present even at rest and increases with exertion. CT angiogram of the chest was negative for PE but did show coronary calcifications.  Troponins were 28 and 26 but EKG shows normal sinus rhythm.  PVCs. Cardiology was consulted,  Patient was admitted for further observation.  Labs showed creatinine of 1.5,  hemoglobin 15.9.  Cardiologist evaluated the patient and recommended left heart cath which was unremarkable.  Patient medications were adjusted.Likely  noncardiac cause for shortness of breath. Cardiology signed off.  Patient feels better and wants to be discharged home.  Discharge Diagnoses:  Principal Problem:   Elevated troponin Active Problems:   HTN (hypertension)   PAF (paroxysmal atrial fibrillation) (HCC)   Hypothyroidism- followed by Dr. Senior Nichole of Endo   h/o Pulmonary embolism and infarction (HCC)   Dyslipidemia   OSA (obstructive sleep apnea)   Chronic kidney disease (CKD), stage III (moderate) (HCC)   Unstable angina (HCC)  Elevated troponin with dyspnea : Patient presented with shortness of breath associated with elevated troponins concerning for unstable angina.   Appreciate cardiology consult.  Initially planning for CCTA. Continue lisinopril , amlodipine , metoprolol , Xarelto . Cardiology recommended left heart cath which was unremarkable. Likely cause demand ischemia for elevated troponin. Noncardiac reason for shortness of breath. Cardiology signed off.   Essential Hypertension:  Continue lisinopril ,  amlodipine  and metoprolol .   History of recurrent PE and DVT :  Continue Xarelto .   Chronic kidney disease stage IIIa: Creatinine at around baseline.   Sleep apnea:  Continue  CPAP at bedtime.   Hyperlipidemia : Continue Zetia .   Hypothyroidism:  Continue  Synthroid .   Paroxysmal atrial fibrillation : Heart rate controlled in sinus rhythm.  Continue Metoprolol  and Xarelto    GERD : Continue PPI.      Discharge Instructions  Discharge Instructions     Call MD for:  difficulty breathing, headache or visual disturbances   Complete by: As directed    Call MD for:  persistant dizziness or light-headedness   Complete by: As directed    Call MD for:  persistant nausea and vomiting   Complete by:  As directed    Diet - low sodium heart healthy   Complete by: As directed    Diet general   Complete by: As directed    Discharge instructions   Complete by: As directed    Advised to follow-up with  primary care physician in 1 week. Advised to follow-up with cardiology as scheduled. Patient underwent left heart cath which was unimpressive. Advised to reduce dose of metoprolol  to 25 mg every 12 hours.   Increase activity slowly   Complete by: As directed       Allergies as of 09/14/2023       Reactions   Crestor [rosuvastatin Calcium] Other (See Comments)   hepatitis   Lipitor [atorvastatin Calcium] Other (See Comments)   Feels like someone hit him with 2x4        Medication List     STOP taking these medications    acetaZOLAMIDE  250 MG tablet Commonly known as: DIAMOX        TAKE these medications    ALPRAZolam  0.25 MG tablet Commonly known as: XANAX  Take 1 tablet by mouth 2 (two) times daily.   amLODipine  5 MG tablet Commonly known as: NORVASC  Take 1 tablet (5 mg total) by mouth daily.   ezetimibe  10 MG tablet Commonly known as: ZETIA  Take 10 mg by mouth at bedtime.   lisinopril  20 MG tablet Commonly known as: ZESTRIL  Take 1 tablet (20 mg total) by mouth daily.   metoprolol  succinate 25 MG 24 hr tablet Commonly known as: TOPROL -XL Take 0.5 tablets (12.5 mg total) by mouth daily. Start taking on: September 15, 2023 What changed: how much to take   omeprazole  20 MG capsule Commonly known as: PRILOSEC Take 1 capsule (20 mg total) by mouth daily.   propafenone  325 MG 12 hr capsule Commonly known as: RYTHMOL  SR Take 1 capsule (325 mg total) by mouth 2 (two) times daily.   Repatha  SureClick 140 MG/ML Soaj Generic drug: Evolocumab  Inject 140 mg as directed every 14 (fourteen) days.   rivaroxaban  20 MG Tabs tablet Commonly known as: Xarelto  Take 1 tablet (20 mg total) by mouth daily.   Synthroid  150 MCG tablet Generic drug: levothyroxine  Take 300 mcg by mouth daily.   tadalafil  5 MG tablet Commonly known as: CIALIS  Take 5 mg by mouth daily.   tamsulosin  0.4 MG Caps capsule Commonly known as: FLOMAX  Take 0.4 mg by mouth every evening.         Follow-up Information     Nichole Senior, MD Follow up in 1 week(s).   Specialty: Endocrinology Contact information: [address] [place] [address] [phone]                Allergies  Allergen Reactions   Crestor [Rosuvastatin Calcium] Other (See Comments)    hepatitis   Lipitor [Atorvastatin Calcium] Other (See Comments)    Feels like someone hit him with 2x4    Consultations: Cardiology   Procedures/Studies: CARDIAC CATHETERIZATION Result Date: 09/13/2023 1.  Normal left dominant coronary arteries. 2.  Left ventricular angiography was not performed.  EF was normal by echo. 3.  Right heart catheterization showed normal RA pressure, mildly elevated wedge pressure at 13 mmHg, normal pulmonary pressure and normal cardiac output. Recommendations: No culprit is identified for the patient's symptoms. Unremarkable right heart cath numbers per mildly elevated wedge pressures likely due to precath hydration.   ECHOCARDIOGRAM COMPLETE Result Date: 09/12/2023    ECHOCARDIOGRAM REPORT   Patient Name:   Isaiah [name] [facility]  Date of Exam: 09/12/2023 Medical Rec #:  [id number]       Height:       75.0 in Accession #:    [id number]      Weight:       255.2 lb Date of Birth:  Jun 11, 1960       BSA:          2.434 m Patient Age:    63 years        BP:           135/78 mmHg Patient Gender: M               HR:           61 bpm. Exam Location:  Inpatient Procedure: 2D Echo, Cardiac Doppler and Color Doppler (Both Spectral and Color            Flow Doppler were utilized during procedure). Indications:    Dyspnea  History:        Patient has prior history of Echocardiogram examinations, most                 recent 08/26/2022. Angina, Arrythmias:Atrial Fibrillation,                 Signs/Symptoms:Chest Pain and Dyspnea; Risk                 Factors:Hypertension, Dyslipidemia, Sleep Apnea and Vape use.  Sonographer:    Juliene Rucks Referring Phys: [id number] ZANE ADAMS IMPRESSIONS  1. Left ventricular  ejection fraction, by estimation, is 55 to 60%. The left ventricle has normal function. The left ventricle has no regional wall motion abnormalities. There is moderate concentric left ventricular hypertrophy. Left ventricular diastolic parameters are consistent with Grade I diastolic dysfunction (impaired relaxation).  2. Right ventricular systolic function is normal. The right ventricular size is normal. There is normal pulmonary artery systolic pressure. The estimated right ventricular systolic pressure is 25.0 mmHg.  3. The mitral valve is degenerative. Trivial mitral valve regurgitation. No evidence of mitral stenosis. Moderate mitral annular calcification.  4. The aortic valve is tricuspid. There is mild calcification of the aortic valve. Aortic valve regurgitation is not visualized. No aortic stenosis is present.  5. The inferior vena cava is dilated in size with <50% respiratory variability, suggesting right atrial pressure of 15 mmHg. FINDINGS  Left Ventricle: Left ventricular ejection fraction, by estimation, is 55 to 60%. The left ventricle has normal function. The left ventricle has no regional wall motion abnormalities. The left ventricular internal cavity size was normal in size. There is  moderate concentric left ventricular hypertrophy. Left ventricular diastolic parameters are consistent with Grade I diastolic dysfunction (impaired relaxation). Right Ventricle: The right ventricular size is normal. No increase in right ventricular wall thickness. Right ventricular systolic function is normal. There is normal pulmonary artery systolic pressure. The tricuspid regurgitant velocity is 1.58 m/s, and  with an assumed right atrial pressure of 15 mmHg, the estimated right ventricular systolic pressure is 25.0 mmHg. Left Atrium: Left atrial size was normal in size. Right Atrium: Right atrial size was normal in size. Pericardium: There is no evidence of pericardial effusion. Mitral Valve: The mitral valve is  degenerative in appearance. Moderate mitral annular calcification. Trivial mitral valve regurgitation. No evidence of mitral valve stenosis. Tricuspid Valve: The tricuspid valve is normal in structure. Tricuspid valve regurgitation is trivial. Aortic Valve: The aortic valve is tricuspid. There is mild calcification of the aortic valve. Aortic valve regurgitation is  not visualized. No aortic stenosis is present. Pulmonic Valve: The pulmonic valve was normal in structure. Pulmonic valve regurgitation is not visualized. Aorta: The aortic root is normal in size and structure. Venous: The inferior vena cava is dilated in size with less than 50% respiratory variability, suggesting right atrial pressure of 15 mmHg. IAS/Shunts: No atrial level shunt detected by color flow Doppler.  LEFT VENTRICLE PLAX 2D LVIDd:         4.90 cm      Diastology LVIDs:         3.40 cm      LV e' medial:    5.77 cm/s LV PW:         1.30 cm      LV E/e' medial:  13.3 LV IVS:        1.60 cm      LV e' lateral:   6.96 cm/s LVOT diam:     2.10 cm      LV E/e' lateral: 11.1 LV SV:         87 LV SV Index:   36 LVOT Area:     3.46 cm  LV Volumes (MOD) LV vol d, MOD A4C: 215.0 ml LV vol s, MOD A4C: 101.0 ml LV SV MOD A4C:     215.0 ml RIGHT VENTRICLE             IVC RV Basal diam:  3.90 cm     IVC diam: 2.10 cm RV Mid diam:    3.10 cm RV S prime:     18.30 cm/s TAPSE (M-mode): 2.2 cm LEFT ATRIUM           Index        RIGHT ATRIUM           Index LA diam:      3.50 cm 1.44 cm/m   RA Area:     12.60 cm LA Vol (A2C): 28.1 ml 11.55 ml/m  RA Volume:   30.50 ml  12.53 ml/m LA Vol (A4C): 70.2 ml 28.84 ml/m  AORTIC VALVE LVOT Vmax:   126.00 cm/s LVOT Vmean:  85.900 cm/s LVOT VTI:    0.250 m  AORTA Ao Root diam: 4.30 cm Ao Asc diam:  3.60 cm MITRAL VALVE               TRICUSPID VALVE MV Area (PHT): 2.73 cm    TR Peak grad:   10.0 mmHg MV Decel Time: 278 msec    TR Vmax:        158.00 cm/s MV E velocity: 77.00 cm/s MV A velocity: 89.10 cm/s  SHUNTS MV  E/A ratio:  0.86        Systemic VTI:  0.25 m                            Systemic Diam: 2.10 cm Dalton McleanMD Electronically signed by Ezra Kanner Signature Date/Time: 09/12/2023/5:33:51 PM    Final    CT Angio Chest PE W and/or Wo Contrast Result Date: 09/11/2023 CLINICAL DATA:  Evaluate for pulmonary embolism. EXAM: CT ANGIOGRAPHY CHEST WITH CONTRAST TECHNIQUE: Multidetector CT imaging of the chest was performed using the standard protocol during bolus administration of intravenous contrast. Multiplanar CT image reconstructions and MIPs were obtained to evaluate the vascular anatomy. RADIATION DOSE REDUCTION: This exam was performed according to the departmental dose-optimization program which includes automated exposure control, adjustment of the mA and/or kV according to  patient size and/or use of iterative reconstruction technique. CONTRAST:  OMNIPAQUE  IOHEXOL  350 MG/ML SOLN COMPARISON:  Chest radiograph September 11 2023 FINDINGS: Exam is slightly suboptimal due to overlying artifact. Cardiovascular: Satisfactory opacification of the pulmonary arteries to the segmental level. No evidence of pulmonary embolism. Normal heart size. No pericardial effusion. Atherosclerotic calcifications of coronary arteries Mediastinum/Nodes: No enlarged mediastinal, hilar, or axillary lymph nodes. Thyroid  gland, trachea, and esophagus demonstrate no significant findings. Lungs/Pleura:. No suspicious pulmonary nodule. Scattered calcified micro nodules. No consolidation or architectural distortion. Upper Abdomen: Incidental note was made of retroaortic left renal vein, normal variation. Right kidney cortical cyst which does not require imaging follow-up. Colonic diverticulosis. Musculoskeletal: Degenerative changes of the spine. Review of the MIP images confirms the above findings. IMPRESSION: No suspicious finding to suggest pulmonary embolism. Retroaortic left renal vein, normal variation. Atherosclerotic  calcifications of aorta and coronary arteries. Electronically Signed   By: Megan  Zare M.D.   On: 09/11/2023 15:44    Subjective: Patient was seen and examined at bedside.  Overnight events noted. Patient reports feeling much improved and wants to be discharged.  Denies any further symptoms  Discharge Exam: Vitals:   09/14/23 0618 09/14/23 0853  BP: (!) 140/86 131/78  Pulse: 61 62  Resp:  20  Temp:  98.2 F (36.8 C)  SpO2:  97%   Vitals:   09/13/23 2014 09/13/23 2308 09/14/23 0618 09/14/23 0853  BP: 120/76 130/64 (!) 140/86 131/78  Pulse: (!) 54 60 61 62  Resp: 19 18  20   Temp: 98.4 F (36.9 C) 98 F (36.7 C)  98.2 F (36.8 C)  TempSrc: Oral Oral  Oral  SpO2: 96% 98%  97%  Weight:      Height:        General: Pt is alert, awake, not in acute distress Cardiovascular: RRR, S1/S2 +, no rubs, no gallops Respiratory: CTA bilaterally, no wheezing, no rhonchi Abdominal: Soft, NT, ND, bowel sounds + Extremities: no edema, no cyanosis    The results of significant diagnostics from this hospitalization (including imaging, microbiology, ancillary and laboratory) are listed below for reference.     Microbiology: No results found for this or any previous visit (from the past 240 hours).   Labs: BNP (last 3 results) No results for input(s): BNP in the last 8760 hours. Basic Metabolic Panel: Recent Labs  Lab 09/11/23 1403 09/12/23 0607 09/13/23 0359 09/13/23 1453 09/13/23 1458  NA 135 137 136 140 139  K 4.0 4.5 4.8 3.9 4.0  CL 106 105 106  --   --   CO2 17* 24 25  --   --   GLUCOSE 129* 97 100*  --   --   BUN 17 14 15   --   --   CREATININE 1.50* 1.56* 1.56*  --   --   CALCIUM 9.7 9.2 9.1  --   --    Liver Function Tests: Recent Labs  Lab 09/12/23 0607  AST 37  ALT 76*  ALKPHOS 58  BILITOT 0.7  PROT 6.0*  ALBUMIN 3.0*   No results for input(s): LIPASE, AMYLASE in the last 168 hours. No results for input(s): AMMONIA in the last 168  hours. CBC: Recent Labs  Lab 09/11/23 1403 09/12/23 0607 09/13/23 1453 09/13/23 1458  WBC 5.0 4.1  --   --   NEUTROABS  --  2.8  --   --   HGB 15.9 15.6 15.3 15.6  HCT 47.8 48.9 45.0 46.0  MCV 89.5  91.4  --   --   PLT 164 183  --   --    Cardiac Enzymes: No results for input(s): CKTOTAL, CKMB, CKMBINDEX, TROPONINI in the last 168 hours. BNP: Invalid input(s): POCBNP CBG: No results for input(s): GLUCAP in the last 168 hours. D-Dimer No results for input(s): DDIMER in the last 72 hours. Hgb A1c No results for input(s): HGBA1C in the last 72 hours. Lipid Profile No results for input(s): CHOL, HDL, LDLCALC, TRIG, CHOLHDL, LDLDIRECT in the last 72 hours. Thyroid  function studies Recent Labs    09/12/23 0607  TSH 1.499   Anemia work up No results for input(s): VITAMINB12, FOLATE, FERRITIN, TIBC, IRON, RETICCTPCT in the last 72 hours. Urinalysis    Component Value Date/Time   COLORURINE YELLOW 05/06/2020 2116   APPEARANCEUR CLEAR 05/06/2020 2116   LABSPEC 1.025 05/06/2020 2116   PHURINE 6.0 05/06/2020 2116   GLUCOSEU NEGATIVE 05/06/2020 2116   HGBUR NEGATIVE 05/06/2020 2116   BILIRUBINUR NEGATIVE 05/06/2020 2116   KETONESUR NEGATIVE 05/06/2020 2116   PROTEINUR NEGATIVE 05/06/2020 2116   UROBILINOGEN 0.2 11/21/2014 1435   NITRITE NEGATIVE 05/06/2020 2116   LEUKOCYTESUR NEGATIVE 05/06/2020 2116   Sepsis Labs Recent Labs  Lab 09/11/23 1403 09/12/23 0607  WBC 5.0 4.1   Microbiology No results found for this or any previous visit (from the past 240 hours).   Time coordinating discharge: Over 30 minutes  SIGNED:   Darcel Dawley, MD  [facility] 09/14/2023, 2:00 PM Pager   If 7PM-7AM, please contact night-coverage

## 2023-09-27 ENCOUNTER — Ambulatory Visit (HOSPITAL_COMMUNITY)

## 2023-10-24 DIAGNOSIS — L0889 Other specified local infections of the skin and subcutaneous tissue: Secondary | ICD-10-CM | POA: Diagnosis not present

## 2023-10-24 DIAGNOSIS — Z85828 Personal history of other malignant neoplasm of skin: Secondary | ICD-10-CM | POA: Diagnosis not present

## 2023-10-24 DIAGNOSIS — B958 Unspecified staphylococcus as the cause of diseases classified elsewhere: Secondary | ICD-10-CM | POA: Diagnosis not present

## 2023-12-04 ENCOUNTER — Telehealth: Payer: Self-pay | Admitting: Cardiology

## 2023-12-04 DIAGNOSIS — Z85828 Personal history of other malignant neoplasm of skin: Secondary | ICD-10-CM | POA: Diagnosis not present

## 2023-12-04 DIAGNOSIS — L821 Other seborrheic keratosis: Secondary | ICD-10-CM | POA: Diagnosis not present

## 2023-12-04 DIAGNOSIS — L565 Disseminated superficial actinic porokeratosis (DSAP): Secondary | ICD-10-CM | POA: Diagnosis not present

## 2023-12-04 DIAGNOSIS — L738 Other specified follicular disorders: Secondary | ICD-10-CM | POA: Diagnosis not present

## 2023-12-04 NOTE — Telephone Encounter (Signed)
 Pt has been having trouble with CPAP and would like to know more about [employer]. Please advise.

## 2023-12-18 DIAGNOSIS — M7989 Other specified soft tissue disorders: Secondary | ICD-10-CM | POA: Diagnosis not present

## 2023-12-27 DIAGNOSIS — H57813 Brow ptosis, bilateral: Secondary | ICD-10-CM | POA: Diagnosis not present

## 2024-01-06 NOTE — Progress Notes (Unsigned)
 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1960, MRN [id number] PCP: Nichole Senior, MD  [facility] Providers Cardiologist:  Dub Huntsman, DO     PMH Lupus anticoagulant on anticoagulation PAF Pulmonary embolus OSA on CPAP Hypertension Frequent DVT Mild nonobstructive CAD Coronary CTA 08/18/2016 CAC score 44 (72nd percentile) <30% calcified disease in prox and mid LAD Right and left heart cath 09/13/2023 Normal left dominant coronary arteries Normal right heart pressures, mildly elevated wedge pressure at 13 mmHg Chronic HFpEF Hyperlipidemia Statin intolerance Aortic root dilatation CKD stage III  He has been followed by cardiology for many years and has maintained consistent follow-up.  He works as a education officer, community and enjoys riding motorcycles.  He has hypercoagulable state due to abnormal lupus anticoagulant on lifelong anticoagulation.  During a back surgery he was not bridged with Lovenox  and developed a small DVT in his left leg.  Lovenox  recommended for future surgeries.  Last cardiology clinic visit was 07/27/2023 with Josefa Beauvais, NP.  BP was initially elevated but improved to 136/82 on recheck.  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  Other physical activity somewhat limited by back surgeries.  Plan was to repeat echo 08/2023 for monitoring of aortic root dilatation and mitral valve insufficiency.  Admission 8/25-8/28/25 with shortness of breath for 3 days after traveling to [place] .  CTA chest PE was negative for PE, aortic and coronary artery calcifications noted, troponin was flat, EKG showed sinus rhythm with PACs, nonspecific ST-T wave changes.  He reported compliance with Xarelto , no missed doses.  He denied chest pain.  Felt like he was unable to get any air and and was having significant shortness of breath with exertion after his trip to [place] , was not able to walk in  the airport.  TTE 09/12/2023 revealed normal LVEF 55 to 60%, moderate LVH, grade 1 diastolic dysfunction, normal RV, mild calcification of the aortic valve.  Right and left heart cath 09/13/2023 revealed normal left dominant coronary arteries, normal RA pressure, mildly elevated wedge pressure at 13 mmHg, normal pulmonary pressure and normal cardiac output.  No culprit identified for patient's symptoms.   History of Present Illness  Discussed the use of AI scribe software for clinical note transcription with the patient, who gave verbal consent to proceed.  History of Present Illness Isaiah [name], [name] Isaiah [name] is a very pleasant 63 year old male who presents today for follow-up of hyperlipidemia. In August, he developed shortness of breath after returning from a motorcycle trip to high elevation, where he used Diamox  for altitude sickness prevention. He was concerned about PE given history of prior DVT.  At normal altitude he still has difficulty taking deep breaths at times, but overall shortness of breath has improved. PE and coronary artery obstruction were ruled out as causes. Echo revealed normal LVEF, G1DD, moderate LVH, mild calcification of aortic valve, no stenosis, moderate mitral annular calcification, no significant valve disease.  He is feeling well on Repatha  with no concerning side effects.  He continues to walk daily for exercise.  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  He is interested in pursuing Inspire device for management of sleep apnea. Not monitoring home BP recently. Was told he had elevated liver enzymes and is abstaining from alcohol. He is monitoring his diet to address mildly elevated triglycerides at 160 mg/dL. Had COVID pneumonia  in the past and suspects residual lung scarring may contribute to his breathing difficulty. He denies chest pain, palpitations, presyncope, syncope, orthopnea, and PND. Has mild LE edema at the end of the day that  improves overnight.    ROS: See HPI  Studies Reviewed     [facility] 09/13/23 1.  Normal left dominant coronary arteries. 2.  Left ventricular angiography was not performed.  EF was normal by echo. 3.  Right heart catheterization showed normal RA pressure, mildly elevated wedge pressure at 13 mmHg, normal pulmonary pressure and normal cardiac output.   Recommendations: No culprit is identified for the patient's symptoms. Unremarkable right heart cath numbers per mildly elevated wedge pressures likely due to precath hydration.    No results found for: LIPOA  Risk Assessment/Calculations  CHA2DS2-VASc Score = 3   This indicates a 3.2% annual risk of stroke. The patient's score is based upon: CHF History: 1 HTN History: 1 Diabetes History: 0 Stroke History: 0 Vascular Disease History: 1 Age Score: 0 Gender Score: 0            Physical Exam VS:  BP 110/84   Pulse 70   Ht 6' 3 (1.905 m)   Wt 260 lb 4.8 oz (118.1 kg)   SpO2 97%   BMI 32.54 kg/m    Wt Readings from Last 3 Encounters:  01/09/24 260 lb 4.8 oz (118.1 kg)  09/11/23 255 lb 3.2 oz (115.8 kg)  07/27/23 272 lb 9.6 oz (123.7 kg)    GEN: Well nourished, well developed in no acute distress NECK: No JVD; No carotid bruits CARDIAC: RRR, no murmurs, rubs, gallops RESPIRATORY:  Clear to auscultation without rales, wheezing or rhonchi  ABDOMEN: Soft, non-tender, non-distended EXTREMITIES:  No edema; No deformity    Assessment & Plan CAD Cardiac risk   Left heart catheterization 09/13/2023 showed no significant CAD.  Coronary CTA in 2018 revealed less than 30% calcified plaque in proximal and mid LAD. He denies chest pain, dyspnea, or other symptoms concerning for angina. No indication for further ischemic evaluation at this time. Lipids are well controlled with the exception of mildly elevated triglycerides. We discussed aiming for goal triglycerides 100 or lower.  He admits to some dietary indiscretion.  He is  working to reduce intake of sugar, alcohol, and other simple carbohydrates. - Continue Repatha  140 mg every 14 days - Continue amlodipine , lisinopril , metoprolol   Obstructive sleep apnea   Sleep apnea is managed with CPAP. Recent dyspnea felt to be secondary to diuretic use at high altitude. Right heart pressures are normal with no right ventricular enlargement.  He would like to pursue the inspire device. - Continue CPAP therapy - Management per sleep medicine  Hyperlipidemia LDL goal < 70 Hypertriglyceridemia Lipid panel completed 04/12/2023 with total cholesterol 95, HDL 56, LDL 7, triglycerides 160.  A1c is 5.7%.  We discussed excellent control of LDL, however triglycerides are elevated.  Recommend reducing intake of sugar, alcohol, and other simple carbohydrates.  He is tolerating Repatha  without any concerning side effects.  Has repeat lab work with PCP in a few days. - Continue Repatha  140 mg every 14 days - Heart healthy diet avoiding processed foods, saturated fat, sugar, and other simple carbohydrates encouraged - Be as physically active as possible every day and aim for at least 150 minutes of moderate intensity exercise each week  PAF on chronic anticoagulation HR is well controlled. Clinically appears to be in sinus rhythm. No palpitations, chest pain or SOB. No bleeding  concerns.  Creatinine clearance calculated using Cockcroft-Gault equation = 83 mL/min. Normal QTc on serial EKGs during hospital admission 08/2023.  - Continue Xarelto  20 mg daily which is appropriate dose for stroke prevention for CHA2DS2-VASc score of 3 - Continue propafenone , metoprolol  for rate and rhythm control - Follow-up lab testing with PCP to ensure stable renal and liver function  Hypertension BP is well controlled.  Admits he has not been monitoring at home recently.  Advised goal BP < 130/80.  No change in antihypertensive therapy today.  Has follow-up lab work with PCP in a few days. - Continue  amlodipine , metoprolol , lisinopril   Chronic kidney disease   He has been very careful to monitor kidney function over the past few years.  During recent hospital admission, SCr mildly elevated at 1.5- 1.56.  He feels this was secondary to dehydration. Having repeat renal function testing with PCP. No acute concerns today.  - Ensure adequate hydration - Continue current antihypertensive therapy        Dispo: 1 year with me (per pt request)  Signed, Rosaline Bane, NP-C "

## 2024-01-08 ENCOUNTER — Ambulatory Visit: Admitting: General Practice

## 2024-01-09 ENCOUNTER — Ambulatory Visit (HOSPITAL_BASED_OUTPATIENT_CLINIC_OR_DEPARTMENT_OTHER): Admitting: Nurse Practitioner

## 2024-01-09 ENCOUNTER — Encounter (HOSPITAL_BASED_OUTPATIENT_CLINIC_OR_DEPARTMENT_OTHER): Payer: Self-pay | Admitting: Nurse Practitioner

## 2024-01-09 VITALS — BP 110/84 | HR 70 | Ht 75.0 in | Wt 260.3 lb

## 2024-01-09 DIAGNOSIS — E781 Pure hyperglyceridemia: Secondary | ICD-10-CM

## 2024-01-09 DIAGNOSIS — Z79899 Other long term (current) drug therapy: Secondary | ICD-10-CM

## 2024-01-09 DIAGNOSIS — G4733 Obstructive sleep apnea (adult) (pediatric): Secondary | ICD-10-CM | POA: Diagnosis not present

## 2024-01-09 DIAGNOSIS — E785 Hyperlipidemia, unspecified: Secondary | ICD-10-CM | POA: Diagnosis not present

## 2024-01-09 DIAGNOSIS — I10 Essential (primary) hypertension: Secondary | ICD-10-CM | POA: Diagnosis not present

## 2024-01-09 DIAGNOSIS — I480 Paroxysmal atrial fibrillation: Secondary | ICD-10-CM | POA: Diagnosis not present

## 2024-01-09 DIAGNOSIS — E782 Mixed hyperlipidemia: Secondary | ICD-10-CM

## 2024-01-09 DIAGNOSIS — N1831 Chronic kidney disease, stage 3a: Secondary | ICD-10-CM

## 2024-01-09 DIAGNOSIS — Z7901 Long term (current) use of anticoagulants: Secondary | ICD-10-CM | POA: Diagnosis not present

## 2024-01-09 NOTE — Patient Instructions (Signed)
 Medication Instructions:   Your physician recommends that you continue on your current medications as directed. Please refer to the Current Medication list given to you today.   *If you need a refill on your cardiac medications before your next appointment, please call your pharmacy*  Lab Work:  None ordered.  If you have labs (blood work) drawn today and your tests are completely normal, you will receive your results only by: MyChart Message (if you have MyChart) OR A paper copy in the mail If you have any lab test that is abnormal or we need to change your treatment, we will call you to review the results.  Testing/Procedures:  None ordered.  Follow-Up: At [facility], you and your health needs are our priority.  As part of our continuing mission to provide you with exceptional heart care, our providers are all part of one team.  This team includes your primary Cardiologist (physician) and Advanced Practice Providers or APPs (Physician Assistants and Nurse Practitioners) who all work together to provide you with the care you need, when you need it.  Your next appointment:   1 year(s)  Provider:   Rosaline Bane, NP    We recommend signing up for the patient portal called MyChart.  Sign up information is provided on this After Visit Summary.  MyChart is used to connect with patients for Virtual Visits (Telemedicine).  Patients are able to view lab/test results, encounter notes, upcoming appointments, etc.  Non-urgent messages can be sent to your provider as well.   To learn more about what you can do with MyChart, go to [email or web address].   Other Instructions  GOALS:  BP 130/80 OR BELOW TRIGS 100 OR BELOW

## 2024-01-29 ENCOUNTER — Ambulatory Visit: Admitting: Cardiology

## 2024-01-30 ENCOUNTER — Ambulatory Visit: Admitting: Cardiology

## 2024-01-30 ENCOUNTER — Telehealth: Payer: Self-pay

## 2024-01-30 NOTE — Progress Notes (Unsigned)
 This encounter was created in error - please disregard.

## 2024-01-30 NOTE — Telephone Encounter (Signed)
 SABRA

## 2024-02-05 ENCOUNTER — Telehealth: Payer: Self-pay

## 2024-02-05 NOTE — Progress Notes (Unsigned)
 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  This is a 64yo male with a hx of CKD 2, DVT, GERD, HLD, HTN, PAF and PE. He has a  hx of severe OSA with an AHI of 33 events per hour.  He had oxygen desaturations as low as 82%.  His Epworth sleepiness scale was elevated at 18 prior to going on CPAP.  When I last saw him in 2020 he was on CPAP at 14cm H2O.  He was then lost to followup.  He is now referred back for Sleep Medicine consultation for treatment of OSA>  He is doing well with his PAP device.  He tells me that the heating unit has gone out and needs a new device but wants to talk about the [place] device.  He tolerates the nasal pillow mask and feels the pressure is adequate.  He says that he feels sometimes like he cannot take a deep breath especially with his travel device.  He feels rested in the am but does get sleepy during the day if he sits down and sits on a heating pad and will fall asleep.  He denies any significant mouth or nasal dryness or nasal congestion.  He does not think that he snores. An Epworth Sleepiness Scale score was calculated the office today and this endorsed at 13 arguing for some  residual daytime sleepiness. Patient denies any episodes of  restless legs, No hypnogognic hallucinations or cataplectic events.  He has bruxism and wears a mouthguard.  Past Medical History:  Diagnosis Date   Arthritis    Blood dyscrasia    lupus anticoagulant   Chronic kidney disease    stage 2   Diastolic dysfunction    Grade I per echo in 2/12   DVT (deep venous thrombosis) (HCC) 2000's   both legs in the calves; left  went all the way up to my groin (09/17/2012)   Dysrhythmia    GERD (gastroesophageal reflux disease)    History of bronchitis    Hypercoagulation syndrome    secondary to circulating lupus anticoagulant   Hyperlipidemia    Hypertension    Hypothyroidism    wiped out from taking amiodarone (09/17/2012)   Lyme disease    history of    Obesity (BMI 30-39.9) 07/17/2014   PAF (paroxysmal atrial fibrillation) (HCC)    no recurrence since Feb 2012   Pneumonia    PONV (postoperative nausea and vomiting)    likes scopolomanine patch   Pulmonary embolism (HCC) 09/17/2012   just today; 3 on the right; 2 on the left (09/17/2012)   Situational stress    Sleep apnea    uses CPAP    Past Surgical History:  Procedure Laterality Date   CARDIOVASCULAR STRESS TEST  07/17/2009   EF 59%   CARDIOVERSION     DISTAL BICEPS TENDON REPAIR Right 2011   INGUINAL HERNIA REPAIR Right 1992   INGUINAL HERNIA REPAIR Left 2013   INSERTION OF MESH N/A 05/14/2015   Procedure: INSERTION OF MESH;  Surgeon: Krystal Russell, MD;  Location: [facility];  Service: General;  Laterality: N/A;   IR RADIOLOGIST EVAL & MGMT  12/30/2022   RIGHT/LEFT HEART CATH AND CORONARY ANGIOGRAPHY N/A 09/13/2023   Procedure: RIGHT/LEFT HEART CATH AND CORONARY ANGIOGRAPHY;  Surgeon: Darron Deatrice LABOR, MD;  Location: [facility];  Service: Cardiovascular;  Laterality: N/A;   SHOULDER ARTHROSCOPY W/ ROTATOR CUFF REPAIR Left 09/04/2012   SHOULDER SURGERY     left   TOTAL HIP ARTHROPLASTY Right 11/26/2014   Procedure: RIGHT TOTAL HIP ARTHROPLASTY ANTERIOR APPROACH;  Surgeon: Dempsey Moan, MD;  Location: [facility];  Service: Orthopedics;  Laterality: Right;   TOTAL HIP ARTHROPLASTY Left 01/30/2019   Procedure: TOTAL HIP ARTHROPLASTY ANTERIOR APPROACH;  Surgeon: Moan Dempsey, MD;  Location: [facility];  Service: Orthopedics;  Laterality: Left;    TRANSTHORACIC ECHOCARDIOGRAM  03/05/2010   EF 60-65%   UMBILICAL HERNIA REPAIR N/A 05/14/2015    Procedure: HERNIA REPAIR UMBILICAL ADULT WITH MESH ;  Surgeon: Krystal Russell, MD;  Location: [facility];  Service: General;  Laterality: N/A;   WISDOM TOOTH EXTRACTION     lower 2    Current Medications: Active Medications[1]  Allergies:   Crestor [rosuvastatin calcium] and Lipitor [atorvastatin calcium]   Social History   Socioeconomic History   Marital status: Divorced    Spouse name: Not on file   Number of children: Not on file   Years of education: Not on file   Highest education level: Not on file  Occupational History   Not on file  Tobacco Use   Smoking status: Never   Smokeless tobacco: Never  Vaping Use   Vaping status: Never Used  Substance and Sexual Activity   Alcohol use: Yes    Alcohol/week: 4.0 - 8.0 standard drinks of alcohol    Types: 2 - 4 Cans of beer, 2 - 4 Shots of liquor per week    Comment: 09/17/2012 1-2 beers and 1-2 tequila on Fri and Sat nights   Drug use: No   Sexual activity: Yes  Other Topics Concern   Not on file  Social History Narrative   Not on file   Social Drivers of Health   Tobacco Use: Low Risk (02/06/2024)   Patient History    Smoking Tobacco Use: Never    Smokeless Tobacco Use: Never    Passive Exposure: Not on file  Financial Resource Strain: Low Risk (05/11/2021)   Received from [facility]   Overall Financial Resource Strain (CARDIA)    Difficulty of Paying Living Expenses: Not hard at all  Food Insecurity: No Food Insecurity (09/12/2023)   Epic    Worried About Radiation Protection Practitioner of Food in the Last Year: Never true    Ran Out of Food in the Last Year: Never true  Transportation Needs: No Transportation Needs (09/12/2023)   Epic    Lack of Transportation (Medical): No    Lack of Transportation (Non-Medical): No  Physical Activity: Insufficiently Active (05/11/2021)   Received from [facility]   Exercise Vital Sign    On average, how many days per week do you engage in moderate to strenuous exercise (like a brisk walk)?: 3  days    On average, how many minutes do you engage in exercise at this level?: 20 min  Stress: No Stress Concern Present (05/11/2021)   Received from [facility] - Occupational Stress Questionnaire    Feeling of Stress : Only a little  Social Connections: Unknown (05/30/2022)   Received from [facility]   Social Network    Social Network: Not on file  Depression [id number]): Not on file  Alcohol Screen: Not on file  Housing: Low Risk (09/12/2023)   Epic    Unable to Pay for Housing in the Last Year: No    Number of Times Moved in the Last Year: 0    Homeless in the Last Year: No  Utilities: Not At Risk (09/12/2023)   Epic    Threatened with loss of utilities: No  Health Literacy: Not on file     Family History:  The patient's family history includes Cancer in his brother and mother; Heart attack in his maternal grandfather; Hyperlipidemia in his mother; Hypertension in his brother, father, and mother.   ROS:   Please see the history of present illness.    ROS All other systems reviewed and are negative.     02/03/2024    8:59 AM 01/26/2024    8:56 PM  PAD Screen  Previous PAD dx? No  No   Previous surgical procedure? No  No   Pain with walking? No  No   Feet/toe relief with dangling? No  No   Painful, non-healing ulcers? No  No   Extremities discolored? No  No      Manually entered by patient       PHYSICAL EXAM:   VS:  BP 124/70   Pulse 74   Ht 6' 3 (1.905 m)   Wt 255 lb (115.7 kg)   SpO2 97%   BMI 31.87 kg/m    GEN: Well nourished, well developed, in no acute distress  HEENT: normal  Neck: no JVD, carotid bruits, or masses Cardiac: RRR; no murmurs, rubs, or gallops,no edema.  Intact distal pulses bilaterally.  Respiratory:  clear to auscultation bilaterally, normal work of breathing GI: soft, nontender, nondistended, + BS MS: no deformity or atrophy  Skin: warm and dry, no rash Neuro:  Alert and Oriented x 3,  Strength and sensation are intact Psych: euthymic mood, full affect  Wt Readings from Last 3 Encounters:  02/06/24 255 lb (115.7 kg)  01/09/24 260 lb 4.8 oz (118.1 kg)  09/11/23 255 lb 3.2 oz (115.8 kg)      Studies/Labs Reviewed:   PAP compliance download  Recent Labs: 09/12/2023: ALT 76; Platelets 183; TSH 1.499 09/13/2023: BUN 15; Creatinine, Ser 1.56; Hemoglobin 15.6; Potassium 4.0; Sodium 139    CHA2DS2-VASc Score = 3   This indicates a 3.2% annual risk of stroke. The patient's score is based upon: CHF History: 1 HTN History: 1 Diabetes History: 0 Stroke History: 0 Vascular Disease History: 1 Age Score: 0 Gender Score: 0    ASSESSMENT:    1. OSA (obstructive sleep apnea)   2. Essential hypertension      PLAN:  In order of problems listed above:  OSA - The patient is tolerating PAP therapy well without any problems. The PAP download performed by his DME was personally reviewed and interpreted by me today and showed an AHI of 0.5/hr on 14 cm H2O with 90% compliance in using more than 4 hours nightly.  The patient has been using and benefiting from PAP use and will continue to benefit from therapy.  -Will set up an in lab NPSG to get baseline to see if he qualifies  HTN -BP controlled on exam  -continue Amlodipine  5mg  daily, Lisinopril  20mg  daily and Toprol  XL 12.5mg  daily with PRN refills  Time Spent: 20 minutes total time of encounter, including 15 minutes spent in face-to-face patient care on the date of this encounter. This time includes coordination of care and counseling regarding above mentioned problem list. Remainder of non-face-to-face time involved reviewing chart documents/testing relevant to the patient encounter and documentation in the medical record. I have independently reviewed documentation from referring provider  Medication Adjustments/Labs and Tests Ordered: Current medicines are reviewed at length with the patient today.  Concerns regarding  medicines are outlined above.  Medication changes, Labs and Tests ordered today are listed in the Patient Instructions below.  There are no Patient Instructions on file for this visit.   Signed, Wilbert Bihari, MD  02/06/2024 2:36 PM    [facility] [address] [place], [place], [place]  [address] Phone: [phone]; Fax: [phone]      [1]  Current Meds  Medication Sig   ALPRAZolam  (XANAX ) 0.25 MG tablet Take 1 tablet by mouth 2 (two) times daily.   amLODipine  (NORVASC ) 5 MG tablet Take 1 tablet (5 mg total) by mouth daily.   Evolocumab  (REPATHA  SURECLICK) 140 MG/ML SOAJ Inject 140 mg as directed every 14 (fourteen) days.   lisinopril  (ZESTRIL ) 20 MG tablet Take 1 tablet (20 mg total) by mouth daily. (Patient taking differently: Take 10 mg by mouth daily.)   metoprolol  succinate (TOPROL -XL) 25 MG 24 hr tablet Take 0.5 tablets (12.5 mg total) by mouth daily.   omeprazole  (PRILOSEC) 20 MG capsule Take 1 capsule (20 mg total) by mouth daily.   propafenone  (RYTHMOL  SR) 325 MG 12 hr capsule Take 1 capsule (325 mg total) by mouth 2 (two) times daily.   rivaroxaban  (XARELTO ) 20 MG TABS tablet Take 1 tablet (20 mg total) by mouth daily.   SYNTHROID  150 MCG tablet Take 300 mcg by mouth daily.    tadalafil  (CIALIS ) 5 MG tablet Take 5 mg by mouth daily.   tamsulosin  (FLOMAX ) 0.4 MG CAPS capsule Take 0.4 mg by mouth every evening.    "

## 2024-02-05 NOTE — Telephone Encounter (Signed)
 Isaiah [name]

## 2024-02-06 ENCOUNTER — Ambulatory Visit: Attending: Cardiology | Admitting: Cardiology

## 2024-02-06 ENCOUNTER — Encounter: Payer: Self-pay | Admitting: Cardiology

## 2024-02-06 VITALS — BP 124/70 | HR 74 | Ht 75.0 in | Wt 255.0 lb

## 2024-02-06 DIAGNOSIS — I10 Essential (primary) hypertension: Secondary | ICD-10-CM | POA: Diagnosis not present

## 2024-02-06 DIAGNOSIS — G4733 Obstructive sleep apnea (adult) (pediatric): Secondary | ICD-10-CM | POA: Diagnosis not present

## 2024-02-06 NOTE — Patient Instructions (Signed)
 Medication Instructions:  Your physician recommends that you continue on your current medications as directed. Please refer to the Current Medication list given to you today.  *If you need a refill on your cardiac medications before your next appointment, please call your pharmacy*  Lab Work: None.  If you have labs (blood work) drawn today and your tests are completely normal, you will receive your results only by: MyChart Message (if you have MyChart) OR A paper copy in the mail If you have any lab test that is abnormal or we need to change your treatment, we will call you to review the results.  Testing/Procedures: Your physician has recommended that you have an in-lab sleep study. This test records several body functions during sleep, including: brain activity, eye movement, oxygen and carbon dioxide blood levels, heart rate and rhythm, breathing rate and rhythm, the flow of air through your mouth and nose, snoring, body muscle movements, and chest and belly movement.   Follow-Up: At [facility], you and your health needs are our priority.  As part of our continuing mission to provide you with exceptional heart care, our providers are all part of one team.  This team includes your primary Cardiologist (physician) and Advanced Practice Providers or APPs (Physician Assistants and Nurse Practitioners) who all work together to provide you with the care you need, when you need it.  Your next appointment will be dependent on the results of your sleep study and it will be with:    Provider:   Dr. Wilbert Bihari, MD

## 2024-02-06 NOTE — Addendum Note (Signed)
 Addended by: JANIT GENI CROME on: 02/06/2024 02:58 PM   Modules accepted: Orders
